# Patient Record
Sex: Female | Born: 1945
Health system: Southern US, Community
[De-identification: ages and names within clinical notes are randomized; demographics above are authoritative.]

## PROBLEM LIST (undated history)

## (undated) DIAGNOSIS — F329 Major depressive disorder, single episode, unspecified: Secondary | ICD-10-CM

## (undated) DIAGNOSIS — E785 Hyperlipidemia, unspecified: Secondary | ICD-10-CM

## (undated) DIAGNOSIS — I10 Essential (primary) hypertension: Secondary | ICD-10-CM

## (undated) DIAGNOSIS — Z8639 Personal history of other endocrine, nutritional and metabolic disease: Secondary | ICD-10-CM

## (undated) DIAGNOSIS — G620 Drug-induced polyneuropathy: Secondary | ICD-10-CM

## (undated) DIAGNOSIS — F419 Anxiety disorder, unspecified: Secondary | ICD-10-CM

## (undated) DIAGNOSIS — C50919 Malignant neoplasm of unspecified site of unspecified female breast: Secondary | ICD-10-CM

## (undated) DIAGNOSIS — C801 Malignant (primary) neoplasm, unspecified: Secondary | ICD-10-CM

## (undated) DIAGNOSIS — I252 Old myocardial infarction: Secondary | ICD-10-CM

## (undated) DIAGNOSIS — T451X5A Adverse effect of antineoplastic and immunosuppressive drugs, initial encounter: Secondary | ICD-10-CM

## (undated) DIAGNOSIS — Z9221 Personal history of antineoplastic chemotherapy: Secondary | ICD-10-CM

## (undated) DIAGNOSIS — I2510 Atherosclerotic heart disease of native coronary artery without angina pectoris: Secondary | ICD-10-CM

## (undated) DIAGNOSIS — Z923 Personal history of irradiation: Secondary | ICD-10-CM

## (undated) DIAGNOSIS — R739 Hyperglycemia, unspecified: Secondary | ICD-10-CM

## (undated) DIAGNOSIS — F32A Depression, unspecified: Secondary | ICD-10-CM

## (undated) HISTORY — DX: Personal history of other endocrine, nutritional and metabolic disease: Z86.39

## (undated) HISTORY — DX: Adverse effect of antineoplastic and immunosuppressive drugs, initial encounter: T45.1X5A

## (undated) HISTORY — DX: Old myocardial infarction: I25.2

## (undated) HISTORY — PX: ABDOMINAL HYSTERECTOMY: SHX81

## (undated) HISTORY — DX: Depression, unspecified: F32.A

## (undated) HISTORY — DX: Hyperglycemia, unspecified: R73.9

## (undated) HISTORY — DX: Drug-induced polyneuropathy: G62.0

## (undated) HISTORY — PX: CHOLECYSTECTOMY: SHX55

## (undated) HISTORY — DX: Anxiety disorder, unspecified: F41.9

## (undated) HISTORY — DX: Malignant neoplasm of unspecified site of unspecified female breast: C50.919

## (undated) HISTORY — DX: Personal history of antineoplastic chemotherapy: Z92.21

## (undated) HISTORY — DX: Essential (primary) hypertension: I10

## (undated) HISTORY — DX: Malignant (primary) neoplasm, unspecified: C80.1

## (undated) HISTORY — DX: Major depressive disorder, single episode, unspecified: F32.9

## (undated) HISTORY — DX: Hyperlipidemia, unspecified: E78.5

## (undated) HISTORY — DX: Atherosclerotic heart disease of native coronary artery without angina pectoris: I25.10

---

## 1999-07-28 DIAGNOSIS — Z923 Personal history of irradiation: Secondary | ICD-10-CM

## 1999-07-28 HISTORY — DX: Personal history of irradiation: Z92.3

## 2006-07-27 HISTORY — PX: CORONARY ANGIOPLASTY WITH STENT PLACEMENT: SHX49

## 2006-07-27 HISTORY — PX: BREAST BIOPSY: SHX20

## 2006-09-21 ENCOUNTER — Other Ambulatory Visit: Payer: Self-pay

## 2006-09-21 ENCOUNTER — Inpatient Hospital Stay: Payer: Self-pay | Admitting: Internal Medicine

## 2006-09-22 ENCOUNTER — Other Ambulatory Visit: Payer: Self-pay

## 2009-07-27 DIAGNOSIS — Z9221 Personal history of antineoplastic chemotherapy: Secondary | ICD-10-CM

## 2009-07-27 HISTORY — DX: Personal history of antineoplastic chemotherapy: Z92.21

## 2010-02-24 ENCOUNTER — Ambulatory Visit: Payer: Self-pay | Admitting: Internal Medicine

## 2010-03-05 ENCOUNTER — Ambulatory Visit: Payer: Self-pay | Admitting: Family Medicine

## 2010-03-05 HISTORY — PX: BREAST BIOPSY: SHX20

## 2010-03-21 ENCOUNTER — Ambulatory Visit: Payer: Self-pay | Admitting: Internal Medicine

## 2010-03-22 LAB — CANCER ANTIGEN 27.29: CA 27.29: 15.4 U/mL (ref 0.0–38.6)

## 2010-03-25 ENCOUNTER — Ambulatory Visit: Payer: Self-pay | Admitting: Surgery

## 2010-03-25 ENCOUNTER — Ambulatory Visit: Payer: Self-pay | Admitting: Internal Medicine

## 2010-03-26 ENCOUNTER — Ambulatory Visit: Payer: Self-pay | Admitting: Surgery

## 2010-03-27 ENCOUNTER — Ambulatory Visit: Payer: Self-pay | Admitting: Internal Medicine

## 2010-04-26 ENCOUNTER — Ambulatory Visit: Payer: Self-pay | Admitting: Internal Medicine

## 2010-05-27 ENCOUNTER — Ambulatory Visit: Payer: Self-pay | Admitting: Internal Medicine

## 2010-06-26 ENCOUNTER — Ambulatory Visit: Payer: Self-pay | Admitting: Internal Medicine

## 2010-07-07 ENCOUNTER — Ambulatory Visit: Payer: Self-pay | Admitting: Surgery

## 2010-07-14 ENCOUNTER — Ambulatory Visit: Payer: Self-pay | Admitting: Surgery

## 2010-07-14 HISTORY — PX: BREAST LUMPECTOMY: SHX2

## 2010-07-23 LAB — PATHOLOGY REPORT

## 2010-07-27 ENCOUNTER — Ambulatory Visit: Payer: Self-pay | Admitting: Internal Medicine

## 2010-08-27 ENCOUNTER — Ambulatory Visit: Payer: Self-pay | Admitting: Internal Medicine

## 2010-09-25 ENCOUNTER — Ambulatory Visit: Payer: Self-pay | Admitting: Internal Medicine

## 2010-10-26 ENCOUNTER — Ambulatory Visit: Payer: Self-pay | Admitting: Internal Medicine

## 2010-11-25 ENCOUNTER — Ambulatory Visit: Payer: Self-pay | Admitting: Internal Medicine

## 2010-12-26 ENCOUNTER — Ambulatory Visit: Payer: Self-pay | Admitting: Internal Medicine

## 2011-01-27 ENCOUNTER — Ambulatory Visit: Payer: Self-pay | Admitting: Internal Medicine

## 2011-02-25 ENCOUNTER — Ambulatory Visit: Payer: Self-pay | Admitting: Internal Medicine

## 2011-03-28 ENCOUNTER — Ambulatory Visit: Payer: Self-pay | Admitting: Internal Medicine

## 2011-04-27 ENCOUNTER — Ambulatory Visit: Payer: Self-pay | Admitting: Internal Medicine

## 2011-06-02 ENCOUNTER — Ambulatory Visit: Payer: Self-pay | Admitting: Internal Medicine

## 2011-06-27 ENCOUNTER — Ambulatory Visit: Payer: Self-pay | Admitting: Internal Medicine

## 2011-07-28 ENCOUNTER — Ambulatory Visit: Payer: Self-pay | Admitting: Internal Medicine

## 2011-08-28 ENCOUNTER — Ambulatory Visit: Payer: Self-pay | Admitting: Internal Medicine

## 2011-09-28 ENCOUNTER — Ambulatory Visit: Payer: Self-pay | Admitting: Internal Medicine

## 2011-10-26 ENCOUNTER — Ambulatory Visit: Payer: Self-pay | Admitting: Internal Medicine

## 2011-11-16 LAB — HEPATIC FUNCTION PANEL A (ARMC)
Albumin: 3.5 g/dL (ref 3.4–5.0)
Alkaline Phosphatase: 109 U/L (ref 50–136)
Bilirubin,Total: 0.2 mg/dL (ref 0.2–1.0)
Total Protein: 7.1 g/dL (ref 6.4–8.2)

## 2011-11-16 LAB — CBC CANCER CENTER
Basophil %: 1.2 %
HGB: 12.5 g/dL (ref 12.0–16.0)
MCHC: 32.7 g/dL (ref 32.0–36.0)
Neutrophil %: 51.2 %
Platelet: 243 x10 3/mm (ref 150–440)
RBC: 4.08 10*6/uL (ref 3.80–5.20)
WBC: 5.7 x10 3/mm (ref 3.6–11.0)

## 2011-11-16 LAB — CREATININE, SERUM
Creatinine: 0.86 mg/dL (ref 0.60–1.30)
EGFR (African American): >60
EGFR (Non-African Amer.): >60

## 2011-11-17 LAB — CANCER ANTIGEN 27.29: CA 27.29: 22.9 U/mL (ref 0.0–38.6)

## 2011-11-25 ENCOUNTER — Ambulatory Visit: Payer: Self-pay | Admitting: Internal Medicine

## 2012-03-14 ENCOUNTER — Ambulatory Visit: Payer: Self-pay | Admitting: Internal Medicine

## 2012-03-27 ENCOUNTER — Ambulatory Visit: Payer: Self-pay | Admitting: Internal Medicine

## 2012-04-07 ENCOUNTER — Ambulatory Visit: Payer: Self-pay | Admitting: Internal Medicine

## 2012-04-12 ENCOUNTER — Ambulatory Visit: Payer: Self-pay | Admitting: Internal Medicine

## 2012-04-12 LAB — CBC CANCER CENTER
Basophil #: 0.1 x10 3/mm (ref 0.0–0.1)
Basophil %: 1.1 %
Eosinophil #: 0.2 x10 3/mm (ref 0.0–0.7)
HCT: 39.6 % (ref 35.0–47.0)
HGB: 12.6 g/dL (ref 12.0–16.0)
Lymphocyte %: 29.3 %
MCH: 29.9 pg (ref 26.0–34.0)
MCHC: 31.8 g/dL — ABNORMAL LOW (ref 32.0–36.0)
Monocyte #: 0.4 x10 3/mm (ref 0.2–0.9)
Neutrophil #: 3.7 x10 3/mm (ref 1.4–6.5)
Neutrophil %: 59.8 %
Platelet: 290 x10 3/mm (ref 150–440)
WBC: 6.2 x10 3/mm (ref 3.6–11.0)

## 2012-04-12 LAB — HEPATIC FUNCTION PANEL A (ARMC)
Albumin: 3.6 g/dL (ref 3.4–5.0)
Alkaline Phosphatase: 124 U/L (ref 50–136)
Bilirubin, Direct: 0.1 mg/dL (ref 0.00–0.20)
Bilirubin,Total: 0.3 mg/dL (ref 0.2–1.0)
SGOT(AST): 16 U/L (ref 15–37)
SGPT (ALT): 21 U/L (ref 12–78)
Total Protein: 7.9 g/dL (ref 6.4–8.2)

## 2012-04-12 LAB — CREATININE, SERUM
Creatinine: 0.87 mg/dL (ref 0.60–1.30)
EGFR (African American): >60
EGFR (Non-African Amer.): >60

## 2012-04-13 LAB — CANCER ANTIGEN 27.29: CA 27.29: 31.2 U/mL (ref 0.0–38.6)

## 2012-04-26 ENCOUNTER — Ambulatory Visit: Payer: Self-pay | Admitting: Internal Medicine

## 2012-05-19 ENCOUNTER — Ambulatory Visit: Payer: Self-pay | Admitting: Gastroenterology

## 2012-07-27 ENCOUNTER — Ambulatory Visit: Payer: Self-pay | Admitting: Internal Medicine

## 2012-08-12 LAB — CREATININE, SERUM: EGFR (Non-African Amer.): >60

## 2012-08-12 LAB — HEPATIC FUNCTION PANEL A (ARMC)
Albumin: 3.4 g/dL (ref 3.4–5.0)
Alkaline Phosphatase: 142 U/L — ABNORMAL HIGH (ref 50–136)
Bilirubin,Total: 0.2 mg/dL (ref 0.2–1.0)
SGOT(AST): 16 U/L (ref 15–37)
SGPT (ALT): 19 U/L (ref 12–78)

## 2012-08-12 LAB — CBC CANCER CENTER
Eosinophil #: 0.3 x10 3/mm (ref 0.0–0.7)
HCT: 41.1 % (ref 35.0–47.0)
HGB: 13.9 g/dL (ref 12.0–16.0)
Lymphocyte %: 31.4 %
MCH: 30.6 pg (ref 26.0–34.0)
MCHC: 33.8 g/dL (ref 32.0–36.0)
MCV: 91 fL (ref 80–100)
Monocyte %: 7.1 %
Neutrophil #: 4 x10 3/mm (ref 1.4–6.5)
Neutrophil %: 56.8 %
Platelet: 257 x10 3/mm (ref 150–440)
RDW: 14.3 % (ref 11.5–14.5)

## 2012-08-13 LAB — CANCER ANTIGEN 27.29: CA 27.29: 22.4 U/mL (ref 0.0–38.6)

## 2012-08-27 ENCOUNTER — Ambulatory Visit: Payer: Self-pay | Admitting: Internal Medicine

## 2012-10-19 ENCOUNTER — Ambulatory Visit: Payer: Self-pay | Admitting: Internal Medicine

## 2013-02-24 ENCOUNTER — Ambulatory Visit: Payer: Self-pay | Admitting: Internal Medicine

## 2013-03-01 LAB — CBC CANCER CENTER
Basophil #: 0.1 x10 3/mm (ref 0.0–0.1)
Basophil %: 1.2 %
Eosinophil %: 2.7 %
HCT: 42 % (ref 35.0–47.0)
Lymphocyte %: 36 %
MCH: 30.9 pg (ref 26.0–34.0)
MCV: 92 fL (ref 80–100)
Monocyte #: 0.6 x10 3/mm (ref 0.2–0.9)
Neutrophil #: 4.2 x10 3/mm (ref 1.4–6.5)
Platelet: 273 x10 3/mm (ref 150–440)
RBC: 4.58 10*6/uL (ref 3.80–5.20)

## 2013-03-01 LAB — GLUCOSE, RANDOM: Glucose: 134 mg/dL — ABNORMAL HIGH (ref 65–99)

## 2013-03-01 LAB — HEPATIC FUNCTION PANEL A (ARMC)
Bilirubin, Direct: 0.1 mg/dL (ref 0.00–0.20)
Bilirubin,Total: 0.3 mg/dL (ref 0.2–1.0)
SGOT(AST): 13 U/L — ABNORMAL LOW (ref 15–37)
Total Protein: 7.7 g/dL (ref 6.4–8.2)

## 2013-03-01 LAB — CREATININE, SERUM
Creatinine: 1.4 mg/dL — ABNORMAL HIGH (ref 0.60–1.30)
EGFR (Non-African Amer.): 39 — ABNORMAL LOW

## 2013-03-27 ENCOUNTER — Ambulatory Visit: Payer: Self-pay | Admitting: Internal Medicine

## 2013-04-26 ENCOUNTER — Ambulatory Visit: Payer: Self-pay | Admitting: Internal Medicine

## 2013-08-31 ENCOUNTER — Ambulatory Visit: Payer: Self-pay | Admitting: Internal Medicine

## 2013-09-01 LAB — HEPATIC FUNCTION PANEL A (ARMC)
ALBUMIN: 3.6 g/dL (ref 3.4–5.0)
ALK PHOS: 112 U/L
ALT: 16 U/L (ref 12–78)
Bilirubin, Direct: 0.1 mg/dL (ref 0.00–0.20)
Bilirubin,Total: 0.2 mg/dL (ref 0.2–1.0)
SGOT(AST): 12 U/L — ABNORMAL LOW (ref 15–37)
TOTAL PROTEIN: 7.6 g/dL (ref 6.4–8.2)

## 2013-09-01 LAB — CBC CANCER CENTER
BASOS ABS: 0.1 x10 3/mm (ref 0.0–0.1)
Basophil %: 1.1 %
EOS ABS: 0.4 x10 3/mm (ref 0.0–0.7)
Eosinophil %: 4.7 %
HCT: 42.1 % (ref 35.0–47.0)
HGB: 13.7 g/dL (ref 12.0–16.0)
LYMPHS PCT: 31.1 %
Lymphocyte #: 2.5 x10 3/mm (ref 1.0–3.6)
MCH: 30.3 pg (ref 26.0–34.0)
MCHC: 32.6 g/dL (ref 32.0–36.0)
MCV: 93 fL (ref 80–100)
MONO ABS: 0.5 x10 3/mm (ref 0.2–0.9)
MONOS PCT: 6.8 %
NEUTROS PCT: 56.3 %
Neutrophil #: 4.5 x10 3/mm (ref 1.4–6.5)
Platelet: 270 x10 3/mm (ref 150–440)
RBC: 4.53 10*6/uL (ref 3.80–5.20)
RDW: 14.3 % (ref 11.5–14.5)
WBC: 8.1 x10 3/mm (ref 3.6–11.0)

## 2013-09-01 LAB — CREATININE, SERUM
CREATININE: 1.04 mg/dL (ref 0.60–1.30)
EGFR (African American): >60
EGFR (Non-African Amer.): 56 — ABNORMAL LOW

## 2013-09-02 LAB — CANCER ANTIGEN 27.29: CA 27.29: 25.4 U/mL (ref 0.0–38.6)

## 2013-09-24 ENCOUNTER — Ambulatory Visit: Payer: Self-pay | Admitting: Internal Medicine

## 2013-10-25 ENCOUNTER — Ambulatory Visit: Payer: Self-pay | Admitting: Internal Medicine

## 2014-03-02 ENCOUNTER — Ambulatory Visit: Payer: Self-pay | Admitting: Internal Medicine

## 2014-03-02 LAB — CBC CANCER CENTER
Basophil #: 0.1 x10 3/mm (ref 0.0–0.1)
Basophil %: 0.8 %
EOS ABS: 0.2 x10 3/mm (ref 0.0–0.7)
Eosinophil %: 3.1 %
HCT: 39.7 % (ref 35.0–47.0)
HGB: 13.2 g/dL (ref 12.0–16.0)
LYMPHS ABS: 2.4 x10 3/mm (ref 1.0–3.6)
LYMPHS PCT: 32 %
MCH: 31.2 pg (ref 26.0–34.0)
MCHC: 33.4 g/dL (ref 32.0–36.0)
MCV: 94 fL (ref 80–100)
MONO ABS: 0.5 x10 3/mm (ref 0.2–0.9)
MONOS PCT: 6.3 %
NEUTROS ABS: 4.3 x10 3/mm (ref 1.4–6.5)
NEUTROS PCT: 57.8 %
PLATELETS: 269 x10 3/mm (ref 150–440)
RBC: 4.24 10*6/uL (ref 3.80–5.20)
RDW: 13.4 % (ref 11.5–14.5)
WBC: 7.4 x10 3/mm (ref 3.6–11.0)

## 2014-03-02 LAB — HEPATIC FUNCTION PANEL A (ARMC)
ALK PHOS: 76 U/L
AST: 13 U/L — AB (ref 15–37)
Albumin: 3.5 g/dL (ref 3.4–5.0)
BILIRUBIN TOTAL: 0.2 mg/dL (ref 0.2–1.0)
Bilirubin, Direct: <0.1 mg/dL (ref 0.00–0.20)
SGPT (ALT): 21 U/L
Total Protein: 7.5 g/dL (ref 6.4–8.2)

## 2014-03-02 LAB — CREATININE, SERUM
Creatinine: 1.02 mg/dL (ref 0.60–1.30)
EGFR (African American): >60
GFR CALC NON AF AMER: 57 — AB

## 2014-03-06 LAB — CANCER ANTIGEN 27.29: CA 27.29: 13 U/mL (ref 0.0–38.6)

## 2014-03-27 ENCOUNTER — Ambulatory Visit: Payer: Self-pay | Admitting: Internal Medicine

## 2014-04-25 DIAGNOSIS — D649 Anemia, unspecified: Secondary | ICD-10-CM | POA: Insufficient documentation

## 2014-04-25 DIAGNOSIS — I10 Essential (primary) hypertension: Secondary | ICD-10-CM | POA: Insufficient documentation

## 2014-10-29 ENCOUNTER — Ambulatory Visit
Admit: 2014-10-29 | Disposition: A | Discharge status: Home / Self Care | Payer: Self-pay | Attending: Internal Medicine | Admitting: Internal Medicine

## 2014-10-29 DIAGNOSIS — G8929 Other chronic pain: Secondary | ICD-10-CM | POA: Insufficient documentation

## 2014-10-29 DIAGNOSIS — IMO0002 Reserved for concepts with insufficient information to code with codable children: Secondary | ICD-10-CM | POA: Insufficient documentation

## 2014-10-29 DIAGNOSIS — I10 Essential (primary) hypertension: Secondary | ICD-10-CM | POA: Insufficient documentation

## 2014-10-29 DIAGNOSIS — Z9181 History of falling: Secondary | ICD-10-CM | POA: Insufficient documentation

## 2014-10-29 DIAGNOSIS — R739 Hyperglycemia, unspecified: Secondary | ICD-10-CM | POA: Insufficient documentation

## 2014-10-29 DIAGNOSIS — D492 Neoplasm of unspecified behavior of bone, soft tissue, and skin: Secondary | ICD-10-CM | POA: Insufficient documentation

## 2014-10-29 DIAGNOSIS — G64 Other disorders of peripheral nervous system: Secondary | ICD-10-CM | POA: Insufficient documentation

## 2014-10-29 DIAGNOSIS — Z1331 Encounter for screening for depression: Secondary | ICD-10-CM | POA: Insufficient documentation

## 2014-10-29 DIAGNOSIS — R7303 Prediabetes: Secondary | ICD-10-CM | POA: Insufficient documentation

## 2014-10-29 DIAGNOSIS — F419 Anxiety disorder, unspecified: Secondary | ICD-10-CM | POA: Insufficient documentation

## 2014-10-29 DIAGNOSIS — C50919 Malignant neoplasm of unspecified site of unspecified female breast: Secondary | ICD-10-CM | POA: Insufficient documentation

## 2014-10-29 DIAGNOSIS — I2510 Atherosclerotic heart disease of native coronary artery without angina pectoris: Secondary | ICD-10-CM | POA: Insufficient documentation

## 2014-10-29 DIAGNOSIS — E785 Hyperlipidemia, unspecified: Secondary | ICD-10-CM | POA: Insufficient documentation

## 2014-10-29 DIAGNOSIS — R946 Abnormal results of thyroid function studies: Secondary | ICD-10-CM | POA: Insufficient documentation

## 2014-10-29 DIAGNOSIS — M542 Cervicalgia: Secondary | ICD-10-CM

## 2014-12-13 ENCOUNTER — Other Ambulatory Visit: Payer: Self-pay | Admitting: *Deleted

## 2014-12-13 NOTE — Telephone Encounter (Signed)
Will pick up 12/14/14

## 2014-12-14 MED ORDER — HYDROCODONE-ACETAMINOPHEN 5-325 MG PO TABS: 1.0000 | ORAL_TABLET | Freq: Every evening | ORAL | Status: DC | PRN

## 2014-12-27 ENCOUNTER — Encounter (INDEPENDENT_AMBULATORY_CARE_PROVIDER_SITE_OTHER): Payer: Self-pay

## 2014-12-27 ENCOUNTER — Ambulatory Visit (INDEPENDENT_AMBULATORY_CARE_PROVIDER_SITE_OTHER): Payer: Medicare Other | Admitting: Family Medicine

## 2014-12-27 ENCOUNTER — Encounter: Payer: Self-pay | Admitting: Family Medicine

## 2014-12-27 VITALS — BP 130/80 | HR 78 | Resp 15 | Ht 64.0 in | Wt 201.2 lb

## 2014-12-27 DIAGNOSIS — R7309 Other abnormal glucose: Secondary | ICD-10-CM | POA: Diagnosis not present

## 2014-12-27 DIAGNOSIS — F411 Generalized anxiety disorder: Secondary | ICD-10-CM

## 2014-12-27 DIAGNOSIS — T502X5A Adverse effect of carbonic-anhydrase inhibitors, benzothiadiazides and other diuretics, initial encounter: Secondary | ICD-10-CM | POA: Diagnosis not present

## 2014-12-27 DIAGNOSIS — E876 Hypokalemia: Secondary | ICD-10-CM | POA: Diagnosis not present

## 2014-12-27 DIAGNOSIS — I10 Essential (primary) hypertension: Secondary | ICD-10-CM

## 2014-12-27 MED ORDER — LOSARTAN POTASSIUM 100 MG PO TABS: 100.0000 mg | ORAL_TABLET | Freq: Every day | ORAL | Status: DC

## 2014-12-27 MED ORDER — AMLODIPINE BESYLATE 10 MG PO TABS: 10.0000 mg | ORAL_TABLET | Freq: Every day | ORAL | Status: DC

## 2014-12-27 MED ORDER — TRIAMTERENE-HCTZ 37.5-25 MG PO TABS: 1.0000 | ORAL_TABLET | Freq: Every day | ORAL | Status: DC

## 2014-12-27 MED ORDER — PAROXETINE HCL 20 MG PO TABS: 20.0000 mg | ORAL_TABLET | Freq: Every day | ORAL | Status: DC

## 2014-12-27 MED ORDER — POTASSIUM CHLORIDE CRYS ER 20 MEQ PO TBCR: 20.0000 meq | EXTENDED_RELEASE_TABLET | Freq: Every day | ORAL | Status: DC

## 2014-12-27 NOTE — Progress Notes (Signed)
Subjective:    Patient ID: Theresa Andrews, female    DOB: [birth year], 69 y.o.   MRN: [id number]  Hypertension This is a chronic problem. The current episode started more than 1 year ago. The problem is controlled. Associated symptoms include anxiety. Pertinent negatives include no blurred vision, chest pain, headaches, orthopnea, palpitations or shortness of breath. Risk factors for coronary artery disease include diabetes mellitus and dyslipidemia. Past treatments include angiotensin blockers, calcium channel blockers and diuretics. The current treatment provides significant improvement. There are no compliance problems.  Hypertensive end-organ damage includes CAD/MI. There is no history of CVA or heart failure.  Anxiety Onset was 1 to 5 years ago. The problem has been gradually improving. Symptoms include excessive worry, nervous/anxious behavior and panic. Patient reports no chest pain, depressed mood, dizziness, palpitations or shortness of breath. Symptoms occur occasionally. The severity of symptoms is moderate. The quality of sleep is good.   Past treatments include SSRIs and benzodiazephines. Compliance with medications is 76-100%.   Past Medical History  Diagnosis Date  . Depression   . Hyperlipidemia   . Hypertension   . Anxiety    Past Surgical History  Procedure Laterality Date  . Cholecystectomy    . Abdominal hysterectomy    . Coronary angioplasty with stent placement  2008   Family History  Problem Relation Age of Onset  . Cancer Sister     Breast Cancer  . Hypertension Mother   . Hypertension Father    History   Social History  . Marital Status: Divorced    Spouse Name: N/A  . Number of Children: N/A  . Years of Education: N/A   Occupational History  . Not on file.   Social History Main Topics  . Smoking status: Former Research scientist (life sciences)  . Smokeless tobacco: Not on file  . Alcohol Use: No  . Drug Use: No  . Sexual Activity: Not on file   Other Topics Concern  .  Not on file   Social History Narrative     Filed Vitals:   12/27/14 1015  BP: 130/80  Pulse: 78  Resp: 15     Review of Systems  Constitutional: Negative for fever, chills and fatigue.  Eyes: Negative for blurred vision.  Respiratory: Negative for shortness of breath.   Cardiovascular: Negative for chest pain, palpitations and orthopnea.  Neurological: Negative for dizziness and headaches.  Psychiatric/Behavioral: Negative for sleep disturbance. The patient is nervous/anxious.        Objective:   Physical Exam  Constitutional: She is oriented to person, place, and time. She appears well-developed and well-nourished.  Cardiovascular: Normal rate and regular rhythm.   Pulmonary/Chest: Effort normal.  Neurological: She is alert and oriented to person, place, and time.  Psychiatric: She has a normal mood and affect. Her behavior is normal. Judgment and thought content normal.  Nursing note and vitals reviewed.         Assessment & Plan:  1. Essential hypertension  - losartan (COZAAR) 100 MG tablet; Take 1 tablet (100 mg total) by mouth daily.  Dispense: 90 tablet; Refill: 0 - amLODipine (NORVASC) 10 MG tablet; Take 1 tablet (10 mg total) by mouth daily.  Dispense: 90 tablet; Refill: 0 - triamterene-hydrochlorothiazide (MAXZIDE-25) 37.5-25 MG per tablet; Take 1 tablet by mouth daily. 37.5-25  Dispense: 90 tablet; Refill: 0  2. Elevated hemoglobin A1c measurement  - HgB A1c  3. Diuretic-induced hypokalemia  - Basic Metabolic Panel (BMET) - potassium chloride SA (K-DUR,KLOR-CON) 20 MEQ  tablet; Take 1 tablet (20 mEq total) by mouth daily.  Dispense: 90 tablet; Refill: 0  4. GAD (generalized anxiety disorder)  - PARoxetine (PAXIL) 20 MG tablet; Take 1 tablet (20 mg total) by mouth daily.

## 2015-01-29 ENCOUNTER — Other Ambulatory Visit: Payer: Self-pay | Admitting: *Deleted

## 2015-01-29 MED ORDER — HYDROCODONE-ACETAMINOPHEN 5-325 MG PO TABS: 1.0000 | ORAL_TABLET | Freq: Every evening | ORAL | Status: DC | PRN

## 2015-02-25 ENCOUNTER — Other Ambulatory Visit: Payer: Self-pay | Admitting: Family Medicine

## 2015-02-25 DIAGNOSIS — F411 Generalized anxiety disorder: Secondary | ICD-10-CM

## 2015-02-25 DIAGNOSIS — I10 Essential (primary) hypertension: Secondary | ICD-10-CM

## 2015-02-25 NOTE — Telephone Encounter (Signed)
Patient is requesting a refill on Paroxetine 20mg  and Triant/HCTZ 37.5/25mg . Please send to [facility]. Patient is completely out

## 2015-02-26 NOTE — Telephone Encounter (Signed)
Medication has been refilled.

## 2015-02-27 ENCOUNTER — Telehealth: Payer: Self-pay | Admitting: Family Medicine

## 2015-02-27 DIAGNOSIS — F411 Generalized anxiety disorder: Secondary | ICD-10-CM

## 2015-02-27 DIAGNOSIS — I10 Essential (primary) hypertension: Secondary | ICD-10-CM

## 2015-02-27 MED ORDER — TRIAMTERENE-HCTZ 37.5-25 MG PO TABS: 1.0000 | ORAL_TABLET | Freq: Every day | ORAL | Status: DC

## 2015-02-27 MED ORDER — PAROXETINE HCL 20 MG PO TABS: 20.0000 mg | ORAL_TABLET | Freq: Every day | ORAL | Status: DC

## 2015-02-27 MED ORDER — PAROXETINE HCL 20 MG PO TABS
20.0000 mg | ORAL_TABLET | Freq: Every day | ORAL | Status: DC
Start: 2015-02-27 — End: 2015-04-29

## 2015-02-27 NOTE — Telephone Encounter (Signed)
Medication has been refilled and sent to [facility] [address]

## 2015-03-07 ENCOUNTER — Other Ambulatory Visit: Payer: Self-pay | Admitting: *Deleted

## 2015-03-07 DIAGNOSIS — C50912 Malignant neoplasm of unspecified site of left female breast: Secondary | ICD-10-CM

## 2015-03-08 ENCOUNTER — Inpatient Hospital Stay: Payer: Medicare Other | Attending: Internal Medicine

## 2015-03-08 ENCOUNTER — Inpatient Hospital Stay: Payer: Medicare Other | Admitting: Internal Medicine

## 2015-03-25 ENCOUNTER — Telehealth: Payer: Self-pay | Admitting: *Deleted

## 2015-03-25 ENCOUNTER — Other Ambulatory Visit: Payer: Self-pay | Admitting: Internal Medicine

## 2015-03-25 DIAGNOSIS — C50912 Malignant neoplasm of unspecified site of left female breast: Secondary | ICD-10-CM

## 2015-03-25 MED ORDER — HYDROCODONE-ACETAMINOPHEN 5-325 MG PO TABS: 1.0000 | ORAL_TABLET | Freq: Every evening | ORAL | Status: DC | PRN

## 2015-03-25 NOTE — Telephone Encounter (Signed)
Hydrocodone/-acetaminophen 5/235 RX renewed by Dr. Ma Hillock. Left msgs-notified pt that RX is ready to be picked up at [facility]. Pt or caregiver must bring photo id to pick up rx.

## 2015-03-25 NOTE — Telephone Encounter (Signed)
Dr. Ma Hillock, pt Left msg requesting RF on hydrocodone. Last filled 01/29/15.  May I get an order to rf this. Thanks.

## 2015-03-29 ENCOUNTER — Inpatient Hospital Stay (HOSPITAL_BASED_OUTPATIENT_CLINIC_OR_DEPARTMENT_OTHER): Payer: Medicare Other | Admitting: Internal Medicine

## 2015-03-29 ENCOUNTER — Inpatient Hospital Stay: Payer: Medicare Other | Attending: Internal Medicine

## 2015-03-29 ENCOUNTER — Other Ambulatory Visit: Payer: Self-pay | Admitting: *Deleted

## 2015-03-29 VITALS — BP 134/85 | HR 79 | Temp 96.8°F | Resp 18 | Ht 64.0 in | Wt 199.1 lb

## 2015-03-29 DIAGNOSIS — E785 Hyperlipidemia, unspecified: Secondary | ICD-10-CM

## 2015-03-29 DIAGNOSIS — I252 Old myocardial infarction: Secondary | ICD-10-CM | POA: Diagnosis not present

## 2015-03-29 DIAGNOSIS — C50912 Malignant neoplasm of unspecified site of left female breast: Secondary | ICD-10-CM

## 2015-03-29 DIAGNOSIS — C50812 Malignant neoplasm of overlapping sites of left female breast: Secondary | ICD-10-CM | POA: Insufficient documentation

## 2015-03-29 DIAGNOSIS — I10 Essential (primary) hypertension: Secondary | ICD-10-CM | POA: Diagnosis not present

## 2015-03-29 DIAGNOSIS — Z170 Estrogen receptor positive status [ER+]: Secondary | ICD-10-CM | POA: Diagnosis not present

## 2015-03-29 DIAGNOSIS — Z95818 Presence of other cardiac implants and grafts: Secondary | ICD-10-CM | POA: Insufficient documentation

## 2015-03-29 DIAGNOSIS — Z79810 Long term (current) use of selective estrogen receptor modulators (SERMs): Secondary | ICD-10-CM

## 2015-03-29 DIAGNOSIS — Z79899 Other long term (current) drug therapy: Secondary | ICD-10-CM | POA: Insufficient documentation

## 2015-03-29 DIAGNOSIS — Z9221 Personal history of antineoplastic chemotherapy: Secondary | ICD-10-CM | POA: Diagnosis not present

## 2015-03-29 DIAGNOSIS — I2510 Atherosclerotic heart disease of native coronary artery without angina pectoris: Secondary | ICD-10-CM

## 2015-03-29 DIAGNOSIS — Z7982 Long term (current) use of aspirin: Secondary | ICD-10-CM | POA: Diagnosis not present

## 2015-03-29 DIAGNOSIS — Z87891 Personal history of nicotine dependence: Secondary | ICD-10-CM | POA: Diagnosis not present

## 2015-03-29 DIAGNOSIS — F419 Anxiety disorder, unspecified: Secondary | ICD-10-CM

## 2015-03-29 LAB — CBC WITH DIFFERENTIAL/PLATELET
Basophils Absolute: 0 10*3/uL (ref 0–0.1)
Basophils Relative: 0 %
EOS ABS: 0.2 10*3/uL (ref 0–0.7)
Eosinophils Relative: 5 %
HEMATOCRIT: 42.3 % (ref 35.0–47.0)
HEMOGLOBIN: 14.1 g/dL (ref 12.0–16.0)
LYMPHS ABS: 1.5 10*3/uL (ref 1.0–3.6)
LYMPHS PCT: 34 %
MCH: 30.1 pg (ref 26.0–34.0)
MCHC: 33.4 g/dL (ref 32.0–36.0)
MCV: 90.2 fL (ref 80.0–100.0)
MONOS PCT: 10 %
Monocytes Absolute: 0.4 10*3/uL (ref 0.2–0.9)
NEUTROS ABS: 2.3 10*3/uL (ref 1.4–6.5)
NEUTROS PCT: 51 %
Platelets: 248 10*3/uL (ref 150–440)
RBC: 4.69 MIL/uL (ref 3.80–5.20)
RDW: 13.5 % (ref 11.5–14.5)
WBC: 4.5 10*3/uL (ref 3.6–11.0)

## 2015-03-29 LAB — HEPATIC FUNCTION PANEL
ALBUMIN: 3.9 g/dL (ref 3.5–5.0)
ALK PHOS: 101 U/L (ref 38–126)
ALT: 15 U/L (ref 14–54)
AST: 22 U/L (ref 15–41)
Bilirubin, Direct: <0.1 mg/dL — ABNORMAL LOW (ref 0.1–0.5)
TOTAL PROTEIN: 7.8 g/dL (ref 6.5–8.1)
Total Bilirubin: 0.6 mg/dL (ref 0.3–1.2)

## 2015-03-29 LAB — CREATININE, SERUM
CREATININE: 0.89 mg/dL (ref 0.44–1.00)
GFR calc Af Amer: >60 mL/min (ref >60)
GFR calc non Af Amer: >60 mL/min (ref >60)

## 2015-03-29 NOTE — Progress Notes (Signed)
Patient is here for follow-up of breast cancer. She states that overall she has been doing fine. She states that her neuropathy is better, but it is still present. Patient states that some days it does seem to flare.

## 2015-03-30 LAB — CA 27.29 (SERIAL MONITOR): CA 27.29: 25.7 U/mL (ref 0.0–38.6)

## 2015-04-04 ENCOUNTER — Other Ambulatory Visit: Payer: Self-pay | Admitting: Family Medicine

## 2015-04-04 DIAGNOSIS — I10 Essential (primary) hypertension: Secondary | ICD-10-CM

## 2015-04-04 DIAGNOSIS — F411 Generalized anxiety disorder: Secondary | ICD-10-CM

## 2015-04-04 MED ORDER — TRIAMTERENE-HCTZ 37.5-25 MG PO TABS: 1.0000 | ORAL_TABLET | Freq: Every day | ORAL | Status: DC

## 2015-04-04 NOTE — Telephone Encounter (Signed)
Pt has not been here since 12/27/14 and needs refills on Taroxetine and Triamt/HCTZ to be sent to [facility] [address]. Pt has an appt scheduled for next week.

## 2015-04-04 NOTE — Telephone Encounter (Signed)
Triamt/HCTZ has been refilled and sent to Pharmacy, but the Paxil has been routed to Dr. [name] for approval

## 2015-04-05 ENCOUNTER — Other Ambulatory Visit: Payer: Self-pay | Admitting: Family Medicine

## 2015-04-05 DIAGNOSIS — I10 Essential (primary) hypertension: Secondary | ICD-10-CM

## 2015-04-05 NOTE — Telephone Encounter (Signed)
Medication Paroxetine (paxil) 20 mg tablet has been refused per Dr. [name], patient needs to schedule appointment

## 2015-04-09 MED ORDER — LOSARTAN POTASSIUM 100 MG PO TABS: 100.0000 mg | ORAL_TABLET | Freq: Every day | ORAL | Status: DC

## 2015-04-09 NOTE — Telephone Encounter (Signed)
Losartan 100 mg has been refilled and sent to [facility]

## 2015-04-10 ENCOUNTER — Ambulatory Visit: Payer: Medicare Other | Admitting: Family Medicine

## 2015-04-14 NOTE — Progress Notes (Signed)
[place]  Telephone:([phone]) [phone] Fax:(336) [phone]     ID: Theresa Andrews OB: [birth year]  MR#: [id number]  FKC#:[id number]  Patient Care Team: Roselee Nova, MD as PCP - General (Family Medicine)  CHIEF COMPLAINT/DIAGNOSIS:  pT2 pN0 cM0  (stage IIA) invasive mammary carcinoma of left outer breast status post lumpectomy and sentinel node study on July 14, 2010.  (got neoadjuvant chemotherapy with AC x 4 with no significant response, and was sent for surgery). Surgical pathology 07/14/10 - tumor size 2.5 cm, grade 2 invasive mammary carcinoma with prominent infiltrative pattern, margins negative.  2 sentinel lymph nodes negative for metastasis. ER positive, PR positive, HER-2/neu negative by FISH (2+ by IHC).  Patient got neoadjuvant chemotherapy with AC x 4, then completed 9 weeks of adjuvant Paclitaxel chemotherapy on 10/07/10 (stopped due to progressive peripheral neuropathy).  Started hormonal therapy with aromatase inhibitor anastrazole in May 2012. Changed to Exemestane in Aug 2013 due to side effects. Changed to Tamoxifen in Sept 2013 due to side effects.    HISTORY OF PRESENT ILLNESS:  Patient returns for continued oncology evaluation, she was seen about 6 months ago.  She is currently on tamoxifen  States that she is doing steady. States that she has been on a diet has has lost weight and is happy with her progress. Last mammogram was on April 1st, 2015.Denies any new side effects from tamoxifen. States that joint symptoms are better since changing to tamoxifen. States that she takes 2 tablets Norco at bedtime to control symptoms of peripheral neuropathy so that she can sleep.  Patient stopped lyrica in past because she didn't like the side effects.Denies difficulty in use of hands, imbalance or difficulty walking. No new cough, SOB or hemoptysis. Denies feeling new breast masses on self-exam. Denies any h/o DVT, PE, TIA, CVA or heart attack.  REVIEW OF SYSTEMS:    ROS As in HPI above. In addition, no fever, chills. No new headaches or focal weakness. No  sore throat, cough, shortness of breath, sputum, hemoptysis or chest pain. No dizziness or palpitation. No abdominal pain, constipation, diarrhea, dysuria or hematuria. No new skin rash or bleeding symptoms. No new paresthesias in extremities. No polyuria polydipsia. PS ECOG 1.  PAST MEDICAL HISTORY: Reviewed. Past Medical History  Diagnosis Date  . Depression   . Hyperlipidemia   . Hypertension   . Anxiety           Hypertension  Coronary artery disease status post myocardial infarction and cardiac stent placement          Hysterectomy  Cholecystectomy   PAST SURGICAL HISTORY: Reviewed. Past Surgical History  Procedure Laterality Date  . Cholecystectomy    . Abdominal hysterectomy    . Coronary angioplasty with stent placement  2008    FAMILY HISTORY: Reviewed. Family History  Problem Relation Age of Onset  . Cancer Sister     Breast Cancer  . Hypertension Mother   . Hypertension Father     SOCIAL HISTORY: Reviewed. Social History  Substance Use Topics  . Smoking status: Former Research scientist (life sciences)  . Smokeless tobacco: Not on file  . Alcohol Use: No    Allergies  Allergen Reactions  . Shellfish Allergy     Current Outpatient Prescriptions  Medication Sig Dispense Refill  . amLODipine (NORVASC) 10 MG tablet Take 1 tablet (10 mg total) by mouth daily. 90 tablet 0  . aspirin 81 MG EC tablet Take 81 mg by mouth daily.    Marland Kitchen  HYDROcodone-acetaminophen (NORCO/VICODIN) 5-325 MG per tablet Take 1 tablet by mouth at bedtime as needed for moderate pain. 60 tablet 0  . MULTIPLE VITAMIN PO Take by mouth daily.    Marland Kitchen PARoxetine (PAXIL) 20 MG tablet Take 1 tablet (20 mg total) by mouth daily. 30 tablet 0  . potassium chloride SA (K-DUR,KLOR-CON) 20 MEQ tablet Take 1 tablet (20 mEq total) by mouth daily. 90 tablet 0  . pravastatin (PRAVACHOL) 20 MG tablet Take 20 mg by mouth once.    . tamoxifen  (NOLVADEX) 10 MG tablet Take 10 mg by mouth.    . losartan (COZAAR) 100 MG tablet Take 1 tablet (100 mg total) by mouth daily. 30 tablet 0  . triamterene-hydrochlorothiazide (MAXZIDE-25) 37.5-25 MG per tablet Take 1 tablet by mouth daily. 37.5-25 30 tablet 0   No current facility-administered medications for this visit.    PHYSICAL EXAM: Filed Vitals:   03/29/15 1028  BP: 134/85  Pulse: 79  Temp: 96.8 F (36 C)  Resp: 18     Body mass index is 34.15 kg/(m^2).    ECOG FS:1 - Symptomatic but completely ambulatory  GENERAL: Alert and oriented and in no acute distress. There is no icterus. HEENT: EOMs intact. No cervical lymphadenopathy. CVS: S1S2, regular LUNGS: Bilaterally clear to auscultation, no rhonchi. ABDOMEN: Soft, nontender. No hepatomegaly clinically.  NEURO: grossly nonfocal, cranial nerves are intact.   EXTREMITIES: No pedal edema. BREASTS: Right breast negative for masses. Continues to have large area of palpable mass-like scar tissue in left breast, clinically seems unchanged.  No axillary adenopathy on either side. Exam performed in presence of a nurse   LAB RESULTS:    Component Value Date/Time   GLUCOSE 134* 03/01/2013 1414   CREATININE 0.89 03/29/2015 1017   CREATININE 1.02 03/02/2014 1034   PROT 7.8 03/29/2015 1017   PROT 7.5 03/02/2014 1034   ALBUMIN 3.9 03/29/2015 1017   ALBUMIN 3.5 03/02/2014 1034   AST 22 03/29/2015 1017   AST 13* 03/02/2014 1034   ALT 15 03/29/2015 1017   ALT 21 03/02/2014 1034   ALKPHOS 101 03/29/2015 1017   ALKPHOS 76 03/02/2014 1034   BILITOT 0.6 03/29/2015 1017   BILITOT 0.2 03/02/2014 1034   GFRNONAA >60 03/29/2015 1017   GFRNONAA 57* 03/02/2014 1034   GFRAA >60 03/29/2015 1017   GFRAA >60 03/02/2014 1034    Lab Results  Component Value Date   WBC 4.5 03/29/2015   NEUTROABS 2.3 03/29/2015   HGB 14.1 03/29/2015   HCT 42.3 03/29/2015   MCV 90.2 03/29/2015   PLT 248 03/29/2015     STUDIES: 03/29/15 - CA 27.29 is  normal at 25.7.  10/29/14 - Bilateral Mammogram. IMPRESSION:  Stable post lumpectomy changes of the left breast.  RECOMMENDATION:  Recommend continued annual bilateral diagnostic mammographic evaluation.  BI-RADS CATEGORY  2: Benign.   ASSESSMENT / PLAN:   1. pT2 pN0 cM0 (stage IIA) invasive mammary carcinoma of left outer breast status post lumpectomy and sentinel node study on July 14, 2010.  (got neoadjuvant chemotherapy with AC x 4 with no significant response, and was sent for surgery), ER positive, PR positive, HER-2/neu negative by FISH (2+ by IHC)  -  Labs reviewed and d/w patient. She is overall doing steady with no clinical symptoms to suggest recurrent or metastatic breast cancer. Serum CA 27-29 level from today is normal. Chronic mass-like scar tissue is unchanged on exam today. Mammogram in April 2016 was reported unremarkable, BIRADS-2  She continues to  tolerate Tamoxifen better without new side effects or h/o thromboembolic phenomena, continue tamoxifen 20 mg PO daily. Continue Norco at bedtime as needed for neuropathic pain control. Will schedule for next surveillance mammogram in April 2017. Next MD followup at 1 year with CBC, Cr, LFT, CA 27.29.  2. BRCA test negative. 3. In between visits, patient was advised to call or come to the ER in case of side effects from tamoxifen or new breast masses felt on self-exam.  She is agreeable to above plan.     Leia Alf, MD   04/14/2015 7:28 PM

## 2015-04-29 ENCOUNTER — Telehealth: Payer: Self-pay | Admitting: *Deleted

## 2015-04-29 ENCOUNTER — Encounter: Payer: Self-pay | Admitting: Family Medicine

## 2015-04-29 ENCOUNTER — Ambulatory Visit (INDEPENDENT_AMBULATORY_CARE_PROVIDER_SITE_OTHER): Payer: Medicare Other | Admitting: Family Medicine

## 2015-04-29 VITALS — BP 132/74 | HR 90 | Temp 98.7°F | Resp 19 | Ht 64.0 in | Wt 201.4 lb

## 2015-04-29 DIAGNOSIS — I10 Essential (primary) hypertension: Secondary | ICD-10-CM

## 2015-04-29 DIAGNOSIS — T502X5A Adverse effect of carbonic-anhydrase inhibitors, benzothiadiazides and other diuretics, initial encounter: Secondary | ICD-10-CM

## 2015-04-29 DIAGNOSIS — E876 Hypokalemia: Secondary | ICD-10-CM

## 2015-04-29 DIAGNOSIS — Z23 Encounter for immunization: Secondary | ICD-10-CM

## 2015-04-29 MED ORDER — TRIAMTERENE-HCTZ 37.5-25 MG PO TABS: 1.0000 | ORAL_TABLET | Freq: Every day | ORAL | Status: DC

## 2015-04-29 MED ORDER — POTASSIUM CHLORIDE CRYS ER 20 MEQ PO TBCR: 20.0000 meq | EXTENDED_RELEASE_TABLET | Freq: Every day | ORAL | Status: DC

## 2015-04-29 MED ORDER — LOSARTAN POTASSIUM 100 MG PO TABS: 100.0000 mg | ORAL_TABLET | Freq: Every day | ORAL | Status: DC

## 2015-04-29 MED ORDER — AMLODIPINE BESYLATE 10 MG PO TABS
10.0000 mg | ORAL_TABLET | Freq: Every day | ORAL | Status: DC
Start: 2015-04-29 — End: 2015-08-20

## 2015-04-29 NOTE — Telephone Encounter (Signed)
Per chart, not time for refill, I spoke with patient who said she sometimes takes 2 tabs and that she still has some. She heard Dr Ma Hillock is leaving and was going to get refill before he left. I explained that Dr Rogue Bussing will be seeing her and will refill her meds when due. She stated well just cancel that request then and I will call back when I need it

## 2015-04-29 NOTE — Progress Notes (Signed)
Name: Theresa Andrews   MRN: [id number]    DOB: January 15, 1946   Date:04/29/2015       Progress Note  Subjective  Chief Complaint  Chief Complaint  Patient presents with  . Medication Refill  . Diabetes  . Hyperlipidemia  . Anxiety   Hypertension This is a chronic problem. The problem is unchanged. The problem is controlled. Pertinent negatives include no blurred vision, chest pain, headaches, palpitations or shortness of breath. Past treatments include angiotensin blockers, calcium channel blockers and diuretics. Hypertensive end-organ damage includes CAD/MI (MI in 2008, CAD, followed by Cardiology.). There is no history of kidney disease or CVA.   Past Medical History  Diagnosis Date  . Depression   . Hyperlipidemia   . Hypertension   . Anxiety     Past Surgical History  Procedure Laterality Date  . Cholecystectomy    . Abdominal hysterectomy    . Coronary angioplasty with stent placement  2008    Family History  Problem Relation Age of Onset  . Cancer Sister     Breast Cancer  . Hypertension Mother   . Hypertension Father     Social History   Social History  . Marital Status: Divorced    Spouse Name: N/A  . Number of Children: N/A  . Years of Education: N/A   Occupational History  . Not on file.   Social History Main Topics  . Smoking status: Former Research scientist (life sciences)  . Smokeless tobacco: Not on file  . Alcohol Use: No  . Drug Use: No  . Sexual Activity: Not on file   Other Topics Concern  . Not on file   Social History Narrative     Current outpatient prescriptions:  .  amLODipine (NORVASC) 10 MG tablet, Take 1 tablet (10 mg total) by mouth daily., Disp: 90 tablet, Rfl: 0 .  aspirin 81 MG EC tablet, Take 81 mg by mouth daily., Disp: , Rfl:  .  HYDROcodone-acetaminophen (NORCO/VICODIN) 5-325 MG per tablet, Take 1 tablet by mouth at bedtime as needed for moderate pain., Disp: 60 tablet, Rfl: 0 .  losartan (COZAAR) 100 MG tablet, Take 1 tablet (100 mg total) by  mouth daily., Disp: 30 tablet, Rfl: 0 .  MULTIPLE VITAMIN PO, Take by mouth daily., Disp: , Rfl:  .  potassium chloride SA (K-DUR,KLOR-CON) 20 MEQ tablet, Take 1 tablet (20 mEq total) by mouth daily., Disp: 90 tablet, Rfl: 0 .  pravastatin (PRAVACHOL) 20 MG tablet, Take 20 mg by mouth once., Disp: , Rfl:  .  tamoxifen (NOLVADEX) 10 MG tablet, Take 10 mg by mouth., Disp: , Rfl:  .  triamterene-hydrochlorothiazide (MAXZIDE-25) 37.5-25 MG per tablet, Take 1 tablet by mouth daily. 37.5-25, Disp: 30 tablet, Rfl: 0  Allergies  Allergen Reactions  . Shellfish Allergy    Review of Systems  Eyes: Negative for blurred vision.  Respiratory: Negative for shortness of breath.   Cardiovascular: Negative for chest pain, palpitations and leg swelling.  Neurological: Negative for headaches.   Objective  Filed Vitals:   04/29/15 1147  BP: 132/74  Pulse: 90  Temp: 98.7 F (37.1 C)  TempSrc: Oral  Resp: 19  Height: 5\' 4"  (1.626 m)  Weight: 201 lb 6.4 oz (91.354 kg)  SpO2: 97%    Physical Exam  Constitutional: She is oriented to person, place, and time and well-developed, well-nourished, and in no distress.  HENT:  Head: Normocephalic.  Eyes: Pupils are equal, round, and reactive to light.  Cardiovascular: Normal rate and  regular rhythm.   Pulmonary/Chest: Effort normal and breath sounds normal.  Abdominal: Soft. Bowel sounds are normal.  Neurological: She is alert and oriented to person, place, and time.  Nursing note and vitals reviewed.  Assessment & Plan  1. Essential hypertension  - triamterene-hydrochlorothiazide (MAXZIDE-25) 37.5-25 MG tablet; Take 1 tablet by mouth daily. 37.5-25  Dispense: 90 tablet; Refill: 0 - losartan (COZAAR) 100 MG tablet; Take 1 tablet (100 mg total) by mouth daily.  Dispense: 90 tablet; Refill: 0 - amLODipine (NORVASC) 10 MG tablet; Take 1 tablet (10 mg total) by mouth daily.  Dispense: 90 tablet; Refill: 0  2. Diuretic-induced hypokalemia  -  Comprehensive Metabolic Panel (CMET) - potassium chloride SA (K-DUR,KLOR-CON) 20 MEQ tablet; Take 1 tablet (20 mEq total) by mouth daily.  Dispense: 90 tablet; Refill: 0  3. Need for immunization against influenza  - Flu vaccine HIGH DOSE PF (Fluzone High dose)   Chanese Hartsough [name] 04/29/2015 11:55 AM

## 2015-04-30 LAB — COMPREHENSIVE METABOLIC PANEL
ALK PHOS: 100 IU/L (ref 39–117)
ALT: 8 IU/L (ref 0–32)
AST: 9 IU/L (ref 0–40)
Albumin/Globulin Ratio: 1.4 (ref 1.1–2.5)
Albumin: 4.2 g/dL (ref 3.6–4.8)
BILIRUBIN TOTAL: 0.2 mg/dL (ref 0.0–1.2)
BUN/Creatinine Ratio: 14 (ref 11–26)
BUN: 12 mg/dL (ref 8–27)
CHLORIDE: 100 mmol/L (ref 97–108)
CO2: 24 mmol/L (ref 18–29)
Calcium: 11.5 mg/dL — ABNORMAL HIGH (ref 8.7–10.3)
Creatinine, Ser: 0.85 mg/dL (ref 0.57–1.00)
GFR calc non Af Amer: 71 mL/min/{1.73_m2} (ref >59)
GFR, EST AFRICAN AMERICAN: 81 mL/min/{1.73_m2} (ref >59)
GLUCOSE: 115 mg/dL — AB (ref 65–99)
Globulin, Total: 3.1 g/dL (ref 1.5–4.5)
Potassium: 3.8 mmol/L (ref 3.5–5.2)
Sodium: 144 mmol/L (ref 134–144)
TOTAL PROTEIN: 7.3 g/dL (ref 6.0–8.5)

## 2015-05-08 ENCOUNTER — Other Ambulatory Visit: Payer: Self-pay | Admitting: *Deleted

## 2015-05-08 DIAGNOSIS — C50912 Malignant neoplasm of unspecified site of left female breast: Secondary | ICD-10-CM

## 2015-05-08 MED ORDER — HYDROCODONE-ACETAMINOPHEN 5-325 MG PO TABS: 1.0000 | ORAL_TABLET | Freq: Every evening | ORAL | Status: DC | PRN

## 2015-05-09 MED ORDER — HYDROCODONE-ACETAMINOPHEN 5-325 MG PO TABS: 1.0000 | ORAL_TABLET | Freq: Every evening | ORAL | Status: DC | PRN

## 2015-05-09 NOTE — Addendum Note (Signed)
Addended by: Betti Cruz on: 05/09/2015 02:38 PM   Modules accepted: Orders, Medications

## 2015-05-16 ENCOUNTER — Telehealth: Payer: Self-pay | Admitting: Family Medicine

## 2015-05-16 DIAGNOSIS — F419 Anxiety disorder, unspecified: Secondary | ICD-10-CM | POA: Insufficient documentation

## 2015-05-16 MED ORDER — ALPRAZOLAM 0.25 MG PO TABS: 0.2500 mg | ORAL_TABLET | Freq: Two times a day (BID) | ORAL | Status: DC | PRN

## 2015-05-16 NOTE — Telephone Encounter (Signed)
Routed to Dr. Shah for medication change 

## 2015-05-16 NOTE — Telephone Encounter (Signed)
Discussed patient's symptoms of anxiety. Previously, she was on paroxetine which was stopped because of traction with tamoxifen. Patient wishes to take something as needed. We will start on alprazolam 0.25 mg twice a day as needed for anxiety. Patient verbalized understanding. Will pick up prescription in a.m.

## 2015-05-17 NOTE — Telephone Encounter (Signed)
Dr. [name] has spoken with patient and she has picked up new prescription

## 2015-07-04 ENCOUNTER — Other Ambulatory Visit: Payer: Self-pay | Admitting: *Deleted

## 2015-07-04 DIAGNOSIS — C50912 Malignant neoplasm of unspecified site of left female breast: Secondary | ICD-10-CM

## 2015-07-04 MED ORDER — HYDROCODONE-ACETAMINOPHEN 5-325 MG PO TABS: 1.0000 | ORAL_TABLET | Freq: Every evening | ORAL | Status: DC | PRN

## 2015-07-04 NOTE — Telephone Encounter (Signed)
Informed that prescription is ready to pick up Dr B would like to see her in 2 months. She has agreed to an appt 09/04/15 at 10 am

## 2015-07-31 ENCOUNTER — Ambulatory Visit: Payer: Medicare Other | Admitting: Family Medicine

## 2015-08-20 ENCOUNTER — Ambulatory Visit (INDEPENDENT_AMBULATORY_CARE_PROVIDER_SITE_OTHER): Payer: Medicare Other | Admitting: Family Medicine

## 2015-08-20 ENCOUNTER — Encounter: Payer: Self-pay | Admitting: Family Medicine

## 2015-08-20 VITALS — BP 129/77 | HR 105 | Temp 98.6°F | Resp 20 | Ht 64.0 in | Wt 206.1 lb

## 2015-08-20 DIAGNOSIS — T502X5A Adverse effect of carbonic-anhydrase inhibitors, benzothiadiazides and other diuretics, initial encounter: Secondary | ICD-10-CM

## 2015-08-20 DIAGNOSIS — F411 Generalized anxiety disorder: Secondary | ICD-10-CM | POA: Diagnosis not present

## 2015-08-20 DIAGNOSIS — I10 Essential (primary) hypertension: Secondary | ICD-10-CM

## 2015-08-20 DIAGNOSIS — E876 Hypokalemia: Secondary | ICD-10-CM | POA: Diagnosis not present

## 2015-08-20 MED ORDER — AMLODIPINE BESYLATE 10 MG PO TABS: 10.0000 mg | ORAL_TABLET | Freq: Every day | ORAL | Status: DC

## 2015-08-20 MED ORDER — TRIAMTERENE-HCTZ 37.5-25 MG PO TABS: 1.0000 | ORAL_TABLET | Freq: Every day | ORAL | Status: DC

## 2015-08-20 MED ORDER — LOSARTAN POTASSIUM 100 MG PO TABS: 100.0000 mg | ORAL_TABLET | Freq: Every day | ORAL | Status: DC

## 2015-08-20 MED ORDER — ALPRAZOLAM 0.25 MG PO TABS: 0.2500 mg | ORAL_TABLET | Freq: Two times a day (BID) | ORAL | Status: DC | PRN

## 2015-08-20 MED ORDER — POTASSIUM CHLORIDE CRYS ER 20 MEQ PO TBCR: 20.0000 meq | EXTENDED_RELEASE_TABLET | Freq: Every day | ORAL | Status: DC

## 2015-08-20 NOTE — Progress Notes (Signed)
Name: Theresa Andrews   MRN: [id number]    DOB: August 05, 1945   Date:08/20/2015       Progress Note  Subjective  Chief Complaint  Chief Complaint  Patient presents with  . Follow-up    3 MO  . Anxiety  . Diabetes  . Hyperlipidemia  . Medication Refill    all meds    Anxiety Presents for follow-up visit. The problem has been gradually improving. Symptoms include excessive worry, insomnia (trouble falling asleep) and nervous/anxious behavior. Patient reports no chest pain, palpitations or shortness of breath.   Past treatments include benzodiazephines. Compliance with prior treatments has been good.  Hypertension This is a chronic problem. The problem is controlled. Associated symptoms include anxiety. Pertinent negatives include no chest pain, headaches, palpitations or shortness of breath. Past treatments include diuretics, calcium channel blockers and angiotensin blockers.     Past Medical History  Diagnosis Date  . Depression   . Hyperlipidemia   . Hypertension   . Anxiety     Past Surgical History  Procedure Laterality Date  . Cholecystectomy    . Abdominal hysterectomy    . Coronary angioplasty with stent placement  2008    Family History  Problem Relation Age of Onset  . Cancer Sister     Breast Cancer  . Hypertension Mother   . Hypertension Father     Social History   Social History  . Marital Status: Divorced    Spouse Name: N/A  . Number of Children: N/A  . Years of Education: N/A   Occupational History  . Not on file.   Social History Main Topics  . Smoking status: Former Research scientist (life sciences)  . Smokeless tobacco: Not on file  . Alcohol Use: No  . Drug Use: No  . Sexual Activity: Not on file   Other Topics Concern  . Not on file   Social History Narrative     Current outpatient prescriptions:  .  ALPRAZolam (XANAX) 0.25 MG tablet, Take 1 tablet (0.25 mg total) by mouth 2 (two) times daily as needed for anxiety., Disp: 60 tablet, Rfl: 0 .  amLODipine  (NORVASC) 10 MG tablet, Take 1 tablet (10 mg total) by mouth daily., Disp: 90 tablet, Rfl: 0 .  aspirin 81 MG EC tablet, Take 81 mg by mouth daily., Disp: , Rfl:  .  HYDROcodone-acetaminophen (NORCO/VICODIN) 5-325 MG tablet, Take 1-2 tablets by mouth at bedtime as needed for moderate pain., Disp: 60 tablet, Rfl: 0 .  losartan (COZAAR) 100 MG tablet, Take 1 tablet (100 mg total) by mouth daily., Disp: 90 tablet, Rfl: 0 .  MULTIPLE VITAMIN PO, Take by mouth daily., Disp: , Rfl:  .  potassium chloride SA (K-DUR,KLOR-CON) 20 MEQ tablet, Take 1 tablet (20 mEq total) by mouth daily., Disp: 90 tablet, Rfl: 0 .  pravastatin (PRAVACHOL) 20 MG tablet, Take 20 mg by mouth once., Disp: , Rfl:  .  tamoxifen (NOLVADEX) 10 MG tablet, Take 10 mg by mouth., Disp: , Rfl:  .  triamterene-hydrochlorothiazide (MAXZIDE-25) 37.5-25 MG tablet, Take 1 tablet by mouth daily. 37.5-25, Disp: 90 tablet, Rfl: 0  Allergies  Allergen Reactions  . Shellfish Allergy      Review of Systems  Respiratory: Negative for shortness of breath.   Cardiovascular: Negative for chest pain and palpitations.  Gastrointestinal: Negative for abdominal pain.  Neurological: Negative for headaches.  Psychiatric/Behavioral: The patient is nervous/anxious and has insomnia (trouble falling asleep).     Objective  Filed Vitals:  08/20/15 1034  BP: 129/77  Pulse: 105  Temp: 98.6 F (37 C)  TempSrc: Oral  Resp: 20  Height: 5\' 4"  (1.626 m)  Weight: 206 lb 1.6 oz (93.486 kg)  SpO2: 97%    Physical Exam  Constitutional: She is oriented to person, place, and time and well-developed, well-nourished, and in no distress.  HENT:  Head: Normocephalic and atraumatic.  Eyes: Pupils are equal, round, and reactive to light.  Cardiovascular: Normal rate and regular rhythm.   Pulmonary/Chest: Effort normal and breath sounds normal.  Neurological: She is alert and oriented to person, place, and time.  Nursing note and vitals  reviewed.      Assessment & Plan  1. Essential hypertension  - amLODipine (NORVASC) 10 MG tablet; Take 1 tablet (10 mg total) by mouth daily.  Dispense: 90 tablet; Refill: 0 - losartan (COZAAR) 100 MG tablet; Take 1 tablet (100 mg total) by mouth daily.  Dispense: 90 tablet; Refill: 0 - triamterene-hydrochlorothiazide (MAXZIDE-25) 37.5-25 MG tablet; Take 1 tablet by mouth daily. 37.5-25  Dispense: 90 tablet; Refill: 0  2. Generalized anxiety disorder  - ALPRAZolam (XANAX) 0.25 MG tablet; Take 1 tablet (0.25 mg total) by mouth 2 (two) times daily as needed for anxiety.  Dispense: 60 tablet; Refill: 0  3. Diuretic-induced hypokalemia  - potassium chloride SA (K-DUR,KLOR-CON) 20 MEQ tablet; Take 1 tablet (20 mEq total) by mouth daily.  Dispense: 90 tablet; Refill: 0  4. Hypercalcemia  - Comprehensive Metabolic Panel (CMET)  Theresa Andrews [facility] 08/20/2015 11:17 AM

## 2015-08-21 ENCOUNTER — Telehealth: Payer: Self-pay | Admitting: Family Medicine

## 2015-08-21 LAB — COMPREHENSIVE METABOLIC PANEL
A/G RATIO: 1.7 (ref 1.1–2.5)
ALBUMIN: 4.7 g/dL (ref 3.6–4.8)
ALK PHOS: 122 IU/L — AB (ref 39–117)
ALT: 13 IU/L (ref 0–32)
AST: 18 IU/L (ref 0–40)
BUN / CREAT RATIO: 16 (ref 11–26)
BUN: 12 mg/dL (ref 8–27)
Bilirubin Total: 0.3 mg/dL (ref 0.0–1.2)
CO2: 20 mmol/L (ref 18–29)
CREATININE: 0.75 mg/dL (ref 0.57–1.00)
Calcium: 10.9 mg/dL — ABNORMAL HIGH (ref 8.7–10.3)
Chloride: 100 mmol/L (ref 96–106)
GFR calc Af Amer: 95 mL/min/{1.73_m2} (ref >59)
GFR, EST NON AFRICAN AMERICAN: 82 mL/min/{1.73_m2} (ref >59)
GLOBULIN, TOTAL: 2.8 g/dL (ref 1.5–4.5)
Glucose: 132 mg/dL — ABNORMAL HIGH (ref 65–99)
POTASSIUM: 4.2 mmol/L (ref 3.5–5.2)
SODIUM: 141 mmol/L (ref 134–144)
Total Protein: 7.5 g/dL (ref 6.0–8.5)

## 2015-08-21 NOTE — Telephone Encounter (Signed)
Pt needs refill on Losartan to go to [facility]. Pt was just here on 08/20/15 and states she got all of her meds except this one.

## 2015-08-22 NOTE — Telephone Encounter (Signed)
Medication has been refilled and sent to [facility] on 08/20/2015

## 2015-08-30 ENCOUNTER — Other Ambulatory Visit: Payer: Self-pay | Admitting: *Deleted

## 2015-08-30 DIAGNOSIS — C50912 Malignant neoplasm of unspecified site of left female breast: Secondary | ICD-10-CM

## 2015-09-04 ENCOUNTER — Inpatient Hospital Stay: Payer: Medicare Other | Attending: Internal Medicine | Admitting: Internal Medicine

## 2015-09-04 ENCOUNTER — Encounter: Payer: Self-pay | Admitting: Internal Medicine

## 2015-09-04 ENCOUNTER — Inpatient Hospital Stay: Payer: Medicare Other

## 2015-09-04 VITALS — BP 136/87 | HR 93 | Temp 96.7°F | Resp 18 | Ht 64.0 in | Wt 206.6 lb

## 2015-09-04 DIAGNOSIS — Z87891 Personal history of nicotine dependence: Secondary | ICD-10-CM | POA: Diagnosis not present

## 2015-09-04 DIAGNOSIS — I10 Essential (primary) hypertension: Secondary | ICD-10-CM | POA: Insufficient documentation

## 2015-09-04 DIAGNOSIS — Z79899 Other long term (current) drug therapy: Secondary | ICD-10-CM | POA: Insufficient documentation

## 2015-09-04 DIAGNOSIS — Z170 Estrogen receptor positive status [ER+]: Secondary | ICD-10-CM | POA: Insufficient documentation

## 2015-09-04 DIAGNOSIS — Z803 Family history of malignant neoplasm of breast: Secondary | ICD-10-CM | POA: Insufficient documentation

## 2015-09-04 DIAGNOSIS — Z7982 Long term (current) use of aspirin: Secondary | ICD-10-CM | POA: Diagnosis not present

## 2015-09-04 DIAGNOSIS — C50912 Malignant neoplasm of unspecified site of left female breast: Secondary | ICD-10-CM

## 2015-09-04 DIAGNOSIS — M25551 Pain in right hip: Secondary | ICD-10-CM | POA: Insufficient documentation

## 2015-09-04 DIAGNOSIS — E785 Hyperlipidemia, unspecified: Secondary | ICD-10-CM | POA: Diagnosis not present

## 2015-09-04 DIAGNOSIS — M25552 Pain in left hip: Secondary | ICD-10-CM | POA: Insufficient documentation

## 2015-09-04 DIAGNOSIS — Z9221 Personal history of antineoplastic chemotherapy: Secondary | ICD-10-CM | POA: Diagnosis not present

## 2015-09-04 DIAGNOSIS — Z79810 Long term (current) use of selective estrogen receptor modulators (SERMs): Secondary | ICD-10-CM | POA: Diagnosis not present

## 2015-09-04 DIAGNOSIS — C50311 Malignant neoplasm of lower-inner quadrant of right female breast: Secondary | ICD-10-CM

## 2015-09-04 DIAGNOSIS — C50812 Malignant neoplasm of overlapping sites of left female breast: Secondary | ICD-10-CM | POA: Insufficient documentation

## 2015-09-04 DIAGNOSIS — R200 Anesthesia of skin: Secondary | ICD-10-CM | POA: Insufficient documentation

## 2015-09-04 LAB — CBC WITH DIFFERENTIAL/PLATELET
Basophils Absolute: 0.1 10*3/uL (ref 0–0.1)
Basophils Relative: 1 %
Eosinophils Absolute: 0.3 10*3/uL (ref 0–0.7)
Eosinophils Relative: 4 %
HEMATOCRIT: 43.7 % (ref 35.0–47.0)
Hemoglobin: 14.6 g/dL (ref 12.0–16.0)
LYMPHS PCT: 38 %
Lymphs Abs: 2.7 10*3/uL (ref 1.0–3.6)
MCH: 29.7 pg (ref 26.0–34.0)
MCHC: 33.4 g/dL (ref 32.0–36.0)
MCV: 89 fL (ref 80.0–100.0)
MONO ABS: 0.5 10*3/uL (ref 0.2–0.9)
MONOS PCT: 7 %
NEUTROS ABS: 3.5 10*3/uL (ref 1.4–6.5)
Neutrophils Relative %: 50 %
Platelets: 266 10*3/uL (ref 150–440)
RBC: 4.91 MIL/uL (ref 3.80–5.20)
RDW: 14.2 % (ref 11.5–14.5)
WBC: 7.1 10*3/uL (ref 3.6–11.0)

## 2015-09-04 LAB — COMPREHENSIVE METABOLIC PANEL
ALT: 14 U/L (ref 14–54)
AST: 14 U/L — AB (ref 15–41)
Albumin: 4.1 g/dL (ref 3.5–5.0)
Alkaline Phosphatase: 110 U/L (ref 38–126)
Anion gap: 8 (ref 5–15)
BILIRUBIN TOTAL: 0.3 mg/dL (ref 0.3–1.2)
BUN: 14 mg/dL (ref 6–20)
CO2: 28 mmol/L (ref 22–32)
CREATININE: 0.8 mg/dL (ref 0.44–1.00)
Calcium: 10.1 mg/dL (ref 8.9–10.3)
Chloride: 100 mmol/L — ABNORMAL LOW (ref 101–111)
GFR calc Af Amer: >60 mL/min (ref >60)
Glucose, Bld: 120 mg/dL — ABNORMAL HIGH (ref 65–99)
Potassium: 3.2 mmol/L — ABNORMAL LOW (ref 3.5–5.1)
Sodium: 136 mmol/L (ref 135–145)
TOTAL PROTEIN: 7.6 g/dL (ref 6.5–8.1)

## 2015-09-04 NOTE — Progress Notes (Signed)
Chaperoned provider with Breast Exam 

## 2015-09-04 NOTE — Progress Notes (Signed)
[facility] OFFICE PROGRESS NOTE  Patient Care Team: Roselee Nova, MD as PCP - General (Family Medicine)   SUMMARY OF ONCOLOGIC HISTORY:  # AUG 2011- LEFT BREAST CA Stage II ER/PR- Pos; her 2 NEG; s/p neo-adj ACx4- poor response; s/p lumpec [Dr.Arru]- ypT2 [2.5cm]ypsN-0/2; Taxol weekly 9/12 [sec to PN]; finished march 2012. Arimidex-May 2012; Aug 2013- changed to Aug 2013; sep 2013- Started Tam; Mammo- in April 2017.   # PN- G-1  INTERVAL HISTORY:  This is my first interaction with the patient since I joined the practice September 2016. I reviewed the patient's prior charts/pertinent labs/imaging in detail; findings are summarized above.   70 year old very pleasant African-American female patient with above history of stage II breast cancer ER/PR positive HER-2/neu negative currently on tamoxifen is here for follow-up.  Patient has mild pain in the hips bilaterally. Otherwise no headaches.  Patient denies any new lumps or bumps. Her appetite is good. She is not losing any weight. No vaginal discharge. No constipation no dizziness no headaches. Chronic mild tingling and numbness of the extremities.   REVIEW OF SYSTEMS:  A complete 10 point review of system is done which is negative except mentioned above/history of present illness.   PAST MEDICAL HISTORY :  Past Medical History  Diagnosis Date  . Depression   . Hyperlipidemia   . Hypertension   . Anxiety     PAST SURGICAL HISTORY :   Past Surgical History  Procedure Laterality Date  . Cholecystectomy    . Abdominal hysterectomy    . Coronary angioplasty with stent placement  2008    FAMILY HISTORY :   Family History  Problem Relation Age of Onset  . Cancer Sister     Breast Cancer  . Hypertension Mother   . Hypertension Father     SOCIAL HISTORY:   Social History  Substance Use Topics  . Smoking status: Former Research scientist (life sciences)  . Smokeless tobacco: Not on file  . Alcohol Use: No    ALLERGIES:  is  allergic to shellfish allergy.  MEDICATIONS:  Current Outpatient Prescriptions  Medication Sig Dispense Refill  . ALPRAZolam (XANAX) 0.25 MG tablet Take 1 tablet (0.25 mg total) by mouth 2 (two) times daily as needed for anxiety. 60 tablet 0  . amLODipine (NORVASC) 10 MG tablet Take 1 tablet (10 mg total) by mouth daily. 90 tablet 0  . aspirin 81 MG EC tablet Take 81 mg by mouth daily.    Marland Kitchen losartan (COZAAR) 100 MG tablet Take 1 tablet (100 mg total) by mouth daily. 90 tablet 0  . MULTIPLE VITAMIN PO Take by mouth daily.    . potassium chloride SA (K-DUR,KLOR-CON) 20 MEQ tablet Take 1 tablet (20 mEq total) by mouth daily. 90 tablet 0  . pravastatin (PRAVACHOL) 20 MG tablet Take 20 mg by mouth once.    . tamoxifen (NOLVADEX) 10 MG tablet Take 10 mg by mouth.    . triamterene-hydrochlorothiazide (MAXZIDE-25) 37.5-25 MG tablet Take 1 tablet by mouth daily. 37.5-25 90 tablet 0   No current facility-administered medications for this visit.    PHYSICAL EXAMINATION: ECOG PERFORMANCE STATUS: 0 - Asymptomatic  BP 136/87 mmHg  Pulse 93  Temp(Src) 96.7 F (35.9 C) (Tympanic)  Resp 18  Ht _0  (1.626 m)  Wt 206 lb 9.1 oz (93.7 kg)  BMI 35.44 kg/m2  Filed Weights   09/04/15 1009  Weight: 206 lb 9.1 oz (93.7 kg)    GENERAL: Well-nourished well-developed; Alert,  no distress and comfortable.   EYES: no pallor or icterus OROPHARYNX: no thrush or ulceration; good dentition  NECK: supple, no masses felt LYMPH:  no palpable lymphadenopathy in the cervical, axillary or inguinal regions LUNGS: clear to auscultation and  No wheeze or crackles HEART/CVS: regular rate & rhythm and no murmurs; No lower extremity edema ABDOMEN:abdomen soft, non-tender and normal bowel sounds Musculoskeletal:no cyanosis of digits and no clubbing  PSYCH: alert & oriented x 3 with fluent speech NEURO: no focal motor/sensory deficits SKIN:  no rashes or significant lesions Right and left BREAST exam [in the presence  of nurse]- no unusual skin changes or dominant masses felt. Surgical scars noted; left breast. No dominant masses felt.  LABORATORY DATA:  I have reviewed the data as listed    Component Value Date/Time   NA 141 08/20/2015 1143   K 4.2 08/20/2015 1143   CL 100 08/20/2015 1143   CO2 20 08/20/2015 1143   GLUCOSE 132* 08/20/2015 1143   GLUCOSE 134* 03/01/2013 1414   BUN 12 08/20/2015 1143   CREATININE 0.75 08/20/2015 1143   CREATININE 1.02 03/02/2014 1034   CALCIUM 10.9* 08/20/2015 1143   PROT 7.5 08/20/2015 1143   PROT 7.8 03/29/2015 1017   PROT 7.5 03/02/2014 1034   ALBUMIN 4.7 08/20/2015 1143   ALBUMIN 3.9 03/29/2015 1017   ALBUMIN 3.5 03/02/2014 1034   AST 18 08/20/2015 1143   AST 13* 03/02/2014 1034   ALT 13 08/20/2015 1143   ALT 21 03/02/2014 1034   ALKPHOS 122* 08/20/2015 1143   ALKPHOS 76 03/02/2014 1034   BILITOT 0.3 08/20/2015 1143   BILITOT 0.6 03/29/2015 1017   BILITOT 0.2 03/02/2014 1034   GFRNONAA 82 08/20/2015 1143   GFRNONAA 57* 03/02/2014 1034   GFRAA 95 08/20/2015 1143   GFRAA >60 03/02/2014 1034    No results found for: SPEP, UPEP  Lab Results  Component Value Date   WBC 4.5 03/29/2015   NEUTROABS 2.3 03/29/2015   HGB 14.1 03/29/2015   HCT 42.3 03/29/2015   MCV 90.2 03/29/2015   PLT 248 03/29/2015      Chemistry      Component Value Date/Time   NA 141 08/20/2015 1143   K 4.2 08/20/2015 1143   CL 100 08/20/2015 1143   CO2 20 08/20/2015 1143   BUN 12 08/20/2015 1143   CREATININE 0.75 08/20/2015 1143   CREATININE 1.02 03/02/2014 1034      Component Value Date/Time   CALCIUM 10.9* 08/20/2015 1143   ALKPHOS 122* 08/20/2015 1143   ALKPHOS 76 03/02/2014 1034   AST 18 08/20/2015 1143   AST 13* 03/02/2014 1034   ALT 13 08/20/2015 1143   ALT 21 03/02/2014 1034   BILITOT 0.3 08/20/2015 1143   BILITOT 0.6 03/29/2015 1017   BILITOT 0.2 03/02/2014 1034       RADIOGRAPHIC STUDIES: I have personally reviewed the radiological images as listed  and agreed with the findings in the report. No results found.   ASSESSMENT & PLAN:   #LEFT BREAST CA- STAGE II- ER/PR positive HER-2/neu negative. Currently on tamoxifen/antihormone therapy since 2012. Patient has mild hypercalcemia/ mild aches and pains in her hips- I would recommend further workup including CBC CMP; tumor markers. Also recommend a bone scan for further evaluation.  # Discussed regarding the use of tamoxifen for 10 years instead of 5 given the recent data from [employer] trials.   # Hypercalcemia- unclear etiology; on 2 separate occasions calcium has been around 10.9-11. Await  above workup.  # PN- G-1  # patient will have a follow-up in 2 weeks' no labs ordered that time.  # 25 minutes face-to-face with the patient discussing the above plan of care; more than 50% of time spent on prognosis/ natural history; counseling and coordination.     Cammie Sickle, MD 09/04/2015 10:46 AM

## 2015-09-05 LAB — CEA: CEA: 1.8 ng/mL (ref 0.0–4.7)

## 2015-09-05 LAB — CANCER ANTIGEN 27.29: CA 27.29: 19.1 U/mL (ref 0.0–38.6)

## 2015-09-05 LAB — MISC LABCORP TEST (SEND OUT)
LABCORP TEST CODE: 143404
LabCorp test name: 3

## 2015-09-05 LAB — PARATHYROID HORMONE, INTACT (NO CA): PTH: 81 pg/mL — AB (ref 15–65)

## 2015-09-19 ENCOUNTER — Encounter: Payer: Self-pay | Admitting: Family Medicine

## 2015-09-19 ENCOUNTER — Ambulatory Visit (INDEPENDENT_AMBULATORY_CARE_PROVIDER_SITE_OTHER): Payer: Medicare Other | Admitting: Family Medicine

## 2015-09-19 ENCOUNTER — Telehealth: Payer: Self-pay | Admitting: Family Medicine

## 2015-09-19 VITALS — BP 138/82 | HR 114 | Temp 98.5°F | Resp 18 | Ht 64.0 in | Wt 204.6 lb

## 2015-09-19 DIAGNOSIS — E785 Hyperlipidemia, unspecified: Secondary | ICD-10-CM | POA: Diagnosis not present

## 2015-09-19 DIAGNOSIS — R7303 Prediabetes: Secondary | ICD-10-CM | POA: Diagnosis not present

## 2015-09-19 DIAGNOSIS — I10 Essential (primary) hypertension: Secondary | ICD-10-CM | POA: Diagnosis not present

## 2015-09-19 NOTE — Progress Notes (Signed)
Name: Theresa Andrews   MRN: [id number]    DOB: 1945/11/06   Date:09/19/2015       Progress Note  Subjective  Chief Complaint  Chief Complaint  Patient presents with  . Hyperlipidemia    pt here for 1 month follow up and lab work  . hypercalcemia    Hyperlipidemia This is a chronic problem. The problem is controlled. Recent lipid tests were reviewed and are normal. Pertinent negatives include no chest pain, leg pain, myalgias or shortness of breath. Current antihyperlipidemic treatment includes statins.  Hypertension This is a chronic problem. The problem is controlled. Pertinent negatives include no chest pain, headaches, palpitations or shortness of breath. Past treatments include diuretics, calcium channel blockers and angiotensin blockers. Compliance problems include medication side effects (Concerned about skin burning and itching especially in the sun and heat.,thinks this is a side effect of  Triamterene-HCTZ.).   Prediabetes Pt. Is here to repeat A1c. Last A1c in December 2015 was 6.4%. Pt. Has no FHx of Diabetes.   Past Medical History  Diagnosis Date  . Depression   . Hyperlipidemia   . Hypertension   . Anxiety   . Breast cancer ([place])     pT2 pN0 cM0 (stage IIA) invasive mammary carcinoma of left outer breast status post lumpectomy and sentinel node study on July 14, 2010  . History of chemotherapy   . CAD (coronary artery disease)   . History of MI (myocardial infarction)   . Hyperglycemia   . H/O hypokalemia   . Peripheral neuropathy due to chemotherapy [facility])     Past Surgical History  Procedure Laterality Date  . Cholecystectomy    . Abdominal hysterectomy    . Coronary angioplasty with stent placement  2008    Family History  Problem Relation Age of Onset  . Breast cancer Sister   . Hypertension Mother   . Hypertension Father   . Heart attack Sister   . Stomach cancer Sister     Social History   Social History  . Marital Status: Divorced   Spouse Name: N/A  . Number of Children: N/A  . Years of Education: N/A   Occupational History  . Not on file.   Social History Main Topics  . Smoking status: Former Research scientist (life sciences)  . Smokeless tobacco: Not on file  . Alcohol Use: No  . Drug Use: No  . Sexual Activity: Not on file   Other Topics Concern  . Not on file   Social History Narrative     Current outpatient prescriptions:  .  ALPRAZolam (XANAX) 0.25 MG tablet, Take 1 tablet (0.25 mg total) by mouth 2 (two) times daily as needed for anxiety., Disp: 60 tablet, Rfl: 0 .  amLODipine (NORVASC) 10 MG tablet, Take 1 tablet (10 mg total) by mouth daily., Disp: 90 tablet, Rfl: 0 .  aspirin 81 MG EC tablet, Take 81 mg by mouth daily., Disp: , Rfl:  .  losartan (COZAAR) 100 MG tablet, Take 1 tablet (100 mg total) by mouth daily., Disp: 90 tablet, Rfl: 0 .  MULTIPLE VITAMIN PO, Take by mouth daily., Disp: , Rfl:  .  potassium chloride SA (K-DUR,KLOR-CON) 20 MEQ tablet, Take 1 tablet (20 mEq total) by mouth daily., Disp: 90 tablet, Rfl: 0 .  pravastatin (PRAVACHOL) 20 MG tablet, Take 20 mg by mouth once., Disp: , Rfl:  .  tamoxifen (NOLVADEX) 10 MG tablet, Take 10 mg by mouth., Disp: , Rfl:  .  triamterene-hydrochlorothiazide (MAXZIDE-25) 37.5-25 MG  tablet, Take 1 tablet by mouth daily. 37.5-25, Disp: 90 tablet, Rfl: 0  Allergies  Allergen Reactions  . Shellfish Allergy      Review of Systems  Constitutional: Negative for fever and chills.  Respiratory: Negative for shortness of breath.   Cardiovascular: Negative for chest pain, palpitations and leg swelling.  Musculoskeletal: Negative for myalgias.  Skin: Positive for itching.  Neurological: Negative for headaches.     Objective  Filed Vitals:   09/19/15 0852  BP: 138/82  Pulse: 114  Temp: 98.5 F (36.9 C)  Resp: 18  Height: 5\' 4"  (1.626 m)  Weight: 204 lb 9 oz (92.789 kg)  SpO2: 98%    Physical Exam  Constitutional: She is oriented to person, place, and time and  well-developed, well-nourished, and in no distress.  HENT:  Head: Normocephalic and atraumatic.  Cardiovascular: Normal rate and regular rhythm.   Pulmonary/Chest: Effort normal and breath sounds normal.  Abdominal: Soft. Bowel sounds are normal.  Neurological: She is alert and oriented to person, place, and time.  Skin: Skin is warm and dry.  Psychiatric: Mood, memory, affect and judgment normal.  Nursing note and vitals reviewed.    Assessment & Plan  1. Prediabetes  - POCT HgB A1C  2. Essential hypertension Discussed starting patient on metoprolol and recommended that she check with her cardiologist. Continue on amlodipine him on losartan, and triamterene-HCTZ for now  3. HLD (hyperlipidemia)  - Lipid Profile  4. Hypercalcemia Patient being followed by oncology, recently had a PTH which was elevated.   Kamryn Messineo [name] 09/19/2015 9:05 AM

## 2015-09-19 NOTE — Telephone Encounter (Signed)
Pt states she forgot to ask for a refill on her diabetic strips to be sent to [facility].

## 2015-09-20 ENCOUNTER — Inpatient Hospital Stay: Payer: Medicare Other

## 2015-09-20 ENCOUNTER — Inpatient Hospital Stay: Payer: Medicare Other | Admitting: Internal Medicine

## 2015-09-20 LAB — LIPID PANEL
CHOL/HDL RATIO: 3.5 ratio (ref 0.0–4.4)
Cholesterol, Total: 158 mg/dL (ref 100–199)
HDL: 45 mg/dL (ref >39)
LDL CALC: 94 mg/dL (ref 0–99)
TRIGLYCERIDES: 94 mg/dL (ref 0–149)
VLDL Cholesterol Cal: 19 mg/dL (ref 5–40)

## 2015-09-25 ENCOUNTER — Encounter
Admission: RE | Admit: 2015-09-25 | Discharge: 2015-09-25 | Disposition: A | Discharge status: Home / Self Care | Payer: Medicare Other | Source: Ambulatory Visit | Attending: Internal Medicine | Admitting: Internal Medicine

## 2015-09-25 DIAGNOSIS — C50311 Malignant neoplasm of lower-inner quadrant of right female breast: Secondary | ICD-10-CM | POA: Diagnosis not present

## 2015-09-25 MED ORDER — TECHNETIUM TC 99M MEDRONATE IV KIT
21.3860 | PACK | Freq: Once | INTRAVENOUS | Status: AC | PRN
  Administered 2015-09-25: 21.386 via INTRAVENOUS

## 2015-09-27 ENCOUNTER — Inpatient Hospital Stay: Payer: Medicare Other | Attending: Internal Medicine | Admitting: Internal Medicine

## 2015-09-27 ENCOUNTER — Inpatient Hospital Stay: Payer: Medicare Other

## 2015-09-27 ENCOUNTER — Encounter: Payer: Self-pay | Admitting: Internal Medicine

## 2015-09-27 VITALS — BP 120/64 | HR 85 | Temp 96.2°F | Resp 18 | Ht 64.0 in | Wt 204.1 lb

## 2015-09-27 DIAGNOSIS — R200 Anesthesia of skin: Secondary | ICD-10-CM | POA: Insufficient documentation

## 2015-09-27 DIAGNOSIS — L568 Other specified acute skin changes due to ultraviolet radiation: Secondary | ICD-10-CM | POA: Diagnosis not present

## 2015-09-27 DIAGNOSIS — Z9221 Personal history of antineoplastic chemotherapy: Secondary | ICD-10-CM | POA: Diagnosis not present

## 2015-09-27 DIAGNOSIS — I252 Old myocardial infarction: Secondary | ICD-10-CM | POA: Diagnosis not present

## 2015-09-27 DIAGNOSIS — C50912 Malignant neoplasm of unspecified site of left female breast: Secondary | ICD-10-CM

## 2015-09-27 DIAGNOSIS — Z79810 Long term (current) use of selective estrogen receptor modulators (SERMs): Secondary | ICD-10-CM | POA: Insufficient documentation

## 2015-09-27 DIAGNOSIS — I2510 Atherosclerotic heart disease of native coronary artery without angina pectoris: Secondary | ICD-10-CM | POA: Diagnosis not present

## 2015-09-27 DIAGNOSIS — C50812 Malignant neoplasm of overlapping sites of left female breast: Secondary | ICD-10-CM | POA: Insufficient documentation

## 2015-09-27 DIAGNOSIS — E785 Hyperlipidemia, unspecified: Secondary | ICD-10-CM | POA: Diagnosis not present

## 2015-09-27 DIAGNOSIS — M160 Bilateral primary osteoarthritis of hip: Secondary | ICD-10-CM | POA: Insufficient documentation

## 2015-09-27 DIAGNOSIS — F418 Other specified anxiety disorders: Secondary | ICD-10-CM | POA: Insufficient documentation

## 2015-09-27 DIAGNOSIS — I10 Essential (primary) hypertension: Secondary | ICD-10-CM | POA: Diagnosis not present

## 2015-09-27 DIAGNOSIS — Z170 Estrogen receptor positive status [ER+]: Secondary | ICD-10-CM | POA: Diagnosis not present

## 2015-09-27 DIAGNOSIS — Z79899 Other long term (current) drug therapy: Secondary | ICD-10-CM | POA: Diagnosis not present

## 2015-09-27 DIAGNOSIS — Z87891 Personal history of nicotine dependence: Secondary | ICD-10-CM | POA: Diagnosis not present

## 2015-09-27 DIAGNOSIS — R202 Paresthesia of skin: Secondary | ICD-10-CM | POA: Insufficient documentation

## 2015-09-27 DIAGNOSIS — Z7982 Long term (current) use of aspirin: Secondary | ICD-10-CM | POA: Diagnosis not present

## 2015-09-27 NOTE — Progress Notes (Signed)
[facility] OFFICE PROGRESS NOTE  Patient Care Team: Theresa Nova, MD as PCP - General (Family Medicine)   SUMMARY OF ONCOLOGIC HISTORY:  # AUG 2011- LEFT BREAST CA Stage II ER/PR- Pos; her 2 NEG; s/p neo-adj ACx4- poor response; s/p lumpec [Dr.Arru]- ypT2 [2.5cm]ypsN-0/2; Taxol weekly 9/12 [sec to PN]; finished march 2012. Arimidex-May 2012; Aug 2013- changed to Aug 2013; sep 2013- Started Tam; Mammo- in April 2017.   # Feb 2017- Bone scan- Negative   # PN- G-1  INTERVAL HISTORY:  70 year old very pleasant African-American female patient with above history of stage II breast cancer ER/PR positive HER-2/neu negative currently on tamoxifen is here for follow-up/review the results of the bone scan that was ordered for hypercalcemia.  Patient denies any worsening bone pain; she is chronic arthritic symptoms in her hips. Not any worse.  She states that she has been noting increasing photosensitivity- within the last many months. She first noticed it about 3 years ago. She states her skin gets red/start to after exposure to sun. Denies abdominal pain or any blisters on the skin.   Otherwise no headaches. Patient denies any new lumps or bumps. Her appetite is good. She is not losing any weight. No vaginal discharge. No constipation no dizziness no headaches. Chronic mild tingling and numbness of the extremities.   REVIEW OF SYSTEMS:  A complete 10 point review of system is done which is negative except mentioned above/history of present illness.   PAST MEDICAL HISTORY :  Past Medical History  Diagnosis Date  . Depression   . Hyperlipidemia   . Hypertension   . Anxiety   . Breast cancer ([place])     pT2 pN0 cM0 (stage IIA) invasive mammary carcinoma of left outer breast status post lumpectomy and sentinel node study on July 14, 2010  . History of chemotherapy   . CAD (coronary artery disease)   . History of MI (myocardial infarction)   . Hyperglycemia   . H/O  hypokalemia   . Peripheral neuropathy due to chemotherapy [facility])     PAST SURGICAL HISTORY :   Past Surgical History  Procedure Laterality Date  . Cholecystectomy    . Abdominal hysterectomy    . Coronary angioplasty with stent placement  2008    FAMILY HISTORY :   Family History  Problem Relation Age of Onset  . Breast cancer Sister   . Hypertension Mother   . Hypertension Father   . Heart attack Sister   . Stomach cancer Sister     SOCIAL HISTORY:   Social History  Substance Use Topics  . Smoking status: Former Research scientist (life sciences)  . Smokeless tobacco: None  . Alcohol Use: No    ALLERGIES:  is allergic to shellfish allergy.  MEDICATIONS:  Current Outpatient Prescriptions  Medication Sig Dispense Refill  . ALPRAZolam (XANAX) 0.25 MG tablet Take 1 tablet (0.25 mg total) by mouth 2 (two) times daily as needed for anxiety. 60 tablet 0  . amLODipine (NORVASC) 10 MG tablet Take 1 tablet (10 mg total) by mouth daily. 90 tablet 0  . aspirin 81 MG EC tablet Take 81 mg by mouth daily.    Marland Kitchen losartan (COZAAR) 100 MG tablet Take 1 tablet (100 mg total) by mouth daily. 90 tablet 0  . potassium chloride SA (K-DUR,KLOR-CON) 20 MEQ tablet Take 1 tablet (20 mEq total) by mouth daily. 90 tablet 0  . pravastatin (PRAVACHOL) 20 MG tablet Take 20 mg by mouth once.    Marland Kitchen  tamoxifen (NOLVADEX) 10 MG tablet Take 10 mg by mouth.    . triamterene-hydrochlorothiazide (MAXZIDE-25) 37.5-25 MG tablet Take 1 tablet by mouth daily. 37.5-25 90 tablet 0   No current facility-administered medications for this visit.    PHYSICAL EXAMINATION: ECOG PERFORMANCE STATUS: 0 - Asymptomatic  BP 120/64 mmHg  Pulse 85  Temp(Src) 96.2 F (35.7 C) (Tympanic)  Resp 18  Ht 5' 4"  (1.626 m)  Wt 204 lb 2.3 oz (92.6 kg)  BMI 35.02 kg/m2  Filed Weights   09/27/15 1010  Weight: 204 lb 2.3 oz (92.6 kg)    GENERAL: Well-nourished well-developed; Alert, no distress and comfortable.   EYES: no pallor or icterus OROPHARYNX:  no thrush or ulceration; good dentition  NECK: supple, no masses felt LYMPH:  no palpable lymphadenopathy in the cervical, axillary or inguinal regions LUNGS: clear to auscultation and  No wheeze or crackles HEART/CVS: regular rate & rhythm and no murmurs; No lower extremity edema ABDOMEN:abdomen soft, non-tender and normal bowel sounds Musculoskeletal:no cyanosis of digits and no clubbing  PSYCH: alert & oriented x 3 with fluent speech NEURO: no focal motor/sensory deficits SKIN:  no rashes or significant lesions   LABORATORY DATA:  I have reviewed the data as listed    Component Value Date/Time   NA 136 09/04/2015 1148   NA 141 08/20/2015 1143   K 3.2* 09/04/2015 1148   CL 100* 09/04/2015 1148   CO2 28 09/04/2015 1148   GLUCOSE 120* 09/04/2015 1148   GLUCOSE 132* 08/20/2015 1143   GLUCOSE 134* 03/01/2013 1414   BUN 14 09/04/2015 1148   BUN 12 08/20/2015 1143   CREATININE 0.80 09/04/2015 1148   CREATININE 1.02 03/02/2014 1034   CALCIUM 10.1 09/04/2015 1148   PROT 7.6 09/04/2015 1148   PROT 7.5 08/20/2015 1143   PROT 7.5 03/02/2014 1034   ALBUMIN 4.1 09/04/2015 1148   ALBUMIN 4.7 08/20/2015 1143   ALBUMIN 3.5 03/02/2014 1034   AST 14* 09/04/2015 1148   AST 13* 03/02/2014 1034   ALT 14 09/04/2015 1148   ALT 21 03/02/2014 1034   ALKPHOS 110 09/04/2015 1148   ALKPHOS 76 03/02/2014 1034   BILITOT 0.3 09/04/2015 1148   BILITOT 0.3 08/20/2015 1143   BILITOT 0.2 03/02/2014 1034   GFRNONAA >60 09/04/2015 1148   GFRNONAA 57* 03/02/2014 1034   GFRAA >60 09/04/2015 1148   GFRAA >60 03/02/2014 1034    No results found for: SPEP, UPEP  Lab Results  Component Value Date   WBC 7.1 09/04/2015   NEUTROABS 3.5 09/04/2015   HGB 14.6 09/04/2015   HCT 43.7 09/04/2015   MCV 89.0 09/04/2015   PLT 266 09/04/2015      Chemistry      Component Value Date/Time   NA 136 09/04/2015 1148   NA 141 08/20/2015 1143   K 3.2* 09/04/2015 1148   CL 100* 09/04/2015 1148   CO2 28  09/04/2015 1148   BUN 14 09/04/2015 1148   BUN 12 08/20/2015 1143   CREATININE 0.80 09/04/2015 1148   CREATININE 1.02 03/02/2014 1034      Component Value Date/Time   CALCIUM 10.1 09/04/2015 1148   ALKPHOS 110 09/04/2015 1148   ALKPHOS 76 03/02/2014 1034   AST 14* 09/04/2015 1148   AST 13* 03/02/2014 1034   ALT 14 09/04/2015 1148   ALT 21 03/02/2014 1034   BILITOT 0.3 09/04/2015 1148   BILITOT 0.3 08/20/2015 1143   BILITOT 0.2 03/02/2014 1034  RADIOGRAPHIC STUDIES: I have personally reviewed the radiological images as listed and agreed with the findings in the report. Nm Bone Scan Whole Body  09/25/2015  CLINICAL DATA:  Breast cancer and hip pain with exercise. Hypercalcemia. EXAM: NUCLEAR MEDICINE WHOLE BODY BONE SCAN TECHNIQUE: Whole body anterior and posterior images were obtained approximately 3 hours after intravenous injection of radiopharmaceutical. RADIOPHARMACEUTICALS:  21.386 mCi Technetium-49mMDP IV COMPARISON:  None. FINDINGS: Renal and bladder activity is present. Prominent soft tissue activity but symmetric and generalized. No metastatic pattern of bone activity. No hip activity to suggest focal osteoarthritis or fracture. IMPRESSION: No indication of bony metastasis. No asymmetric activity about the hips. Electronically Signed   By: JMonte FantasiaM.D.   On: 09/25/2015 13:27     ASSESSMENT & PLAN:   #LEFT BREAST CA- STAGE II- ER/PR positive HER-2/neu negative. Currently on tamoxifen/antihormone therapy since 2012. Clinically no evidence of recurrence noted. Bone scan- negative for any suspicious metastatic lesions. I recommend mammogram as planned in April 2017.  # hypercalcemia unclear etiology- again bone scan reviewed/images by me; no evidence of any metastatic lesions. Hypercalcemia itself resolved.   # photosensitivity of the skin- unclear etiology. No major concerns for porphyrias at this time. Question medication induced follow up with PCP/  dermatology.  # PN- G-1  # otherwise patient will follow-up with me in approximately 6 months with labs.  # 25 minutes face-to-face with the patient discussing the above plan of care; more than 50% of time spent on prognosis/ natural history; counseling and coordination.     GCammie Sickle MD 09/27/2015 10:20 AM

## 2015-09-27 NOTE — Progress Notes (Signed)
Here to get results of scan and test drawn at last visit.  Pt does mention that when she is out in the sun- she itches and breaks out in rash at times and then pt scratches a lot.  She also is trying to loose wt.  She also say the neuropathy is so much better and she has cream that she puts on her feet by massaging it in

## 2015-10-02 ENCOUNTER — Other Ambulatory Visit: Payer: Self-pay | Admitting: Family Medicine

## 2015-10-02 DIAGNOSIS — F411 Generalized anxiety disorder: Secondary | ICD-10-CM

## 2015-10-02 MED ORDER — ALPRAZOLAM 0.25 MG PO TABS: 0.2500 mg | ORAL_TABLET | Freq: Two times a day (BID) | ORAL | Status: DC | PRN

## 2015-10-02 NOTE — Telephone Encounter (Signed)
Pt needs refill on Alprazolam.

## 2015-10-02 NOTE — Telephone Encounter (Signed)
Routed to Dr. [name] for refill approval

## 2015-10-17 ENCOUNTER — Ambulatory Visit: Payer: Medicare Other | Admitting: Family Medicine

## 2015-10-21 ENCOUNTER — Telehealth: Payer: Self-pay | Admitting: Family Medicine

## 2015-10-21 NOTE — Telephone Encounter (Signed)
Patient has had cold for 2 weeks. She does not have any congestion in her chest however patient is hoarse with nasal congestion. Have tried gargling with warm salt water and using nasal saline and coriciden. She did have green mucus and it has began to clear up. Would like to know if you could prescribe her something to take. Never ran a fever, did have slight cough, not really a sore throat. Please send to [facility]

## 2015-10-22 NOTE — Telephone Encounter (Signed)
Routed to Dr. [name] for new prescription approval

## 2015-10-23 NOTE — Telephone Encounter (Signed)
Called patient and she feels much better compared to when she contacted our office. Symptoms have improved. No indication for any antibiotics. She was reassured.

## 2015-10-31 ENCOUNTER — Ambulatory Visit
Admission: RE | Admit: 2015-10-31 | Discharge: 2015-10-31 | Disposition: A | Discharge status: Home / Self Care | Payer: Medicare Other | Source: Ambulatory Visit | Attending: Internal Medicine | Admitting: Internal Medicine

## 2015-10-31 ENCOUNTER — Other Ambulatory Visit: Payer: Self-pay | Admitting: Internal Medicine

## 2015-10-31 DIAGNOSIS — C50912 Malignant neoplasm of unspecified site of left female breast: Secondary | ICD-10-CM

## 2015-10-31 DIAGNOSIS — Z853 Personal history of malignant neoplasm of breast: Secondary | ICD-10-CM | POA: Insufficient documentation

## 2015-11-01 ENCOUNTER — Telehealth: Payer: Self-pay | Admitting: Family Medicine

## 2015-11-01 NOTE — Telephone Encounter (Signed)
Requesting refill on her cholesterol medication Pravastatin, she has only one pill left. Please send to [facility]

## 2015-11-06 MED ORDER — PRAVASTATIN SODIUM 20 MG PO TABS: 20.0000 mg | ORAL_TABLET | Freq: Once | ORAL | Status: DC

## 2015-11-06 NOTE — Telephone Encounter (Signed)
Medication has been refilled and sent to [facility] [address] 

## 2015-11-22 ENCOUNTER — Encounter: Payer: Self-pay | Admitting: *Deleted

## 2015-11-22 ENCOUNTER — Ambulatory Visit: Payer: Medicare Other | Admitting: Anesthesiology

## 2015-11-22 ENCOUNTER — Encounter
Admission: RE | Disposition: A | Discharge status: Home / Self Care | Payer: Self-pay | Source: Ambulatory Visit | Attending: Gastroenterology

## 2015-11-22 ENCOUNTER — Ambulatory Visit
Admission: RE | Admit: 2015-11-22 | Discharge: 2015-11-22 | Disposition: A | Discharge status: Home / Self Care | Payer: Medicare Other | Source: Ambulatory Visit | Attending: Gastroenterology | Admitting: Gastroenterology

## 2015-11-22 DIAGNOSIS — I10 Essential (primary) hypertension: Secondary | ICD-10-CM | POA: Insufficient documentation

## 2015-11-22 DIAGNOSIS — Z853 Personal history of malignant neoplasm of breast: Secondary | ICD-10-CM | POA: Insufficient documentation

## 2015-11-22 DIAGNOSIS — F329 Major depressive disorder, single episode, unspecified: Secondary | ICD-10-CM | POA: Diagnosis not present

## 2015-11-22 DIAGNOSIS — Z7982 Long term (current) use of aspirin: Secondary | ICD-10-CM | POA: Diagnosis not present

## 2015-11-22 DIAGNOSIS — E785 Hyperlipidemia, unspecified: Secondary | ICD-10-CM | POA: Diagnosis not present

## 2015-11-22 DIAGNOSIS — G709 Myoneural disorder, unspecified: Secondary | ICD-10-CM | POA: Insufficient documentation

## 2015-11-22 DIAGNOSIS — I252 Old myocardial infarction: Secondary | ICD-10-CM | POA: Insufficient documentation

## 2015-11-22 DIAGNOSIS — E669 Obesity, unspecified: Secondary | ICD-10-CM | POA: Diagnosis not present

## 2015-11-22 DIAGNOSIS — Z955 Presence of coronary angioplasty implant and graft: Secondary | ICD-10-CM | POA: Insufficient documentation

## 2015-11-22 DIAGNOSIS — D124 Benign neoplasm of descending colon: Secondary | ICD-10-CM | POA: Insufficient documentation

## 2015-11-22 DIAGNOSIS — F419 Anxiety disorder, unspecified: Secondary | ICD-10-CM | POA: Diagnosis not present

## 2015-11-22 DIAGNOSIS — Z6833 Body mass index (BMI) 33.0-33.9, adult: Secondary | ICD-10-CM | POA: Diagnosis not present

## 2015-11-22 DIAGNOSIS — Z79899 Other long term (current) drug therapy: Secondary | ICD-10-CM | POA: Insufficient documentation

## 2015-11-22 DIAGNOSIS — Z1211 Encounter for screening for malignant neoplasm of colon: Secondary | ICD-10-CM | POA: Insufficient documentation

## 2015-11-22 DIAGNOSIS — Z87891 Personal history of nicotine dependence: Secondary | ICD-10-CM | POA: Insufficient documentation

## 2015-11-22 DIAGNOSIS — I2510 Atherosclerotic heart disease of native coronary artery without angina pectoris: Secondary | ICD-10-CM | POA: Diagnosis not present

## 2015-11-22 DIAGNOSIS — Z9221 Personal history of antineoplastic chemotherapy: Secondary | ICD-10-CM | POA: Diagnosis not present

## 2015-11-22 HISTORY — PX: COLONOSCOPY WITH PROPOFOL: SHX5780

## 2015-11-22 SURGERY — COLONOSCOPY WITH PROPOFOL
Anesthesia: General

## 2015-11-22 MED ORDER — SODIUM CHLORIDE 0.9 % IV SOLN: INTRAVENOUS | Status: DC

## 2015-11-22 MED ORDER — PROPOFOL 10 MG/ML IV BOLUS
INTRAVENOUS | Status: DC | PRN
  Administered 2015-11-22: 20 mg via INTRAVENOUS
  Administered 2015-11-22 (×2): 40 mg via INTRAVENOUS

## 2015-11-22 MED ORDER — PROPOFOL 500 MG/50ML IV EMUL
INTRAVENOUS | Status: DC | PRN
Start: 2015-11-22 — End: 2015-11-22
  Administered 2015-11-22: 150 ug/kg/min via INTRAVENOUS

## 2015-11-22 MED ORDER — SODIUM CHLORIDE 0.9 % IV SOLN
INTRAVENOUS | Status: DC
  Administered 2015-11-22: 09:00:00 via INTRAVENOUS

## 2015-11-22 MED ORDER — LIDOCAINE HCL (CARDIAC) 20 MG/ML IV SOLN
INTRAVENOUS | Status: DC | PRN
  Administered 2015-11-22: 60 mg via INTRAVENOUS

## 2015-11-22 NOTE — Transfer of Care (Signed)
Immediate Anesthesia Transfer of Care Note  Patient: Theresa Andrews  Procedure(s) Performed: Procedure(s): COLONOSCOPY WITH PROPOFOL (N/A)  Patient Location: Endoscopy Unit  Anesthesia Type:General  Level of Consciousness: sedated  Airway & Oxygen Therapy: Patient Spontanous Breathing and Patient connected to nasal cannula oxygen  Post-op Assessment: Post -op Vital signs reviewed and stable  Post vital signs: stable  Last Vitals:  Filed Vitals:   11/22/15 0859 11/22/15 1005  BP: 157/90 96/57  Pulse: 86 70  Temp: 35.9 C 36.3 C  Resp: 18 15    Last Pain: There were no vitals filed for this visit.       Complications: No apparent anesthesia complications

## 2015-11-22 NOTE — Anesthesia Preprocedure Evaluation (Addendum)
Anesthesia Evaluation  Patient identified by MRN, date of birth, ID band Patient awake    Reviewed: Allergy & Precautions, NPO status , Patient's Chart, lab work & pertinent test results, reviewed documented beta blocker date and time   Airway Mallampati: III  TM Distance: >3 FB     Dental  (+) Chipped, Partial Upper   Pulmonary former smoker,           Cardiovascular hypertension, Pt. on medications + CAD       Neuro/Psych PSYCHIATRIC DISORDERS Anxiety Depression  Neuromuscular disease    GI/Hepatic   Endo/Other    Renal/GU      Musculoskeletal   Abdominal   Peds  Hematology   Anesthesia Other Findings Obese. Cardiac stent. K 3.2. Takes KCl.  Reproductive/Obstetrics                           Anesthesia Physical Anesthesia Plan  ASA: III  Anesthesia Plan: General   Post-op Pain Management:    Induction: Intravenous  Airway Management Planned: Nasal Cannula  Additional Equipment:   Intra-op Plan:   Post-operative Plan:   Informed Consent: I have reviewed the patients History and Physical, chart, labs and discussed the procedure including the risks, benefits and alternatives for the proposed anesthesia with the patient or authorized representative who has indicated his/her understanding and acceptance.     Plan Discussed with: CRNA  Anesthesia Plan Comments:         Anesthesia Quick Evaluation

## 2015-11-22 NOTE — Op Note (Signed)
[facility] Gastroenterology Patient Name: Theresa Andrews Procedure Date: 11/22/2015 9:39 AM MRN: [id number] Account #: [id number] Date of Birth: [birth year] Admit Type: Outpatient Age: 70 Room: [facility] ENDO ROOM 4 Gender: Female Note Status: Finalized Procedure:            Colonoscopy Indications:          Personal history of colonic polyps Providers:            Lupita Dawn. Candace Cruise, MD Medicines:            Monitored Anesthesia Care Complications:        No immediate complications. Procedure:            Pre-Anesthesia Assessment:                       - Prior to the procedure, a History and Physical was                        performed, and patient medications, allergies and                        sensitivities were reviewed. The patient's tolerance of                        previous anesthesia was reviewed.                       - The risks and benefits of the procedure and the                        sedation options and risks were discussed with the                        patient. All questions were answered and informed                        consent was obtained.                       - After reviewing the risks and benefits, the patient                        was deemed in satisfactory condition to undergo the                        procedure.                       After obtaining informed consent, the colonoscope was                        passed under direct vision. Throughout the procedure,                        the patient's blood pressure, pulse, and oxygen                        saturations were monitored continuously. The Olympus                        PCF-H180AL colonoscope ( S#: [id number] ) was introduced  through the anus and advanced to the the cecum,                        identified by appendiceal orifice and ileocecal valve.                        The colonoscopy was performed without difficulty. The                        patient  tolerated the procedure well. The quality of                        the bowel preparation was good. Findings:      A small polyp was found in the descending colon. The polyp was sessile.       The polyp was removed with a jumbo cold forceps. Resection and retrieval       were complete.      The exam was otherwise without abnormality.      Multiple small and large-mouthed diverticula were found in the sigmoid       colon. Impression:           - One small polyp in the descending colon, removed with                        a jumbo cold forceps. Resected and retrieved.                       - The examination was otherwise normal. Recommendation:       - Discharge patient to home.                       - Await pathology results.                       - Repeat colonoscopy in 5 years for surveillance based                        on pathology results.                       - The findings and recommendations were discussed with                        the patient. Procedure Code(s):    --- Professional ---                       [phone], Colonoscopy, flexible; with biopsy, single or                        multiple Diagnosis Code(s):    --- Professional ---                       D12.4, Benign neoplasm of descending colon                       Z86.010, Personal history of colonic polyps CPT copyright 2016 American Medical Association. All rights reserved. The codes documented in this report are preliminary and upon coder review may  be revised to meet current compliance requirements. Hulen Luster, MD 11/22/2015 10:04:03 AM This report  has been signed electronically. Number of Addenda: 0 Note Initiated On: 11/22/2015 9:39 AM Scope Withdrawal Time: 0 hours 4 minutes 37 seconds  Total Procedure Duration: 0 hours 11 minutes 50 seconds       [facility]

## 2015-11-22 NOTE — H&P (Signed)
Primary Care Physician:  Keith Rake, MD Primary Gastroenterologist:  Dr. Candace Cruise  Pre-Procedure History & Physical: HPI:  Theresa Andrews is a 70 y.o. female is here for an colonoscopy.   Past Medical History  Diagnosis Date  . Depression   . Hyperlipidemia   . Hypertension   . Anxiety   . Breast cancer ([name])     pT2 pN0 cM0 (stage IIA) invasive mammary carcinoma of left outer breast status post lumpectomy and sentinel node study on July 14, 2010  . History of chemotherapy   . CAD (coronary artery disease)   . History of MI (myocardial infarction)   . Hyperglycemia   . H/O hypokalemia   . Peripheral neuropathy due to chemotherapy [facility])     Past Surgical History  Procedure Laterality Date  . Cholecystectomy    . Abdominal hysterectomy    . Coronary angioplasty with stent placement  2008  . Breast biopsy Left 2008    neg  . Breast excisional biopsy Left 2011    +    Prior to Admission medications   Medication Sig Start Date End Date Taking? Authorizing Provider  amLODipine (NORVASC) 10 MG tablet Take 1 tablet (10 mg total) by mouth daily. 08/20/15  Yes Roselee Nova, MD  aspirin 81 MG EC tablet Take 81 mg by mouth daily.   Yes Roselee Nova, MD  losartan (COZAAR) 100 MG tablet Take 1 tablet (100 mg total) by mouth daily. 08/20/15  Yes Roselee Nova, MD  triamterene-hydrochlorothiazide (MAXZIDE-25) 37.5-25 MG tablet Take 1 tablet by mouth daily. 37.5-25 08/20/15  Yes Syed Richmond Campbell, MD  ALPRAZolam Duanne Moron) 0.25 MG tablet Take 1 tablet (0.25 mg total) by mouth 2 (two) times daily as needed for anxiety. 10/02/15   Roselee Nova, MD  potassium chloride SA (K-DUR,KLOR-CON) 20 MEQ tablet Take 1 tablet (20 mEq total) by mouth daily. 08/20/15   Roselee Nova, MD  pravastatin (PRAVACHOL) 20 MG tablet Take 1 tablet (20 mg total) by mouth once. 11/06/15   Roselee Nova, MD  tamoxifen (NOLVADEX) 10 MG tablet Take 10 mg by mouth. Reported on 11/22/2015    Historical  Provider, MD    Allergies as of 11/08/2015 - Review Complete 09/27/2015  Allergen Reaction Noted  . Shellfish allergy  10/29/2014    Family History  Problem Relation Age of Onset  . Breast cancer Sister   . Hypertension Mother   . Hypertension Father   . Heart attack Sister   . Stomach cancer Sister     Social History   Social History  . Marital Status: Divorced    Spouse Name: N/A  . Number of Children: N/A  . Years of Education: N/A   Occupational History  . Not on file.   Social History Main Topics  . Smoking status: Former Research scientist (life sciences)  . Smokeless tobacco: Never Used  . Alcohol Use: No  . Drug Use: No  . Sexual Activity: Not on file   Other Topics Concern  . Not on file   Social History Narrative    Review of Systems: See HPI, otherwise negative ROS  Physical Exam: BP 157/90 mmHg  Pulse 86  Temp(Src) 96.6 F (35.9 C) (Tympanic)  Resp 18  Ht 5\' 4"  (1.626 m)  Wt 198 lb (89.812 kg)  BMI 33.97 kg/m2  SpO2 100% General:   Alert,  pleasant and cooperative in NAD Head:  Normocephalic and atraumatic. Neck:  Supple; no masses or thyromegaly. Lungs:  Clear throughout to auscultation.    Heart:  Regular rate and rhythm. Abdomen:  Soft, nontender and nondistended. Normal bowel sounds, without guarding, and without rebound.   Neurologic:  Alert and  oriented x4;  grossly normal neurologically.  Impression/Plan: Theresa Andrews is here for an colonoscopy to be performed for personal hx of colon polyps  Risks, benefits, limitations, and alternatives regarding  colonoscopy have been reviewed with the patient.  Questions have been answered.  All parties agreeable.   Angeles Paolucci, Lupita Dawn, MD  11/22/2015, 9:40 AM

## 2015-11-24 NOTE — Anesthesia Postprocedure Evaluation (Signed)
Anesthesia Post Note  Patient: Theresa Andrews  Procedure(s) Performed: Procedure(s) (LRB): COLONOSCOPY WITH PROPOFOL (N/A)  Patient location during evaluation: Endoscopy Anesthesia Type: General Level of consciousness: awake and alert Pain management: pain level controlled Vital Signs Assessment: post-procedure vital signs reviewed and stable Respiratory status: spontaneous breathing, nonlabored ventilation, respiratory function stable and patient connected to nasal cannula oxygen Cardiovascular status: blood pressure returned to baseline and stable Postop Assessment: no signs of nausea or vomiting Anesthetic complications: no    Last Vitals:  Filed Vitals:   11/22/15 1025 11/22/15 1035  BP: 122/71 126/82  Pulse: 69 63  Temp:    Resp: 17 15    Last Pain: There were no vitals filed for this visit.               Kindal Ponti S

## 2015-11-25 LAB — SURGICAL PATHOLOGY

## 2015-12-02 ENCOUNTER — Telehealth: Payer: Self-pay | Admitting: Family Medicine

## 2015-12-02 DIAGNOSIS — I10 Essential (primary) hypertension: Secondary | ICD-10-CM

## 2015-12-02 MED ORDER — TRIAMTERENE-HCTZ 37.5-25 MG PO TABS: 1.0000 | ORAL_TABLET | Freq: Every day | ORAL | Status: DC

## 2015-12-02 MED ORDER — LOSARTAN POTASSIUM 100 MG PO TABS: 100.0000 mg | ORAL_TABLET | Freq: Every day | ORAL | Status: DC

## 2015-12-02 MED ORDER — AMLODIPINE BESYLATE 10 MG PO TABS: 10.0000 mg | ORAL_TABLET | Freq: Every day | ORAL | Status: DC

## 2015-12-02 NOTE — Telephone Encounter (Signed)
Prescription for mentioned drugs have been sent to pharmacy

## 2015-12-02 NOTE — Telephone Encounter (Signed)
Pt needs refill on Amlodipine 10 mg, Losartan, Potassium and Triant/HCTZ to be called into [facility].

## 2015-12-04 ENCOUNTER — Telehealth: Payer: Self-pay | Admitting: Family Medicine

## 2015-12-04 DIAGNOSIS — E876 Hypokalemia: Secondary | ICD-10-CM

## 2015-12-04 DIAGNOSIS — T502X5A Adverse effect of carbonic-anhydrase inhibitors, benzothiadiazides and other diuretics, initial encounter: Principal | ICD-10-CM

## 2015-12-04 MED ORDER — POTASSIUM CHLORIDE CRYS ER 20 MEQ PO TBCR: 20.0000 meq | EXTENDED_RELEASE_TABLET | Freq: Every day | ORAL | Status: DC

## 2015-12-04 NOTE — Telephone Encounter (Signed)
Patient thanks you for the refills, however the pharmacy did not receive the prescription for her Potassium. Please send to [facility]

## 2015-12-04 NOTE — Telephone Encounter (Signed)
Medication has been sent I apologize for overlooking it.

## 2015-12-04 NOTE — Telephone Encounter (Signed)
Patient informed by voicemail

## 2016-03-13 ENCOUNTER — Telehealth: Payer: Self-pay | Admitting: Family Medicine

## 2016-03-13 DIAGNOSIS — I10 Essential (primary) hypertension: Secondary | ICD-10-CM

## 2016-03-13 MED ORDER — LOSARTAN POTASSIUM 100 MG PO TABS: 100.0000 mg | ORAL_TABLET | Freq: Every day | ORAL | 0 refills | Status: DC

## 2016-03-13 NOTE — Telephone Encounter (Signed)
Patient verbally informed prescription is at pharmacy.

## 2016-03-13 NOTE — Telephone Encounter (Signed)
Prescription for losartan has been sent to patient's pharmacy

## 2016-03-16 ENCOUNTER — Ambulatory Visit (INDEPENDENT_AMBULATORY_CARE_PROVIDER_SITE_OTHER): Payer: Medicare Other | Admitting: Family Medicine

## 2016-03-16 ENCOUNTER — Encounter: Payer: Self-pay | Admitting: Family Medicine

## 2016-03-16 VITALS — BP 126/72 | HR 100 | Temp 98.4°F | Resp 16 | Ht 64.0 in | Wt 204.4 lb

## 2016-03-16 DIAGNOSIS — F411 Generalized anxiety disorder: Secondary | ICD-10-CM

## 2016-03-16 DIAGNOSIS — E785 Hyperlipidemia, unspecified: Secondary | ICD-10-CM | POA: Diagnosis not present

## 2016-03-16 DIAGNOSIS — T502X5A Adverse effect of carbonic-anhydrase inhibitors, benzothiadiazides and other diuretics, initial encounter: Secondary | ICD-10-CM

## 2016-03-16 DIAGNOSIS — I10 Essential (primary) hypertension: Secondary | ICD-10-CM | POA: Diagnosis not present

## 2016-03-16 DIAGNOSIS — R739 Hyperglycemia, unspecified: Secondary | ICD-10-CM | POA: Diagnosis not present

## 2016-03-16 DIAGNOSIS — E876 Hypokalemia: Secondary | ICD-10-CM | POA: Diagnosis not present

## 2016-03-16 DIAGNOSIS — R7303 Prediabetes: Secondary | ICD-10-CM | POA: Diagnosis not present

## 2016-03-16 LAB — GLUCOSE, POCT (MANUAL RESULT ENTRY): POC GLUCOSE: 146 mg/dL — AB (ref 70–99)

## 2016-03-16 LAB — POCT GLYCOSYLATED HEMOGLOBIN (HGB A1C): HEMOGLOBIN A1C: 6.4

## 2016-03-16 MED ORDER — POTASSIUM CHLORIDE CRYS ER 20 MEQ PO TBCR: 20.0000 meq | EXTENDED_RELEASE_TABLET | Freq: Every day | ORAL | 0 refills | Status: DC

## 2016-03-16 MED ORDER — TRIAMTERENE-HCTZ 37.5-25 MG PO TABS: 1.0000 | ORAL_TABLET | Freq: Every day | ORAL | 0 refills | Status: DC

## 2016-03-16 MED ORDER — ALPRAZOLAM 0.25 MG PO TABS: 0.2500 mg | ORAL_TABLET | Freq: Two times a day (BID) | ORAL | 2 refills | Status: DC | PRN

## 2016-03-16 MED ORDER — AMLODIPINE BESYLATE 10 MG PO TABS: 10.0000 mg | ORAL_TABLET | Freq: Every day | ORAL | 0 refills | Status: DC

## 2016-03-16 MED ORDER — PRAVASTATIN SODIUM 20 MG PO TABS
20.0000 mg | ORAL_TABLET | ORAL | 0 refills | Status: DC
Start: 2016-03-16 — End: 2016-12-10

## 2016-03-16 NOTE — Progress Notes (Signed)
Name: Theresa Andrews   MRN: [id number]    DOB: 05/22/1946   Date:03/16/2016       Progress Note  Subjective  Chief Complaint  Chief Complaint  Patient presents with  . Hyperlipidemia    medication refills  . Hypertension    Hyperlipidemia  This is a chronic problem. The problem is controlled. Recent lipid tests were reviewed and are normal. Associated symptoms include myalgias (feels her bones ache with Pravastatin.). Pertinent negatives include no chest pain or shortness of breath. Current antihyperlipidemic treatment includes statins. There are no compliance problems.  Risk factors for coronary artery disease include diabetes mellitus and dyslipidemia.  Hypertension  This is a chronic problem. The problem is unchanged. The problem is controlled. Associated symptoms include anxiety. Pertinent negatives include no blurred vision, chest pain, headaches, palpitations or shortness of breath. Past treatments include calcium channel blockers, diuretics and angiotensin blockers. There are no compliance problems.  There is no history of kidney disease or CAD/MI.  Anxiety  Presents for follow-up visit. Symptoms include nervous/anxious behavior. Patient reports no chest pain, excessive worry, palpitations, panic, restlessness or shortness of breath. Symptoms occur most days. The severity of symptoms is moderate.      Past Medical History:  Diagnosis Date  . Anxiety   . Breast cancer ([place])    pT2 pN0 cM0 (stage IIA) invasive mammary carcinoma of left outer breast status post lumpectomy and sentinel node study on July 14, 2010  . CAD (coronary artery disease)   . Depression   . H/O hypokalemia   . History of chemotherapy   . History of MI (myocardial infarction)   . Hyperglycemia   . Hyperlipidemia   . Hypertension   . Peripheral neuropathy due to chemotherapy [facility])     Past Surgical History:  Procedure Laterality Date  . ABDOMINAL HYSTERECTOMY    . BREAST BIOPSY Left 2008   neg   . BREAST EXCISIONAL BIOPSY Left 2011   +  . CHOLECYSTECTOMY    . COLONOSCOPY WITH PROPOFOL N/A 11/22/2015   Procedure: COLONOSCOPY WITH PROPOFOL;  Surgeon: Hulen Luster, MD;  Location: [facility] ENDOSCOPY;  Service: Gastroenterology;  Laterality: N/A;  . CORONARY ANGIOPLASTY WITH STENT PLACEMENT  2008    Family History  Problem Relation Age of Onset  . Breast cancer Sister   . Hypertension Mother   . Hypertension Father   . Heart attack Sister   . Stomach cancer Sister     Social History   Social History  . Marital status: Divorced    Spouse name: N/A  . Number of children: N/A  . Years of education: N/A   Occupational History  . Not on file.   Social History Main Topics  . Smoking status: Former Research scientist (life sciences)  . Smokeless tobacco: Never Used  . Alcohol use No  . Drug use: No  . Sexual activity: Not on file   Other Topics Concern  . Not on file   Social History Narrative  . No narrative on file     Current Outpatient Prescriptions:  .  ALPRAZolam (XANAX) 0.25 MG tablet, Take 1 tablet (0.25 mg total) by mouth 2 (two) times daily as needed for anxiety., Disp: 60 tablet, Rfl: 2 .  amLODipine (NORVASC) 10 MG tablet, Take 1 tablet (10 mg total) by mouth daily., Disp: 90 tablet, Rfl: 0 .  aspirin 81 MG EC tablet, Take 81 mg by mouth daily., Disp: , Rfl:  .  losartan (COZAAR) 100 MG tablet, Take 1  tablet (100 mg total) by mouth daily., Disp: 90 tablet, Rfl: 0 .  potassium chloride SA (K-DUR,KLOR-CON) 20 MEQ tablet, Take 1 tablet (20 mEq total) by mouth daily., Disp: 90 tablet, Rfl: 0 .  pravastatin (PRAVACHOL) 20 MG tablet, Take 1 tablet (20 mg total) by mouth once., Disp: 90 tablet, Rfl: 0 .  triamterene-hydrochlorothiazide (MAXZIDE-25) 37.5-25 MG tablet, Take 1 tablet by mouth daily. 37.5-25, Disp: 90 tablet, Rfl: 0 .  tamoxifen (NOLVADEX) 10 MG tablet, Take 10 mg by mouth. Reported on 11/22/2015, Disp: , Rfl:   Allergies  Allergen Reactions  . Shellfish Allergy      Review of  Systems  Eyes: Negative for blurred vision.  Respiratory: Negative for shortness of breath.   Cardiovascular: Negative for chest pain and palpitations.  Gastrointestinal: Negative for abdominal pain.  Musculoskeletal: Positive for myalgias (feels her bones ache with Pravastatin.).  Neurological: Negative for headaches.  Psychiatric/Behavioral: The patient is nervous/anxious.     Objective  Vitals:   03/16/16 1031  BP: 126/72  Pulse: 100  Resp: 16  Temp: 98.4 F (36.9 C)  TempSrc: Oral  SpO2: 98%  Weight: 204 lb 6.4 oz (92.7 kg)  Height: 5\' 4"  (1.626 m)    Physical Exam  Constitutional: She is oriented to person, place, and time and well-developed, well-nourished, and in no distress.  HENT:  Head: Normocephalic and atraumatic.  Cardiovascular: Normal rate and regular rhythm.   Pulmonary/Chest: Effort normal and breath sounds normal. She has no wheezes.  Abdominal: Soft. Bowel sounds are normal.  Neurological: She is alert and oriented to person, place, and time.  Skin: Skin is warm and dry.  Psychiatric: Mood, memory, affect and judgment normal.  Nursing note and vitals reviewed.   Assessment & Plan  1. Diuretic-induced hypokalemia  - potassium chloride SA (K-DUR,KLOR-CON) 20 MEQ tablet; Take 1 tablet (20 mEq total) by mouth daily.  Dispense: 90 tablet; Refill: 0  2. Essential hypertension BP stable on present therapy - amLODipine (NORVASC) 10 MG tablet; Take 1 tablet (10 mg total) by mouth daily.  Dispense: 90 tablet; Refill: 0 - triamterene-hydrochlorothiazide (MAXZIDE-25) 37.5-25 MG tablet; Take 1 tablet by mouth daily. 37.5-25  Dispense: 90 tablet; Refill: 0  3. Generalized anxiety disorder  - ALPRAZolam (XANAX) 0.25 MG tablet; Take 1 tablet (0.25 mg total) by mouth 2 (two) times daily as needed for anxiety.  Dispense: 60 tablet; Refill: 2  4. HLD (hyperlipidemia) Repeat FLP in 3 months, advised to take pravastatin every other day because of myalgias. -  pravastatin (PRAVACHOL) 20 MG tablet; Take 1 tablet (20 mg total) by mouth every other day.  Dispense: 90 tablet; Refill: 0 - COMPLETE METABOLIC PANEL WITH GFR  5. Prediabetes A1c is 6.4%, considered borderline diabetes. - POCT HgB A1C  6. Hyperglycemia  - POCT HgB A1C - POCT Glucose (CBG)   Dejha King [name] 03/16/2016 10:49 AM

## 2016-03-24 ENCOUNTER — Other Ambulatory Visit: Payer: Self-pay | Admitting: *Deleted

## 2016-03-24 DIAGNOSIS — Z853 Personal history of malignant neoplasm of breast: Secondary | ICD-10-CM

## 2016-03-27 ENCOUNTER — Inpatient Hospital Stay: Payer: Medicare Other | Attending: Internal Medicine

## 2016-03-27 ENCOUNTER — Inpatient Hospital Stay (HOSPITAL_BASED_OUTPATIENT_CLINIC_OR_DEPARTMENT_OTHER): Payer: Medicare Other | Admitting: Internal Medicine

## 2016-03-27 DIAGNOSIS — Z9221 Personal history of antineoplastic chemotherapy: Secondary | ICD-10-CM | POA: Insufficient documentation

## 2016-03-27 DIAGNOSIS — Z9223 Personal history of estrogen therapy: Secondary | ICD-10-CM | POA: Diagnosis not present

## 2016-03-27 DIAGNOSIS — I2510 Atherosclerotic heart disease of native coronary artery without angina pectoris: Secondary | ICD-10-CM

## 2016-03-27 DIAGNOSIS — I10 Essential (primary) hypertension: Secondary | ICD-10-CM | POA: Diagnosis not present

## 2016-03-27 DIAGNOSIS — Z803 Family history of malignant neoplasm of breast: Secondary | ICD-10-CM | POA: Insufficient documentation

## 2016-03-27 DIAGNOSIS — G620 Drug-induced polyneuropathy: Secondary | ICD-10-CM

## 2016-03-27 DIAGNOSIS — I252 Old myocardial infarction: Secondary | ICD-10-CM | POA: Insufficient documentation

## 2016-03-27 DIAGNOSIS — Z170 Estrogen receptor positive status [ER+]: Secondary | ICD-10-CM | POA: Insufficient documentation

## 2016-03-27 DIAGNOSIS — E785 Hyperlipidemia, unspecified: Secondary | ICD-10-CM

## 2016-03-27 DIAGNOSIS — C50812 Malignant neoplasm of overlapping sites of left female breast: Secondary | ICD-10-CM

## 2016-03-27 DIAGNOSIS — Z87891 Personal history of nicotine dependence: Secondary | ICD-10-CM | POA: Insufficient documentation

## 2016-03-27 DIAGNOSIS — Z853 Personal history of malignant neoplasm of breast: Secondary | ICD-10-CM | POA: Insufficient documentation

## 2016-03-27 DIAGNOSIS — Z7982 Long term (current) use of aspirin: Secondary | ICD-10-CM | POA: Insufficient documentation

## 2016-03-27 DIAGNOSIS — Z800 Family history of malignant neoplasm of digestive organs: Secondary | ICD-10-CM

## 2016-03-27 DIAGNOSIS — Z79899 Other long term (current) drug therapy: Secondary | ICD-10-CM | POA: Diagnosis not present

## 2016-03-27 DIAGNOSIS — T451X5S Adverse effect of antineoplastic and immunosuppressive drugs, sequela: Secondary | ICD-10-CM | POA: Diagnosis not present

## 2016-03-27 LAB — CBC WITH DIFFERENTIAL/PLATELET
BASOS PCT: 2 %
Basophils Absolute: 0.1 10*3/uL (ref 0–0.1)
Eosinophils Absolute: 0.4 10*3/uL (ref 0–0.7)
Eosinophils Relative: 7 %
HEMATOCRIT: 42.7 % (ref 35.0–47.0)
HEMOGLOBIN: 14.5 g/dL (ref 12.0–16.0)
Lymphocytes Relative: 35 %
Lymphs Abs: 2.3 10*3/uL (ref 1.0–3.6)
MCH: 30.8 pg (ref 26.0–34.0)
MCHC: 33.9 g/dL (ref 32.0–36.0)
MCV: 90.7 fL (ref 80.0–100.0)
MONOS PCT: 7 %
Monocytes Absolute: 0.5 10*3/uL (ref 0.2–0.9)
NEUTROS ABS: 3.3 10*3/uL (ref 1.4–6.5)
NEUTROS PCT: 49 %
Platelets: 281 10*3/uL (ref 150–440)
RBC: 4.7 MIL/uL (ref 3.80–5.20)
RDW: 13.5 % (ref 11.5–14.5)
WBC: 6.6 10*3/uL (ref 3.6–11.0)

## 2016-03-27 LAB — COMPREHENSIVE METABOLIC PANEL
ALBUMIN: 4.2 g/dL (ref 3.5–5.0)
ALK PHOS: 92 U/L (ref 38–126)
ALT: 15 U/L (ref 14–54)
ANION GAP: 8 (ref 5–15)
AST: 22 U/L (ref 15–41)
BILIRUBIN TOTAL: 0.5 mg/dL (ref 0.3–1.2)
BUN: 14 mg/dL (ref 6–20)
CALCIUM: 10.1 mg/dL (ref 8.9–10.3)
CO2: 25 mmol/L (ref 22–32)
CREATININE: 0.83 mg/dL (ref 0.44–1.00)
Chloride: 104 mmol/L (ref 101–111)
GFR calc Af Amer: >60 mL/min (ref >60)
GFR calc non Af Amer: >60 mL/min (ref >60)
GLUCOSE: 141 mg/dL — AB (ref 65–99)
Potassium: 3.4 mmol/L — ABNORMAL LOW (ref 3.5–5.1)
Sodium: 137 mmol/L (ref 135–145)
TOTAL PROTEIN: 7.8 g/dL (ref 6.5–8.1)

## 2016-03-27 NOTE — Assessment & Plan Note (Addendum)
#  LEFT BREAST CA- STAGE II- ER/PR positive HER-2/neu negative. Currently on tamoxifen/antihormone therapy since 2012; stopped in May 2017.   Clinically no evidence of recurrence noted.  # declines extended anti-hormone therapy.   # PN- G-1  Follow up in may 2018/mammo/labs.

## 2016-03-27 NOTE — Progress Notes (Signed)
[place] OFFICE PROGRESS NOTE  Patient Care Team: Roselee Nova, MD as PCP - General (Family Medicine)   SUMMARY OF ONCOLOGIC HISTORY: Oncology History   # AUG 2011- LEFT BREAST CA Stage II ER/PR- Pos; her 2 NEG; s/p neo-adj ACx4- poor response; s/p lumpec [Dr.Arru]- ypT2 [2.5cm]ypsN-0/2; Taxol weekly 9/12 [sec to PN]; finished march 2012. Arimidex-May 2012; Aug 2013- changed to Aug 2013; sep 2013- Started Tam; Mammo- in April 2017.   # Declines extended anti-hormone therapy.   # Feb 2017- Bone scan- Negative ; mammogram April 2017.   # PN- G-1     Breast CA ([place])   10/29/2014 Initial Diagnosis    Breast CA ([place])      Cancer of overlapping sites of left female breast ([place])   03/27/2016 Initial Diagnosis    Cancer of overlapping sites of left female breast [facility])       INTERVAL HISTORY:  70 year old very pleasant African-American female patient with above history of stage II breast cancer ER/PR positive HER-2/neu negative-is here for follow-up. Patient stopped taking tamoxifen in May 2017.    Otherwise no headaches. Patient denies any new lumps or bumps. Her appetite is good. She is not losing any weight. No vaginal discharge. No constipation no dizziness no headaches. Chronic mild tingling and numbness of the extremities.   REVIEW OF SYSTEMS:  A complete 10 point review of system is done which is negative except mentioned above/history of present illness.   PAST MEDICAL HISTORY :  Past Medical History:  Diagnosis Date  . Anxiety   . Breast cancer ([place])    pT2 pN0 cM0 (stage IIA) invasive mammary carcinoma of left outer breast status post lumpectomy and sentinel node study on July 14, 2010  . CAD (coronary artery disease)   . Depression   . H/O hypokalemia   . History of chemotherapy   . History of MI (myocardial infarction)   . Hyperglycemia   . Hyperlipidemia   . Hypertension   . Peripheral neuropathy due to chemotherapy [facility])     PAST  SURGICAL HISTORY :   Past Surgical History:  Procedure Laterality Date  . ABDOMINAL HYSTERECTOMY    . BREAST BIOPSY Left 2008   neg  . BREAST EXCISIONAL BIOPSY Left 2011   +  . CHOLECYSTECTOMY    . COLONOSCOPY WITH PROPOFOL N/A 11/22/2015   Procedure: COLONOSCOPY WITH PROPOFOL;  Surgeon: Hulen Luster, MD;  Location: [facility] ENDOSCOPY;  Service: Gastroenterology;  Laterality: N/A;  . CORONARY ANGIOPLASTY WITH STENT PLACEMENT  2008    FAMILY HISTORY :   Family History  Problem Relation Age of Onset  . Breast cancer Sister   . Hypertension Mother   . Hypertension Father   . Heart attack Sister   . Stomach cancer Sister     SOCIAL HISTORY:   Social History  Substance Use Topics  . Smoking status: Former Research scientist (life sciences)  . Smokeless tobacco: Never Used  . Alcohol use No    ALLERGIES:  is allergic to shellfish allergy.  MEDICATIONS:  Current Outpatient Prescriptions  Medication Sig Dispense Refill  . ALPRAZolam (XANAX) 0.25 MG tablet Take 1 tablet (0.25 mg total) by mouth 2 (two) times daily as needed for anxiety. 60 tablet 2  . amLODipine (NORVASC) 10 MG tablet Take 1 tablet (10 mg total) by mouth daily. 90 tablet 0  . aspirin 81 MG EC tablet Take 81 mg by mouth daily.    Marland Kitchen losartan (COZAAR) 100 MG tablet  Take 1 tablet (100 mg total) by mouth daily. 90 tablet 0  . potassium chloride SA (K-DUR,KLOR-CON) 20 MEQ tablet Take 1 tablet (20 mEq total) by mouth daily. 90 tablet 0  . pravastatin (PRAVACHOL) 20 MG tablet Take 1 tablet (20 mg total) by mouth every other day. 90 tablet 0  . triamterene-hydrochlorothiazide (MAXZIDE-25) 37.5-25 MG tablet Take 1 tablet by mouth daily. 37.5-25 90 tablet 0   No current facility-administered medications for this visit.     PHYSICAL EXAMINATION: ECOG PERFORMANCE STATUS: 0 - Asymptomatic  BP 131/89 (BP Location: Left Arm, Patient Position: Sitting)   Pulse 82   Temp (!) 95.2 F (35.1 C) (Tympanic)   Resp 18   Wt 204 lb 2 oz (92.6 kg)   BMI 35.04  kg/m   Filed Weights   03/27/16 1048  Weight: 204 lb 2 oz (92.6 kg)    GENERAL: Well-nourished well-developed; Alert, no distress and comfortable.   EYES: no pallor or icterus OROPHARYNX: no thrush or ulceration; good dentition  NECK: supple, no masses felt LYMPH:  no palpable lymphadenopathy in the cervical, axillary or inguinal regions LUNGS: clear to auscultation and  No wheeze or crackles HEART/CVS: regular rate & rhythm and no murmurs; No lower extremity edema ABDOMEN:abdomen soft, non-tender and normal bowel sounds Musculoskeletal:no cyanosis of digits and no clubbing  PSYCH: alert & oriented x 3 with fluent speech NEURO: no focal motor/sensory deficits SKIN:  no rashes or significant lesions  Right and left BREAST exam [in the presence of nurse]- no unusual skin changes or dominant masses felt. Surgical scars noted.     LABORATORY DATA:  I have reviewed the data as listed    Component Value Date/Time   NA 137 03/27/2016 0958   NA 141 08/20/2015 1143   K 3.4 (L) 03/27/2016 0958   CL 104 03/27/2016 0958   CO2 25 03/27/2016 0958   GLUCOSE 141 (H) 03/27/2016 0958   GLUCOSE 134 (H) 03/01/2013 1414   BUN 14 03/27/2016 0958   BUN 12 08/20/2015 1143   CREATININE 0.83 03/27/2016 0958   CREATININE 1.02 03/02/2014 1034   CALCIUM 10.1 03/27/2016 0958   PROT 7.8 03/27/2016 0958   PROT 7.5 08/20/2015 1143   PROT 7.5 03/02/2014 1034   ALBUMIN 4.2 03/27/2016 0958   ALBUMIN 4.7 08/20/2015 1143   ALBUMIN 3.5 03/02/2014 1034   AST 22 03/27/2016 0958   AST 13 (L) 03/02/2014 1034   ALT 15 03/27/2016 0958   ALT 21 03/02/2014 1034   ALKPHOS 92 03/27/2016 0958   ALKPHOS 76 03/02/2014 1034   BILITOT 0.5 03/27/2016 0958   BILITOT 0.3 08/20/2015 1143   BILITOT 0.2 03/02/2014 1034   GFRNONAA >60 03/27/2016 0958   GFRNONAA 57 (L) 03/02/2014 1034   GFRAA >60 03/27/2016 0958   GFRAA >60 03/02/2014 1034    No results found for: SPEP, UPEP  Lab Results  Component Value Date    WBC 6.6 03/27/2016   NEUTROABS 3.3 03/27/2016   HGB 14.5 03/27/2016   HCT 42.7 03/27/2016   MCV 90.7 03/27/2016   PLT 281 03/27/2016      Chemistry      Component Value Date/Time   NA 137 03/27/2016 0958   NA 141 08/20/2015 1143   K 3.4 (L) 03/27/2016 0958   CL 104 03/27/2016 0958   CO2 25 03/27/2016 0958   BUN 14 03/27/2016 0958   BUN 12 08/20/2015 1143   CREATININE 0.83 03/27/2016 0958   CREATININE 1.02 03/02/2014 1034  Component Value Date/Time   CALCIUM 10.1 03/27/2016 0958   ALKPHOS 92 03/27/2016 0958   ALKPHOS 76 03/02/2014 1034   AST 22 03/27/2016 0958   AST 13 (L) 03/02/2014 1034   ALT 15 03/27/2016 0958   ALT 21 03/02/2014 1034   BILITOT 0.5 03/27/2016 0958   BILITOT 0.3 08/20/2015 1143   BILITOT 0.2 03/02/2014 1034       RADIOGRAPHIC STUDIES: I have personally reviewed the radiological images as listed and agreed with the findings in the report. No results found.   ASSESSMENT & PLAN:   Cancer of overlapping sites of left female breast ([place]) #LEFT BREAST CA- STAGE II- ER/PR positive HER-2/neu negative. Currently on tamoxifen/antihormone therapy since 2012; stopped in May 2017.   Clinically no evidence of recurrence noted.  # declines extended anti-hormone therapy.   # PN- G-1  Follow up in may 2018/mammo/labs.       Cammie Sickle, MD 03/27/2016 7:05 PM

## 2016-03-29 LAB — CA 27.29 (SERIAL MONITOR): CA 27.29: 17.9 U/mL (ref 0.0–38.6)

## 2016-06-03 ENCOUNTER — Telehealth: Payer: Self-pay | Admitting: *Deleted

## 2016-06-03 NOTE — Telephone Encounter (Signed)
Was exercising doing squats and she now has pain, states seh had a bone scan done in March that was nml. Asking if she needs to be concerned. I advised she probably pulled a muscle and to call back in 2 weeks if not improved.

## 2016-06-22 ENCOUNTER — Ambulatory Visit: Payer: Medicare Other | Admitting: Family Medicine

## 2016-06-22 ENCOUNTER — Telehealth: Payer: Self-pay | Admitting: Family Medicine

## 2016-06-22 DIAGNOSIS — I10 Essential (primary) hypertension: Secondary | ICD-10-CM

## 2016-06-22 NOTE — Telephone Encounter (Signed)
Pt needs refill on Losartan and Amlodipine to be sent to [facility]. Pt has an appt for next week.

## 2016-06-24 MED ORDER — AMLODIPINE BESYLATE 10 MG PO TABS: 10.0000 mg | ORAL_TABLET | Freq: Every day | ORAL | 0 refills | Status: DC

## 2016-06-24 MED ORDER — LOSARTAN POTASSIUM 100 MG PO TABS: 100.0000 mg | ORAL_TABLET | Freq: Every day | ORAL | 0 refills | Status: DC

## 2016-06-24 NOTE — Telephone Encounter (Signed)
Medication has been refilled and sent to [facility] [address] 

## 2016-06-29 ENCOUNTER — Ambulatory Visit (INDEPENDENT_AMBULATORY_CARE_PROVIDER_SITE_OTHER): Payer: Medicare Other | Admitting: Family Medicine

## 2016-06-29 VITALS — BP 136/80 | HR 102 | Temp 98.1°F | Resp 16 | Ht 64.0 in | Wt 200.6 lb

## 2016-06-29 DIAGNOSIS — T502X5A Adverse effect of carbonic-anhydrase inhibitors, benzothiadiazides and other diuretics, initial encounter: Secondary | ICD-10-CM | POA: Diagnosis not present

## 2016-06-29 DIAGNOSIS — E785 Hyperlipidemia, unspecified: Secondary | ICD-10-CM | POA: Diagnosis not present

## 2016-06-29 DIAGNOSIS — F411 Generalized anxiety disorder: Secondary | ICD-10-CM

## 2016-06-29 DIAGNOSIS — E876 Hypokalemia: Secondary | ICD-10-CM

## 2016-06-29 DIAGNOSIS — I10 Essential (primary) hypertension: Secondary | ICD-10-CM | POA: Diagnosis not present

## 2016-06-29 LAB — COMPLETE METABOLIC PANEL WITH GFR
ALBUMIN: 4.2 g/dL (ref 3.6–5.1)
ALK PHOS: 93 U/L (ref 33–130)
ALT: 11 U/L (ref 6–29)
AST: 14 U/L (ref 10–35)
BILIRUBIN TOTAL: 0.3 mg/dL (ref 0.2–1.2)
BUN: 15 mg/dL (ref 7–25)
CALCIUM: 10.4 mg/dL (ref 8.6–10.4)
CO2: 27 mmol/L (ref 20–31)
Chloride: 100 mmol/L (ref 98–110)
Creat: 0.82 mg/dL (ref 0.50–0.99)
GFR, EST AFRICAN AMERICAN: 84 mL/min (ref >=60)
GFR, EST NON AFRICAN AMERICAN: 73 mL/min (ref >=60)
Glucose, Bld: 125 mg/dL — ABNORMAL HIGH (ref 65–99)
POTASSIUM: 3.8 mmol/L (ref 3.5–5.3)
SODIUM: 137 mmol/L (ref 135–146)
Total Protein: 7.2 g/dL (ref 6.1–8.1)

## 2016-06-29 LAB — LIPID PANEL
CHOL/HDL RATIO: 4 ratio (ref <5.0)
CHOLESTEROL: 171 mg/dL (ref <200)
HDL: 43 mg/dL — AB (ref >50)
LDL Cholesterol: 109 mg/dL — ABNORMAL HIGH (ref <100)
TRIGLYCERIDES: 94 mg/dL (ref <150)
VLDL: 19 mg/dL (ref <30)

## 2016-06-29 MED ORDER — POTASSIUM CHLORIDE CRYS ER 20 MEQ PO TBCR: 20.0000 meq | EXTENDED_RELEASE_TABLET | Freq: Every day | ORAL | 0 refills | Status: DC

## 2016-06-29 MED ORDER — TRIAMTERENE-HCTZ 37.5-25 MG PO TABS: 1.0000 | ORAL_TABLET | Freq: Every day | ORAL | 0 refills | Status: DC

## 2016-06-29 MED ORDER — ALPRAZOLAM 0.25 MG PO TABS: 0.2500 mg | ORAL_TABLET | Freq: Two times a day (BID) | ORAL | 2 refills | Status: DC | PRN

## 2016-06-29 NOTE — Progress Notes (Signed)
Name: Theresa Andrews   MRN: [id number]    DOB: 23-Sep-1945   Date:06/29/2016       Progress Note  Subjective  Chief Complaint  Chief Complaint  Patient presents with  . Follow-up     medication refills, lab work    Hyperlipidemia  This is a chronic problem. The problem is controlled. Recent lipid tests were reviewed and are normal. Associated symptoms include myalgias. Pertinent negatives include no chest pain or shortness of breath. Current antihyperlipidemic treatment includes statins. Compliance problems include medication side effects (now taking 1/2 tablet every day).  Risk factors for coronary artery disease include diabetes mellitus and dyslipidemia.  Hypertension  This is a chronic problem. The problem is unchanged. The problem is controlled. Associated symptoms include anxiety. Pertinent negatives include no blurred vision, chest pain, headaches, palpitations or shortness of breath. Past treatments include calcium channel blockers, diuretics and angiotensin blockers. There are no compliance problems.  There is no history of kidney disease or CAD/MI.  Anxiety  Presents for follow-up visit. Symptoms include nervous/anxious behavior. Patient reports no chest pain, excessive worry, palpitations, panic, restlessness or shortness of breath. Symptoms occur most days. The severity of symptoms is moderate.      Past Medical History:  Diagnosis Date  . Anxiety   . Breast cancer ([place])    pT2 pN0 cM0 (stage IIA) invasive mammary carcinoma of left outer breast status post lumpectomy and sentinel node study on July 14, 2010  . CAD (coronary artery disease)   . Depression   . H/O hypokalemia   . History of chemotherapy   . History of MI (myocardial infarction)   . Hyperglycemia   . Hyperlipidemia   . Hypertension   . Peripheral neuropathy due to chemotherapy [facility])     Past Surgical History:  Procedure Laterality Date  . ABDOMINAL HYSTERECTOMY    . BREAST BIOPSY Left 2008   neg   . BREAST EXCISIONAL BIOPSY Left 2011   +  . CHOLECYSTECTOMY    . COLONOSCOPY WITH PROPOFOL N/A 11/22/2015   Procedure: COLONOSCOPY WITH PROPOFOL;  Surgeon: Hulen Luster, MD;  Location: [facility] ENDOSCOPY;  Service: Gastroenterology;  Laterality: N/A;  . CORONARY ANGIOPLASTY WITH STENT PLACEMENT  2008    Family History  Problem Relation Age of Onset  . Breast cancer Sister   . Hypertension Mother   . Hypertension Father   . Heart attack Sister   . Stomach cancer Sister     Social History   Social History  . Marital status: Divorced    Spouse name: N/A  . Number of children: N/A  . Years of education: N/A   Occupational History  . Not on file.   Social History Main Topics  . Smoking status: Former Research scientist (life sciences)  . Smokeless tobacco: Never Used  . Alcohol use No  . Drug use: No  . Sexual activity: Not on file   Other Topics Concern  . Not on file   Social History Narrative  . No narrative on file     Current Outpatient Prescriptions:  .  ALPRAZolam (XANAX) 0.25 MG tablet, Take 1 tablet (0.25 mg total) by mouth 2 (two) times daily as needed for anxiety., Disp: 60 tablet, Rfl: 2 .  amLODipine (NORVASC) 10 MG tablet, Take 1 tablet (10 mg total) by mouth daily., Disp: 90 tablet, Rfl: 0 .  aspirin 81 MG EC tablet, Take 81 mg by mouth daily., Disp: , Rfl:  .  losartan (COZAAR) 100 MG tablet, Take  1 tablet (100 mg total) by mouth daily., Disp: 90 tablet, Rfl: 0 .  potassium chloride SA (K-DUR,KLOR-CON) 20 MEQ tablet, Take 1 tablet (20 mEq total) by mouth daily., Disp: 90 tablet, Rfl: 0 .  pravastatin (PRAVACHOL) 20 MG tablet, Take 1 tablet (20 mg total) by mouth every other day., Disp: 90 tablet, Rfl: 0 .  triamterene-hydrochlorothiazide (MAXZIDE-25) 37.5-25 MG tablet, Take 1 tablet by mouth daily. 37.5-25, Disp: 90 tablet, Rfl: 0  Allergies  Allergen Reactions  . Shellfish Allergy      Review of Systems  Eyes: Negative for blurred vision.  Respiratory: Negative for shortness of  breath.   Cardiovascular: Negative for chest pain and palpitations.  Musculoskeletal: Positive for myalgias.  Neurological: Negative for headaches.  Psychiatric/Behavioral: The patient is nervous/anxious.     Objective  Vitals:   06/29/16 0942  BP: 136/80  Pulse: (!) 102  Resp: 16  Temp: 98.1 F (36.7 C)  TempSrc: Oral  SpO2: 98%  Weight: 200 lb 9.6 oz (91 kg)  Height: 5\' 4"  (1.626 m)    Physical Exam  Constitutional: She is oriented to person, place, and time and well-developed, well-nourished, and in no distress.  HENT:  Head: Normocephalic and atraumatic.  Cardiovascular: Normal rate and regular rhythm.   Pulmonary/Chest: Effort normal and breath sounds normal. She has no wheezes.  Abdominal: Soft. Bowel sounds are normal.  Musculoskeletal:  1+ pitting edema bilaterally.  Neurological: She is alert and oriented to person, place, and time.  Skin: Skin is warm and dry.  Psychiatric: Mood, memory, affect and judgment normal.  Nursing note and vitals reviewed.      Assessment & Plan  1. Essential hypertension BP stable and controlled on present antihypertensive therapy - triamterene-hydrochlorothiazide (MAXZIDE-25) 37.5-25 MG tablet; Take 1 tablet by mouth daily. 37.5-25  Dispense: 90 tablet; Refill: 0  2. Generalized anxiety disorder Taking alprazolam up to twice daily when needed, refills provided - ALPRAZolam (XANAX) 0.25 MG tablet; Take 1 tablet (0.25 mg total) by mouth 2 (two) times daily as needed for anxiety.  Dispense: 60 tablet; Refill: 2  3. Diuretic-induced hypokalemia  - potassium chloride SA (K-DUR,KLOR-CON) 20 MEQ tablet; Take 1 tablet (20 mEq total) by mouth daily.  Dispense: 90 tablet; Refill: 0 - COMPLETE METABOLIC PANEL WITH GFR  4. Hyperlipidemia, unspecified hyperlipidemia type We will obtain FLP, now on pravastatin 10 mg daily. - Lipid Profile   Theresa Andrews [facility] 06/29/2016 9:46  AM

## 2016-09-07 ENCOUNTER — Ambulatory Visit: Payer: Medicare Other

## 2016-09-24 ENCOUNTER — Telehealth: Payer: Self-pay | Admitting: Family Medicine

## 2016-09-24 DIAGNOSIS — I10 Essential (primary) hypertension: Secondary | ICD-10-CM

## 2016-09-24 NOTE — Telephone Encounter (Signed)
Pt has appointment for 09-29-16 but she is only have 1 pill left of losartan and 5 pills left of amlodipine. Please send enough to [facility]

## 2016-09-26 ENCOUNTER — Other Ambulatory Visit: Payer: Self-pay | Admitting: Family Medicine

## 2016-09-26 DIAGNOSIS — I10 Essential (primary) hypertension: Secondary | ICD-10-CM

## 2016-09-27 MED ORDER — LOSARTAN POTASSIUM 100 MG PO TABS: 100.0000 mg | ORAL_TABLET | Freq: Every day | ORAL | 0 refills | Status: DC

## 2016-09-27 MED ORDER — AMLODIPINE BESYLATE 10 MG PO TABS: 10.0000 mg | ORAL_TABLET | Freq: Every day | ORAL | 0 refills | Status: DC

## 2016-09-27 NOTE — Telephone Encounter (Signed)
Prescription for amlodipine and losartan has been sent to patient's pharmacy.

## 2016-09-28 NOTE — Telephone Encounter (Signed)
Pt verbally informed. °

## 2016-09-29 ENCOUNTER — Encounter: Payer: Self-pay | Admitting: Family Medicine

## 2016-09-29 ENCOUNTER — Ambulatory Visit (INDEPENDENT_AMBULATORY_CARE_PROVIDER_SITE_OTHER): Payer: Medicare Other | Admitting: Family Medicine

## 2016-09-29 VITALS — BP 132/84 | HR 100 | Temp 98.2°F | Resp 16 | Ht 64.0 in | Wt 201.4 lb

## 2016-09-29 DIAGNOSIS — T502X5A Adverse effect of carbonic-anhydrase inhibitors, benzothiadiazides and other diuretics, initial encounter: Secondary | ICD-10-CM | POA: Diagnosis not present

## 2016-09-29 DIAGNOSIS — I10 Essential (primary) hypertension: Secondary | ICD-10-CM

## 2016-09-29 DIAGNOSIS — E7800 Pure hypercholesterolemia, unspecified: Secondary | ICD-10-CM | POA: Diagnosis not present

## 2016-09-29 DIAGNOSIS — R7303 Prediabetes: Secondary | ICD-10-CM | POA: Diagnosis not present

## 2016-09-29 DIAGNOSIS — E876 Hypokalemia: Secondary | ICD-10-CM | POA: Diagnosis not present

## 2016-09-29 LAB — COMPLETE METABOLIC PANEL WITH GFR
ALBUMIN: 4.1 g/dL (ref 3.6–5.1)
ALK PHOS: 105 U/L (ref 33–130)
ALT: 10 U/L (ref 6–29)
AST: 11 U/L (ref 10–35)
BILIRUBIN TOTAL: 0.4 mg/dL (ref 0.2–1.2)
BUN: 16 mg/dL (ref 7–25)
CO2: 28 mmol/L (ref 20–31)
CREATININE: 0.9 mg/dL (ref 0.50–0.99)
Calcium: 11 mg/dL — ABNORMAL HIGH (ref 8.6–10.4)
Chloride: 102 mmol/L (ref 98–110)
GFR, EST AFRICAN AMERICAN: 75 mL/min (ref >=60)
GFR, Est Non African American: 65 mL/min (ref >=60)
Glucose, Bld: 123 mg/dL — ABNORMAL HIGH (ref 65–99)
Potassium: 4 mmol/L (ref 3.5–5.3)
Sodium: 139 mmol/L (ref 135–146)
TOTAL PROTEIN: 7.3 g/dL (ref 6.1–8.1)

## 2016-09-29 LAB — LIPID PANEL
CHOLESTEROL: 207 mg/dL — AB (ref <200)
HDL: 47 mg/dL — ABNORMAL LOW (ref >50)
LDL Cholesterol: 139 mg/dL — ABNORMAL HIGH (ref <100)
Total CHOL/HDL Ratio: 4.4 Ratio (ref <5.0)
Triglycerides: 107 mg/dL (ref <150)
VLDL: 21 mg/dL (ref <30)

## 2016-09-29 LAB — GLUCOSE, POCT (MANUAL RESULT ENTRY): POC Glucose: 117 mg/dl — AB (ref 70–99)

## 2016-09-29 LAB — POCT GLYCOSYLATED HEMOGLOBIN (HGB A1C): HEMOGLOBIN A1C: 6.3

## 2016-09-29 MED ORDER — TRIAMTERENE-HCTZ 37.5-25 MG PO TABS: 1.0000 | ORAL_TABLET | Freq: Every day | ORAL | 0 refills | Status: DC

## 2016-09-29 MED ORDER — POTASSIUM CHLORIDE CRYS ER 20 MEQ PO TBCR: 20.0000 meq | EXTENDED_RELEASE_TABLET | Freq: Every day | ORAL | 0 refills | Status: DC

## 2016-09-29 NOTE — Progress Notes (Signed)
Name: Theresa Andrews   MRN: [id number]    DOB: 07/15/1946   Date:09/29/2016       Progress Note  Subjective  Chief Complaint  Chief Complaint  Patient presents with  . Hypertension  . Medication Refill    Hypertension  This is a chronic problem. The problem is unchanged. The problem is controlled. Pertinent negatives include no blurred vision, chest pain, headaches or palpitations. Past treatments include calcium channel blockers, diuretics and angiotensin blockers. There are no compliance problems.  There is no history of kidney disease or CAD/MI.  Hyperlipidemia  This is a chronic problem. The problem is uncontrolled. Recent lipid tests were reviewed and are normal. Associated symptoms include myalgias (she has pain in her muscle and bones at night). Pertinent negatives include no chest pain. Current antihyperlipidemic treatment includes statins. Compliance problems include medication side effects (now taking 1/2 tablet every day).  Risk factors for coronary artery disease include diabetes mellitus and dyslipidemia.     Past Medical History:  Diagnosis Date  . Anxiety   . Breast cancer ([place])    pT2 pN0 cM0 (stage IIA) invasive mammary carcinoma of left outer breast status post lumpectomy and sentinel node study on July 14, 2010  . CAD (coronary artery disease)   . Depression   . H/O hypokalemia   . History of chemotherapy   . History of MI (myocardial infarction)   . Hyperglycemia   . Hyperlipidemia   . Hypertension   . Peripheral neuropathy due to chemotherapy [facility])     Past Surgical History:  Procedure Laterality Date  . ABDOMINAL HYSTERECTOMY    . BREAST BIOPSY Left 2008   neg  . BREAST EXCISIONAL BIOPSY Left 2011   +  . CHOLECYSTECTOMY    . COLONOSCOPY WITH PROPOFOL N/A 11/22/2015   Procedure: COLONOSCOPY WITH PROPOFOL;  Surgeon: Hulen Luster, MD;  Location: [facility] ENDOSCOPY;  Service: Gastroenterology;  Laterality: N/A;  . CORONARY ANGIOPLASTY WITH STENT PLACEMENT   2008    Family History  Problem Relation Age of Onset  . Breast cancer Sister   . Hypertension Mother   . Hypertension Father   . Heart attack Sister   . Stomach cancer Sister     Social History   Social History  . Marital status: Divorced    Spouse name: N/A  . Number of children: N/A  . Years of education: N/A   Occupational History  . Not on file.   Social History Main Topics  . Smoking status: Former Research scientist (life sciences)  . Smokeless tobacco: Never Used  . Alcohol use No  . Drug use: No  . Sexual activity: Not on file   Other Topics Concern  . Not on file   Social History Narrative  . No narrative on file     Current Outpatient Prescriptions:  .  ALPRAZolam (XANAX) 0.25 MG tablet, Take 1 tablet (0.25 mg total) by mouth 2 (two) times daily as needed for anxiety., Disp: 60 tablet, Rfl: 2 .  amLODipine (NORVASC) 10 MG tablet, Take 1 tablet (10 mg total) by mouth daily., Disp: 90 tablet, Rfl: 0 .  aspirin 81 MG EC tablet, Take 81 mg by mouth daily., Disp: , Rfl:  .  losartan (COZAAR) 100 MG tablet, Take 1 tablet (100 mg total) by mouth daily., Disp: 90 tablet, Rfl: 0 .  potassium chloride SA (K-DUR,KLOR-CON) 20 MEQ tablet, Take 1 tablet (20 mEq total) by mouth daily., Disp: 90 tablet, Rfl: 0 .  pravastatin (PRAVACHOL) 20  MG tablet, Take 1 tablet (20 mg total) by mouth every other day., Disp: 90 tablet, Rfl: 0 .  triamterene-hydrochlorothiazide (MAXZIDE-25) 37.5-25 MG tablet, Take 1 tablet by mouth daily. 37.5-25, Disp: 90 tablet, Rfl: 0 .  tamoxifen (NOLVADEX) 10 MG tablet, Take by mouth., Disp: , Rfl:   Allergies  Allergen Reactions  . Shellfish Allergy      Review of Systems  Eyes: Negative for blurred vision.  Cardiovascular: Negative for chest pain and palpitations.  Musculoskeletal: Positive for myalgias (she has pain in her muscle and bones at night).  Neurological: Negative for headaches.      Objective  Vitals:   09/29/16 1136  BP: 132/84  Pulse: 100   Resp: 16  Temp: 98.2 F (36.8 C)  TempSrc: Oral  SpO2: 98%  Weight: 201 lb 6.4 oz (91.4 kg)  Height: 5\' 4"  (1.626 m)    Physical Exam  Constitutional: She is oriented to person, place, and time and well-developed, well-nourished, and in no distress.  HENT:  Head: Normocephalic and atraumatic.  Cardiovascular: Normal rate and regular rhythm.   Pulmonary/Chest: Effort normal and breath sounds normal. She has no wheezes.  Abdominal: Soft. Bowel sounds are normal.  Musculoskeletal: She exhibits edema (trace pitting edema bilaterally.).  Neurological: She is alert and oriented to person, place, and time.  Skin: Skin is warm and dry.  Psychiatric: Mood, memory, affect and judgment normal.  Nursing note and vitals reviewed.      Assessment & Plan  1. Essential hypertension BP stable on present anti- hypertensive therapy - triamterene-hydrochlorothiazide (MAXZIDE-25) 37.5-25 MG tablet; Take 1 tablet by mouth daily. 37.5-25  Dispense: 90 tablet; Refill: 0  2. Diuretic-induced hypokalemia  - potassium chloride SA (K-DUR,KLOR-CON) 20 MEQ tablet; Take 1 tablet (20 mEq total) by mouth daily.  Dispense: 90 tablet; Refill: 0  3. Prediabetes A1c 6.3%, no pharmacotherapy indicated, encouraged on dietary and lifestyle changes - POCT glycosylated hemoglobin (Hb A1C)  4. Pure hypercholesterolemia Appears to be having side effects to statins, consider changing after review of updated FLP - Lipid panel - COMPLETE METABOLIC PANEL WITH GFR   Alexxis Mackert [name] 09/29/2016 11:38 AM

## 2016-11-24 ENCOUNTER — Ambulatory Visit
Admission: RE | Admit: 2016-11-24 | Discharge: 2016-11-24 | Disposition: A | Discharge status: Home / Self Care | Payer: Medicare Other | Source: Ambulatory Visit | Attending: Internal Medicine | Admitting: Internal Medicine

## 2016-11-24 DIAGNOSIS — C50812 Malignant neoplasm of overlapping sites of left female breast: Secondary | ICD-10-CM

## 2016-11-24 DIAGNOSIS — Z9889 Other specified postprocedural states: Secondary | ICD-10-CM | POA: Insufficient documentation

## 2016-11-24 DIAGNOSIS — Z853 Personal history of malignant neoplasm of breast: Secondary | ICD-10-CM | POA: Diagnosis not present

## 2016-12-02 ENCOUNTER — Inpatient Hospital Stay (HOSPITAL_BASED_OUTPATIENT_CLINIC_OR_DEPARTMENT_OTHER): Payer: Medicare Other | Admitting: Internal Medicine

## 2016-12-02 ENCOUNTER — Inpatient Hospital Stay: Payer: Medicare Other | Attending: Internal Medicine

## 2016-12-02 VITALS — BP 146/90 | HR 79 | Temp 97.6°F | Resp 18 | Ht 64.0 in | Wt 205.6 lb

## 2016-12-02 DIAGNOSIS — I10 Essential (primary) hypertension: Secondary | ICD-10-CM | POA: Insufficient documentation

## 2016-12-02 DIAGNOSIS — Z7982 Long term (current) use of aspirin: Secondary | ICD-10-CM | POA: Diagnosis not present

## 2016-12-02 DIAGNOSIS — I2510 Atherosclerotic heart disease of native coronary artery without angina pectoris: Secondary | ICD-10-CM

## 2016-12-02 DIAGNOSIS — I252 Old myocardial infarction: Secondary | ICD-10-CM | POA: Diagnosis not present

## 2016-12-02 DIAGNOSIS — C50812 Malignant neoplasm of overlapping sites of left female breast: Secondary | ICD-10-CM

## 2016-12-02 DIAGNOSIS — Z853 Personal history of malignant neoplasm of breast: Secondary | ICD-10-CM | POA: Diagnosis not present

## 2016-12-02 DIAGNOSIS — R635 Abnormal weight gain: Secondary | ICD-10-CM | POA: Insufficient documentation

## 2016-12-02 DIAGNOSIS — Z79899 Other long term (current) drug therapy: Secondary | ICD-10-CM | POA: Diagnosis not present

## 2016-12-02 DIAGNOSIS — E785 Hyperlipidemia, unspecified: Secondary | ICD-10-CM

## 2016-12-02 DIAGNOSIS — Z9223 Personal history of estrogen therapy: Secondary | ICD-10-CM | POA: Diagnosis not present

## 2016-12-02 DIAGNOSIS — Z87891 Personal history of nicotine dependence: Secondary | ICD-10-CM

## 2016-12-02 DIAGNOSIS — Z9221 Personal history of antineoplastic chemotherapy: Secondary | ICD-10-CM | POA: Insufficient documentation

## 2016-12-02 DIAGNOSIS — Z170 Estrogen receptor positive status [ER+]: Secondary | ICD-10-CM | POA: Diagnosis not present

## 2016-12-02 LAB — COMPREHENSIVE METABOLIC PANEL
ALT: 12 U/L — AB (ref 14–54)
AST: 16 U/L (ref 15–41)
Albumin: 4 g/dL (ref 3.5–5.0)
Alkaline Phosphatase: 96 U/L (ref 38–126)
Anion gap: 7 (ref 5–15)
BUN: 15 mg/dL (ref 6–20)
CALCIUM: 11.3 mg/dL — AB (ref 8.9–10.3)
CO2: 25 mmol/L (ref 22–32)
CREATININE: 0.69 mg/dL (ref 0.44–1.00)
Chloride: 106 mmol/L (ref 101–111)
Glucose, Bld: 144 mg/dL — ABNORMAL HIGH (ref 65–99)
Potassium: 3.5 mmol/L (ref 3.5–5.1)
SODIUM: 138 mmol/L (ref 135–145)
TOTAL PROTEIN: 7.9 g/dL (ref 6.5–8.1)
Total Bilirubin: 0.4 mg/dL (ref 0.3–1.2)

## 2016-12-02 LAB — CBC WITH DIFFERENTIAL/PLATELET
BASOS ABS: 0.1 10*3/uL (ref 0–0.1)
Basophils Relative: 1 %
EOS PCT: 7 %
Eosinophils Absolute: 0.4 10*3/uL (ref 0–0.7)
HEMATOCRIT: 43.4 % (ref 35.0–47.0)
Hemoglobin: 14.9 g/dL (ref 12.0–16.0)
LYMPHS ABS: 2.4 10*3/uL (ref 1.0–3.6)
LYMPHS PCT: 36 %
MCH: 30.5 pg (ref 26.0–34.0)
MCHC: 34.3 g/dL (ref 32.0–36.0)
MCV: 89 fL (ref 80.0–100.0)
MONO ABS: 0.5 10*3/uL (ref 0.2–0.9)
MONOS PCT: 8 %
Neutro Abs: 3.2 10*3/uL (ref 1.4–6.5)
Neutrophils Relative %: 48 %
PLATELETS: 301 10*3/uL (ref 150–440)
RBC: 4.88 MIL/uL (ref 3.80–5.20)
RDW: 14.5 % (ref 11.5–14.5)
WBC: 6.6 10*3/uL (ref 3.6–11.0)

## 2016-12-02 LAB — TSH: TSH: 5.476 u[IU]/mL — AB (ref 0.350–4.500)

## 2016-12-02 NOTE — Progress Notes (Signed)
Patient here for breast cancer follow-up. She continues to use essential oils (frankincence and myrrh) to improve her neuropathy. States her neuropathic pain in feet is often worse at bedtime.

## 2016-12-02 NOTE — Assessment & Plan Note (Signed)
#  LEFT BREAST CA- STAGE II- ER/PR positive HER-2/neu negative. s/p tamoxifen/antihormone therapy since 2012; stopped in May 2017. Clinically no evidence of recurrence noted. Mammo- may 2018-WNL. Declined extended anti-hormone therapy.   # Hypercalcemia- chronic [since 2016]- 11.3 intermittent. Bone scan 2017 negative for malignancy. Question related to diuretic recommend talking to her PCP regarding switching/further workup  # Weight gain- in spite of exercise; check TSH today.   # PN- G-1 on essential oils.   # follow up in 12 months;/ labs;  will call with results if abnormal. Ordered mammo.   # cc; Dr.Shah.

## 2016-12-02 NOTE — Progress Notes (Signed)
[place] OFFICE PROGRESS NOTE  Patient Care Team: Roselee Nova, MD as PCP - General (Family Medicine)   SUMMARY OF ONCOLOGIC HISTORY: Oncology History   # AUG 2011- LEFT BREAST CA Stage II ER/PR- Pos; her 2 NEG; s/p neo-adj ACx4- poor response; s/p lumpec [Dr.Arru]- ypT2 [2.5cm]ypsN-0/2; Taxol weekly 9/12 [sec to PN]; finished march 2012. Arimidex-May 2012; Aug 2013- changed to Aug 2013; sep 2013- Started Tam; Mammo- in April 2017.   # Declines extended anti-hormone therapy.   # Feb 2017- Bone scan- Negative [hypercalcemia] ; mammogram May 2018-wnl.   # Hypercalcemia- chronic [2016] ? Sec to HCTZ  # PN- G-1     Breast CA ([place])   10/29/2014 Initial Diagnosis    Breast CA ([place])      Carcinoma of overlapping sites of left breast in female, estrogen receptor positive ([place])   03/27/2016 Initial Diagnosis    Cancer of overlapping sites of left female breast [facility])       INTERVAL HISTORY:  71 year old very pleasant African-American female patient with above history of stage II breast cancer ER/PR positive HER-2/neu negative-is here for follow-up. Patient stopped taking tamoxifen in May 2017.  Patient complains of weight gain; in spite of trying to lose weight. She has increased her activity levels. Denies any worsening swelling in the legs. Patient denies any new lumps or bumps. Her appetite is good. She is not losing any weight. No vaginal discharge. No constipation no dizziness no headaches. Chronic mild tingling and numbness of the extremities-for which she uses essential oils. Denies any constipation or worsening bone pain.  REVIEW OF SYSTEMS:  A complete 10 point review of system is done which is negative except mentioned above/history of present illness.   PAST MEDICAL HISTORY :  Past Medical History:  Diagnosis Date  . Anxiety   . Breast cancer ([place])    pT2 pN0 cM0 (stage IIA) invasive mammary carcinoma of left outer breast status post lumpectomy and  sentinel node study on July 14, 2010  . CAD (coronary artery disease)   . Depression   . H/O hypokalemia   . History of chemotherapy   . History of MI (myocardial infarction)   . Hyperglycemia   . Hyperlipidemia   . Hypertension   . Peripheral neuropathy due to chemotherapy [facility])     PAST SURGICAL HISTORY :   Past Surgical History:  Procedure Laterality Date  . ABDOMINAL HYSTERECTOMY    . BREAST BIOPSY Left 2008   neg  . BREAST EXCISIONAL BIOPSY Left 2011   +  . CHOLECYSTECTOMY    . COLONOSCOPY WITH PROPOFOL N/A 11/22/2015   Procedure: COLONOSCOPY WITH PROPOFOL;  Surgeon: Hulen Luster, MD;  Location: [facility] ENDOSCOPY;  Service: Gastroenterology;  Laterality: N/A;  . CORONARY ANGIOPLASTY WITH STENT PLACEMENT  2008    FAMILY HISTORY :   Family History  Problem Relation Age of Onset  . Breast cancer Sister   . Hypertension Mother   . Hypertension Father   . Heart attack Sister   . Stomach cancer Sister     SOCIAL HISTORY:   Social History  Substance Use Topics  . Smoking status: Former Research scientist (life sciences)  . Smokeless tobacco: Never Used  . Alcohol use No    ALLERGIES:  is allergic to shellfish allergy.  MEDICATIONS:  Current Outpatient Prescriptions  Medication Sig Dispense Refill  . ALPRAZolam (XANAX) 0.25 MG tablet Take 1 tablet (0.25 mg total) by mouth 2 (two) times daily as needed for  anxiety. 60 tablet 2  . amLODipine (NORVASC) 10 MG tablet Take 1 tablet (10 mg total) by mouth daily. 90 tablet 0  . aspirin 81 MG EC tablet Take 81 mg by mouth daily.    Marland Kitchen ibuprofen (ADVIL,MOTRIN) 200 MG tablet Take 400 mg by mouth every 6 (six) hours as needed.    Marland Kitchen losartan (COZAAR) 100 MG tablet Take 1 tablet (100 mg total) by mouth daily. 90 tablet 0  . potassium chloride SA (K-DUR,KLOR-CON) 20 MEQ tablet Take 1 tablet (20 mEq total) by mouth daily. 90 tablet 0  . pravastatin (PRAVACHOL) 20 MG tablet Take 1 tablet (20 mg total) by mouth every other day. 90 tablet 0  .  triamterene-hydrochlorothiazide (MAXZIDE-25) 37.5-25 MG tablet Take 1 tablet by mouth daily. 37.5-25 90 tablet 0   No current facility-administered medications for this visit.     PHYSICAL EXAMINATION: ECOG PERFORMANCE STATUS: 0 - Asymptomatic  BP (!) 146/90 (BP Location: Right Arm, Patient Position: Sitting)   Pulse 79   Temp 97.6 F (36.4 C) (Tympanic)   Resp 18   Ht _0  (1.626 m)   Wt 205 lb 9.6 oz (93.3 kg)   BMI 35.29 kg/m   Filed Weights   12/02/16 1013  Weight: 205 lb 9.6 oz (93.3 kg)    GENERAL: Well-nourished well-developed; Alert, no distress and comfortable.   EYES: no pallor or icterus OROPHARYNX: no thrush or ulceration; good dentition  NECK: supple, no masses felt LYMPH:  no palpable lymphadenopathy in the cervical, axillary or inguinal regions LUNGS: clear to auscultation and  No wheeze or crackles HEART/CVS: regular rate & rhythm and no murmurs; No lower extremity edema ABDOMEN:abdomen soft, non-tender and normal bowel sounds Musculoskeletal:no cyanosis of digits and no clubbing  PSYCH: alert & oriented x 3 with fluent speech NEURO: no focal motor/sensory deficits SKIN:  no rashes or significant lesions  Right and left BREAST exam [in the presence of nurse]- no unusual skin changes or dominant masses felt. Surgical scars noted.     LABORATORY DATA:  I have reviewed the data as listed    Component Value Date/Time   NA 138 12/02/2016 0955   NA 141 08/20/2015 1143   K 3.5 12/02/2016 0955   CL 106 12/02/2016 0955   CO2 25 12/02/2016 0955   GLUCOSE 144 (H) 12/02/2016 0955   GLUCOSE 134 (H) 03/01/2013 1414   BUN 15 12/02/2016 0955   BUN 12 08/20/2015 1143   CREATININE 0.69 12/02/2016 0955   CREATININE 0.90 09/29/2016 1209   CALCIUM 11.3 (H) 12/02/2016 0955   PROT 7.9 12/02/2016 0955   PROT 7.5 08/20/2015 1143   PROT 7.5 03/02/2014 1034   ALBUMIN 4.0 12/02/2016 0955   ALBUMIN 4.7 08/20/2015 1143   ALBUMIN 3.5 03/02/2014 1034   AST 16 12/02/2016  0955   AST 13 (L) 03/02/2014 1034   ALT 12 (L) 12/02/2016 0955   ALT 21 03/02/2014 1034   ALKPHOS 96 12/02/2016 0955   ALKPHOS 76 03/02/2014 1034   BILITOT 0.4 12/02/2016 0955   BILITOT 0.3 08/20/2015 1143   BILITOT 0.2 03/02/2014 1034   GFRNONAA >60 12/02/2016 0955   GFRNONAA 65 09/29/2016 1209   GFRAA >60 12/02/2016 0955   GFRAA 75 09/29/2016 1209    No results found for: SPEP, UPEP  Lab Results  Component Value Date   WBC 6.6 12/02/2016   NEUTROABS 3.2 12/02/2016   HGB 14.9 12/02/2016   HCT 43.4 12/02/2016   MCV 89.0 12/02/2016  PLT 301 12/02/2016      Chemistry      Component Value Date/Time   NA 138 12/02/2016 0955   NA 141 08/20/2015 1143   K 3.5 12/02/2016 0955   CL 106 12/02/2016 0955   CO2 25 12/02/2016 0955   BUN 15 12/02/2016 0955   BUN 12 08/20/2015 1143   CREATININE 0.69 12/02/2016 0955   CREATININE 0.90 09/29/2016 1209      Component Value Date/Time   CALCIUM 11.3 (H) 12/02/2016 0955   ALKPHOS 96 12/02/2016 0955   ALKPHOS 76 03/02/2014 1034   AST 16 12/02/2016 0955   AST 13 (L) 03/02/2014 1034   ALT 12 (L) 12/02/2016 0955   ALT 21 03/02/2014 1034   BILITOT 0.4 12/02/2016 0955   BILITOT 0.3 08/20/2015 1143   BILITOT 0.2 03/02/2014 1034       RADIOGRAPHIC STUDIES: I have personally reviewed the radiological images as listed and agreed with the findings in the report. No results found.   ASSESSMENT & PLAN:   Carcinoma of overlapping sites of left breast in female, estrogen receptor positive ([place]) #LEFT BREAST CA- STAGE II- ER/PR positive HER-2/neu negative. s/p tamoxifen/antihormone therapy since 2012; stopped in May 2017. Clinically no evidence of recurrence noted. Mammo- may 2018-WNL. Declined extended anti-hormone therapy.   # Hypercalcemia- chronic [since 2016]- 11.3 intermittent. Bone scan 2017 negative for malignancy. Question related to diuretic recommend talking to her PCP regarding switching/further workup  # Weight gain- in spite  of exercise; check TSH today.   # PN- G-1 on essential oils.   # follow up in 12 months;/ labs;  will call with results if abnormal. Ordered mammo.   # cc; Dr.Shah.        Cammie Sickle, MD 12/02/2016 4:05 PM

## 2016-12-03 LAB — CANCER ANTIGEN 27.29: CA 27.29: 13.9 U/mL (ref 0.0–38.6)

## 2016-12-04 ENCOUNTER — Telehealth: Payer: Self-pay | Admitting: Family Medicine

## 2016-12-04 NOTE — Telephone Encounter (Signed)
Pt has appt for Thursday 12/10/16 and is requesting refill on pravastatin. Please send to [facility] [address]

## 2016-12-07 NOTE — Telephone Encounter (Signed)
Patient will need to discuss this medication with provider at next office visit on 12/10/2016, according to medication log in chart the last prescription for pravastatin 20 mg was 03/16/2016

## 2016-12-09 ENCOUNTER — Other Ambulatory Visit: Payer: Self-pay | Admitting: Family Medicine

## 2016-12-10 ENCOUNTER — Telehealth: Payer: Self-pay | Admitting: Family Medicine

## 2016-12-10 ENCOUNTER — Encounter: Payer: Self-pay | Admitting: Family Medicine

## 2016-12-10 ENCOUNTER — Ambulatory Visit (INDEPENDENT_AMBULATORY_CARE_PROVIDER_SITE_OTHER): Payer: Medicare Other | Admitting: Family Medicine

## 2016-12-10 VITALS — BP 141/77 | HR 103 | Temp 98.1°F | Resp 17 | Ht 64.0 in | Wt 204.1 lb

## 2016-12-10 DIAGNOSIS — R946 Abnormal results of thyroid function studies: Secondary | ICD-10-CM | POA: Diagnosis not present

## 2016-12-10 DIAGNOSIS — E7800 Pure hypercholesterolemia, unspecified: Secondary | ICD-10-CM | POA: Diagnosis not present

## 2016-12-10 DIAGNOSIS — F411 Generalized anxiety disorder: Secondary | ICD-10-CM

## 2016-12-10 DIAGNOSIS — R7989 Other specified abnormal findings of blood chemistry: Secondary | ICD-10-CM

## 2016-12-10 LAB — T4, FREE: Free T4: 1.1 ng/dL (ref 0.8–1.8)

## 2016-12-10 LAB — TSH: TSH: 3.59 mIU/L

## 2016-12-10 MED ORDER — CLONAZEPAM 0.25 MG PO TBDP: 0.2500 mg | ORAL_TABLET | Freq: Two times a day (BID) | ORAL | 0 refills | Status: DC

## 2016-12-10 MED ORDER — PRAVASTATIN SODIUM 20 MG PO TABS: 20.0000 mg | ORAL_TABLET | Freq: Every day | ORAL | 0 refills | Status: DC

## 2016-12-10 MED ORDER — CLONAZEPAM 0.5 MG PO TABS: 0.2500 mg | ORAL_TABLET | Freq: Two times a day (BID) | ORAL | 2 refills | Status: DC | PRN

## 2016-12-10 NOTE — Telephone Encounter (Signed)
Prescription for pravastatin is sent to patient's pharmacy

## 2016-12-10 NOTE — Progress Notes (Signed)
Name: Theresa Andrews   MRN: [id number]    DOB: [birth year]   Date:12/10/2016       Progress Note  Subjective  Chief Complaint  Chief Complaint  Patient presents with  . Follow-up    elevated TSH    Hyperlipidemia  This is a chronic problem. The problem is uncontrolled. Recent lipid tests were reviewed and are high. Exacerbating diseases include obesity. Pertinent negatives include no leg pain, myalgias or shortness of breath. Current antihyperlipidemic treatment includes statins. Compliance problems: She was not taking Pravastatin regularly prior to [place] for follow-up visit. Symptoms include insomnia and nervous/anxious behavior. Patient reports no depressed mood, excessive worry, panic or shortness of breath. The severity of symptoms is moderate.   Compliance with medications is 76-100%. Treatment side effects: she fels that Xanax makes her sluggish, wishes to be switched to a different agent, has taken Klonopin before which seemed to work well.   Patient had elevated TSH at 5.476 on lab work done by Dr. Burlene Arnt. She reports no symptoms associated with hypothyroidism such as fatigue, dry skin, or constipation, but she has gained a few pounds in the past few weeks despite being on a balanced low sugar diet.   Past Medical History:  Diagnosis Date  . Anxiety   . Breast cancer ([place])    pT2 pN0 cM0 (stage IIA) invasive mammary carcinoma of left outer breast status post lumpectomy and sentinel node study on July 14, 2010  . CAD (coronary artery disease)   . Depression   . H/O hypokalemia   . History of chemotherapy   . History of MI (myocardial infarction)   . Hyperglycemia   . Hyperlipidemia   . Hypertension   . Peripheral neuropathy due to chemotherapy [facility])     Past Surgical History:  Procedure Laterality Date  . ABDOMINAL HYSTERECTOMY    . BREAST BIOPSY Left 2008   neg  . BREAST EXCISIONAL BIOPSY Left 2011   +  . CHOLECYSTECTOMY    . COLONOSCOPY  WITH PROPOFOL N/A 11/22/2015   Procedure: COLONOSCOPY WITH PROPOFOL;  Surgeon: Hulen Luster, MD;  Location: [facility] ENDOSCOPY;  Service: Gastroenterology;  Laterality: N/A;  . CORONARY ANGIOPLASTY WITH STENT PLACEMENT  2008    Family History  Problem Relation Age of Onset  . Breast cancer Sister   . Hypertension Mother   . Hypertension Father   . Heart attack Sister   . Stomach cancer Sister     Social History   Social History  . Marital status: Divorced    Spouse name: N/A  . Number of children: N/A  . Years of education: N/A   Occupational History  . Not on file.   Social History Main Topics  . Smoking status: Former Research scientist (life sciences)  . Smokeless tobacco: Never Used  . Alcohol use No  . Drug use: No  . Sexual activity: Not on file   Other Topics Concern  . Not on file   Social History Narrative  . No narrative on file     Current Outpatient Prescriptions:  .  ALPRAZolam (XANAX) 0.25 MG tablet, Take 1 tablet (0.25 mg total) by mouth 2 (two) times daily as needed for anxiety., Disp: 60 tablet, Rfl: 2 .  amLODipine (NORVASC) 10 MG tablet, Take 1 tablet (10 mg total) by mouth daily., Disp: 90 tablet, Rfl: 0 .  aspirin 81 MG EC tablet, Take 81 mg by mouth daily., Disp: , Rfl:  .  ibuprofen (ADVIL,MOTRIN)  200 MG tablet, Take 400 mg by mouth every 6 (six) hours as needed., Disp: , Rfl:  .  losartan (COZAAR) 100 MG tablet, Take 1 tablet (100 mg total) by mouth daily., Disp: 90 tablet, Rfl: 0 .  potassium chloride SA (K-DUR,KLOR-CON) 20 MEQ tablet, Take 1 tablet (20 mEq total) by mouth daily., Disp: 90 tablet, Rfl: 0 .  pravastatin (PRAVACHOL) 20 MG tablet, Take 1 tablet (20 mg total) by mouth every other day., Disp: 90 tablet, Rfl: 0 .  triamterene-hydrochlorothiazide (MAXZIDE-25) 37.5-25 MG tablet, Take 1 tablet by mouth daily. 37.5-25, Disp: 90 tablet, Rfl: 0  Allergies  Allergen Reactions  . Shellfish Allergy      Review of Systems  Respiratory: Negative for shortness of breath.    Musculoskeletal: Negative for myalgias.  Psychiatric/Behavioral: The patient is nervous/anxious and has insomnia.      Objective  Vitals:   12/10/16 1021  BP: (!) 141/77  Pulse: (!) 103  Resp: 17  Temp: 98.1 F (36.7 C)  TempSrc: Oral  SpO2: 98%  Weight: 204 lb 1.6 oz (92.6 kg)  Height: 5\' 4"  (1.626 m)    Physical Exam  Constitutional: She is oriented to person, place, and time and well-developed, well-nourished, and in no distress.  HENT:  Head: Normocephalic and atraumatic.  Cardiovascular: Normal rate, regular rhythm and normal heart sounds.   No murmur heard. Pulmonary/Chest: Effort normal and breath sounds normal. She has no wheezes.  Abdominal: Soft. Bowel sounds are normal.  Neurological: She is alert and oriented to person, place, and time.  Psychiatric: Mood, memory, affect and judgment normal.  Nursing note and vitals reviewed.     Assessment & Plan  1. Pure hypercholesterolemia Patient has been taking pravastatin as prescribed, will repeat levels in 3 months - pravastatin (PRAVACHOL) 20 MG tablet; Take 1 tablet (20 mg total) by mouth daily.  Dispense: 90 tablet; Refill: 0  2. Generalized anxiety disorder Changed to clonazepam 0.25 mg twice a day as needed for anxiety. - clonazePAM (KLONOPIN) 0.5 MG tablet; Take 0.5 tablets (0.25 mg total) by mouth 2 (two) times daily as needed for anxiety.  Dispense: 30 tablet; Refill: 2  3. Abnormal TSH  - TSH - T4, free  Arik Husmann [name] 12/10/2016 10:41 AM

## 2016-12-10 NOTE — Telephone Encounter (Signed)
Pt called back after leaving asking for refills on her medications. Pt states she forgot.

## 2016-12-29 ENCOUNTER — Other Ambulatory Visit: Payer: Self-pay | Admitting: Family Medicine

## 2016-12-29 DIAGNOSIS — I10 Essential (primary) hypertension: Secondary | ICD-10-CM

## 2017-01-04 ENCOUNTER — Ambulatory Visit (INDEPENDENT_AMBULATORY_CARE_PROVIDER_SITE_OTHER): Payer: Medicare Other | Admitting: Family Medicine

## 2017-01-04 ENCOUNTER — Encounter: Payer: Self-pay | Admitting: Family Medicine

## 2017-01-04 ENCOUNTER — Ambulatory Visit: Payer: Medicare Other

## 2017-01-04 VITALS — BP 133/73 | HR 105 | Temp 98.2°F | Resp 17 | Ht 64.0 in | Wt 199.4 lb

## 2017-01-04 DIAGNOSIS — T502X5A Adverse effect of carbonic-anhydrase inhibitors, benzothiadiazides and other diuretics, initial encounter: Secondary | ICD-10-CM

## 2017-01-04 DIAGNOSIS — F411 Generalized anxiety disorder: Secondary | ICD-10-CM

## 2017-01-04 DIAGNOSIS — E876 Hypokalemia: Secondary | ICD-10-CM | POA: Diagnosis not present

## 2017-01-04 MED ORDER — PAROXETINE HCL 10 MG PO TABS: 10.0000 mg | ORAL_TABLET | Freq: Every day | ORAL | 0 refills | Status: DC

## 2017-01-04 MED ORDER — POTASSIUM CHLORIDE CRYS ER 20 MEQ PO TBCR: 20.0000 meq | EXTENDED_RELEASE_TABLET | Freq: Every day | ORAL | 0 refills | Status: DC

## 2017-01-04 NOTE — Progress Notes (Signed)
Name: Theresa Andrews   MRN: [id number]    DOB: 06-02-1946   Date:01/04/2017       Progress Note  Subjective  Chief Complaint  Chief Complaint  Patient presents with  . Follow-up    1 mo  . Medication Refill    potassium  . Medication Problem    Clonazepam    Anxiety  Presents for follow-up visit. Symptoms include excessive worry, irritability, nervous/anxious behavior and panic. Patient reports no depressed mood, shortness of breath or suicidal ideas. The severity of symptoms is moderate. The quality of sleep is fair.   Compliance with medications: has only taken Clonazepam 3 times made her drowsy, did not work on anxiety symptoms at all, would like to switch back to Paxil which she was on before.    Hypokalemia: Diuretic induced, last level was 3.5 1 month ago, no pertinent symptoms including muscle weakness, chest pain, or dizziness.  Would like to obtain refill for potassium.   Past Medical History:  Diagnosis Date  . Anxiety   . Breast cancer ([place])    pT2 pN0 cM0 (stage IIA) invasive mammary carcinoma of left outer breast status post lumpectomy and sentinel node study on July 14, 2010  . CAD (coronary artery disease)   . Depression   . H/O hypokalemia   . History of chemotherapy   . History of MI (myocardial infarction)   . Hyperglycemia   . Hyperlipidemia   . Hypertension   . Peripheral neuropathy due to chemotherapy [facility])     Past Surgical History:  Procedure Laterality Date  . ABDOMINAL HYSTERECTOMY    . BREAST BIOPSY Left 2008   neg  . BREAST EXCISIONAL BIOPSY Left 2011   +  . CHOLECYSTECTOMY    . COLONOSCOPY WITH PROPOFOL N/A 11/22/2015   Procedure: COLONOSCOPY WITH PROPOFOL;  Surgeon: Hulen Luster, MD;  Location: [facility] ENDOSCOPY;  Service: Gastroenterology;  Laterality: N/A;  . CORONARY ANGIOPLASTY WITH STENT PLACEMENT  2008    Family History  Problem Relation Age of Onset  . Breast cancer Sister   . Hypertension Mother   . Hypertension Father   .  Heart attack Sister   . Stomach cancer Sister     Social History   Social History  . Marital status: Divorced    Spouse name: N/A  . Number of children: N/A  . Years of education: N/A   Occupational History  . Not on file.   Social History Main Topics  . Smoking status: Former Research scientist (life sciences)  . Smokeless tobacco: Never Used  . Alcohol use No  . Drug use: No  . Sexual activity: Not on file   Other Topics Concern  . Not on file   Social History Narrative  . No narrative on file     Current Outpatient Prescriptions:  .  amLODipine (NORVASC) 10 MG tablet, TAKE 1 TABLET BY MOUTH ONCE DAILY, Disp: 90 tablet, Rfl: 0 .  aspirin 81 MG EC tablet, Take 81 mg by mouth daily., Disp: , Rfl:  .  clonazePAM (KLONOPIN) 0.5 MG tablet, Take 0.5 tablets (0.25 mg total) by mouth 2 (two) times daily as needed for anxiety., Disp: 30 tablet, Rfl: 2 .  ibuprofen (ADVIL,MOTRIN) 200 MG tablet, Take 400 mg by mouth every 6 (six) hours as needed., Disp: , Rfl:  .  losartan (COZAAR) 100 MG tablet, TAKE 1 TABLET BY MOUTH ONCE DAILY, Disp: 90 tablet, Rfl: 0 .  potassium chloride SA (K-DUR,KLOR-CON) 20 MEQ tablet, Take 1 tablet (  20 mEq total) by mouth daily., Disp: 90 tablet, Rfl: 0 .  pravastatin (PRAVACHOL) 20 MG tablet, Take 1 tablet (20 mg total) by mouth daily., Disp: 90 tablet, Rfl: 0 .  triamterene-hydrochlorothiazide (MAXZIDE-25) 37.5-25 MG tablet, Take 1 tablet by mouth daily. 37.5-25, Disp: 90 tablet, Rfl: 0  Allergies  Allergen Reactions  . Shellfish Allergy      Review of Systems  Constitutional: Positive for irritability.  Respiratory: Negative for shortness of breath.   Psychiatric/Behavioral: Negative for suicidal ideas. The patient is nervous/anxious.      Objective  Vitals:   01/04/17 1047  BP: 133/73  Pulse: (!) 105  Resp: 17  Temp: 98.2 F (36.8 C)  TempSrc: Oral  SpO2: 97%  Weight: 199 lb 6.4 oz (90.4 kg)  Height: 5\' 4"  (1.626 m)    Physical Exam  Constitutional: She  is oriented to person, place, and time and well-developed, well-nourished, and in no distress.  HENT:  Head: Normocephalic and atraumatic.  Cardiovascular: Normal rate, regular rhythm and normal heart sounds.   No murmur heard. Pulmonary/Chest: Effort normal and breath sounds normal. She has no wheezes.  Neurological: She is alert and oriented to person, place, and time.  Psychiatric: Mood, memory, affect and judgment normal.  Nursing note and vitals reviewed.     Assessment & Plan  1. Diuretic-induced hypokalemia Potassium levels were normal at last check, continue on potassium chloride - potassium chloride SA (K-DUR,KLOR-CON) 20 MEQ tablet; Take 1 tablet (20 mEq total) by mouth daily.  Dispense: 90 tablet; Refill: 0  2. Generalized anxiety disorder DC clonazepam and started on Paxil 10 mg daily, continue with present management and follow up in 3 months - PARoxetine (PAXIL) 10 MG tablet; Take 1 tablet (10 mg total) by mouth daily.  Dispense: 90 tablet; Refill: 0   Elfego Giammarino [name] 01/04/2017 11:00 AM

## 2017-01-12 ENCOUNTER — Ambulatory Visit: Payer: Medicare Other | Admitting: Family Medicine

## 2017-02-25 ENCOUNTER — Other Ambulatory Visit: Payer: Self-pay | Admitting: Family Medicine

## 2017-02-25 DIAGNOSIS — I10 Essential (primary) hypertension: Secondary | ICD-10-CM

## 2017-04-01 ENCOUNTER — Other Ambulatory Visit: Payer: Self-pay | Admitting: Family Medicine

## 2017-04-01 DIAGNOSIS — I10 Essential (primary) hypertension: Secondary | ICD-10-CM

## 2017-04-06 ENCOUNTER — Encounter: Payer: Self-pay | Admitting: Family Medicine

## 2017-04-06 ENCOUNTER — Ambulatory Visit (INDEPENDENT_AMBULATORY_CARE_PROVIDER_SITE_OTHER): Payer: Medicare Other | Admitting: Family Medicine

## 2017-04-06 VITALS — BP 135/78 | HR 102 | Temp 98.5°F | Resp 17 | Ht 64.0 in | Wt 187.7 lb

## 2017-04-06 DIAGNOSIS — R7303 Prediabetes: Secondary | ICD-10-CM

## 2017-04-06 DIAGNOSIS — E7800 Pure hypercholesterolemia, unspecified: Secondary | ICD-10-CM | POA: Diagnosis not present

## 2017-04-06 DIAGNOSIS — I10 Essential (primary) hypertension: Secondary | ICD-10-CM | POA: Diagnosis not present

## 2017-04-06 DIAGNOSIS — Z23 Encounter for immunization: Secondary | ICD-10-CM

## 2017-04-06 LAB — COMPLETE METABOLIC PANEL WITH GFR
AG RATIO: 1.6 (calc) (ref 1.0–2.5)
ALKALINE PHOSPHATASE (APISO): 93 U/L (ref 33–130)
ALT: 9 U/L (ref 6–29)
AST: 11 U/L (ref 10–35)
Albumin: 4.2 g/dL (ref 3.6–5.1)
BILIRUBIN TOTAL: 0.4 mg/dL (ref 0.2–1.2)
BUN: 15 mg/dL (ref 7–25)
CO2: 27 mmol/L (ref 20–32)
Calcium: 10.9 mg/dL — ABNORMAL HIGH (ref 8.6–10.4)
Chloride: 101 mmol/L (ref 98–110)
Creat: 0.75 mg/dL (ref 0.60–0.93)
GFR, EST AFRICAN AMERICAN: 94 mL/min/{1.73_m2} (ref >=60)
GFR, Est Non African American: 81 mL/min/{1.73_m2} (ref >=60)
Globulin: 2.7 g/dL (calc) (ref 1.9–3.7)
Glucose, Bld: 141 mg/dL — ABNORMAL HIGH (ref 65–99)
POTASSIUM: 3.7 mmol/L (ref 3.5–5.3)
Sodium: 139 mmol/L (ref 135–146)
TOTAL PROTEIN: 6.9 g/dL (ref 6.1–8.1)

## 2017-04-06 LAB — LIPID PANEL
CHOL/HDL RATIO: 3.9 (calc) (ref <5.0)
CHOLESTEROL: 188 mg/dL (ref <200)
HDL: 48 mg/dL — ABNORMAL LOW (ref >50)
LDL Cholesterol (Calc): 120 mg/dL (calc) — ABNORMAL HIGH
Non-HDL Cholesterol (Calc): 140 mg/dL (calc) — ABNORMAL HIGH (ref <130)
Triglycerides: 97 mg/dL (ref <150)

## 2017-04-06 LAB — POCT GLYCOSYLATED HEMOGLOBIN (HGB A1C): HEMOGLOBIN A1C: 6.4

## 2017-04-06 MED ORDER — PITAVASTATIN CALCIUM 2 MG PO TABS: 1.0000 | ORAL_TABLET | Freq: Every day | ORAL | 0 refills | Status: DC

## 2017-04-06 NOTE — Progress Notes (Signed)
Name: Theresa Andrews   MRN: [id number]    DOB: 02-11-1946   Date:04/06/2017       Progress Note  Subjective  Chief Complaint  Chief Complaint  Patient presents with  . Follow-up    3 mo  . Hyperlipidemia  . Hypertension    Hyperlipidemia  This is a chronic problem. The problem is uncontrolled. Recent lipid tests were reviewed and are high (elevated Total cholesterol and LDL cholesterol.). Pertinent negatives include no chest pain, leg pain, myalgias or shortness of breath. (Patient feels that statins are causing her to have hair loss.  ) Current antihyperlipidemic treatment includes statins. Risk factors for coronary artery disease include diabetes mellitus, dyslipidemia and obesity.  Hypertension  This is a chronic problem. The problem is unchanged. The problem is controlled. Pertinent negatives include no blurred vision, chest pain, headaches, orthopnea, palpitations or shortness of breath. Past treatments include calcium channel blockers, angiotensin blockers and diuretics.     Past Medical History:  Diagnosis Date  . Anxiety   . Breast cancer ([place])    pT2 pN0 cM0 (stage IIA) invasive mammary carcinoma of left outer breast status post lumpectomy and sentinel node study on July 14, 2010  . CAD (coronary artery disease)   . Depression   . H/O hypokalemia   . History of chemotherapy   . History of MI (myocardial infarction)   . Hyperglycemia   . Hyperlipidemia   . Hypertension   . Peripheral neuropathy due to chemotherapy [facility])     Past Surgical History:  Procedure Laterality Date  . ABDOMINAL HYSTERECTOMY    . BREAST BIOPSY Left 2008   neg  . BREAST EXCISIONAL BIOPSY Left 2011   +  . CHOLECYSTECTOMY    . COLONOSCOPY WITH PROPOFOL N/A 11/22/2015   Procedure: COLONOSCOPY WITH PROPOFOL;  Surgeon: Hulen Luster, MD;  Location: [facility] ENDOSCOPY;  Service: Gastroenterology;  Laterality: N/A;  . CORONARY ANGIOPLASTY WITH STENT PLACEMENT  2008    Family History  Problem  Relation Age of Onset  . Breast cancer Sister   . Hypertension Mother   . Hypertension Father   . Heart attack Sister   . Stomach cancer Sister     Social History   Social History  . Marital status: Divorced    Spouse name: N/A  . Number of children: N/A  . Years of education: N/A   Occupational History  . Not on file.   Social History Main Topics  . Smoking status: Former Research scientist (life sciences)  . Smokeless tobacco: Never Used  . Alcohol use No  . Drug use: No  . Sexual activity: Not on file   Other Topics Concern  . Not on file   Social History Narrative  . No narrative on file     Current Outpatient Prescriptions:  .  amLODipine (NORVASC) 10 MG tablet, TAKE 1 TABLET BY MOUTH ONCE DAILY, Disp: 90 tablet, Rfl: 0 .  aspirin 81 MG EC tablet, Take 81 mg by mouth daily., Disp: , Rfl:  .  ibuprofen (ADVIL,MOTRIN) 200 MG tablet, Take 400 mg by mouth every 6 (six) hours as needed., Disp: , Rfl:  .  losartan (COZAAR) 100 MG tablet, TAKE 1 TABLET BY MOUTH ONCE DAILY, Disp: 90 tablet, Rfl: 0 .  PARoxetine (PAXIL) 10 MG tablet, Take 1 tablet (10 mg total) by mouth daily., Disp: 90 tablet, Rfl: 0 .  potassium chloride SA (K-DUR,KLOR-CON) 20 MEQ tablet, Take 1 tablet (20 mEq total) by mouth daily., Disp: 90 tablet,  Rfl: 0 .  pravastatin (PRAVACHOL) 20 MG tablet, Take 1 tablet (20 mg total) by mouth daily., Disp: 90 tablet, Rfl: 0 .  triamterene-hydrochlorothiazide (MAXZIDE-25) 37.5-25 MG tablet, TAKE 1 TABLET BY MOUTH ONCE DAILY, Disp: 90 tablet, Rfl: 0  Allergies  Allergen Reactions  . Shellfish Allergy      Review of Systems  Eyes: Negative for blurred vision.  Respiratory: Negative for shortness of breath.   Cardiovascular: Negative for chest pain, palpitations and orthopnea.  Musculoskeletal: Negative for myalgias.  Neurological: Negative for headaches.      Objective  Vitals:   04/06/17 0942  BP: 135/78  Pulse: (!) 102  Resp: 17  Temp: 98.5 F (36.9 C)  TempSrc: Oral   SpO2: 96%  Weight: 187 lb 11.2 oz (85.1 kg)  Height: 5\' 4"  (1.626 m)    Physical Exam  Constitutional: She is oriented to person, place, and time and well-developed, well-nourished, and in no distress.  HENT:  Head: Normocephalic and atraumatic.  Cardiovascular: Normal rate, regular rhythm and normal heart sounds.   No murmur heard. Pulmonary/Chest: Effort normal and breath sounds normal. She has no wheezes.  Abdominal: Soft. Bowel sounds are normal. There is no tenderness.  Musculoskeletal: She exhibits no edema.  Neurological: She is alert and oriented to person, place, and time.  Psychiatric: Mood, memory, affect and judgment normal.  Nursing note and vitals reviewed.      Assessment & Plan  1. Need for influenza vaccination  - Flu vaccine HIGH DOSE PF (Fluzone High dose)  2. Essential hypertension Stable on present antihypertensive treatment  3. Pure hypercholesterolemia Changed to Pitavastatin 2 mg at bedtime due to possible adverse effects with pravastatin, recheck in 3 months - Pitavastatin Calcium 2 MG TABS; Take 1 tablet (2 mg total) by mouth at bedtime.  Dispense: 90 tablet; Refill: 0 - Lipid panel - COMPLETE METABOLIC PANEL WITH GFR  4. Prediabetes Point-of-care A1c 6.4%, consistent borderline diabetes. Encouraged dietary and lifestyle changes to avoid progression to full diabetes - POCT HgB A1C   Gerda Yin [name] 04/06/2017 9:57 AM

## 2017-04-07 ENCOUNTER — Telehealth: Payer: Self-pay | Admitting: Family Medicine

## 2017-04-07 NOTE — Telephone Encounter (Signed)
Pt informed

## 2017-04-07 NOTE — Telephone Encounter (Signed)
Prescription for Pitavastatin 2 mg daily was sent yesterday to [facility] on [address]. Please confirm

## 2017-04-07 NOTE — Telephone Encounter (Signed)
Pt called stating her cholesterol meds are not at the pharmacy. [facility] [address]

## 2017-04-28 ENCOUNTER — Telehealth: Payer: Self-pay | Admitting: Family Medicine

## 2017-04-28 DIAGNOSIS — E876 Hypokalemia: Secondary | ICD-10-CM

## 2017-04-28 DIAGNOSIS — T502X5A Adverse effect of carbonic-anhydrase inhibitors, benzothiadiazides and other diuretics, initial encounter: Principal | ICD-10-CM

## 2017-04-28 MED ORDER — POTASSIUM CHLORIDE CRYS ER 20 MEQ PO TBCR: 20.0000 meq | EXTENDED_RELEASE_TABLET | Freq: Every day | ORAL | 0 refills | Status: DC

## 2017-04-28 NOTE — Telephone Encounter (Signed)
Prescription for potassium chloride 20 mEq 1 tablet daily has been sent to patient's pharmacy

## 2017-04-28 NOTE — Telephone Encounter (Signed)
Pt needs refill on Potassium to be sent to [facility].

## 2017-04-29 NOTE — Telephone Encounter (Signed)
LMOM to inform pt °

## 2017-06-01 ENCOUNTER — Other Ambulatory Visit: Payer: Self-pay | Admitting: Family Medicine

## 2017-06-01 DIAGNOSIS — I10 Essential (primary) hypertension: Secondary | ICD-10-CM

## 2017-07-06 ENCOUNTER — Ambulatory Visit: Payer: Medicare Other

## 2017-07-06 ENCOUNTER — Ambulatory Visit: Payer: Medicare Other | Admitting: Family Medicine

## 2017-07-09 ENCOUNTER — Telehealth: Payer: Self-pay | Admitting: Family Medicine

## 2017-07-09 NOTE — Telephone Encounter (Signed)
Copied from [place]. Topic: Quick Communication - See Telephone Encounter >> Jul 09, 2017 11:10 AM Cleaster Corin, NT wrote: CRM for notification. See Telephone encounter for:   07/09/17. Pt. Stated that she will talk to someone in office on Monday she is unable to come in for her [place] during the times suggested.she wants to possibly schedule in January she wants to speak with Ammie (did let pt. Know that she is in office tues. And fri. As of the 17th)

## 2017-07-12 ENCOUNTER — Ambulatory Visit: Payer: Medicare Other | Admitting: Family Medicine

## 2017-07-13 NOTE — Telephone Encounter (Signed)
I had called Dorna Bloom for AWV and I will return her call today.  Sorry for delay.  Ammie gave me this last week! Thanks, Amy [facility]

## 2017-07-14 ENCOUNTER — Other Ambulatory Visit: Payer: Self-pay | Admitting: Family Medicine

## 2017-07-14 DIAGNOSIS — I10 Essential (primary) hypertension: Secondary | ICD-10-CM

## 2017-07-30 ENCOUNTER — Encounter: Payer: Self-pay | Admitting: Family Medicine

## 2017-07-30 ENCOUNTER — Ambulatory Visit (INDEPENDENT_AMBULATORY_CARE_PROVIDER_SITE_OTHER): Payer: Medicare Other

## 2017-07-30 ENCOUNTER — Ambulatory Visit: Payer: Medicare Other | Admitting: Family Medicine

## 2017-07-30 VITALS — BP 138/80 | HR 82 | Temp 98.7°F | Resp 16 | Ht 64.0 in | Wt 179.0 lb

## 2017-07-30 VITALS — BP 138/80 | HR 82 | Temp 98.7°F | Resp 16 | Ht 64.0 in | Wt 179.8 lb

## 2017-07-30 DIAGNOSIS — F411 Generalized anxiety disorder: Secondary | ICD-10-CM

## 2017-07-30 DIAGNOSIS — I10 Essential (primary) hypertension: Secondary | ICD-10-CM

## 2017-07-30 DIAGNOSIS — T502X5A Adverse effect of carbonic-anhydrase inhibitors, benzothiadiazides and other diuretics, initial encounter: Secondary | ICD-10-CM

## 2017-07-30 DIAGNOSIS — E876 Hypokalemia: Secondary | ICD-10-CM

## 2017-07-30 DIAGNOSIS — R7303 Prediabetes: Secondary | ICD-10-CM

## 2017-07-30 DIAGNOSIS — Z0000 Encounter for general adult medical examination without abnormal findings: Secondary | ICD-10-CM

## 2017-07-30 DIAGNOSIS — E7800 Pure hypercholesterolemia, unspecified: Secondary | ICD-10-CM | POA: Diagnosis not present

## 2017-07-30 LAB — LIPID PANEL
CHOLESTEROL: 209 mg/dL — AB (ref <200)
HDL: 48 mg/dL — AB (ref >50)
LDL Cholesterol (Calc): 138 mg/dL (calc) — ABNORMAL HIGH
Non-HDL Cholesterol (Calc): 161 mg/dL (calc) — ABNORMAL HIGH (ref <130)
Total CHOL/HDL Ratio: 4.4 (calc) (ref <5.0)
Triglycerides: 112 mg/dL (ref <150)

## 2017-07-30 LAB — COMPLETE METABOLIC PANEL WITH GFR
AG RATIO: 1.5 (calc) (ref 1.0–2.5)
ALT: 8 U/L (ref 6–29)
AST: 11 U/L (ref 10–35)
Albumin: 4.3 g/dL (ref 3.6–5.1)
Alkaline phosphatase (APISO): 85 U/L (ref 33–130)
BUN: 11 mg/dL (ref 7–25)
CALCIUM: 11.1 mg/dL — AB (ref 8.6–10.4)
CO2: 30 mmol/L (ref 20–32)
Chloride: 101 mmol/L (ref 98–110)
Creat: 0.78 mg/dL (ref 0.60–0.93)
GFR, EST AFRICAN AMERICAN: 89 mL/min/{1.73_m2} (ref >=60)
GFR, EST NON AFRICAN AMERICAN: 77 mL/min/{1.73_m2} (ref >=60)
GLOBULIN: 2.8 g/dL (ref 1.9–3.7)
Glucose, Bld: 125 mg/dL — ABNORMAL HIGH (ref 65–99)
POTASSIUM: 3.9 mmol/L (ref 3.5–5.3)
SODIUM: 139 mmol/L (ref 135–146)
TOTAL PROTEIN: 7.1 g/dL (ref 6.1–8.1)
Total Bilirubin: 0.4 mg/dL (ref 0.2–1.2)

## 2017-07-30 LAB — POCT GLYCOSYLATED HEMOGLOBIN (HGB A1C): Hemoglobin A1C: 6.1

## 2017-07-30 MED ORDER — TRIAMTERENE-HCTZ 37.5-25 MG PO TABS: 1.0000 | ORAL_TABLET | Freq: Every day | ORAL | 0 refills | Status: DC

## 2017-07-30 MED ORDER — PITAVASTATIN CALCIUM 2 MG PO TABS: 1.0000 | ORAL_TABLET | Freq: Every day | ORAL | 0 refills | Status: DC

## 2017-07-30 MED ORDER — PAROXETINE HCL 10 MG PO TABS: 10.0000 mg | ORAL_TABLET | Freq: Every day | ORAL | 0 refills | Status: DC

## 2017-07-30 MED ORDER — POTASSIUM CHLORIDE CRYS ER 20 MEQ PO TBCR: 20.0000 meq | EXTENDED_RELEASE_TABLET | Freq: Every day | ORAL | 0 refills | Status: DC

## 2017-07-30 NOTE — Progress Notes (Signed)
Subjective:   Theresa Andrews is a 72 y.o. female who presents for Medicare Annual (Subsequent) preventive examination.  Review of Systems:  N/A Cardiac Risk Factors include: advanced age (>60men, >33 women);hypertension;obesity (BMI >30kg/m2);dyslipidemia     Objective:     Vitals: BP 138/80 (BP Location: Left Arm, Patient Position: Sitting, Cuff Size: Large)   Pulse 82   Temp 98.7 F (37.1 C) (Oral)   Resp 16   Ht 5\' 4"  (1.626 m)   Wt 179 lb 12.8 oz (81.6 kg)   BMI 30.86 kg/m   Body mass index is 30.86 kg/m.  Advanced Directives 07/30/2017 04/06/2017 01/04/2017 12/10/2016 09/29/2016 06/29/2016 03/27/2016  Does Patient Have a Medical Advance Directive? No No No No No No No  Would patient like information on creating a medical advance directive? Yes (MAU/Ambulatory/Procedural Areas - Information given) - - - - - -   Pt has been provided with the "MOST" and "DNR" documents for her review. Pt has been advised that she will only need to complete the document that is most appropriate to suit her health care wishes. Once completed, pt has been advised to return the appropriate document to the office for the physician to review and sign. Verbalized acceptance and understanding.  Tobacco Social History   Tobacco Use  Smoking Status Former Smoker  . Packs/day: 0.25  . Years: 25.00  . Pack years: 6.25  . Types: Cigarettes  . Last attempt to quit: 2008  . Years since quitting: 11.0  Smokeless Tobacco Never Used  Tobacco Comment   smoking cessation materials not required     Counseling given: No Comment: smoking cessation materials not required   Clinical Intake:  Pre-visit preparation completed: Yes  Pain : No/denies pain  BMI - recorded: 30.86 Nutritional Status: BMI > 30  Obese Nutritional Risks: None Has the patient had any N/V/D within the last 2 months?  No Does the patient have any non-healing wounds?  No Has the patient had any unintentional weight loss or weight gain?   No Diabetes: Yes(Prediabetes) CBG done?: No Did pt. bring in CBG monitor from home?: No  How often do you need to have someone help you when you read instructions, pamphlets, or other written materials from your doctor or pharmacy?: 1 - Never  Interpreter Needed?: No  Information entered by :: AEversole, LPN  Past Medical History:  Diagnosis Date  . Anxiety   . Breast cancer ([place])    pT2 pN0 cM0 (stage IIA) invasive mammary carcinoma of left outer breast status post lumpectomy and sentinel node study on July 14, 2010  . CAD (coronary artery disease)   . Depression   . H/O hypokalemia   . History of chemotherapy   . History of MI (myocardial infarction)   . Hyperglycemia   . Hyperlipidemia   . Hypertension   . Peripheral neuropathy due to chemotherapy [facility])    Past Surgical History:  Procedure Laterality Date  . ABDOMINAL HYSTERECTOMY    . BREAST BIOPSY Left 2008   neg  . BREAST EXCISIONAL BIOPSY Left 2011   +  . CHOLECYSTECTOMY    . COLONOSCOPY WITH PROPOFOL N/A 11/22/2015   Procedure: COLONOSCOPY WITH PROPOFOL;  Surgeon: Hulen Luster, MD;  Location: [facility] ENDOSCOPY;  Service: Gastroenterology;  Laterality: N/A;  . CORONARY ANGIOPLASTY WITH STENT PLACEMENT  2008   Family History  Problem Relation Age of Onset  . Breast cancer Sister   . Hypertension Mother   . Aneurysm Mother   .  Heart attack Sister   . Birth defects Sister    Social History   Socioeconomic History  . Marital status: Divorced    Spouse name: None  . Number of children: 3  . Years of education: some college, no degree  . Highest education level: 12th grade  Social Needs  . Financial resource strain: Not hard at all  . Food insecurity - worry: Never true  . Food insecurity - inability: Never true  . Transportation needs - medical: No  . Transportation needs - non-medical: No  Occupational History    Employer: RETIRED  Tobacco Use  . Smoking status: Former Smoker    Packs/day: 0.25     Years: 25.00    Pack years: 6.25    Types: Cigarettes    Last attempt to quit: 2008    Years since quitting: 11.0  . Smokeless tobacco: Never Used  . Tobacco comment: smoking cessation materials not required  Substance and Sexual Activity  . Alcohol use: No    Comment: rare; holidays  . Drug use: No  . Sexual activity: Not Currently  Other Topics Concern  . None  Social History Narrative  . None    Outpatient Encounter Medications as of 07/30/2017  Medication Sig  . amLODipine (NORVASC) 10 MG tablet TAKE 1 TABLET BY MOUTH ONCE DAILY  . aspirin 81 MG EC tablet Take 81 mg by mouth daily.  Marland Kitchen ibuprofen (ADVIL,MOTRIN) 200 MG tablet Take 400 mg by mouth every 6 (six) hours as needed.  Marland Kitchen losartan (COZAAR) 100 MG tablet TAKE 1 TABLET BY MOUTH ONCE DAILY  . PARoxetine (PAXIL) 10 MG tablet Take 1 tablet (10 mg total) by mouth daily.  . potassium chloride SA (K-DUR,KLOR-CON) 20 MEQ tablet Take 1 tablet (20 mEq total) by mouth daily.  Marland Kitchen triamterene-hydrochlorothiazide (MAXZIDE-25) 37.5-25 MG tablet TAKE 1 TABLET BY MOUTH ONCE DAILY  . Pitavastatin Calcium 2 MG TABS Take 1 tablet (2 mg total) by mouth at bedtime.   No facility-administered encounter medications on file as of 07/30/2017.     Activities of Daily Living In your present state of health, do you have any difficulty performing the following activities: 07/30/2017 04/06/2017  Hearing? N N  Comment denies use of hearing aids -  Vision? N N  Comment wears eyeglasses -  Difficulty concentrating or making decisions? N N  Walking or climbing stairs? N N  Dressing or bathing? N N  Doing errands, shopping? N N  Preparing Food and eating ? N -  Comment upper partial and lower bridge -  Using the Toilet? N -  In the past six months, have you accidently leaked urine? N -  Do you have problems with loss of bowel control? N -  Managing your Medications? N -  Managing your Finances? N -  Housekeeping or managing your Housekeeping? N -  Some  recent data might be hidden    Patient Care Team: Roselee Nova, MD as PCP - General (Family Medicine) Cammie Sickle, MD as Consulting Physician (Internal Medicine) Corey Skains, MD as Consulting Physician (Cardiology)    Assessment:   This is a routine wellness examination for [place].  Exercise Activities and Dietary recommendations Current Exercise Habits: Structured exercise class, Type of exercise: treadmill, Time (Minutes): 30, Frequency (Times/Week): 7, Weekly Exercise (Minutes/Week): 210, Intensity: Mild, Exercise limited by: None identified  Goals    . DIET - INCREASE WATER INTAKE     Recommend to drink at least 6-8  8oz glasses of water per day        Fall Risk Fall Risk  07/30/2017 04/06/2017 01/04/2017 12/10/2016 09/29/2016  Falls in the past year? No No No No No   Is the patient's home free of loose throw rugs in walkways, pet beds, electrical cords, etc?   yes      Grab bars in the bathroom? yes  Denies use of shower chair      Handrails on the stairs?   yes      Adequate lighting?   yes   Denies use of a walker, cane and w/c. Also denies use an elevated toilet seat.  Timed Get Up and Go performed: Yes. Pt ambulated 10 feet within 10 sec. Gait steady without the use of an assistive device. No intervention required at this time.  Depression Screen PHQ 2/9 Scores 07/30/2017 04/06/2017 01/04/2017 12/10/2016  PHQ - 2 Score 0 0 0 0     Cognitive Function     6CIT Screen 07/30/2017  What Year? 0 points  What month? 0 points  What time? 0 points  Count back from 20 0 points  Months in reverse 0 points  Repeat phrase 0 points  Total Score 0    Immunization History  Administered Date(s) Administered  . Influenza, High Dose Seasonal PF 04/29/2015, 05/08/2016, 04/06/2017  . Influenza-Unspecified 05/09/2014  . Pneumococcal Conjugate-13 05/09/2014  . Pneumococcal Polysaccharide-23 05/08/2016  . Zoster 07/27/2012    Qualifies for Shingles Vaccine? No.  Zostavax completed 07/27/12. Due for Shingrix vaccine. Education has been provided regarding this vaccine. Pt has been advised to call her insurance company to determine her out of pocket expense. Advised she may also receive this vaccine at her local pharmacy or Health Dept. Verbalized acceptance and understanding.  Screening Tests Health Maintenance  Topic Date Due  . Hepatitis C Screening  1946/03/09  . FOOT EXAM  09/30/1956  . OPHTHALMOLOGY EXAM  09/30/1956  . HEMOGLOBIN A1C  10/04/2017  . MAMMOGRAM  11/24/2017  . COLONOSCOPY  11/21/2020  . INFLUENZA VACCINE  Completed  . PNA vac Low Risk Adult  Completed  . DEXA SCAN  Discontinued  . TETANUS/TDAP  Discontinued    Cancer Screenings: Lung: Low Dose CT Chest recommended if Age 108-80 years, 30 pack-year currently smoking OR have quit w/in 15years. Patient does qualify. An Epic message has been sent to Burgess Estelle, RN (Oncology Nurse Navigator) regarding the possible need for this exam. [name] will review the patient's chart and review all previous imaging, then will collaborate with radiology to determine if the patient truly qualifies for the exam. If the patient qualifies, [name] will order the Low Dose CT of the chest and will call the patient to schedule the exam.  Breast:  Up to date on Mammogram? Yes  Completed mammogram 11/24/16. Repeat mammogram every year.   Up to date of Bone Density/Dexa? Yes  Completed DEXA 07/27/09. No longer required to complete osteoporotic screening.  Colorectal: Completed colonoscopy 11/22/15. Repeat every 5 years  Additional Screenings: Hepatitis B/HIV/Syphillis: Declined. Education has been provided regarding this exam. Pt has been advised to call her insurance company to determine her out of pocket expense. Pt has been advised to call our office if she changes her mind. Verbalized acceptance and understanding.  Hepatitis C Screening: Declined. Education has been provided regarding this exam. Pt has been  advised to call her insurance company to determine her out of pocket expense. Pt has been advised to call our office if  she changes her mind. Verbalized acceptance and understanding.     Plan:   In preparation for this patient's Medicare Annual Wellness visit, I have personally reviewed the following information in the patient's chart:  A. Medical and Surgical History B. Office visits, Hospitalizations and ER visits within the last 12 months C. Radiology, Laboratory and Pathological Reports (including those records in [place]) D. Health Maintenance for accuracy and any attached, scanned or relevant reports or letters E.  Immunization History F.  Upcoming referrals and appointments G. Advance directives and Code Status  I have personally addressed the Medicare Wellness Questionnaire with the patient and have noted and/or up dated the following information:  A.  Current medications (including OTC medications) B. Medication Allergies C. Medical and Surgical History D.  Physicians on the patient's care team [place] Physical activity G. Functional ability and status H. Nutritional status I. Risk for fall and preventative measures (including TUG test) J. Use of alcohol, tobacco or illicit drugs  K.          Screenings such as hearing, vision, cognitive and depression L. Advance Directives and Code Status M. Realistic Patient Goals N. Financial and Social strains, if any  In addition, I have reviewed and discussed with the patient, certain preventive protocols, quality metrics, and best practice recommendations. To ensure quality care and proper  continuity of care, any concerns that arose during the Medicare Wellness visit were addressed with the patient's physician. A written personalized care plan for preventive services as well as general preventive health recommendations were provided to patient.  Signed,  Aleatha Borer, LPN

## 2017-07-30 NOTE — Progress Notes (Signed)
Name: Theresa Andrews   MRN: [id number]    DOB: 06/21/1946   Date:07/30/2017       Progress Note  Subjective  Chief Complaint  Chief Complaint  Patient presents with  . Annual Exam    Hyperlipidemia  This is a chronic problem. The problem is uncontrolled. Recent lipid tests were reviewed and are high (elevated Total cholesterol and LDL cholesterol.). Pertinent negatives include no leg pain, myalgias or shortness of breath. ( ) Current antihyperlipidemic treatment includes statins. There are no compliance problems (she has not taken Livalo since December when her prescription ran out).  Risk factors for coronary artery disease include diabetes mellitus, dyslipidemia and obesity.  Hypertension  This is a chronic problem. The problem is unchanged. The problem is controlled. Associated symptoms include anxiety. Pertinent negatives include no blurred vision, headaches, orthopnea, palpitations or shortness of breath. Past treatments include calcium channel blockers, angiotensin blockers and diuretics. Hypertensive end-organ damage includes CAD/MI. There is no history of kidney disease or CVA.  Anxiety  Presents for follow-up visit. Patient reports no depressed mood, dizziness, excessive worry, insomnia, nervous/anxious behavior, palpitations, panic or shortness of breath. The severity of symptoms is moderate.      Past Medical History:  Diagnosis Date  . Anxiety   . Breast cancer ([place])    pT2 pN0 cM0 (stage IIA) invasive mammary carcinoma of left outer breast status post lumpectomy and sentinel node study on July 14, 2010  . CAD (coronary artery disease)   . Depression   . H/O hypokalemia   . History of chemotherapy   . History of MI (myocardial infarction)   . Hyperglycemia   . Hyperlipidemia   . Hypertension   . Peripheral neuropathy due to chemotherapy [facility])     Past Surgical History:  Procedure Laterality Date  . ABDOMINAL HYSTERECTOMY    . BREAST BIOPSY Left 2008   neg  .  BREAST EXCISIONAL BIOPSY Left 2011   +  . CHOLECYSTECTOMY    . COLONOSCOPY WITH PROPOFOL N/A 11/22/2015   Procedure: COLONOSCOPY WITH PROPOFOL;  Surgeon: Hulen Luster, MD;  Location: [facility] ENDOSCOPY;  Service: Gastroenterology;  Laterality: N/A;  . CORONARY ANGIOPLASTY WITH STENT PLACEMENT  2008    Family History  Problem Relation Age of Onset  . Breast cancer Sister   . Hypertension Mother   . Aneurysm Mother   . Heart attack Sister   . Birth defects Sister     Social History   Socioeconomic History  . Marital status: Divorced    Spouse name: Not on file  . Number of children: 3  . Years of education: some college, no degree  . Highest education level: 12th grade  Social Needs  . Financial resource strain: Not hard at all  . Food insecurity - worry: Never true  . Food insecurity - inability: Never true  . Transportation needs - medical: No  . Transportation needs - non-medical: No  Occupational History    Employer: RETIRED  Tobacco Use  . Smoking status: Former Smoker    Packs/day: 0.25    Years: 25.00    Pack years: 6.25    Types: Cigarettes    Last attempt to quit: 2008    Years since quitting: 11.0  . Smokeless tobacco: Never Used  . Tobacco comment: smoking cessation materials not required  Substance and Sexual Activity  . Alcohol use: No    Comment: rare; holidays  . Drug use: No  . Sexual activity: Not Currently  Other Topics Concern  . Not on file  Social History Narrative  . Not on file     Current Outpatient Medications:  .  amLODipine (NORVASC) 10 MG tablet, TAKE 1 TABLET BY MOUTH ONCE DAILY, Disp: 90 tablet, Rfl: 0 .  aspirin 81 MG EC tablet, Take 81 mg by mouth daily., Disp: , Rfl:  .  ibuprofen (ADVIL,MOTRIN) 200 MG tablet, Take 400 mg by mouth every 6 (six) hours as needed., Disp: , Rfl:  .  losartan (COZAAR) 100 MG tablet, TAKE 1 TABLET BY MOUTH ONCE DAILY, Disp: 90 tablet, Rfl: 0 .  PARoxetine (PAXIL) 10 MG tablet, Take 1 tablet (10 mg total) by  mouth daily., Disp: 90 tablet, Rfl: 0 .  Pitavastatin Calcium 2 MG TABS, Take 1 tablet (2 mg total) by mouth at bedtime., Disp: 90 tablet, Rfl: 0 .  potassium chloride SA (K-DUR,KLOR-CON) 20 MEQ tablet, Take 1 tablet (20 mEq total) by mouth daily., Disp: 90 tablet, Rfl: 0 .  triamterene-hydrochlorothiazide (MAXZIDE-25) 37.5-25 MG tablet, TAKE 1 TABLET BY MOUTH ONCE DAILY, Disp: 90 tablet, Rfl: 0  Allergies  Allergen Reactions  . Tetanus Toxoids Shortness Of Breath and Rash  . Shellfish Allergy      Review of Systems  Eyes: Negative for blurred vision.  Respiratory: Negative for shortness of breath.   Cardiovascular: Negative for palpitations and orthopnea.  Musculoskeletal: Negative for myalgias.  Neurological: Negative for dizziness and headaches.  Psychiatric/Behavioral: The patient is not nervous/anxious and does not have insomnia.      Objective  Vitals:   07/30/17 1043  BP: 138/80  Pulse: 82  Resp: 16  Temp: 98.7 F (37.1 C)  TempSrc: Oral  SpO2: 98%  Weight: 179 lb (81.2 kg)  Height: 5\' 4"  (1.626 m)    Physical Exam  Constitutional: She is oriented to person, place, and time and well-developed, well-nourished, and in no distress.  HENT:  Head: Normocephalic and atraumatic.  Cardiovascular: Normal rate, regular rhythm and normal heart sounds.  No murmur heard. Pulmonary/Chest: Effort normal and breath sounds normal. She has no wheezes.  Musculoskeletal: She exhibits no edema.  Neurological: She is alert and oriented to person, place, and time.  Psychiatric: Mood, memory, affect and judgment normal.  Nursing note and vitals reviewed.    Assessment & Plan  1. Pure hypercholesterolemia Refill for Pitavastatin 2 mg provided, obtain FLP - Pitavastatin Calcium 2 MG TABS; Take 1 tablet (2 mg total) by mouth at bedtime.  Dispense: 90 tablet; Refill: 0 - Lipid panel - COMPLETE METABOLIC PANEL WITH GFR  2. Essential hypertension BP stable on present  antihypertensive treatment - triamterene-hydrochlorothiazide (MAXZIDE-25) 37.5-25 MG tablet; Take 1 tablet by mouth daily.  Dispense: 90 tablet; Refill: 0  3. Generalized anxiety disorder  - PARoxetine (PAXIL) 10 MG tablet; Take 1 tablet (10 mg total) by mouth daily.  Dispense: 90 tablet; Refill: 0  4. Diuretic-induced hypokalemia  - potassium chloride SA (K-DUR,KLOR-CON) 20 MEQ tablet; Take 1 tablet (20 mEq total) by mouth daily.  Dispense: 90 tablet; Refill: 0  5. Prediabetes A1c is 6.1% c/w prediabetes. - POCT glycosylated hemoglobin (Hb A1C)   Proctor Carriker [name] 07/30/2017 10:54 AM

## 2017-07-30 NOTE — Patient Instructions (Signed)
[name] , Thank you for taking time to come for your Medicare Wellness Visit. I appreciate your ongoing commitment to your health goals. Please review the following plan we discussed and let me know if I can assist you in the future.   Screening recommendations/referrals: Colonoscopy: Completed 11/22/15. Repeat every 5 years Mammogram: Completed 11/24/16. Repeat every year Bone Density: Completed 07/27/09. No longer required to repeat osteoporotic screenings. Lung: A message has been sent to Burgess Estelle, RN (Oncology Nurse Navigator) regarding the possible need for this exam. [name] will review your chart and review all previous imaging, then will collaborate with radiology to determine if you truly qualify for the exam. If you qualify, [name] will order the Low Dose CT of the chest and will call you to schedule the exam. Hepatitis C Screening: Declined HIV/Syphilis/Hepatitis B Screening: Declined   Vision/Dental Exams: Recommended yearly ophthalmology/optometry visit for glaucoma screening and checkup Recommended yearly dental visit for hygiene and checkup  Vaccinations: Influenza vaccine: Completed 04/06/17 Pneumococcal vaccine: Completed PCV13 05/09/14 and PPSV23 05/08/16 Tdap vaccine: Declined. Please call your insurance company to determine your out of pocket expense. You may also receive this vaccine at your local pharmacy or Health Dept. Shingles vaccine: Completed Zostavax 07/27/12. Please call your insurance company to determine your out of pocket expense for Shingrix. You may also receive this vaccine at your local pharmacy or Health Dept.  Advanced directives: Advance directive discussed with you today. I have provided a copy for you to complete at home and have notarized. Once this is complete please bring a copy in to our office so we can scan it into your chart.  Conditions/risks identified: Recommend to drink at least 6-8 8oz glasses of water per day  Next appointment: You are  scheduled to see Dr. [name] on 07/30/17 @ 11:00am.   Please schedule your Annual Wellness Visit with your Nurse Health Advisor in one year.  Preventive Care 67 Years and Older, Female Preventive care refers to lifestyle choices and visits with your health care provider that can promote health and wellness. What does preventive care include?  A yearly physical exam. This is also called an annual well check.  Dental exams once or twice a year.  Routine eye exams. Ask your health care provider how often you should have your eyes checked.  Personal lifestyle choices, including:  Daily care of your teeth and gums.  Regular physical activity.  Eating a healthy diet.  Avoiding tobacco and drug use.  Limiting alcohol use.  Practicing safe sex.  Taking low-dose aspirin every day.  Taking vitamin and mineral supplements as recommended by your health care provider. What happens during an annual well check? The services and screenings done by your health care provider during your annual well check will depend on your age, overall health, lifestyle risk factors, and family history of disease. Counseling  Your health care provider may ask you questions about your:  Alcohol use.  Tobacco use.  Drug use.  Emotional well-being.  Home and relationship well-being.  Sexual activity.  Eating habits.  History of falls.  Memory and ability to understand (cognition).  Work and work Statistician.  Reproductive health. Screening  You may have the following tests or measurements:  Height, weight, and BMI.  Blood pressure.  Lipid and cholesterol levels. These may be checked every 5 years, or more frequently if you are over 91 years old.  Skin check.  Lung cancer screening. You may have this screening every year starting at  age 4 if you have a 30-pack-year history of smoking and currently smoke or have quit within the past 15 years.  Fecal occult blood test (FOBT) of the stool.  You may have this test every year starting at age 11.  Flexible sigmoidoscopy or colonoscopy. You may have a sigmoidoscopy every 5 years or a colonoscopy every 10 years starting at age 65.  Hepatitis C blood test.  Hepatitis B blood test.  Sexually transmitted disease (STD) testing.  Diabetes screening. This is done by checking your blood sugar (glucose) after you have not eaten for a while (fasting). You may have this done every 1-3 years.  Bone density scan. This is done to screen for osteoporosis. You may have this done starting at age 81.  Mammogram. This may be done every 1-2 years. Talk to your health care provider about how often you should have regular mammograms. Talk with your health care provider about your test results, treatment options, and if necessary, the need for more tests. Vaccines  Your health care provider may recommend certain vaccines, such as:  Influenza vaccine. This is recommended every year.  Tetanus, diphtheria, and acellular pertussis (Tdap, Td) vaccine. You may need a Td booster every 10 years.  Zoster vaccine. You may need this after age 42.  Pneumococcal 13-valent conjugate (PCV13) vaccine. One dose is recommended after age 40.  Pneumococcal polysaccharide (PPSV23) vaccine. One dose is recommended after age 70. Talk to your health care provider about which screenings and vaccines you need and how often you need them. This information is not intended to replace advice given to you by your health care provider. Make sure you discuss any questions you have with your health care provider. Document Released: 08/09/2015 Document Revised: 04/01/2016 Document Reviewed: 05/14/2015 Elsevier Interactive Patient Education  2017 [place] Prevention in the Home Falls can cause injuries. They can happen to people of all ages. There are many things you can do to make your home safe and to help prevent falls. What can I do on the outside of my  home?  Regularly fix the edges of walkways and driveways and fix any cracks.  Remove anything that might make you trip as you walk through a door, such as a raised step or threshold.  Trim any bushes or trees on the path to your home.  Use bright outdoor lighting.  Clear any walking paths of anything that might make someone trip, such as rocks or tools.  Regularly check to see if handrails are loose or broken. Make sure that both sides of any steps have handrails.  Any raised decks and porches should have guardrails on the edges.  Have any leaves, snow, or ice cleared regularly.  Use sand or salt on walking paths during winter.  Clean up any spills in your garage right away. This includes oil or grease spills. What can I do in the bathroom?  Use night lights.  Install grab bars by the toilet and in the tub and shower. Do not use towel bars as grab bars.  Use non-skid mats or decals in the tub or shower.  If you need to sit down in the shower, use a plastic, non-slip stool.  Keep the floor dry. Clean up any water that spills on the floor as soon as it happens.  Remove soap buildup in the tub or shower regularly.  Attach bath mats securely with double-sided non-slip rug tape.  Do not have throw rugs and other things on  the floor that can make you trip. What can I do in the bedroom?  Use night lights.  Make sure that you have a light by your bed that is easy to reach.  Do not use any sheets or blankets that are too big for your bed. They should not hang down onto the floor.  Have a firm chair that has side arms. You can use this for support while you get dressed.  Do not have throw rugs and other things on the floor that can make you trip. What can I do in the kitchen?  Clean up any spills right away.  Avoid walking on wet floors.  Keep items that you use a lot in easy-to-reach places.  If you need to reach something above you, use a strong step stool that has a  grab bar.  Keep electrical cords out of the way.  Do not use floor polish or wax that makes floors slippery. If you must use wax, use non-skid floor wax.  Do not have throw rugs and other things on the floor that can make you trip. What can I do with my stairs?  Do not leave any items on the stairs.  Make sure that there are handrails on both sides of the stairs and use them. Fix handrails that are broken or loose. Make sure that handrails are as long as the stairways.  Check any carpeting to make sure that it is firmly attached to the stairs. Fix any carpet that is loose or worn.  Avoid having throw rugs at the top or bottom of the stairs. If you do have throw rugs, attach them to the floor with carpet tape.  Make sure that you have a light switch at the top of the stairs and the bottom of the stairs. If you do not have them, ask someone to add them for you. What else can I do to help prevent falls?  Wear shoes that:  Do not have high heels.  Have rubber bottoms.  Are comfortable and fit you well.  Are closed at the toe. Do not wear sandals.  If you use a stepladder:  Make sure that it is fully opened. Do not climb a closed stepladder.  Make sure that both sides of the stepladder are locked into place.  Ask someone to hold it for you, if possible.  Clearly mark and make sure that you can see:  Any grab bars or handrails.  First and last steps.  Where the edge of each step is.  Use tools that help you move around (mobility aids) if they are needed. These include:  Canes.  Walkers.  Scooters.  Crutches.  Turn on the lights when you go into a dark area. Replace any light bulbs as soon as they burn out.  Set up your furniture so you have a clear path. Avoid moving your furniture around.  If any of your floors are uneven, fix them.  If there are any pets around you, be aware of where they are.  Review your medicines with your doctor. Some medicines can make  you feel dizzy. This can increase your chance of falling. Ask your doctor what other things that you can do to help prevent falls. This information is not intended to replace advice given to you by your health care provider. Make sure you discuss any questions you have with your health care provider. Document Released: 05/09/2009 Document Revised: 12/19/2015 Document Reviewed: 08/17/2014 Elsevier Interactive Patient Education  2017 Elsevier Inc.   

## 2017-08-02 ENCOUNTER — Telehealth: Payer: Self-pay | Admitting: *Deleted

## 2017-08-02 NOTE — Telephone Encounter (Signed)
Received referral for low dose lung cancer screening CT scan. Message left at phone number listed in EMR for patient to call me back to facilitate scheduling scan.  

## 2017-08-06 ENCOUNTER — Telehealth: Payer: Self-pay

## 2017-08-06 NOTE — Telephone Encounter (Signed)
Copied from [place] [phone]. Topic: General - Other >> Aug 06, 2017 12:18 PM Yvette Rack wrote: Reason for CRM: patient calling to see if someone can call her about elevated calcium

## 2017-08-09 ENCOUNTER — Telehealth: Payer: Self-pay | Admitting: *Deleted

## 2017-08-09 NOTE — Telephone Encounter (Signed)
Spoke to the patient and she couldn't remember what the exact number was for elevated calcium levels. Gave the pt the number readings from 4 months ago and 8 months ago and what it is now. Pt states she wanted to know incase she has to call her oncology doctor. I suggest to call them to give them the numbers just incase. I will fax over labs as well.

## 2017-08-09 NOTE — Telephone Encounter (Signed)
Patient called asking what an elevated calcium means. Asking for a return call [phone]  Notes recorded by Roselee Nova, MD on 08/02/2017 at 5:38 PM EST Fasting lipid panel: Elevated total and LDL cholesterol, normal triglycerides, below normal HDL at 48 mg/dL. Patient is on Livalop 2 mg at bedtime which is ineffective, she should be switched to a higher intensity statin such as atorvastatin or rosuvastatin. Please confirm if patient is able to tolerate those drugs and a prescription will be sent to her pharmacy CMP: Elevated glucose at 125 mg/dL, elevate calcium at 11.1, corrected calcium is still elevated at 10.9 mg/dL. Normal kidney function and liver enzymes. Patient should return for workup of hypercalcemia, she has history of breast cancer  Reading chart, this is a chronic problem since 2016 and perhaps resulting from Diuretics, patient was to discuss with her PCP

## 2017-08-09 NOTE — Telephone Encounter (Signed)
Patient reports that she has contacted her oncologist who believes the elevated calcium is likely from diuretics, reassured that if that's the case, she does not need to come in for workup of hypercalcemia. Patient verbalized agreement

## 2017-08-12 NOTE — Telephone Encounter (Signed)
Phone call to patient and she agrees to contact Dr [name] regarding referral to endocrinology

## 2017-08-12 NOTE — Telephone Encounter (Signed)
Patient will need a referral to endocrinology for evaluation and workup of hypercalcemia

## 2017-08-12 NOTE — Telephone Encounter (Signed)
Patient has chronic hypercalcemia; unlikely malignancy related.  Question diuretics versus parathyroid/other endocrine etiology.  Recommend endocrinology evaluation still having issues with hypercalcemia.   Hassan Rowan- please ask pt to contact Dr.Shah/office re: possible need for endocrine referral. Thx

## 2017-08-13 ENCOUNTER — Telehealth: Payer: Self-pay

## 2017-08-13 NOTE — Telephone Encounter (Signed)
   Left a detail message.      Copied from [place] [phone]. Topic: Referral - Request >> Aug 12, 2017  4:06 PM Ether Griffins B wrote: Reason for CRM: pt contacted her cancer doctor and his nurse said that her calcium levels are nothing new and have been this way for years on 08/09/17.  Then today the office called back and Dr. B recommends her seeing an endocrinologist. The pt is a little worried about this. Pt would like a call to explain the reasoning for her seeing an endocrinologist.

## 2017-08-13 NOTE — Telephone Encounter (Signed)
Reason for endocrinology referral is to determine the exact etiology for hypercalcemia whether it's from diuretics versus parathyroid hormone imbalance etc.

## 2017-10-15 ENCOUNTER — Other Ambulatory Visit: Payer: Self-pay

## 2017-10-15 ENCOUNTER — Telehealth: Payer: Self-pay | Admitting: Family Medicine

## 2017-10-15 DIAGNOSIS — I10 Essential (primary) hypertension: Secondary | ICD-10-CM

## 2017-10-15 MED ORDER — LOSARTAN POTASSIUM 100 MG PO TABS: 100.0000 mg | ORAL_TABLET | Freq: Every day | ORAL | 0 refills | Status: DC

## 2017-10-15 MED ORDER — TELMISARTAN 80 MG PO TABS: 80.0000 mg | ORAL_TABLET | Freq: Every day | ORAL | 0 refills | Status: DC

## 2017-10-15 NOTE — Telephone Encounter (Signed)
Having to swap out ARB; new Rx sent Last Cr and K+ reviewed Pt has an appt on 4/4; will check BP and get labs that day

## 2017-10-15 NOTE — Telephone Encounter (Signed)
Copied from [place]. Topic: Quick Communication - See Telephone Encounter >> Oct 15, 2017 12:05 PM Aurelio Brash B wrote: CRM for notification. See Telephone encounter for: 10/15/17.  Dominica Severin from pharmacy is requesting a rx for the pts losartan (COZAAR) 100 MG tablet  (was on recall list)   [place] (N), [place] - [place] [phone] (Phone) [phone] (Fax)

## 2017-10-28 ENCOUNTER — Ambulatory Visit: Payer: Medicare Other | Admitting: Family Medicine

## 2017-10-28 ENCOUNTER — Encounter: Payer: Self-pay | Admitting: Family Medicine

## 2017-10-28 ENCOUNTER — Other Ambulatory Visit: Payer: Self-pay

## 2017-10-28 DIAGNOSIS — E876 Hypokalemia: Secondary | ICD-10-CM

## 2017-10-28 DIAGNOSIS — E782 Mixed hyperlipidemia: Secondary | ICD-10-CM

## 2017-10-28 DIAGNOSIS — F411 Generalized anxiety disorder: Secondary | ICD-10-CM

## 2017-10-28 DIAGNOSIS — E7800 Pure hypercholesterolemia, unspecified: Secondary | ICD-10-CM | POA: Diagnosis not present

## 2017-10-28 DIAGNOSIS — R7303 Prediabetes: Secondary | ICD-10-CM

## 2017-10-28 DIAGNOSIS — I10 Essential (primary) hypertension: Secondary | ICD-10-CM

## 2017-10-28 DIAGNOSIS — I2510 Atherosclerotic heart disease of native coronary artery without angina pectoris: Secondary | ICD-10-CM | POA: Diagnosis not present

## 2017-10-28 DIAGNOSIS — T502X5A Adverse effect of carbonic-anhydrase inhibitors, benzothiadiazides and other diuretics, initial encounter: Secondary | ICD-10-CM

## 2017-10-28 NOTE — Assessment & Plan Note (Signed)
Check A1c today; limit sweets, simple cards; education provided

## 2017-10-28 NOTE — Patient Instructions (Addendum)
We'll see what the labs show Let's consider different blood pressure medicine (after the labs come back) Keep up the great job with weight loss Try to follow the DASH guidelines (DASH stands for Dietary Approaches to Stop Hypertension). Try to limit the sodium in your diet to no more than 1,500mg  of sodium per day. Certainly try to not exceed 2,000 mg per day at the very most. Do not add salt when cooking or at the table.  Check the sodium amount on labels when shopping, and choose items lower in sodium when given a choice. Avoid or limit foods that already contain a lot of sodium. Eat a diet rich in fruits and vegetables and whole grains, and try to lose weight if overweight or obese Check out the information at familydoctor.org entitled "Nutrition for Weight Loss: What You Need to Know about Fad Diets" Try to lose between 1-2 pounds per week by taking in fewer calories and burning off more calories You can succeed by limiting portions, limiting foods dense in calories and fat, becoming more active, and drinking 8 glasses of water a day (64 ounces) Don't skip meals, especially breakfast, as skipping meals may alter your metabolism Do not use over-the-counter weight loss pills or gimmicks that claim rapid weight loss A healthy BMI (or body mass index) is between 18.5 and 24.9 You can calculate your ideal BMI at the [place] website ClubMonetize.fr  DASH Eating Plan DASH stands for "Dietary Approaches to Stop Hypertension." The DASH eating plan is a healthy eating plan that has been shown to reduce high blood pressure (hypertension). It may also reduce your risk for type 2 diabetes, heart disease, and stroke. The DASH eating plan may also help with weight loss. What are tips for following this plan? General guidelines  Avoid eating more than 2,300 mg (milligrams) of salt (sodium) a day. If you have hypertension, you may need to reduce your sodium intake  to 1,500 mg a day.  Limit alcohol intake to no more than 1 drink a day for nonpregnant women and 2 drinks a day for men. One drink equals 12 oz of beer, 5 oz of wine, or 1 oz of hard liquor.  Work with your health care provider to maintain a healthy body weight or to lose weight. Ask what an ideal weight is for you.  Get at least 30 minutes of exercise that causes your heart to beat faster (aerobic exercise) most days of the week. Activities may include walking, swimming, or biking.  Work with your health care provider or diet and nutrition specialist (dietitian) to adjust your eating plan to your individual calorie needs. Reading food labels  Check food labels for the amount of sodium per serving. Choose foods with less than 5 percent of the Daily Value of sodium. Generally, foods with less than 300 mg of sodium per serving fit into this eating plan.  To find whole grains, look for the word "whole" as the first word in the ingredient list. Shopping  Buy products labeled as "low-sodium" or "no salt added."  Buy fresh foods. Avoid canned foods and premade or frozen meals. Cooking  Avoid adding salt when cooking. Use salt-free seasonings or herbs instead of table salt or sea salt. Check with your health care provider or pharmacist before using salt substitutes.  Do not fry foods. Cook foods using healthy methods such as baking, boiling, grilling, and broiling instead.  Cook with heart-healthy oils, such as olive, canola, soybean, or sunflower oil. Meal planning  Eat a balanced diet that includes: ? 5 or more servings of fruits and vegetables each day. At each meal, try to fill half of your plate with fruits and vegetables. ? Up to 6-8 servings of whole grains each day. ? Less than 6 oz of lean meat, poultry, or fish each day. A 3-oz serving of meat is about the same size as a deck of cards. One egg equals 1 oz. ? 2 servings of low-fat dairy each day. ? A serving of nuts, seeds, or  beans 5 times each week. ? Heart-healthy fats. Healthy fats called Omega-3 fatty acids are found in foods such as flaxseeds and coldwater fish, like sardines, salmon, and mackerel.  Limit how much you eat of the following: ? Canned or prepackaged foods. ? Food that is high in trans fat, such as fried foods. ? Food that is high in saturated fat, such as fatty meat. ? Sweets, desserts, sugary drinks, and other foods with added sugar. ? Full-fat dairy products.  Do not salt foods before eating.  Try to eat at least 2 vegetarian meals each week.  Eat more home-cooked food and less restaurant, buffet, and fast food.  When eating at a restaurant, ask that your food be prepared with less salt or no salt, if possible. What foods are recommended? The items listed may not be a complete list. Talk with your dietitian about what dietary choices are best for you. Grains Whole-grain or whole-wheat bread. Whole-grain or whole-wheat pasta. Brown rice. [place]. Bulgur. Whole-grain and low-sodium cereals. Pita bread. Low-fat, low-sodium crackers. Whole-wheat flour tortillas. Vegetables Fresh or frozen vegetables (raw, steamed, roasted, or grilled). Low-sodium or reduced-sodium tomato and vegetable juice. Low-sodium or reduced-sodium tomato sauce and tomato paste. Low-sodium or reduced-sodium canned vegetables. Fruits All fresh, dried, or frozen fruit. Canned fruit in natural juice (without added sugar). Meat and other protein foods Skinless chicken or [place]. Ground chicken or [place]. Pork with fat trimmed off. Fish and seafood. Egg whites. Dried beans, peas, or lentils. Unsalted nuts, nut butters, and seeds. Unsalted canned beans. Lean cuts of beef with fat trimmed off. Low-sodium, lean deli meat. Dairy Low-fat (1%) or fat-free (skim) milk. Fat-free, low-fat, or reduced-fat cheeses. Nonfat, low-sodium ricotta or cottage cheese. Low-fat or nonfat yogurt. Low-fat, low-sodium cheese. Fats and  oils Soft margarine without trans fats. Vegetable oil. Low-fat, reduced-fat, or light mayonnaise and salad dressings (reduced-sodium). Canola, safflower, olive, soybean, and sunflower oils. Avocado. Seasoning and other foods Herbs. Spices. Seasoning mixes without salt. Unsalted popcorn and pretzels. Fat-free sweets. What foods are not recommended? The items listed may not be a complete list. Talk with your dietitian about what dietary choices are best for you. Grains Baked goods made with fat, such as croissants, muffins, or some breads. Dry pasta or rice meal packs. Vegetables Creamed or fried vegetables. Vegetables in a cheese sauce. Regular canned vegetables (not low-sodium or reduced-sodium). Regular canned tomato sauce and paste (not low-sodium or reduced-sodium). Regular tomato and vegetable juice (not low-sodium or reduced-sodium). Angie Fava. Olives. Fruits Canned fruit in a light or heavy syrup. Fried fruit. Fruit in cream or butter sauce. Meat and other protein foods Fatty cuts of meat. Ribs. Fried meat. Berniece Salines. Sausage. Bologna and other processed lunch meats. Salami. Fatback. Hotdogs. Bratwurst. Salted nuts and seeds. Canned beans with added salt. Canned or smoked fish. Whole eggs or egg yolks. Chicken or [place] with skin. Dairy Whole or 2% milk, cream, and half-and-half. Whole or full-fat cream cheese. Whole-fat or sweetened yogurt.  Full-fat cheese. Nondairy creamers. Whipped toppings. Processed cheese and cheese spreads. Fats and oils Butter. Stick margarine. Lard. Shortening. Ghee. Bacon fat. Tropical oils, such as coconut, palm kernel, or palm oil. Seasoning and other foods Salted popcorn and pretzels. Onion salt, garlic salt, seasoned salt, table salt, and sea salt. Worcestershire sauce. Tartar sauce. Barbecue sauce. Teriyaki sauce. Soy sauce, including reduced-sodium. Steak sauce. Canned and packaged gravies. Fish sauce. Oyster sauce. Cocktail sauce. Horseradish that you find on the  shelf. Ketchup. Mustard. Meat flavorings and tenderizers. Bouillon cubes. Hot sauce and Tabasco sauce. Premade or packaged marinades. Premade or packaged taco seasonings. Relishes. Regular salad dressings. Where to find more information:  National Heart, Lung, and [place]: https://wilson-eaton.com/  American Heart Association: www.heart.org Summary  The DASH eating plan is a healthy eating plan that has been shown to reduce high blood pressure (hypertension). It may also reduce your risk for type 2 diabetes, heart disease, and stroke.  With the DASH eating plan, you should limit salt (sodium) intake to 2,300 mg a day. If you have hypertension, you may need to reduce your sodium intake to 1,500 mg a day.  When on the DASH eating plan, aim to eat more fresh fruits and vegetables, whole grains, lean proteins, low-fat dairy, and heart-healthy fats.  Work with your health care provider or diet and nutrition specialist (dietitian) to adjust your eating plan to your individual calorie needs. This information is not intended to replace advice given to you by your health care provider. Make sure you discuss any questions you have with your health care provider. Document Released: 07/02/2011 Document Revised: 07/06/2016 Document Reviewed: 07/06/2016 Elsevier Interactive Patient Education  [employer].

## 2017-10-28 NOTE — Assessment & Plan Note (Signed)
On paroxetine

## 2017-10-28 NOTE — Assessment & Plan Note (Signed)
I'm inclined to see what her labs show and then stop the thiazide and the potassium as well; will consider, read more of her chart

## 2017-10-28 NOTE — Assessment & Plan Note (Signed)
On aspirin; goal LDL less than 70

## 2017-10-28 NOTE — Assessment & Plan Note (Addendum)
Not sure why patient hasn't been taken off of the diuretic; will see what the labs show and then may try stopping the thiazide and potassium and use another agent

## 2017-10-28 NOTE — Assessment & Plan Note (Signed)
Patient is on thiazide diuretic; I'm going to check phos, vit D, iPTH

## 2017-10-28 NOTE — Progress Notes (Signed)
BP 132/78 (BP Location: Left Arm, Patient Position: Sitting, Cuff Size: Normal)   Pulse 79   Temp 98.2 F (36.8 C) (Oral)   Ht 5\' 4"  (1.626 m)   Wt 174 lb 8 oz (79.2 kg)   SpO2 98%   BMI 29.95 kg/m    Subjective:    Patient ID: Theresa Andrews, female    DOB: [birth year], 72 y.o.   MRN: [id number]  HPI: Theresa Andrews is a 72 y.o. female  Chief Complaint  Patient presents with  . Follow-up   HPI Patient is here for f/u Hypertension; going on since 1976; runs in the family; on thiazide diuretic, triamterene, ARB, potassium; last four potassium levels have been 3.5 to 4 Coronary artery disease, hx of MI in 2008; no damage; minor, got the stent and quit smoking (cold [place]) High cholesterol; last LDL was 138; had been on pravastatin, but that caused her bones to ache; she got a treadmill; he got a call about livalo; not taking anything at all for cholesterol; she wants to see how she's doing with her diet and exercise alone anxiety; on paxil; certain people are the cause Hx of breast cancer; seeing oncologist, 2011; goes there once a year; she found it herself; she was laying in bed one night and felt the lump, stage 2 Prediabetes; last A1c was 6.1; improving with diet and exercise High calcium; has been on diuretics since the 1970's  Depression screen [facility] 2/9 10/28/2017 07/30/2017 04/06/2017 01/04/2017 12/10/2016  Decreased Interest 0 0 0 0 0  Down, Depressed, Hopeless 0 0 0 0 0  PHQ - 2 Score 0 0 0 0 0    Relevant past medical, surgical, family and social history reviewed Past Medical History:  Diagnosis Date  . Anxiety   . Breast cancer ([place])    pT2 pN0 cM0 (stage IIA) invasive mammary carcinoma of left outer breast status post lumpectomy and sentinel node study on July 14, 2010  . CAD (coronary artery disease)   . Depression   . H/O hypokalemia   . History of chemotherapy   . History of MI (myocardial infarction)   . Hyperglycemia   . Hyperlipidemia   . Hypertension     . Peripheral neuropathy due to chemotherapy [facility])    Past Surgical History:  Procedure Laterality Date  . ABDOMINAL HYSTERECTOMY    . BREAST BIOPSY Left 2008   neg  . BREAST EXCISIONAL BIOPSY Left 2011   +  . CHOLECYSTECTOMY    . COLONOSCOPY WITH PROPOFOL N/A 11/22/2015   Procedure: COLONOSCOPY WITH PROPOFOL;  Surgeon: Hulen Luster, MD;  Location: [facility] ENDOSCOPY;  Service: Gastroenterology;  Laterality: N/A;  . CORONARY ANGIOPLASTY WITH STENT PLACEMENT  2008   Family History  Problem Relation Age of Onset  . Breast cancer Sister   . Hypertension Mother   . Aneurysm Mother   . Heart attack Sister   . Birth defects Sister    Social History   Tobacco Use  . Smoking status: Former Smoker    Packs/day: 0.25    Years: 25.00    Pack years: 6.25    Types: Cigarettes    Last attempt to quit: 2008    Years since quitting: 11.2  . Smokeless tobacco: Never Used  . Tobacco comment: smoking cessation materials not required  Substance Use Topics  . Alcohol use: No    Comment: rare; holidays  . Drug use: No    Interim medical history since last visit  reviewed. Allergies and medications reviewed  Review of Systems Per HPI unless specifically indicated above     Objective:    BP 132/78 (BP Location: Left Arm, Patient Position: Sitting, Cuff Size: Normal)   Pulse 79   Temp 98.2 F (36.8 C) (Oral)   Ht 5\' 4"  (1.626 m)   Wt 174 lb 8 oz (79.2 kg)   SpO2 98%   BMI 29.95 kg/m   Wt Readings from Last 3 Encounters:  10/28/17 174 lb 8 oz (79.2 kg)  07/30/17 179 lb (81.2 kg)  07/30/17 179 lb 12.8 oz (81.6 kg)    Physical Exam  Constitutional: She appears well-developed and well-nourished. No distress.  HENT:  Head: Normocephalic and atraumatic.  Eyes: EOM are normal. No scleral icterus.  Neck: No thyromegaly present.  Cardiovascular: Normal rate and regular rhythm.  No murmur heard. Pulmonary/Chest: Effort normal and breath sounds normal.  Abdominal: Soft. Bowel sounds are  normal. She exhibits no distension.  Musculoskeletal: She exhibits no edema.  Neurological: She is alert.  Skin: Skin is warm. She is not diaphoretic. No pallor.  Psychiatric: She has a normal mood and affect.    Results for orders placed or performed in visit on 07/30/17  Lipid panel  Result Value Ref Range   Cholesterol 209 (H) <200 mg/dL   HDL 48 (L) >50 mg/dL   Triglycerides 112 <150 mg/dL   LDL Cholesterol (Calc) 138 (H) mg/dL (calc)   Total CHOL/HDL Ratio 4.4 <5.0 (calc)   Non-HDL Cholesterol (Calc) 161 (H) <130 mg/dL (calc)  COMPLETE METABOLIC PANEL WITH GFR  Result Value Ref Range   Glucose, Bld 125 (H) 65 - 99 mg/dL   BUN 11 7 - 25 mg/dL   Creat 0.78 0.60 - 0.93 mg/dL   GFR, Est Non African American 77 > OR = 60 mL/min/1.2m2   GFR, Est African American 89 > OR = 60 mL/min/1.14m2   BUN/Creatinine Ratio NOT APPLICABLE 6 - 22 (calc)   Sodium 139 135 - 146 mmol/L   Potassium 3.9 3.5 - 5.3 mmol/L   Chloride 101 98 - 110 mmol/L   CO2 30 20 - 32 mmol/L   Calcium 11.1 (H) 8.6 - 10.4 mg/dL   Total Protein 7.1 6.1 - 8.1 g/dL   Albumin 4.3 3.6 - 5.1 g/dL   Globulin 2.8 1.9 - 3.7 g/dL (calc)   AG Ratio 1.5 1.0 - 2.5 (calc)   Total Bilirubin 0.4 0.2 - 1.2 mg/dL   Alkaline phosphatase (APISO) 85 33 - 130 U/L   AST 11 10 - 35 U/L   ALT 8 6 - 29 U/L  POCT glycosylated hemoglobin (Hb A1C)  Result Value Ref Range   Hemoglobin A1C 6.1       Assessment & Plan:   Problem List Items Addressed This Visit      Cardiovascular and Mediastinum   Benign essential HTN    Not sure why patient hasn't been taken off of the diuretic; will see what the labs show and then may try stopping the thiazide and potassium and use another agent      Atherosclerosis of coronary artery    On aspirin; goal LDL less than 70        Other   Hyperlipidemia   Relevant Orders   Lipid panel   Lipid panel   Prediabetes    Check A1c today; limit sweets, simple cards; education provided       Relevant Orders   Lipid panel   Hemoglobin A1c  Hypercalcemia - Primary    Patient is on thiazide diuretic; I'm going to check phos, vit D, iPTH      Relevant Orders   VITAMIN D 25 Hydroxy (Vit-D Deficiency, Fractures)   Phosphorus   Comprehensive Metabolic Panel (CMET)   Parathyroid hormone, intact (no Ca)   Diuretic-induced hypokalemia    I'm inclined to see what her labs show and then stop the thiazide and the potassium as well; will consider, read more of her chart      Anxiety disorder    On paroxetine          Follow up plan: Return in about 6 months (around 04/29/2018) for follow-up visit with Dr. Sanda Klein; Medicare Wellness with Ammie when due.  An after-visit summary was printed and given to the patient at [place].  Please see the patient instructions which may contain other information and recommendations beyond what is mentioned above in the assessment and plan.  No orders of the defined types were placed in this encounter.   Orders Placed This Encounter  Procedures  . VITAMIN D 25 Hydroxy (Vit-D Deficiency, Fractures)  . Phosphorus  . Comprehensive Metabolic Panel (CMET)  . Lipid panel  . Hemoglobin A1c  . Parathyroid hormone, intact (no Ca)

## 2017-10-29 ENCOUNTER — Other Ambulatory Visit: Payer: Self-pay | Admitting: Family Medicine

## 2017-10-29 DIAGNOSIS — E559 Vitamin D deficiency, unspecified: Secondary | ICD-10-CM

## 2017-10-29 DIAGNOSIS — E213 Hyperparathyroidism, unspecified: Secondary | ICD-10-CM

## 2017-10-29 LAB — COMPREHENSIVE METABOLIC PANEL
AG RATIO: 1.4 (calc) (ref 1.0–2.5)
ALT: 7 U/L (ref 6–29)
AST: 12 U/L (ref 10–35)
Albumin: 4.3 g/dL (ref 3.6–5.1)
Alkaline phosphatase (APISO): 89 U/L (ref 33–130)
BUN: 12 mg/dL (ref 7–25)
CHLORIDE: 102 mmol/L (ref 98–110)
CO2: 30 mmol/L (ref 20–32)
CREATININE: 0.71 mg/dL (ref 0.60–0.93)
Calcium: 10.9 mg/dL — ABNORMAL HIGH (ref 8.6–10.4)
GLOBULIN: 3 g/dL (ref 1.9–3.7)
GLUCOSE: 102 mg/dL — AB (ref 65–99)
Potassium: 4.5 mmol/L (ref 3.5–5.3)
SODIUM: 138 mmol/L (ref 135–146)
TOTAL PROTEIN: 7.3 g/dL (ref 6.1–8.1)
Total Bilirubin: 0.6 mg/dL (ref 0.2–1.2)

## 2017-10-29 LAB — LIPID PANEL
CHOL/HDL RATIO: 3.9 (calc) (ref <5.0)
Cholesterol: 199 mg/dL (ref <200)
HDL: 51 mg/dL (ref >50)
LDL Cholesterol (Calc): 132 mg/dL (calc) — ABNORMAL HIGH
Non-HDL Cholesterol (Calc): 148 mg/dL (calc) — ABNORMAL HIGH (ref <130)
TRIGLYCERIDES: 69 mg/dL (ref <150)

## 2017-10-29 LAB — HEMOGLOBIN A1C
EAG (MMOL/L): 7 (calc)
Hgb A1c MFr Bld: 6 % of total Hgb — ABNORMAL HIGH (ref <5.7)
Mean Plasma Glucose: 126 (calc)

## 2017-10-29 LAB — PHOSPHORUS: PHOSPHORUS: 3.1 mg/dL (ref 2.1–4.3)

## 2017-10-29 LAB — PARATHYROID HORMONE, INTACT (NO CA): PTH: 142 pg/mL — AB (ref 14–64)

## 2017-10-29 LAB — VITAMIN D 25 HYDROXY (VIT D DEFICIENCY, FRACTURES): Vit D, 25-Hydroxy: 7 ng/mL — ABNORMAL LOW (ref 30–100)

## 2017-10-29 MED ORDER — METOPROLOL SUCCINATE ER 25 MG PO TB24: 25.0000 mg | ORAL_TABLET | Freq: Every day | ORAL | 2 refills | Status: DC

## 2017-10-29 MED ORDER — VITAMIN D (ERGOCALCIFEROL) 1.25 MG (50000 UNIT) PO CAPS: 50000.0000 [IU] | ORAL_CAPSULE | ORAL | 1 refills | Status: AC

## 2017-10-29 NOTE — Progress Notes (Signed)
PTH really high; Ca2+ still high Refer to endocrinologist; not sure if primary or tertiary, will let them make decision about further work-up I do want to STOP the thiazide, though Stop maxzide; stop potassium; continue ARB and CCB Add beta-blocker Return on Friday April 12th for BP check, pulse check, and BMP She'll come by in the morning on Friday around 9 am or 10 am at the latest Start vitamin D A1c improved Lipids improved some on her own; she quit the statin because of aching Never got prescription filled

## 2017-11-02 ENCOUNTER — Other Ambulatory Visit: Payer: Self-pay

## 2017-11-02 DIAGNOSIS — I10 Essential (primary) hypertension: Secondary | ICD-10-CM

## 2017-11-02 MED ORDER — AMLODIPINE BESYLATE 10 MG PO TABS: 10.0000 mg | ORAL_TABLET | Freq: Every day | ORAL | 3 refills | Status: DC

## 2017-11-02 NOTE — Telephone Encounter (Signed)
90 day supply

## 2017-11-03 ENCOUNTER — Encounter: Payer: Self-pay | Admitting: Family Medicine

## 2017-11-05 ENCOUNTER — Ambulatory Visit: Payer: Medicare Other

## 2017-11-05 ENCOUNTER — Other Ambulatory Visit: Payer: Self-pay | Admitting: Family Medicine

## 2017-11-05 LAB — BASIC METABOLIC PANEL
BUN: 14 mg/dL (ref 7–25)
CHLORIDE: 106 mmol/L (ref 98–110)
CO2: 29 mmol/L (ref 20–32)
CREATININE: 0.71 mg/dL (ref 0.60–0.93)
Calcium: 10.3 mg/dL (ref 8.6–10.4)
Glucose, Bld: 108 mg/dL — ABNORMAL HIGH (ref 65–99)
Potassium: 3.5 mmol/L (ref 3.5–5.3)
Sodium: 141 mmol/L (ref 135–146)

## 2017-11-05 MED ORDER — HYDROCHLOROTHIAZIDE 12.5 MG PO CAPS: 12.5000 mg | ORAL_CAPSULE | Freq: Every day | ORAL | 2 refills | Status: DC

## 2017-11-05 NOTE — Progress Notes (Signed)
Patient unhappy with the amount of swelling Start back on just 12.5 mg HCTZ since it does not look like the Ca2+ elevation is due to the thiazide, but rather to hyperparathyroidism Return in 7-10 days for BP recheck and pulse recheck Decrease beta-blocker to 12.5 mg daily too

## 2017-11-08 ENCOUNTER — Telehealth: Payer: Self-pay

## 2017-11-08 ENCOUNTER — Ambulatory Visit: Payer: Self-pay | Admitting: *Deleted

## 2017-11-08 NOTE — Telephone Encounter (Signed)
-----   Message from Arnetha Courser, MD sent at 11/05/2017  5:04 PM EDT ----- Please let the patient know that her calcium level had actually returned to normal off of the HCTZ; it dropped from 10.9 to 10.3; I know we just started her back on the 12.5 mg daily, so we'll see what happens with this; thank you

## 2017-11-08 NOTE — Telephone Encounter (Signed)
Already addressed in another note with [facility]

## 2017-11-08 NOTE — Telephone Encounter (Signed)
Pt reports B/P yesterday am 173/98 and 166/98 PRIOR to taking medications. B/P after medications 135/86.   Pt seen 4/4 by Dr. Sanda Klein;   Maxide-25  d/c'ed.  Pt in for B/P recheck 11/05/17- 144/80 at [facility].   Pt placed on HTCZ 12.48m which she started Saturday 11/06/17. Pt has MULTIPLE readings of B/P throughout the day yesterday, ranging from 142/87 to 125/75 at HS.  HRR at 54-65. B/P this am 147/70. Pt also reports mild edema of bilateral ankles. States is "much better but thought it should be gone by now."  Pt questioning if dosage of HCTZ is "enough." states "very concerned about swelling." She reiterates it is bilateral and "mild."   Pt denies any other symptoms; no SOB, CP, headache.  Pt requesting call back from practice re: dosage. Care advise given.    [phone] Reason for Disposition . Caller has NON-URGENT medication question about med that PCP prescribed and triager unable to answer question  Answer Assessment - Initial Assessment Questions 1. SYMPTOMS: "Do you have any symptoms?"     B/P yesterday am 173/98 and 166/98 PRIOR to taking B/P medications. 135/86 after meds.  This am 147/70 2. SEVERITY: If symptoms are present, ask "Are they mild, moderate or severe?"     Mild edema ankles.  Protocols used: MEDICATION QUESTION CALL-A-AH

## 2017-11-08 NOTE — Telephone Encounter (Signed)
Called pt informed her of the information below. Pt gave verbal understanding. Also advised pt on triage note, advised pt to adhere to DASH diet, relax, medicate, and avoid NSAIDS. Advised pt to call back if BP continues to be over 140. Pt already scheduled for BP check Friday

## 2017-11-08 NOTE — Addendum Note (Signed)
Addended by: Quiera Diffee, Satira Anis on: 11/08/2017 05:38 PM   Modules accepted: Orders

## 2017-11-12 ENCOUNTER — Ambulatory Visit: Payer: Medicare Other

## 2017-11-15 ENCOUNTER — Ambulatory Visit: Payer: Medicare Other

## 2017-11-15 VITALS — BP 118/76 | HR 62

## 2017-11-15 DIAGNOSIS — I10 Essential (primary) hypertension: Secondary | ICD-10-CM

## 2017-11-17 ENCOUNTER — Other Ambulatory Visit: Payer: Self-pay | Admitting: Family Medicine

## 2017-11-25 ENCOUNTER — Ambulatory Visit
Admission: RE | Admit: 2017-11-25 | Discharge: 2017-11-25 | Disposition: A | Discharge status: Home / Self Care | Payer: Medicare Other | Source: Ambulatory Visit | Attending: Internal Medicine | Admitting: Internal Medicine

## 2017-11-25 DIAGNOSIS — Z853 Personal history of malignant neoplasm of breast: Secondary | ICD-10-CM | POA: Insufficient documentation

## 2017-11-25 DIAGNOSIS — C50812 Malignant neoplasm of overlapping sites of left female breast: Secondary | ICD-10-CM

## 2017-11-25 DIAGNOSIS — Z170 Estrogen receptor positive status [ER+]: Secondary | ICD-10-CM

## 2017-11-25 HISTORY — DX: Personal history of antineoplastic chemotherapy: Z92.21

## 2017-11-25 HISTORY — DX: Personal history of irradiation: Z92.3

## 2017-11-29 ENCOUNTER — Inpatient Hospital Stay (HOSPITAL_BASED_OUTPATIENT_CLINIC_OR_DEPARTMENT_OTHER): Payer: Medicare Other | Admitting: Internal Medicine

## 2017-11-29 ENCOUNTER — Other Ambulatory Visit: Payer: Self-pay

## 2017-11-29 ENCOUNTER — Inpatient Hospital Stay: Payer: Medicare Other | Attending: Internal Medicine

## 2017-11-29 VITALS — BP 134/82 | HR 54 | Temp 97.2°F | Resp 16 | Wt 173.2 lb

## 2017-11-29 DIAGNOSIS — Z853 Personal history of malignant neoplasm of breast: Secondary | ICD-10-CM | POA: Diagnosis not present

## 2017-11-29 DIAGNOSIS — G620 Drug-induced polyneuropathy: Secondary | ICD-10-CM

## 2017-11-29 DIAGNOSIS — C50812 Malignant neoplasm of overlapping sites of left female breast: Secondary | ICD-10-CM

## 2017-11-29 DIAGNOSIS — T451X5S Adverse effect of antineoplastic and immunosuppressive drugs, sequela: Secondary | ICD-10-CM | POA: Insufficient documentation

## 2017-11-29 DIAGNOSIS — Z87891 Personal history of nicotine dependence: Secondary | ICD-10-CM | POA: Diagnosis not present

## 2017-11-29 DIAGNOSIS — Z9221 Personal history of antineoplastic chemotherapy: Secondary | ICD-10-CM

## 2017-11-29 DIAGNOSIS — I10 Essential (primary) hypertension: Secondary | ICD-10-CM | POA: Diagnosis not present

## 2017-11-29 DIAGNOSIS — Z170 Estrogen receptor positive status [ER+]: Principal | ICD-10-CM

## 2017-11-29 LAB — CBC WITH DIFFERENTIAL/PLATELET
BASOS ABS: 0 10*3/uL (ref 0–0.1)
Basophils Relative: 1 %
Eosinophils Absolute: 0.2 10*3/uL (ref 0–0.7)
Eosinophils Relative: 4 %
HEMATOCRIT: 43.6 % (ref 35.0–47.0)
Hemoglobin: 14.6 g/dL (ref 12.0–16.0)
LYMPHS PCT: 37 %
Lymphs Abs: 1.8 10*3/uL (ref 1.0–3.6)
MCH: 30.9 pg (ref 26.0–34.0)
MCHC: 33.6 g/dL (ref 32.0–36.0)
MCV: 92 fL (ref 80.0–100.0)
MONO ABS: 0.4 10*3/uL (ref 0.2–0.9)
Monocytes Relative: 7 %
NEUTROS ABS: 2.6 10*3/uL (ref 1.4–6.5)
Neutrophils Relative %: 51 %
Platelets: 254 10*3/uL (ref 150–440)
RBC: 4.74 MIL/uL (ref 3.80–5.20)
RDW: 14.3 % (ref 11.5–14.5)
WBC: 5 10*3/uL (ref 3.6–11.0)

## 2017-11-29 LAB — COMPREHENSIVE METABOLIC PANEL
ALBUMIN: 4.1 g/dL (ref 3.5–5.0)
ALT: 11 U/L — ABNORMAL LOW (ref 14–54)
ANION GAP: 9 (ref 5–15)
AST: 16 U/L (ref 15–41)
Alkaline Phosphatase: 85 U/L (ref 38–126)
BILIRUBIN TOTAL: 0.9 mg/dL (ref 0.3–1.2)
BUN: 13 mg/dL (ref 6–20)
CHLORIDE: 101 mmol/L (ref 101–111)
CO2: 26 mmol/L (ref 22–32)
Calcium: 10.3 mg/dL (ref 8.9–10.3)
Creatinine, Ser: 0.81 mg/dL (ref 0.44–1.00)
GFR calc Af Amer: >60 mL/min (ref >60)
GFR calc non Af Amer: >60 mL/min (ref >60)
GLUCOSE: 124 mg/dL — AB (ref 65–99)
POTASSIUM: 3.4 mmol/L — AB (ref 3.5–5.1)
SODIUM: 136 mmol/L (ref 135–145)
TOTAL PROTEIN: 7.7 g/dL (ref 6.5–8.1)

## 2017-11-29 NOTE — Assessment & Plan Note (Addendum)
#  Left breast cancer stage II ER PR positive HER-2 negative; finished 5 years in May 2017.  #Clinically no evidence of recurrence.  May 2019 mammogram within normal limits.  # Hypercalcemia- chronic [since 2016]; primary PTH; off HCTZ; awaiting endocrine evaluation. Today calcium is 10.3 within normal limits.  #Weight loss-intentional of 30 pounds.  Congratulated the patient on pursuing healthy choices.    # PN- G-1 on essential oils.   # follow up in 12 months/mammo screening 2020.   # cc; Dr.Lada.

## 2017-11-29 NOTE — Progress Notes (Signed)
[place] OFFICE PROGRESS NOTE  Patient Care Team: Lada, Satira Anis, MD as PCP - General (Family Medicine) Cammie Sickle, MD as Consulting Physician (Internal Medicine) Corey Skains, MD as Consulting Physician (Cardiology)  Cancer Staging No matching staging information was found for the patient.   Oncology History   # AUG 2011- LEFT BREAST CA Stage II ER/PR- Pos; her 2 NEG; s/p neo-adj ACx4- poor response; s/p lumpec [Dr.Arru]- ypT2 [2.5cm]ypsN-0/2; Taxol weekly 9/12 [sec to PN]; finished march 2012. Arimidex-May 2012; Aug 2013- changed to Aug 2013; sep 2013- Started Tam; Mammo- in April 2017.   # Declines extended anti-hormone therapy.   # Feb 2017- Bone scan- Negative [hypercalcemia] ; mammogram May 2018-wnl.   # Hypercalcemia- chronic [2016]; primary hyperparathyrdoisim; improved after stopping HCTZ.   # PN- G-1     Breast CA ([place])   10/29/2014 Initial Diagnosis    Breast CA ([facility])       Carcinoma of overlapping sites of left breast in female, estrogen receptor positive ([place])      INTERVAL HISTORY:  [name] 72 y.o.  female pleasant patient above history of ER PR positive stage II breast cancer is here for follow-up.   In the interim patient was well with PCP regarding elevated calcium/PTH levels elevated.  She is awaiting evaluation with endocrinology.  She is currently off thiazide diuretic.  She denies any constipation.  Denies any sluggishness.  Patient had intentional weight loss of about 30 pounds in the last 1 year.  She has been eating well.  Exercising.  She feels energetic.  No fatigue.  No lumps or bumps.  No nausea no vomiting.  Review of Systems  Constitutional: Negative for chills, diaphoresis, fever, malaise/fatigue and weight loss.  HENT: Negative for nosebleeds and sore throat.   Eyes: Negative for double vision.  Respiratory: Negative for cough, hemoptysis, sputum production, shortness of breath and wheezing.    Cardiovascular: Negative for chest pain, palpitations, orthopnea and leg swelling.  Gastrointestinal: Negative for abdominal pain, blood in stool, constipation, diarrhea, heartburn, melena, nausea and vomiting.  Genitourinary: Negative for dysuria, frequency and urgency.  Musculoskeletal: Negative for back pain and joint pain.  Skin: Negative.  Negative for itching and rash.  Neurological: Negative for dizziness, tingling, focal weakness, weakness and headaches.  Endo/Heme/Allergies: Does not bruise/bleed easily.  Psychiatric/Behavioral: Negative for depression. The patient is not nervous/anxious and does not have insomnia.       PAST MEDICAL HISTORY :  Past Medical History:  Diagnosis Date  . Anxiety   . Breast cancer ([place])    pT2 pN0 cM0 (stage IIA) invasive mammary carcinoma of left outer breast status post lumpectomy and sentinel node study on July 14, 2010  . CAD (coronary artery disease)   . Depression   . H/O hypokalemia   . History of chemotherapy   . History of MI (myocardial infarction)   . Hyperglycemia   . Hyperlipidemia   . Hypertension   . Peripheral neuropathy due to chemotherapy ([place])   . Personal history of chemotherapy 2011   left breast ca  . Personal history of radiation therapy 2001   left breast ca    PAST SURGICAL HISTORY :   Past Surgical History:  Procedure Laterality Date  . ABDOMINAL HYSTERECTOMY    . BREAST BIOPSY Left 2008   neg  . BREAST BIOPSY Left 03/05/2010   invasive mammary carcinoma  . BREAST LUMPECTOMY Left 07/14/2010   INVASIVE MAMMARY CARCINOMA WITH PROMINENT  INFILTRATIVE PATTERN, negative margins  . CHOLECYSTECTOMY    . COLONOSCOPY WITH PROPOFOL N/A 11/22/2015   Procedure: COLONOSCOPY WITH PROPOFOL;  Surgeon: Hulen Luster, MD;  Location: [facility] ENDOSCOPY;  Service: Gastroenterology;  Laterality: N/A;  . CORONARY ANGIOPLASTY WITH STENT PLACEMENT  2008    FAMILY HISTORY :   Family History  Problem Relation Age of Onset  .  Breast cancer Sister 17  . Hypertension Mother   . Aneurysm Mother   . Heart attack Sister   . Birth defects Sister     SOCIAL HISTORY:   Social History   Tobacco Use  . Smoking status: Former Smoker    Packs/day: 0.25    Years: 25.00    Pack years: 6.25    Types: Cigarettes    Last attempt to quit: 2008    Years since quitting: 11.3  . Smokeless tobacco: Never Used  . Tobacco comment: smoking cessation materials not required  Substance Use Topics  . Alcohol use: No    Comment: rare; holidays  . Drug use: No    ALLERGIES:  is allergic to tetanus toxoids and shellfish allergy.  MEDICATIONS:  Current Outpatient Medications  Medication Sig Dispense Refill  . amLODipine (NORVASC) 10 MG tablet Take 1 tablet (10 mg total) by mouth daily. 90 tablet 3  . aspirin 81 MG EC tablet Take 81 mg by mouth daily.    . clindamycin-benzoyl peroxide (BENZACLIN) gel Apply 1 application topically 2 (two) times daily.   0  . hydrochlorothiazide (MICROZIDE) 12.5 MG capsule Take 1 capsule (12.5 mg total) by mouth daily. 30 capsule 2  . metoprolol succinate (TOPROL-XL) 25 MG 24 hr tablet Take 0.5 tablets (12.5 mg total) by mouth daily.    Marland Kitchen PARoxetine (PAXIL) 10 MG tablet Take 1 tablet (10 mg total) by mouth daily. 90 tablet 0  . telmisartan (MICARDIS) 80 MG tablet TAKE 1 TABLET BY MOUTH ONCE DAILY FOR BLOOD PRESSURE *THIS REPLACES LOSARTAN* 90 tablet 1  . tretinoin (RETIN-A) 0.025 % cream Apply topically at bedtime.   4  . Vitamin D, Ergocalciferol, (DRISDOL) 50000 units CAPS capsule Take 1 capsule (50,000 Units total) by mouth every 7 (seven) days. 4 capsule 1   No current facility-administered medications for this visit.     PHYSICAL EXAMINATION: ECOG PERFORMANCE STATUS: 0 - Asymptomatic  BP 134/82 (BP Location: Left Arm, Patient Position: Sitting)   Pulse (!) 54   Temp (!) 97.2 F (36.2 C)   Resp 16   Wt 173 lb 3.2 oz (78.6 kg)   BMI 29.73 kg/m   Filed Weights   11/29/17 1402   Weight: 173 lb 3.2 oz (78.6 kg)    GENERAL: Well-nourished well-developed; Alert, no distress and comfortable. EYES: no pallor or icterus OROPHARYNX: no thrush or ulceration; NECK: supple; no lymph nodes felt. LYMPH:  no palpable lymphadenopathy in the axillary or inguinal regions LUNGS: Decreased breath sounds auscultation bilaterally. No wheeze or crackles HEART/CVS: regular rate & rhythm and no murmurs; No lower extremity edema ABDOMEN:abdomen soft, non-tender and normal bowel sounds. No hepatomegaly or splenomegaly.  Musculoskeletal:no cyanosis of digits and no clubbing  PSYCH: alert & oriented x 3 with fluent speech NEURO: no focal motor/sensory deficits SKIN:  no rashes or significant lesions  LABORATORY DATA:  I have reviewed the data as listed    Component Value Date/Time   NA 136 11/29/2017 1339   NA 141 08/20/2015 1143   K 3.4 (L) 11/29/2017 1339   CL 101 11/29/2017 1339  CO2 26 11/29/2017 1339   GLUCOSE 124 (H) 11/29/2017 1339   GLUCOSE 134 (H) 03/01/2013 1414   BUN 13 11/29/2017 1339   BUN 12 08/20/2015 1143   CREATININE 0.81 11/29/2017 1339   CREATININE 0.71 11/05/2017 0957   CALCIUM 10.3 11/29/2017 1339   PROT 7.7 11/29/2017 1339   PROT 7.5 08/20/2015 1143   PROT 7.5 03/02/2014 1034   ALBUMIN 4.1 11/29/2017 1339   ALBUMIN 4.7 08/20/2015 1143   ALBUMIN 3.5 03/02/2014 1034   AST 16 11/29/2017 1339   AST 13 (L) 03/02/2014 1034   ALT 11 (L) 11/29/2017 1339   ALT 21 03/02/2014 1034   ALKPHOS 85 11/29/2017 1339   ALKPHOS 76 03/02/2014 1034   BILITOT 0.9 11/29/2017 1339   BILITOT 0.3 08/20/2015 1143   BILITOT 0.2 03/02/2014 1034   GFRNONAA >60 11/29/2017 1339   GFRNONAA 77 07/30/2017 1115   GFRAA >60 11/29/2017 1339   GFRAA 89 07/30/2017 1115    No results found for: SPEP, UPEP  Lab Results  Component Value Date   WBC 5.0 11/29/2017   NEUTROABS 2.6 11/29/2017   HGB 14.6 11/29/2017   HCT 43.6 11/29/2017   MCV 92.0 11/29/2017   PLT 254 11/29/2017       Chemistry      Component Value Date/Time   NA 136 11/29/2017 1339   NA 141 08/20/2015 1143   K 3.4 (L) 11/29/2017 1339   CL 101 11/29/2017 1339   CO2 26 11/29/2017 1339   BUN 13 11/29/2017 1339   BUN 12 08/20/2015 1143   CREATININE 0.81 11/29/2017 1339   CREATININE 0.71 11/05/2017 0957      Component Value Date/Time   CALCIUM 10.3 11/29/2017 1339   ALKPHOS 85 11/29/2017 1339   ALKPHOS 76 03/02/2014 1034   AST 16 11/29/2017 1339   AST 13 (L) 03/02/2014 1034   ALT 11 (L) 11/29/2017 1339   ALT 21 03/02/2014 1034   BILITOT 0.9 11/29/2017 1339   BILITOT 0.3 08/20/2015 1143   BILITOT 0.2 03/02/2014 1034       RADIOGRAPHIC STUDIES: I have personally reviewed the radiological images as listed and agreed with the findings in the report. No results found.   ASSESSMENT & PLAN:  Carcinoma of overlapping sites of left breast in female, estrogen receptor positive ([place]) #Left breast cancer stage II ER PR positive HER-2 negative; finished 5 years in May 2017.  #Clinically no evidence of recurrence.  May 2019 mammogram within normal limits.  # Hypercalcemia- chronic [since 2016]; primary PTH; off HCTZ; awaiting endocrine evaluation. Today calcium is 10.3 within normal limits.  #Weight loss-intentional of 30 pounds.  Congratulated the patient on pursuing healthy choices.    # PN- G-1 on essential oils.   # follow up in 12 months/mammo screening 2020.   # cc; Dr.Lada.    Orders Placed This Encounter  Procedures  . MM 3D SCREEN BREAST BILATERAL    Standing Status:   Future    Standing Expiration Date:   06/02/2019    Order Specific Question:   Reason for Exam (SYMPTOM  OR DIAGNOSIS REQUIRED)    Answer:   breast cancer    Order Specific Question:   Preferred imaging location?    Answer:   [facility]   All questions were answered. The patient knows to call the clinic with any problems, questions or concerns.      Cammie Sickle, MD 11/29/2017 2:27 PM

## 2017-11-30 LAB — CANCER ANTIGEN 27.29: CAN 27.29: 11 U/mL (ref 0.0–38.6)

## 2017-12-16 ENCOUNTER — Other Ambulatory Visit: Payer: Self-pay

## 2017-12-16 DIAGNOSIS — F411 Generalized anxiety disorder: Secondary | ICD-10-CM

## 2017-12-16 MED ORDER — PAROXETINE HCL 10 MG PO TABS: 10.0000 mg | ORAL_TABLET | Freq: Every day | ORAL | 3 refills | Status: DC

## 2018-01-07 DIAGNOSIS — E213 Hyperparathyroidism, unspecified: Secondary | ICD-10-CM | POA: Insufficient documentation

## 2018-01-09 DIAGNOSIS — E559 Vitamin D deficiency, unspecified: Secondary | ICD-10-CM | POA: Insufficient documentation

## 2018-01-17 ENCOUNTER — Encounter: Payer: Self-pay | Admitting: Family Medicine

## 2018-01-31 ENCOUNTER — Other Ambulatory Visit: Payer: Self-pay | Admitting: Family Medicine

## 2018-03-19 ENCOUNTER — Telehealth: Payer: Self-pay | Admitting: Family Medicine

## 2018-03-19 DIAGNOSIS — E782 Mixed hyperlipidemia: Secondary | ICD-10-CM

## 2018-03-19 NOTE — Telephone Encounter (Signed)
I'd like to ask patient to have fasting lipids done in about two weeks (when I get back) Ask her to really watch her diet and come in one morning during the 2nd or 3rd week of September We need to see if she needs to consider a statin; we've been giving her a chance at diet alone Thank you

## 2018-03-21 ENCOUNTER — Telehealth: Payer: Self-pay

## 2018-03-21 NOTE — Telephone Encounter (Signed)
Left detailed voicemail

## 2018-03-21 NOTE — Telephone Encounter (Signed)
Pt is confused to where this came from she was not told at last time when she got labs that it was high

## 2018-03-21 NOTE — Telephone Encounter (Signed)
Copied from [place]. Topic: Quick Communication - Office Called Patient >> Mar 21, 2018 11:36 AM Cathrine Muster, CMA wrote: Reason for CRM: I'd like to ask patient to have fasting lipids done in about two weeks (when I get back) Ask her to really watch her diet and come in one morning during the 2nd or 3rd week of September We need to see if she needs to consider a statin; we've been giving her a chance at diet alone Thank you >> Mar 21, 2018 12:08 PM Ahmed Prima L wrote: Patient said she doesn't understand why she needs to come back in. She said that she didn't know her cholesterol was high and would not like me schedule a lab appt call back @ [phone]

## 2018-04-03 NOTE — Telephone Encounter (Signed)
Please see Dr. Trena Andrews notes from January labs; her last LDL was only 6 points lower and she had heard bad things about statins or something similar per the note; we are wanting to make sure she can get her LDL down Her LDL needs to be less than 70

## 2018-04-04 NOTE — Telephone Encounter (Signed)
Left detailed voicemail

## 2018-04-14 ENCOUNTER — Telehealth: Payer: Self-pay

## 2018-04-14 NOTE — Telephone Encounter (Signed)
Copied from [place] [id number]. Topic: Quick Communication - Other Results >> Apr 14, 2018  3:52 PM Samson Frederic wrote: Pt stopped by on 9.18.19 and stated her doctor wanted her to do lab work. She stated that she just did labs at [facility] and that she did not want to double pay for the same labs (lipids). I had the patient to fill out a medical release form for [place] to request the lab work and if Dr Sanda Klein felt she still need lab work then we would give her a call. I faxed the medical record request on 9.19.19

## 2018-04-15 NOTE — Telephone Encounter (Signed)
I just checked CareEverywhere and do not see a lipid I need a lipid panel

## 2018-04-15 NOTE — Telephone Encounter (Signed)
Pt.notified

## 2018-05-04 ENCOUNTER — Other Ambulatory Visit: Payer: Self-pay | Admitting: Family Medicine

## 2018-05-04 NOTE — Telephone Encounter (Signed)
Please ask patient to schedule an appt with me Last visit over six months ago I'd like to recheck her labs (cholesterol and potassium, for example) See how her calcium situation is doing We look forward to seeing her soon

## 2018-05-04 NOTE — Telephone Encounter (Signed)
Left voice message on [phone] @ 4:35 asking pt to return call to schedule appt. Informed pt that prescription has been sent to pharmacy

## 2018-05-09 ENCOUNTER — Ambulatory Visit: Payer: Medicare Other | Admitting: Family Medicine

## 2018-05-11 ENCOUNTER — Ambulatory Visit: Payer: Medicare Other | Admitting: Family Medicine

## 2018-05-12 ENCOUNTER — Encounter: Payer: Self-pay | Admitting: Family Medicine

## 2018-05-12 ENCOUNTER — Other Ambulatory Visit: Payer: Self-pay

## 2018-05-12 ENCOUNTER — Ambulatory Visit: Payer: Medicare Other | Admitting: Family Medicine

## 2018-05-12 DIAGNOSIS — E669 Obesity, unspecified: Secondary | ICD-10-CM

## 2018-05-12 DIAGNOSIS — R7303 Prediabetes: Secondary | ICD-10-CM

## 2018-05-12 DIAGNOSIS — I10 Essential (primary) hypertension: Secondary | ICD-10-CM | POA: Diagnosis not present

## 2018-05-12 DIAGNOSIS — I2510 Atherosclerotic heart disease of native coronary artery without angina pectoris: Secondary | ICD-10-CM | POA: Diagnosis not present

## 2018-05-12 DIAGNOSIS — T466X5A Adverse effect of antihyperlipidemic and antiarteriosclerotic drugs, initial encounter: Secondary | ICD-10-CM

## 2018-05-12 DIAGNOSIS — G720 Drug-induced myopathy: Secondary | ICD-10-CM

## 2018-05-12 MED ORDER — METOPROLOL SUCCINATE ER 25 MG PO TB24: 12.5000 mg | ORAL_TABLET | Freq: Every day | ORAL | 3 refills | Status: DC

## 2018-05-12 NOTE — Patient Instructions (Addendum)
Check out the information at familydoctor.org entitled "Nutrition for Weight Loss: What You Need to Know about Fad Diets" Try to lose between 1-2 pounds per week by taking in fewer calories and burning off more calories You can succeed by limiting portions, limiting foods dense in calories and fat, becoming more active, and drinking 8 glasses of water a day (64 ounces) Don't skip meals, especially breakfast, as skipping meals may alter your metabolism Do not use over-the-counter weight loss pills or gimmicks that claim rapid weight loss A healthy BMI (or body mass index) is between 18.5 and 24.9 You can calculate your ideal BMI at the NIH website ClubMonetize.fr  Try to limit saturated fats in your diet (bologna, hot dogs, barbeque, cheeseburgers, hamburgers, steak, bacon, sausage, cheese, etc.) and get more fresh fruits, vegetables, and whole grains  If you have not heard anything from my staff in a week about any orders/referrals/studies from today, please contact us here to follow-up [phone]

## 2018-05-12 NOTE — Assessment & Plan Note (Signed)
Continue aspirin; goal LDL less than 70

## 2018-05-12 NOTE — Assessment & Plan Note (Signed)
Check A1c today.

## 2018-05-12 NOTE — Assessment & Plan Note (Signed)
Patient unable to tolerate statins, tried more than one, cannot recall which names; will check lipids today and consider zetia; I asked if she would be willing to take a statin just once a week with CoQ 10; will see what lipids show

## 2018-05-12 NOTE — Assessment & Plan Note (Signed)
Encouraged weight loss 

## 2018-05-12 NOTE — Progress Notes (Signed)
BP 122/76   Pulse 85   Temp 97.7 F (36.5 C) (Oral)   Ht 5' 3.5" (1.613 m)   Wt 174 lb 6.4 oz (79.1 kg)   SpO2 99%   BMI 30.41 kg/m    Subjective:    Patient ID: Theresa Andrews, female    DOB: Jan 14, 1946, 72 y.o.   MRN: [id number]  HPI: Theresa Andrews is a 72 y.o. female  Chief Complaint  Patient presents with  . Follow-up  . Medication Refill    HPI  Patient is here for f/u  Prediabetes; patient denies any hx of diabetes; no medicines; no one in the family has diabetes primarily, sister got it from her breast cancer, died at age 10  Lab Results  Component Value Date   HGBA1C 6.1 (H) 05/12/2018   High cholesterol; has an air fryer; never eats fried foods, just rare occasions; bakes her meats or broils or air fryer; does eat some cheese; not very many eggs; not more than two a week; she is not taking a statin now; it causes her bones to ache  Lab Results  Component Value Date   CHOL 227 (H) 05/12/2018   HDL 61 05/12/2018   LDLCALC 147 (H) 05/12/2018   TRIG 87 05/12/2018   CHOLHDL 3.7 05/12/2018   HTN; checks BP occasionally; 130/70 range; not adding much salt  Atherosclerosis of coronary artery  She had high calcium and was sent to the endocrinologist; hyperparathyroidism diagnosed; not sure when she goes back; on OTC vitamin D 1000 iu daily; we talked about HCTZ; endo might stop that  Seeing oncologist at cancer center, breast cancer; Dr. Rogue Bussing; UTD with mammo; now getting screening mammogram Depression screen [facility] 2/9 05/12/2018 10/28/2017 07/30/2017 04/06/2017 01/04/2017  Decreased Interest 0 0 0 0 0  Down, Depressed, Hopeless 0 0 0 0 0  PHQ - 2 Score 0 0 0 0 0  Altered sleeping 0 - - - -  Tired, decreased energy 0 - - - -  Change in appetite 0 - - - -  Feeling bad or failure about yourself  0 - - - -  Trouble concentrating 0 - - - -  Moving slowly or fidgety/restless 0 - - - -  Suicidal thoughts 0 - - - -  PHQ-9 Score 0 - - - -  Difficult doing  work/chores Not difficult at all - - - -   Fall Risk  05/12/2018 10/28/2017 07/30/2017 04/06/2017 01/04/2017  Falls in the past year? No No No No No    Relevant past medical, surgical, family and social history reviewed Past Medical History:  Diagnosis Date  . Anxiety   . Breast cancer ([place])    pT2 pN0 cM0 (stage IIA) invasive mammary carcinoma of left outer breast status post lumpectomy and sentinel node study on July 14, 2010  . CAD (coronary artery disease)   . Depression   . H/O hypokalemia   . History of chemotherapy   . History of MI (myocardial infarction)   . Hyperglycemia   . Hyperlipidemia   . Hypertension   . Peripheral neuropathy due to chemotherapy ([place])   . Personal history of chemotherapy 2011   left breast ca  . Personal history of radiation therapy 2001   left breast ca   Past Surgical History:  Procedure Laterality Date  . ABDOMINAL HYSTERECTOMY    . BREAST BIOPSY Left 2008   neg  . BREAST BIOPSY Left 03/05/2010   invasive mammary carcinoma  .  BREAST LUMPECTOMY Left 07/14/2010   INVASIVE MAMMARY CARCINOMA WITH PROMINENT INFILTRATIVE PATTERN, negative margins  . CHOLECYSTECTOMY    . COLONOSCOPY WITH PROPOFOL N/A 11/22/2015   Procedure: COLONOSCOPY WITH PROPOFOL;  Surgeon: Hulen Luster, MD;  Location[facility] ENDOSCOPY;  Service: Gastroenterology;  Laterality: N/A;  . CORONARY ANGIOPLASTY WITH STENT PLACEMENT  2008   Family History  Problem Relation Age of Onset  . Breast cancer Sister 56  . Hypertension Mother   . Aneurysm Mother   . Heart attack Sister   . Birth defects Sister    Social History   Tobacco Use  . Smoking status: Former Smoker    Packs/day: 0.25    Years: 25.00    Pack years: 6.25    Types: Cigarettes    Last attempt to quit: 2008    Years since quitting: 11.8  . Smokeless tobacco: Never Used  . Tobacco comment: smoking cessation materials not required  Substance Use Topics  . Alcohol use: No    Comment: rare; holidays  . Drug  use: No     Office Visit from 05/12/2018 in [facility]  AUDIT-C Score  2      Interim medical history since last visit reviewed. Allergies and medications reviewed  Review of Systems Per HPI unless specifically indicated above     Objective:    BP 122/76   Pulse 85   Temp 97.7 F (36.5 C) (Oral)   Ht 5' 3.5" (1.613 m)   Wt 174 lb 6.4 oz (79.1 kg)   SpO2 99%   BMI 30.41 kg/m   Wt Readings from Last 3 Encounters:  05/12/18 174 lb 6.4 oz (79.1 kg)  11/29/17 173 lb 3.2 oz (78.6 kg)  10/28/17 174 lb 8 oz (79.2 kg)    Physical Exam  Constitutional: She appears well-developed and well-nourished. No distress.  HENT:  Head: Normocephalic and atraumatic.  Eyes: EOM are normal. No scleral icterus.  Neck: No thyromegaly present.  Cardiovascular: Normal rate, regular rhythm and normal heart sounds.  No murmur heard. Pulmonary/Chest: Effort normal and breath sounds normal. No respiratory distress. She has no wheezes.  Abdominal: Soft. Bowel sounds are normal. She exhibits no distension.  Musculoskeletal: She exhibits no edema.  Neurological: She is alert.  Skin: Skin is warm and dry. She is not diaphoretic. No pallor.  Psychiatric: She has a normal mood and affect. Her behavior is normal. Judgment and thought content normal.      Assessment & Plan:   Problem List Items Addressed This Visit      Cardiovascular and Mediastinum   Benign essential HTN (Chronic)    Controlled today      Atherosclerosis of coronary artery    Continue aspirin; goal LDL less than 70      Relevant Orders   Lipid panel (Completed)     Musculoskeletal and Integument   Statin myopathy    Patient unable to tolerate statins, tried more than one, cannot recall which names; will check lipids today and consider zetia; I asked if she would be willing to take a statin just once a week with CoQ 10; will see what lipids show        Other   Prediabetes    Check A1c today       Relevant Orders   Hemoglobin A1c (Completed)   Basic metabolic panel (Completed)   Obesity (BMI 30-39.9)    Encouraged weight loss          Follow  up plan: Return in about 6 months (around 11/11/2018) for follow-up visit with Dr. Sanda Klein; medicare wellness visit once a year when due.  An after-visit summary was printed and given to the patient at [place].  Please see the patient instructions which may contain other information and recommendations beyond what is mentioned above in the assessment and plan.  No orders of the defined types were placed in this encounter.   Orders Placed This Encounter  Procedures  . Lipid panel  . Hemoglobin A1c  . Basic metabolic panel

## 2018-05-12 NOTE — Assessment & Plan Note (Signed)
Controlled today 

## 2018-05-13 ENCOUNTER — Other Ambulatory Visit: Payer: Self-pay | Admitting: Family Medicine

## 2018-05-13 LAB — BASIC METABOLIC PANEL
BUN: 16 mg/dL (ref 7–25)
CO2: 29 mmol/L (ref 20–32)
Calcium: 10.7 mg/dL — ABNORMAL HIGH (ref 8.6–10.4)
Chloride: 101 mmol/L (ref 98–110)
Creat: 0.69 mg/dL (ref 0.60–0.93)
Glucose, Bld: 122 mg/dL — ABNORMAL HIGH (ref 65–99)
POTASSIUM: 3.4 mmol/L — AB (ref 3.5–5.3)
SODIUM: 140 mmol/L (ref 135–146)

## 2018-05-13 LAB — HEMOGLOBIN A1C
EAG (MMOL/L): 7.1 (calc)
Hgb A1c MFr Bld: 6.1 % of total Hgb — ABNORMAL HIGH (ref <5.7)
MEAN PLASMA GLUCOSE: 128 (calc)

## 2018-05-13 LAB — LIPID PANEL
CHOLESTEROL: 227 mg/dL — AB (ref <200)
HDL: 61 mg/dL (ref >50)
LDL CHOLESTEROL (CALC): 147 mg/dL — AB
Non-HDL Cholesterol (Calc): 166 mg/dL (calc) — ABNORMAL HIGH (ref <130)
Total CHOL/HDL Ratio: 3.7 (calc) (ref <5.0)
Triglycerides: 87 mg/dL (ref <150)

## 2018-05-13 MED ORDER — METOPROLOL SUCCINATE ER 25 MG PO TB24: 12.5000 mg | ORAL_TABLET | Freq: Every day | ORAL | 3 refills | Status: DC

## 2018-05-13 MED ORDER — HYDROCHLOROTHIAZIDE 12.5 MG PO CAPS: 12.5000 mg | ORAL_CAPSULE | Freq: Every day | ORAL | Status: DC

## 2018-05-13 MED ORDER — ROSUVASTATIN CALCIUM 10 MG PO TABS: ORAL_TABLET | ORAL | 3 refills | Status: DC

## 2018-05-13 MED ORDER — METOPROLOL SUCCINATE ER 25 MG PO TB24: 25.0000 mg | ORAL_TABLET | Freq: Every day | ORAL | 3 refills | Status: DC

## 2018-05-13 MED ORDER — EZETIMIBE 10 MG PO TABS: 10.0000 mg | ORAL_TABLET | Freq: Every day | ORAL | 3 refills | Status: DC

## 2018-05-13 NOTE — Progress Notes (Signed)
Patient does not want to stop the hctz or increase the beta-blocker; declines crestor meds updated

## 2018-05-13 NOTE — Progress Notes (Signed)
Start statin as tolerated; zetia, CoQ10 Stop HCTZ and increase beta-blocker; return for pulse and BP in 10-12 days

## 2018-06-26 ENCOUNTER — Other Ambulatory Visit: Payer: Self-pay | Admitting: Family Medicine

## 2018-08-02 ENCOUNTER — Ambulatory Visit: Payer: Medicare Other

## 2018-08-10 ENCOUNTER — Other Ambulatory Visit: Payer: Self-pay | Admitting: Family Medicine

## 2018-08-10 NOTE — Telephone Encounter (Signed)
Please ask patient to come by for a BP check and BMP in the next few days I'll refill med, but want to see how her labs look

## 2018-08-10 NOTE — Telephone Encounter (Signed)
Pt notified, nurse visit schedules

## 2018-08-12 ENCOUNTER — Ambulatory Visit: Payer: Medicare Other

## 2018-08-12 VITALS — BP 142/82 | HR 69

## 2018-08-12 DIAGNOSIS — I10 Essential (primary) hypertension: Secondary | ICD-10-CM

## 2018-08-12 DIAGNOSIS — Z5181 Encounter for therapeutic drug level monitoring: Secondary | ICD-10-CM

## 2018-08-12 LAB — LIPID PANEL
CHOL/HDL RATIO: 3.4 (calc) (ref <5.0)
Cholesterol: 192 mg/dL (ref <200)
HDL: 57 mg/dL (ref >50)
LDL Cholesterol (Calc): 118 mg/dL (calc) — ABNORMAL HIGH
NON-HDL CHOLESTEROL (CALC): 135 mg/dL — AB (ref <130)
Triglycerides: 78 mg/dL (ref <150)

## 2018-08-12 LAB — BASIC METABOLIC PANEL WITH GFR
BUN: 15 mg/dL (ref 7–25)
CO2: 26 mmol/L (ref 20–32)
CREATININE: 0.79 mg/dL (ref 0.60–0.93)
Calcium: 10.6 mg/dL — ABNORMAL HIGH (ref 8.6–10.4)
Chloride: 104 mmol/L (ref 98–110)
GFR, EST NON AFRICAN AMERICAN: 75 mL/min/{1.73_m2} (ref >=60)
GFR, Est African American: 87 mL/min/{1.73_m2} (ref >=60)
GLUCOSE: 119 mg/dL — AB (ref 65–99)
POTASSIUM: 3.7 mmol/L (ref 3.5–5.3)
SODIUM: 142 mmol/L (ref 135–146)

## 2018-08-12 NOTE — Progress Notes (Signed)
Patient reported no additional swelling in the legs after being out of hctz and her BP has been in the 130s at home; thinks today is white coat HTN

## 2018-08-12 NOTE — Progress Notes (Signed)
Beta-blocker was increased several months ago and patient never followed up with blood pressure check. Dr.Lada aslo wanted her to stop HCTZ but patient did not due to swelling and it was restarted.  She has been out over the last 3-days.  Patients blood pressure today is 142/82  And pulse 69 consulted with Dr. lada we will continue current regimen and d/c HCTZ.  Patient will get BMP today.

## 2018-08-30 ENCOUNTER — Ambulatory Visit: Payer: Medicare Other | Admitting: Family Medicine

## 2018-08-30 ENCOUNTER — Encounter: Payer: Self-pay | Admitting: Family Medicine

## 2018-08-30 DIAGNOSIS — I10 Essential (primary) hypertension: Secondary | ICD-10-CM

## 2018-08-30 DIAGNOSIS — T466X5A Adverse effect of antihyperlipidemic and antiarteriosclerotic drugs, initial encounter: Secondary | ICD-10-CM

## 2018-08-30 DIAGNOSIS — G720 Drug-induced myopathy: Secondary | ICD-10-CM

## 2018-08-30 DIAGNOSIS — E669 Obesity, unspecified: Secondary | ICD-10-CM

## 2018-08-30 DIAGNOSIS — E782 Mixed hyperlipidemia: Secondary | ICD-10-CM | POA: Diagnosis not present

## 2018-08-30 DIAGNOSIS — R7303 Prediabetes: Secondary | ICD-10-CM

## 2018-08-30 MED ORDER — METOPROLOL SUCCINATE ER 25 MG PO TB24: 25.0000 mg | ORAL_TABLET | Freq: Every day | ORAL | 3 refills | Status: DC

## 2018-08-30 NOTE — Assessment & Plan Note (Signed)
Did not tolerate statin; really work on diet and weight loss

## 2018-08-30 NOTE — Assessment & Plan Note (Signed)
Encouraged weight loss 

## 2018-08-30 NOTE — Assessment & Plan Note (Signed)
Recommended paying attention to calories in and calories out; she declined referral to nutritionist; she'll buckle down and be more strict with intake, etc.; advised 10 pounds over the next 2-3 months; that will help BP

## 2018-08-30 NOTE — Patient Instructions (Addendum)
Try to follow the DASH guidelines (DASH stands for Dietary Approaches to Stop Hypertension). Try to limit the sodium in your diet to no more than 1,500mg  of sodium per day. Certainly try to not exceed 2,000 mg per day at the very most. Do not add salt when cooking or at the table.  Check the sodium amount on labels when shopping, and choose items lower in sodium when given a choice. Avoid or limit foods that already contain a lot of sodium. Eat a diet rich in fruits and vegetables and whole grains, and try to lose weight if overweight or obese  Check out the information at familydoctor.org entitled "Nutrition for Weight Loss: What You Need to Know about Fad Diets" Try to lose between 1-2 pounds per week by taking in fewer calories and burning off more calories You can succeed by limiting portions, limiting foods dense in calories and fat, becoming more active, and drinking 8 glasses of water a day (64 ounces) Don't skip meals, especially breakfast, as skipping meals may alter your metabolism Do not use over-the-counter weight loss pills or gimmicks that claim rapid weight loss A healthy BMI (or body mass index) is between 18.5 and 24.9 You can calculate your ideal BMI at the [place] website ClubMonetize.fr   Obesity, Adult Obesity is the condition of having too much total body fat. Being overweight or obese means that your weight is greater than what is considered healthy for your body size. Obesity is determined by a measurement called BMI. BMI is an estimate of body fat and is calculated from height and weight. For adults, a BMI of 30 or higher is considered obese. Obesity can eventually lead to other health concerns and major illnesses, including:  Stroke.  Coronary artery disease (CAD).  Type 2 diabetes.  Some types of cancer, including cancers of the colon, breast, uterus, and gallbladder.  Osteoarthritis.  High blood pressure  (hypertension).  High cholesterol.  Sleep apnea.  Gallbladder stones.  Infertility problems. What are the causes? The main cause of obesity is taking in (consuming) more calories than your body uses for energy. Other factors that contribute to this condition may include:  Being born with genes that make you more likely to become obese.  Having a medical condition that causes obesity. These conditions include: ? Hypothyroidism. ? Polycystic ovarian syndrome (PCOS). ? Binge-eating disorder. ? Cushing syndrome.  Taking certain medicines, such as steroids, antidepressants, and seizure medicines.  Not being physically active (sedentary lifestyle).  Living where there are limited places to exercise safely or buy healthy foods.  Not getting enough sleep. What increases the risk? The following factors may increase your risk of this condition:  Having a family history of obesity.  Being a woman of African-American descent.  Being a man of Hispanic descent. What are the signs or symptoms? Having excessive body fat is the main symptom of this condition. How is this diagnosed? This condition may be diagnosed based on:  Your symptoms.  Your medical history.  A physical exam. Your health care provider may measure: ? Your BMI. If you are an adult with a BMI between 25 and less than 30, you are considered overweight. If you are an adult with a BMI of 30 or higher, you are considered obese. ? The distances around your hips and your waist (circumferences). These may be compared to each other to help diagnose your condition. ? Your skinfold thickness. Your health care provider may gently pinch a fold of your skin  and measure it. How is this treated? Treatment for this condition often includes changing your lifestyle. Treatment may include some or all of the following:  Dietary changes. Work with your health care provider and a dietitian to set a weight-loss goal that is healthy and  reasonable for you. Dietary changes may include eating: ? Smaller portions. A portion size is the amount of a particular food that is healthy for you to eat at one time. This varies from person to person. ? Low-calorie or low-fat options. ? More whole grains, fruits, and vegetables.  Regular physical activity. This may include aerobic activity (cardio) and strength training.  Medicine to help you lose weight. Your health care provider may prescribe medicine if you are unable to lose 1 pound a week after 6 weeks of eating more healthily and doing more physical activity.  Surgery. Surgical options may include gastric banding and gastric bypass. Surgery may be done if: ? Other treatments have not helped to improve your condition. ? You have a BMI of 40 or higher. ? You have life-threatening health problems related to obesity. Follow these instructions at home:  Eating and drinking   Follow recommendations from your health care provider about what you eat and drink. Your health care provider may advise you to: ? Limit fast foods, sweets, and processed snack foods. ? Choose low-fat options, such as low-fat milk instead of whole milk. ? Eat 5 or more servings of fruits or vegetables every day. ? Eat at home more often. This gives you more control over what you eat. ? Choose healthy foods when you eat out. ? Learn what a healthy portion size is. ? Keep low-fat snacks on hand. ? Avoid sugary drinks, such as soda, fruit juice, iced tea sweetened with sugar, and flavored milk. ? Eat a healthy breakfast.  Drink enough water to keep your urine clear or pale yellow.  Do not go without eating for long periods of time (do not fast) or follow a fad diet. Fasting and fad diets can be unhealthy and even dangerous. Physical Activity  Exercise regularly, as told by your health care provider. Ask your health care provider what types of exercise are safe for you and how often you should exercise.  Warm  up and stretch before being active.  Cool down and stretch after being active.  Rest between periods of activity. Lifestyle  Limit the time that you spend in front of your TV, computer, or video game system.  Find ways to reward yourself that do not involve food.  Limit alcohol intake to no more than 1 drink a day for nonpregnant women and 2 drinks a day for men. One drink equals 12 oz of beer, 5 oz of wine, or 1 oz of hard liquor. General instructions  Keep a weight loss journal to keep track of the food you eat and how much you exercise you get.  Take over-the-counter and prescription medicines only as told by your health care provider.  Take vitamins and supplements only as told by your health care provider.  Consider joining a support group. Your health care provider may be able to recommend a support group.  Keep all follow-up visits as told by your health care provider. This is important. Contact a health care provider if:  You are unable to meet your weight loss goal after 6 weeks of dietary and lifestyle changes. This information is not intended to replace advice given to you by your health care provider. Make   sure you discuss any questions you have with your health care provider. Document Released: 08/20/2004 Document Revised: 12/16/2015 Document Reviewed: 05/01/2015 Elsevier Interactive Patient Education  2019 Elsevier Inc.  Preventing Unhealthy [employer], Adult Staying at a healthy weight is important to your overall health. When fat builds up in your body, you may become overweight or obese. Being overweight or obese increases your risk of developing certain health problems, such as heart disease, diabetes, sleeping problems, joint problems, and some types of cancer. Unhealthy weight gain is often the result of making unhealthy food choices or not getting enough exercise. You can make changes to your lifestyle to prevent obesity and stay as healthy as possible. What  nutrition changes can be made?   Eat only as much as your body needs. To do this: ? Pay attention to signs that you are hungry or full. Stop eating as soon as you feel full. ? If you feel hungry, try drinking water first before eating. Drink enough water so your urine is clear or pale yellow. ? Eat smaller portions. Pay attention to portion sizes when eating out. ? Look at serving sizes on food labels. Most foods contain more than one serving per container. ? Eat the recommended number of calories for your gender and activity level. For most active people, a daily total of 2,000 calories is appropriate. If you are trying to lose weight or are not very active, you may need to eat fewer calories. Talk with your health care provider or a diet and nutrition specialist (dietitian) about how many calories you need each day.  Choose healthy foods, such as: ? Fruits and vegetables. At each meal, try to fill at least half of your plate with fruits and vegetables. ? Whole grains, such as whole-wheat bread, brown rice, and quinoa. ? Lean meats, such as chicken or fish. ? Other healthy proteins, such as beans, eggs, or tofu. ? Healthy fats, such as nuts, seeds, fatty fish, and olive oil. ? Low-fat or fat-free dairy products.  Check food labels, and avoid food and drinks that: ? Are high in calories. ? Have added sugar. ? Are high in sodium. ? Have saturated fats or trans fats.  Cook foods in healthier ways, such as by baking, broiling, or grilling.  Make a meal plan for the week, and shop with a grocery list to help you stay on track with your purchases. Try to avoid going to the grocery store when you are hungry.  When grocery shopping, try to shop around the outside of the store first, where the fresh foods are. Doing this helps you to avoid prepackaged foods, which can be high in sugar, salt (sodium), and fat. What lifestyle changes can be made?   Exercise for 30 or more minutes on 5 or more  days each week. Exercising may include brisk walking, yard work, biking, running, swimming, and team sports like basketball and soccer. Ask your health care provider which exercises are safe for you.  Do muscle-strengthening activities, such as lifting weights or using resistance bands, on 2 or more days a week.  Do not use any products that contain nicotine or tobacco, such as cigarettes and e-cigarettes. If you need help quitting, ask your health care provider.  Limit alcohol intake to no more than 1 drink a day for nonpregnant women and 2 drinks a day for men. One drink equals 12 oz of beer, 5 oz of wine, or 1 oz of hard liquor.  Try to get  7-9 hours of sleep each night. What other changes can be made?  Keep a food and activity journal to keep track of: ? What you ate and how many calories you had. Remember to count the calories in sauces, dressings, and side dishes. ? Whether you were active, and what exercises you did. ? Your calorie, weight, and activity goals.  Check your weight regularly. Track any changes. If you notice you have gained weight, make changes to your diet or activity routine.  Avoid taking weight-loss medicines or supplements. Talk to your health care provider before starting any new medicine or supplement.  Talk to your health care provider before trying any new diet or exercise plan. Why are these changes important? Eating healthy, staying active, and having healthy habits can help you to prevent obesity. Those changes also:  Help you manage stress and emotions.  Help you connect with friends and family.  Improve your self-esteem.  Improve your sleep.  Prevent long-term health problems. What can happen if changes are not made? Being obese or overweight can cause you to develop joint or bone problems, which can make it hard for you to stay active or do activities you enjoy. Being obese or overweight also puts stress on your heart and lungs and can lead to  health problems like diabetes, heart disease, and some cancers. Where to find more information Talk with your health care provider or a dietitian about healthy eating and healthy lifestyle choices. You may also find information from:  U.S. Department of Agriculture, MyPlate: www.choosemyplate.gov  American Heart Association: www.heart.org  Centers for Disease Control and Prevention: www.cdc.gov Summary  Staying at a healthy weight is important to your overall health. It helps you to prevent certain diseases and health problems, such as heart disease, diabetes, joint problems, sleep disorders, and some types of cancer.  Being obese or overweight can cause you to develop joint or bone problems, which can make it hard for you to stay active or do activities you enjoy.  You can prevent unhealthy weight gain by eating a healthy diet, exercising regularly, not smoking, limiting alcohol, and getting enough sleep.  Talk with your health care provider or a dietitian for guidance about healthy eating and healthy lifestyle choices. This information is not intended to replace advice given to you by your health care provider. Make sure you discuss any questions you have with your health care provider. Document Released: 07/14/2016 Document Revised: 04/23/2017 Document Reviewed: 08/19/2016 Elsevier Interactive Patient Education  2019 Elsevier Inc.  

## 2018-08-30 NOTE — Assessment & Plan Note (Signed)
Seeing endocrinologist; I do not want her on HCTZ

## 2018-08-30 NOTE — Progress Notes (Signed)
BP 140/78   Pulse 80   Temp (!) 97.3 F (36.3 C)   Resp 16   Ht 5\' 4"  (1.626 m)   Wt 179 lb 9.6 oz (81.5 kg)   SpO2 95%   BMI 30.83 kg/m    Subjective:    Patient ID: Theresa Andrews, female    DOB: 12-31-1945, 73 y.o.   MRN: [id number]  HPI: Theresa Andrews is a 73 y.o. female  Chief Complaint  Patient presents with  . Follow-up    HPI She is thinking that she needs HCTZ She has tightness in her legs; wants to be on the thiazide BP has been higher, 140 something the last few times She has seen Dr. Gabriel Carina about the high calcium On four agents right now with the low dose HCTZ, but will stay on the other three Not much salt shaker user  She has been gaining weight over the last several months She does get on the treadmill; didn't eat as well over the holidays Can do 3.1 mph, can for 45 minutes Thought about getting back to getting strict with what she eats; "that's part of the problem" She stopped sweets altogether, "well occasionally" a small piece of cake at a birthday party She has a little drink of cognac twice a week with Coke; will stop doing that  Prediabetes  No dry mouth We discussed bread, small italian bread, just two slices Not eating much rice Lab Results  Component Value Date   HGBA1C 6.1 (H) 05/12/2018    High cholesterol; started back to taking her cholesterol medicine  Lab Results  Component Value Date   CHOL 192 08/12/2018   HDL 57 08/12/2018   LDLCALC 118 (H) 08/12/2018   TRIG 78 08/12/2018   CHOLHDL 3.4 08/12/2018   Neuropathy from chemotherapy; the statin worsened that  Depression screen [facility] 2/9 08/30/2018 05/12/2018 10/28/2017 07/30/2017 04/06/2017  Decreased Interest 0 0 0 0 0  Down, Depressed, Hopeless 0 0 0 0 0  PHQ - 2 Score 0 0 0 0 0  Altered sleeping 0 0 - - -  Tired, decreased energy 0 0 - - -  Change in appetite 0 0 - - -  Feeling bad or failure about yourself  0 0 - - -  Trouble concentrating 0 0 - - -  Moving slowly or  fidgety/restless 0 0 - - -  Suicidal thoughts 0 0 - - -  PHQ-9 Score 0 0 - - -  Difficult doing work/chores Not difficult at all Not difficult at all - - -   Fall Risk  08/30/2018 05/12/2018 10/28/2017 07/30/2017 04/06/2017  Falls in the past year? 0 No No No No  Number falls in past yr: 0 - - - -  Injury with Fall? 0 - - - -    Relevant past medical, surgical, family and social history reviewed Past Medical History:  Diagnosis Date  . Anxiety   . Breast cancer ([place])    pT2 pN0 cM0 (stage IIA) invasive mammary carcinoma of left outer breast status post lumpectomy and sentinel node study on July 14, 2010  . CAD (coronary artery disease)   . Depression   . H/O hypokalemia   . History of chemotherapy   . History of MI (myocardial infarction)   . Hyperglycemia   . Hyperlipidemia   . Hypertension   . Peripheral neuropathy due to chemotherapy ([place])   . Personal history of chemotherapy 2011   left breast ca  .  Personal history of radiation therapy 2001   left breast ca   Past Surgical History:  Procedure Laterality Date  . ABDOMINAL HYSTERECTOMY    . BREAST BIOPSY Left 2008   neg  . BREAST BIOPSY Left 03/05/2010   invasive mammary carcinoma  . BREAST LUMPECTOMY Left 07/14/2010   INVASIVE MAMMARY CARCINOMA WITH PROMINENT INFILTRATIVE PATTERN, negative margins  . CHOLECYSTECTOMY    . COLONOSCOPY WITH PROPOFOL N/A 11/22/2015   Procedure: COLONOSCOPY WITH PROPOFOL;  Surgeon: Hulen Luster, MD;  Location: [facility] ENDOSCOPY;  Service: Gastroenterology;  Laterality: N/A;  . CORONARY ANGIOPLASTY WITH STENT PLACEMENT  2008   Family History  Problem Relation Age of Onset  . Breast cancer Sister 33  . Hypertension Mother   . Aneurysm Mother   . Heart attack Sister   . Birth defects Sister    Social History   Tobacco Use  . Smoking status: Former Smoker    Packs/day: 0.25    Years: 25.00    Pack years: 6.25    Types: Cigarettes    Last attempt to quit: 2008    Years since quitting:  12.1  . Smokeless tobacco: Never Used  . Tobacco comment: smoking cessation materials not required  Substance Use Topics  . Alcohol use: No    Comment: rare; holidays  . Drug use: No     Office Visit from 08/30/2018 in [facility]  AUDIT-C Score  3      Interim medical history since last visit reviewed. Allergies and medications reviewed  Review of Systems  Constitutional: Negative for unexpected weight change.  Cardiovascular: Negative for chest pain.   Per HPI unless specifically indicated above     Objective:    BP 140/78   Pulse 80   Temp (!) 97.3 F (36.3 C)   Resp 16   Ht 5\' 4"  (1.626 m)   Wt 179 lb 9.6 oz (81.5 kg)   SpO2 95%   BMI 30.83 kg/m   Wt Readings from Last 3 Encounters:  08/30/18 179 lb 9.6 oz (81.5 kg)  05/12/18 174 lb 6.4 oz (79.1 kg)  11/29/17 173 lb 3.2 oz (78.6 kg)    Physical Exam Constitutional:      General: She is not in acute distress.    Appearance: She is well-developed. She is obese. She is not diaphoretic.  HENT:     Head: Normocephalic and atraumatic.  Eyes:     General: No scleral icterus. Neck:     Thyroid: No thyromegaly.  Cardiovascular:     Rate and Rhythm: Normal rate and regular rhythm.     Heart sounds: Normal heart sounds. No murmur.  Pulmonary:     Effort: Pulmonary effort is normal. No respiratory distress.     Breath sounds: Normal breath sounds. No wheezing.  Abdominal:     General: Bowel sounds are normal. There is no distension.     Palpations: Abdomen is soft.  Skin:    General: Skin is warm and dry.     Coloration: Skin is not pale.  Neurological:     Mental Status: She is alert.  Psychiatric:        Behavior: Behavior normal.        Thought Content: Thought content normal.        Judgment: Judgment normal.     Results for orders placed or performed in visit on 34/19/37  BASIC METABOLIC PANEL WITH GFR  Result Value Ref Range   Glucose, Bld 119 (  H) 65 - 99 mg/dL   BUN 15 7 - 25  mg/dL   Creat 0.79 0.60 - 0.93 mg/dL   GFR, Est Non African American 75 > OR = 60 mL/min/1.66m2   GFR, Est African American 87 > OR = 60 mL/min/1.50m2   BUN/Creatinine Ratio NOT APPLICABLE 6 - 22 (calc)   Sodium 142 135 - 146 mmol/L   Potassium 3.7 3.5 - 5.3 mmol/L   Chloride 104 98 - 110 mmol/L   CO2 26 20 - 32 mmol/L   Calcium 10.6 (H) 8.6 - 10.4 mg/dL  Lipid panel  Result Value Ref Range   Cholesterol 192 <200 mg/dL   HDL 57 >50 mg/dL   Triglycerides 78 <150 mg/dL   LDL Cholesterol (Calc) 118 (H) mg/dL (calc)   Total CHOL/HDL Ratio 3.4 <5.0 (calc)   Non-HDL Cholesterol (Calc) 135 (H) <130 mg/dL (calc)      Assessment & Plan:   Problem List Items Addressed This Visit      Cardiovascular and Mediastinum   Benign essential HTN (Chronic)    I do not want her on HCTZ      Relevant Medications   metoprolol succinate (TOPROL-XL) 25 MG 24 hr tablet     Musculoskeletal and Integument   Statin myopathy    Did not tolerate statin; really work on diet and weight loss        Other   Prediabetes    Encouraged weight loss      Obesity (BMI 30-39.9)    Recommended paying attention to calories in and calories out; she declined referral to nutritionist; she'll buckle down and be more strict with intake, etc.; advised 10 pounds over the next 2-3 months; that will help BP      Hyperlipidemia    Explained plaque build-up, showed model of atherosclerosis at different stages      Relevant Medications   metoprolol succinate (TOPROL-XL) 25 MG 24 hr tablet   Hypercalcemia    Seeing endocrinologist; I do not want her on HCTZ          Follow up plan: No follow-ups on file.  An after-visit summary was printed and given to the patient at [place].  Please see the patient instructions which may contain other information and recommendations beyond what is mentioned above in the assessment and plan.  Meds ordered this encounter  Medications  . metoprolol succinate (TOPROL-XL) 25  MG 24 hr tablet    Sig: Take 1 tablet (25 mg total) by mouth daily.    Dispense:  90 tablet    Refill:  3    Changing up things    No orders of the defined types were placed in this encounter.

## 2018-08-30 NOTE — Assessment & Plan Note (Signed)
Explained plaque build-up, showed model of atherosclerosis at different stages

## 2018-08-30 NOTE — Assessment & Plan Note (Signed)
I do not want her on HCTZ

## 2018-09-09 ENCOUNTER — Ambulatory Visit: Payer: Medicare Other

## 2018-09-13 ENCOUNTER — Ambulatory Visit (INDEPENDENT_AMBULATORY_CARE_PROVIDER_SITE_OTHER): Payer: Medicare Other

## 2018-09-13 ENCOUNTER — Ambulatory Visit: Payer: Medicare Other

## 2018-09-13 ENCOUNTER — Ambulatory Visit: Payer: Self-pay

## 2018-09-13 VITALS — BP 138/80 | HR 54 | Wt 184.2 lb

## 2018-09-13 VITALS — BP 138/84 | HR 59 | Temp 97.8°F | Resp 16 | Ht 64.0 in | Wt 184.2 lb

## 2018-09-13 DIAGNOSIS — Z0000 Encounter for general adult medical examination without abnormal findings: Secondary | ICD-10-CM

## 2018-09-13 DIAGNOSIS — I10 Essential (primary) hypertension: Secondary | ICD-10-CM

## 2018-09-13 NOTE — Progress Notes (Signed)
Patient here for blood pressure check since at last visit elevated.  Blood pressure today is 138/80 pulse 54.  Consulted with Dr. lada and we will decrease Metoprolol to half a pill and re check in 1 week.  Weight was 184.2.

## 2018-09-13 NOTE — Progress Notes (Signed)
Subjective:   Theresa Andrews is a 73 y.o. female who presents for Medicare Annual (Subsequent) preventive examination.  Review of Systems:   Cardiac Risk Factors include: advanced age (>79men, >43 women);dyslipidemia;hypertension;obesity (BMI >30kg/m2)     Objective:     Vitals: BP 138/84 (BP Location: Left Arm, Patient Position: Sitting, Cuff Size: Normal)   Pulse (!) 59   Temp 97.8 F (36.6 C) (Oral)   Resp 16   Ht 5\' 4"  (1.626 m)   Wt 184 lb 3.2 oz (83.6 kg)   SpO2 99%   BMI 31.62 kg/m   Body mass index is 31.62 kg/m.  Advanced Directives 09/13/2018 07/30/2017 04/06/2017 01/04/2017 12/10/2016 09/29/2016 06/29/2016  Does Patient Have a Medical Advance Directive? No No No No No No No  Would patient like information on creating a medical advance directive? Yes (MAU/Ambulatory/Procedural Areas - Information given) Yes (MAU/Ambulatory/Procedural Areas - Information given) - - - - -    Tobacco Social History   Tobacco Use  Smoking Status Former Smoker  . Packs/day: 0.25  . Years: 25.00  . Pack years: 6.25  . Types: Cigarettes  . Last attempt to quit: 2008  . Years since quitting: 12.1  Smokeless Tobacco Never Used  Tobacco Comment   smoking cessation materials not required     Counseling given: Not Answered Comment: smoking cessation materials not required   Clinical Intake:  Pre-visit preparation completed: Yes  Pain : No/denies pain    BMI - recorded: 31.62 Nutritional Status: BMI > 30  Obese Nutritional Risks: None Diabetes: No  What is the last grade level you completed in school?: some college  Interpreter Needed?: No  Information entered by :: [name] LPN  Past Medical History:  Diagnosis Date  . Anxiety   . Breast cancer ([place])    pT2 pN0 cM0 (stage IIA) invasive mammary carcinoma of left outer breast status post lumpectomy and sentinel node study on July 14, 2010  . CAD (coronary artery disease)   . Depression   . H/O hypokalemia   .  History of chemotherapy   . History of MI (myocardial infarction)   . Hyperglycemia   . Hyperlipidemia   . Hypertension   . Peripheral neuropathy due to chemotherapy ([place])   . Personal history of chemotherapy 2011   left breast ca  . Personal history of radiation therapy 2001   left breast ca   Past Surgical History:  Procedure Laterality Date  . ABDOMINAL HYSTERECTOMY    . BREAST BIOPSY Left 2008   neg  . BREAST BIOPSY Left 03/05/2010   invasive mammary carcinoma  . BREAST LUMPECTOMY Left 07/14/2010   INVASIVE MAMMARY CARCINOMA WITH PROMINENT INFILTRATIVE PATTERN, negative margins  . CHOLECYSTECTOMY    . COLONOSCOPY WITH PROPOFOL N/A 11/22/2015   Procedure: COLONOSCOPY WITH PROPOFOL;  Surgeon: Hulen Luster, MD;  Location: [facility] ENDOSCOPY;  Service: Gastroenterology;  Laterality: N/A;  . CORONARY ANGIOPLASTY WITH STENT PLACEMENT  2008   Family History  Problem Relation Age of Onset  . Breast cancer Sister 20  . Hypertension Mother   . Aneurysm Mother   . Heart attack Sister   . Birth defects Sister    Social History   Socioeconomic History  . Marital status: Divorced    Spouse name: Not on file  . Number of children: 3  . Years of education: some college, no degree  . Highest education level: 12th grade  Occupational History    Employer: RETIRED  Social Needs  .  Financial resource strain: Not hard at all  . Food insecurity:    Worry: Never true    Inability: Never true  . Transportation needs:    Medical: No    Non-medical: No  Tobacco Use  . Smoking status: Former Smoker    Packs/day: 0.25    Years: 25.00    Pack years: 6.25    Types: Cigarettes    Last attempt to quit: 2008    Years since quitting: 12.1  . Smokeless tobacco: Never Used  . Tobacco comment: smoking cessation materials not required  Substance and Sexual Activity  . Alcohol use: No    Comment: rare; holidays  . Drug use: No  . Sexual activity: Not Currently  Lifestyle  . Physical activity:      Days per week: 5 days    Minutes per session: 30 min  . Stress: Not at all  Relationships  . Social connections:    Talks on phone: More than three times a week    Gets together: Three times a week    Attends religious service: Never    Active member of club or organization: No    Attends meetings of clubs or organizations: Never    Relationship status: Divorced  Other Topics Concern  . Not on file  Social History Narrative  . Not on file    Outpatient Encounter Medications as of 09/13/2018  Medication Sig  . amLODipine (NORVASC) 10 MG tablet Take 1 tablet (10 mg total) by mouth daily.  Marland Kitchen aspirin 81 MG EC tablet Take 81 mg by mouth daily.  . cholecalciferol (VITAMIN D) 1000 units tablet Take 1,000 Units by mouth daily.  . clindamycin-benzoyl peroxide (BENZACLIN) gel Apply 1 application topically 2 (two) times daily.   Marland Kitchen ezetimibe (ZETIA) 10 MG tablet Take 1 tablet (10 mg total) by mouth daily.  . metoprolol succinate (TOPROL-XL) 25 MG 24 hr tablet Take 1 tablet (25 mg total) by mouth daily. (Patient taking differently: Take 12.5 mg by mouth daily. Pt to start taking 1/2 tablet 09/13/18)  . PARoxetine (PAXIL) 10 MG tablet Take 1 tablet (10 mg total) by mouth daily.  Marland Kitchen telmisartan (MICARDIS) 80 MG tablet TAKE 1 TABLET BY MOUTH ONCE DAILY FOR BLOOD PRESSURE (REPLACES LOSARTAN)  . tretinoin (RETIN-A) 0.025 % cream Apply topically at bedtime.    No facility-administered encounter medications on file as of 09/13/2018.     Activities of Daily Living In your present state of health, do you have any difficulty performing the following activities: 09/13/2018 08/30/2018  Hearing? N N  Comment declines hearing aids -  Vision? N N  Comment wears glasses -  Difficulty concentrating or making decisions? N N  Walking or climbing stairs? N N  Dressing or bathing? N N  Doing errands, shopping? N N  Preparing Food and eating ? N -  Using the Toilet? N -  In the past six months, have you  accidently leaked urine? N -  Do you have problems with loss of bowel control? N -  Managing your Medications? N -  Managing your Finances? N -  Some recent data might be hidden    Patient Care Team: Lada, Satira Anis, MD as PCP - General (Family Medicine) Cammie Sickle, MD as Consulting Physician (Internal Medicine) Corey Skains, MD as Consulting Physician (Cardiology)    Assessment:   This is a routine wellness examination for Theresa Andrews.  Exercise Activities and Dietary recommendations Current Exercise Habits: Home exercise routine,  Type of exercise: treadmill;walking, Time (Minutes): 30, Frequency (Times/Week): 5, Weekly Exercise (Minutes/Week): 150, Intensity: Moderate, Exercise limited by: None identified  Goals    . DIET - INCREASE WATER INTAKE     Recommend to drink at least 6-8 8oz glasses of water per day     . Weight (lb) < 170 lb (77.1 kg)     Pt states she would like to lose 10 lbs        Fall Risk Fall Risk  09/13/2018 08/30/2018 05/12/2018 10/28/2017 07/30/2017  Falls in the past year? 0 0 No No No  Number falls in past yr: 0 0 - - -  Injury with Fall? 0 0 - - -  Follow up Falls prevention discussed - - - -   FALL RISK PREVENTION PERTAINING TO THE HOME:  Any stairs in or around the home? Yes  If so, do they handrails? Yes   Home free of loose throw rugs in walkways, pet beds, electrical cords, etc? Yes  Adequate lighting in your home to reduce risk of falls? Yes   ASSISTIVE DEVICES UTILIZED TO PREVENT FALLS:  Life alert? No  Use of a cane, walker or w/c? No  Grab bars in the bathroom? Yes  Shower chair or bench in shower? No  Elevated toilet seat or a handicapped toilet? Yes   DME ORDERS:  DME order needed?  No   TIMED UP AND GO:  Was the test performed? Yes .  Length of time to ambulate 10 feet: 6 sec.   GAIT:  Appearance of gait: Gait stead-fast and without the use of an assistive device.   Education: Fall risk prevention has been  discussed.  Intervention(s) required? No   Depression Screen PHQ 2/9 Scores 09/13/2018 08/30/2018 05/12/2018 10/28/2017  PHQ - 2 Score 0 0 0 0  PHQ- 9 Score 0 0 0 -     Cognitive Function     6CIT Screen 09/13/2018 07/30/2017  What Year? 0 points 0 points  What month? 0 points 0 points  What time? 0 points 0 points  Count back from 20 0 points 0 points  Months in reverse 0 points 0 points  Repeat phrase 0 points 0 points  Total Score 0 0    Immunization History  Administered Date(s) Administered  . Influenza, High Dose Seasonal PF 04/29/2015, 05/08/2016, 04/06/2017, 05/02/2018  . Influenza-Unspecified 05/09/2014, 05/02/2018  . Pneumococcal Conjugate-13 05/09/2014  . Pneumococcal Polysaccharide-23 05/08/2016  . Zoster 07/27/2012    Qualifies for Shingles Vaccine? Yes  Zostavax completed 2014. Due for Shingrix. Education has been provided regarding the importance of this vaccine. Pt has been advised to call insurance company to determine out of pocket expense. Advised may also receive vaccine at local pharmacy or Health Dept. Verbalized acceptance and understanding.  Tdap: pt allergic to tetanus  Flu Vaccine: Up to date   Pneumococcal Vaccine: Up to date   Screening Tests Health Maintenance  Topic Date Due  . Hepatitis C Screening  10/29/2018 (Originally 12-08-1945)  . MAMMOGRAM  11/26/2018  . COLONOSCOPY  11/21/2020  . INFLUENZA VACCINE  Completed  . PNA vac Low Risk Adult  Completed  . DEXA SCAN  Discontinued  . TETANUS/TDAP  Discontinued    Cancer Screenings:  Colorectal Screening: Completed 11/22/15. Repeat every 5 years.   Mammogram: Completed 11/25/17. Repeat every year. Scheduled for 11/28/18.  Bone Density: Completed 01/07/18. Results reflect [facility]. Repeat every 2 years.   Lung Cancer Screening: (Low Dose CT Chest recommended if  Age 78-80 years, 30 pack-year currently smoking OR have quit w/in 15years.) does not qualify.   Additional Screening:  Hepatitis C  Screening: does qualify; postponed  Vision Screening: Recommended annual ophthalmology exams for early detection of glaucoma and other disorders of the eye. Is the patient up to date with their annual eye exam?  Yes  Who is the provider or what is the name of the office in which the pt attends annual eye exams? Dr. Ellin Mayhew  Dental Screening: Recommended annual dental exams for proper oral hygiene  Community Resource Referral:  CRR required this visit?  No      Plan:     I have personally reviewed and addressed the Medicare Annual Wellness questionnaire and have noted the following in the patient's chart:  A. Medical and social history B. Use of alcohol, tobacco or illicit drugs  C. Current medications and supplements D. Functional ability and status E.  Nutritional status F.  Physical activity G. Advance directives H. List of other physicians I.  Hospitalizations, surgeries, and ER visits in previous 12 months J.  [place] such as hearing and vision if needed, cognitive and depression L. Referrals and appointments   In addition, I have reviewed and discussed with patient certain preventive protocols, quality metrics, and best practice recommendations. A written personalized care plan for preventive services as well as general preventive health recommendations were provided to patient.   Signed,  [name], LPN Nurse Health Advisor   Nurse Notes:pt seen today for BP visit. AWV added on due to pt had to cancel in January. She is overall doing well and appreciative of visit.

## 2018-09-13 NOTE — Patient Instructions (Signed)
Theresa Andrews , Thank you for taking time to come for your Medicare Wellness Visit. I appreciate your ongoing commitment to your health goals. Please review the following plan we discussed and let me know if I can assist you in the future.   Screening recommendations/referrals: Colonoscopy: done 11/22/15. Repeat in 2022. Mammogram: done 11/25/17. Scheduled for 11/28/18. Bone Density: done 01/07/18. Recommended yearly ophthalmology/optometry visit for glaucoma screening and checkup Recommended yearly dental visit for hygiene and checkup  Vaccinations: Influenza vaccine: done 05/02/18 Pneumococcal vaccine: done 05/08/16 Shingles vaccine: Shingrix discussed. Please contact your pharmacy for coverage information.   Advanced directives: Advance directive discussed with you today. I have provided a copy for you to complete at home and have notarized. Once this is complete please bring a copy in to our office so we can scan it into your chart.  Conditions/risks identified: Recommend healthy eating and physical activity for desired weight loss.   Next appointment: Please follow up in one year for your Medicare Annual Wellness visit.     Preventive Care 72 Years and Older, Female Preventive care refers to lifestyle choices and visits with your health care provider that can promote health and wellness. What does preventive care include?  A yearly physical exam. This is also called an annual well check.  Dental exams once or twice a year.  Routine eye exams. Ask your health care provider how often you should have your eyes checked.  Personal lifestyle choices, including:  Daily care of your teeth and gums.  Regular physical activity.  Eating a healthy diet.  Avoiding tobacco and drug use.  Limiting alcohol use.  Practicing safe sex.  Taking low-dose aspirin every day.  Taking vitamin and mineral supplements as recommended by your health care provider. What happens during an annual well  check? The services and screenings done by your health care provider during your annual well check will depend on your age, overall health, lifestyle risk factors, and family history of disease. Counseling  Your health care provider may ask you questions about your:  Alcohol use.  Tobacco use.  Drug use.  Emotional well-being.  Home and relationship well-being.  Sexual activity.  Eating habits.  History of falls.  Memory and ability to understand (cognition).  Work and work Statistician.  Reproductive health. Screening  You may have the following tests or measurements:  Height, weight, and BMI.  Blood pressure.  Lipid and cholesterol levels. These may be checked every 5 years, or more frequently if you are over 41 years old.  Skin check.  Lung cancer screening. You may have this screening every year starting at age 38 if you have a 30-pack-year history of smoking and currently smoke or have quit within the past 15 years.  Fecal occult blood test (FOBT) of the stool. You may have this test every year starting at age 41.  Flexible sigmoidoscopy or colonoscopy. You may have a sigmoidoscopy every 5 years or a colonoscopy every 10 years starting at age 58.  Hepatitis C blood test.  Hepatitis B blood test.  Sexually transmitted disease (STD) testing.  Diabetes screening. This is done by checking your blood sugar (glucose) after you have not eaten for a while (fasting). You may have this done every 1-3 years.  Bone density scan. This is done to screen for osteoporosis. You may have this done starting at age 12.  Mammogram. This may be done every 1-2 years. Talk to your health care provider about how often you should have  regular mammograms. Talk with your health care provider about your test results, treatment options, and if necessary, the need for more tests. Vaccines  Your health care provider may recommend certain vaccines, such as:  Influenza vaccine. This is  recommended every year.  Tetanus, diphtheria, and acellular pertussis (Tdap, Td) vaccine. You may need a Td booster every 10 years.  Zoster vaccine. You may need this after age 28.  Pneumococcal 13-valent conjugate (PCV13) vaccine. One dose is recommended after age 55.  Pneumococcal polysaccharide (PPSV23) vaccine. One dose is recommended after age 57. Talk to your health care provider about which screenings and vaccines you need and how often you need them. This information is not intended to replace advice given to you by your health care provider. Make sure you discuss any questions you have with your health care provider. Document Released: 08/09/2015 Document Revised: 04/01/2016 Document Reviewed: 05/14/2015 Elsevier Interactive Patient Education  2017 [place] Prevention in the Home Falls can cause injuries. They can happen to people of all ages. There are many things you can do to make your home safe and to help prevent falls. What can I do on the outside of my home?  Regularly fix the edges of walkways and driveways and fix any cracks.  Remove anything that might make you trip as you walk through a door, such as a raised step or threshold.  Trim any bushes or trees on the path to your home.  Use bright outdoor lighting.  Clear any walking paths of anything that might make someone trip, such as rocks or tools.  Regularly check to see if handrails are loose or broken. Make sure that both sides of any steps have handrails.  Any raised decks and porches should have guardrails on the edges.  Have any leaves, snow, or ice cleared regularly.  Use sand or salt on walking paths during winter.  Clean up any spills in your garage right away. This includes oil or grease spills. What can I do in the bathroom?  Use night lights.  Install grab bars by the toilet and in the tub and shower. Do not use towel bars as grab bars.  Use non-skid mats or decals in the tub or  shower.  If you need to sit down in the shower, use a plastic, non-slip stool.  Keep the floor dry. Clean up any water that spills on the floor as soon as it happens.  Remove soap buildup in the tub or shower regularly.  Attach bath mats securely with double-sided non-slip rug tape.  Do not have throw rugs and other things on the floor that can make you trip. What can I do in the bedroom?  Use night lights.  Make sure that you have a light by your bed that is easy to reach.  Do not use any sheets or blankets that are too big for your bed. They should not hang down onto the floor.  Have a firm chair that has side arms. You can use this for support while you get dressed.  Do not have throw rugs and other things on the floor that can make you trip. What can I do in the kitchen?  Clean up any spills right away.  Avoid walking on wet floors.  Keep items that you use a lot in easy-to-reach places.  If you need to reach something above you, use a strong step stool that has a grab bar.  Keep electrical cords out of the  way.  Do not use floor polish or wax that makes floors slippery. If you must use wax, use non-skid floor wax.  Do not have throw rugs and other things on the floor that can make you trip. What can I do with my stairs?  Do not leave any items on the stairs.  Make sure that there are handrails on both sides of the stairs and use them. Fix handrails that are broken or loose. Make sure that handrails are as long as the stairways.  Check any carpeting to make sure that it is firmly attached to the stairs. Fix any carpet that is loose or worn.  Avoid having throw rugs at the top or bottom of the stairs. If you do have throw rugs, attach them to the floor with carpet tape.  Make sure that you have a light switch at the top of the stairs and the bottom of the stairs. If you do not have them, ask someone to add them for you. What else can I do to help prevent  falls?  Wear shoes that:  Do not have high heels.  Have rubber bottoms.  Are comfortable and fit you well.  Are closed at the toe. Do not wear sandals.  If you use a stepladder:  Make sure that it is fully opened. Do not climb a closed stepladder.  Make sure that both sides of the stepladder are locked into place.  Ask someone to hold it for you, if possible.  Clearly mark and make sure that you can see:  Any grab bars or handrails.  First and last steps.  Where the edge of each step is.  Use tools that help you move around (mobility aids) if they are needed. These include:  Canes.  Walkers.  Scooters.  Crutches.  Turn on the lights when you go into a dark area. Replace any light bulbs as soon as they burn out.  Set up your furniture so you have a clear path. Avoid moving your furniture around.  If any of your floors are uneven, fix them.  If there are any pets around you, be aware of where they are.  Review your medicines with your doctor. Some medicines can make you feel dizzy. This can increase your chance of falling. Ask your doctor what other things that you can do to help prevent falls. This information is not intended to replace advice given to you by your health care provider. Make sure you discuss any questions you have with your health care provider. Document Released: 05/09/2009 Document Revised: 12/19/2015 Document Reviewed: 08/17/2014 Elsevier Interactive Patient Education  2017 [employer].

## 2018-09-20 ENCOUNTER — Ambulatory Visit (INDEPENDENT_AMBULATORY_CARE_PROVIDER_SITE_OTHER): Payer: Medicare Other | Admitting: Nurse Practitioner

## 2018-09-20 ENCOUNTER — Encounter: Payer: Self-pay | Admitting: Nurse Practitioner

## 2018-09-20 VITALS — BP 156/82 | HR 57 | Temp 98.3°F | Resp 12 | Ht 64.0 in | Wt 176.9 lb

## 2018-09-20 DIAGNOSIS — I10 Essential (primary) hypertension: Secondary | ICD-10-CM

## 2018-09-20 NOTE — Progress Notes (Signed)
Name: Theresa Andrews   MRN: [id number]    DOB: November 03, 1945   Date:09/20/2018       Progress Note  Subjective  Chief Complaint  Chief Complaint  Patient presents with  . Hypertension    uncontrolled Lada wanted her seen    HPI  Patient here for 1 week follow-up on blood pressure since decreasing Metoprolol to half a pill.  Patient denies any side effects and blood pressure today is 170/88  and pulse 55 patient waited another 5 or more minutes and blood pressure went down to 156/82 and pulse 57. Patient denies headaches, chest pain, blurry vision. Has been taking the half dose of metoprolol but was out of amlodipine for 2 days- states got a phone call from the pharmacist today that it is ready for pick up.   Low- salt diet, avoids red meats. Eats a good amount of roasted vegetables, occasionally has low sodium canned veggies. Drinks only water- about 72 ounces a day. Uses treadmill 1.5 miles daily.   Patient Active Problem List   Diagnosis Date Noted  . Obesity (BMI 30-39.9) 05/12/2018  . Statin myopathy 05/12/2018  . Vitamin D deficiency 01/09/2018  . Hyperparathyroidism ([place]) 01/07/2018  . Carcinoma of overlapping sites of left breast in female, estrogen receptor positive ([place]) 03/27/2016  . Hypercalcemia 08/20/2015  . Anxiety 05/16/2015  . Diuretic-induced hypokalemia 04/29/2015  . Abnormal finding on thyroid function test 10/29/2014  . Anxiety disorder 10/29/2014  . Breast CA ([place]) 10/29/2014  . Atherosclerosis of coronary artery 10/29/2014  . Prediabetes 10/29/2014  . Gravida 1 10/29/2014  . Hyperlipidemia 10/29/2014  . Chronic cervical pain 10/29/2014  . Disorder of peripheral nervous system 10/29/2014  . Screening for depression 10/29/2014  . At risk for falling 10/29/2014  . Neoplasm of skin 10/29/2014  . Benign essential HTN 04/25/2014    Past Medical History:  Diagnosis Date  . Anxiety   . Breast cancer ([place])    pT2 pN0 cM0 (stage IIA) invasive mammary carcinoma  of left outer breast status post lumpectomy and sentinel node study on July 14, 2010  . CAD (coronary artery disease)   . Depression   . H/O hypokalemia   . History of chemotherapy   . History of MI (myocardial infarction)   . Hyperglycemia   . Hyperlipidemia   . Hypertension   . Peripheral neuropathy due to chemotherapy ([place])   . Personal history of chemotherapy 2011   left breast ca  . Personal history of radiation therapy 2001   left breast ca    Past Surgical History:  Procedure Laterality Date  . ABDOMINAL HYSTERECTOMY    . BREAST BIOPSY Left 2008   neg  . BREAST BIOPSY Left 03/05/2010   invasive mammary carcinoma  . BREAST LUMPECTOMY Left 07/14/2010   INVASIVE MAMMARY CARCINOMA WITH PROMINENT INFILTRATIVE PATTERN, negative margins  . CHOLECYSTECTOMY    . COLONOSCOPY WITH PROPOFOL N/A 11/22/2015   Procedure: COLONOSCOPY WITH PROPOFOL;  Surgeon: Hulen Luster, MD;  Location: [facility] ENDOSCOPY;  Service: Gastroenterology;  Laterality: N/A;  . CORONARY ANGIOPLASTY WITH STENT PLACEMENT  2008    Social History   Tobacco Use  . Smoking status: Former Smoker    Packs/day: 0.25    Years: 25.00    Pack years: 6.25    Types: Cigarettes    Last attempt to quit: 2008    Years since quitting: 12.1  . Smokeless tobacco: Never Used  . Tobacco comment: smoking cessation materials not required  Substance  Use Topics  . Alcohol use: No    Comment: rare; holidays     Current Outpatient Medications:  .  amLODipine (NORVASC) 10 MG tablet, Take 1 tablet (10 mg total) by mouth daily., Disp: 90 tablet, Rfl: 3 .  aspirin 81 MG EC tablet, Take 81 mg by mouth daily., Disp: , Rfl:  .  cholecalciferol (VITAMIN D) 1000 units tablet, Take 1,000 Units by mouth daily., Disp: , Rfl:  .  clindamycin-benzoyl peroxide (BENZACLIN) gel, Apply 1 application topically 2 (two) times daily. , Disp: , Rfl: 0 .  ezetimibe (ZETIA) 10 MG tablet, Take 1 tablet (10 mg total) by mouth daily., Disp: 90 tablet,  Rfl: 3 .  metoprolol succinate (TOPROL-XL) 25 MG 24 hr tablet, Take 1 tablet (25 mg total) by mouth daily. (Patient taking differently: Take 12.5 mg by mouth daily. Pt to start taking 1/2 tablet 09/13/18), Disp: 90 tablet, Rfl: 3 .  PARoxetine (PAXIL) 10 MG tablet, Take 1 tablet (10 mg total) by mouth daily., Disp: 90 tablet, Rfl: 3 .  telmisartan (MICARDIS) 80 MG tablet, TAKE 1 TABLET BY MOUTH ONCE DAILY FOR BLOOD PRESSURE (REPLACES LOSARTAN), Disp: 90 tablet, Rfl: 1 .  tretinoin (RETIN-A) 0.025 % cream, Apply topically at bedtime. , Disp: , Rfl: 4  Allergies  Allergen Reactions  . Tetanus Toxoids Shortness Of Breath and Rash  . Shellfish Allergy     ROS   No other specific complaints in a complete review of systems (except as listed in HPI above).  Objective  Vitals:   09/20/18 0958 09/20/18 1012  BP: (!) 170/88 (!) 156/82  Pulse: (!) 55 (!) 57  Resp:  12  Temp:  98.3 F (36.8 C)  TempSrc:  Oral  SpO2:  96%  Weight:  176 lb 14.4 oz (80.2 kg)  Height:  5\' 4"  (1.626 m)    Body mass index is 30.36 kg/m.  Nursing Note and Vital Signs reviewed.  Physical Exam Constitutional:      Appearance: Normal appearance. She is well-developed.  HENT:     Head: Normocephalic and atraumatic.     Right Ear: Hearing normal.     Left Ear: Hearing normal.  Eyes:     Conjunctiva/sclera: Conjunctivae normal.  Cardiovascular:     Rate and Rhythm: Normal rate and regular rhythm.     Pulses: Normal pulses.     Heart sounds: Normal heart sounds.  Pulmonary:     Effort: Pulmonary effort is normal.     Breath sounds: Normal breath sounds.  Musculoskeletal: Normal range of motion.  Neurological:     Mental Status: She is alert and oriented to person, place, and time. Mental status is at baseline.  Psychiatric:        Speech: Speech normal.        Behavior: Behavior normal. Behavior is cooperative.        Thought Content: Thought content normal.        Judgment: Judgment normal.       No results found for this or any previous visit (from the past 48 hour(s)).  Assessment & Plan  1. Benign essential HTN Patient has been out of amlodipine the past 2 days- will restart this today and check blood pressures over the next 2 weeks. Continue 1/2 tablet of metoprolol and restart amlodipine 5mg  daily. If blood pressures are over 150/90 will return in one week for follow-up and consider increasing to 10mg  of amlodipine daily. If blood pressures are under 150/90 can continue  current therapies. Patient is asymptomatic, still bradycardic but somewhat improved- do not plan to increase BB at this time.

## 2018-09-20 NOTE — Patient Instructions (Addendum)
-   Please pick up your amlodipine and take it today. - Please check your blood pressures when you are relaxed around the same time every day- if your readings are greater than 150/90 and home please call and let us know and return for a one week blood pressure follow-up in office with your blood pressure cuff from home. If your readings are below, please call or mychart and let us know  Your goal blood pressure is less than 150 mmHg on top. Try to follow the DASH guidelines (DASH stands for Dietary Approaches to Stop Hypertension) Try to limit the sodium in your diet.  Ideally, consume less than 1.5 grams (less than 1,500mg ) per day. Do not add salt when cooking or at the table.  Check the sodium amount on labels when shopping, and choose items lower in sodium when given a choice. Avoid or limit foods that already contain a lot of sodium. Eat a diet rich in fruits and vegetables and whole grains.

## 2018-09-23 ENCOUNTER — Telehealth: Payer: Self-pay

## 2018-09-23 NOTE — Telephone Encounter (Signed)
Please evaluate  Copied from [place] [id number]. Topic: Appointment Scheduling - Scheduling Inquiry for Clinic >> Sep 23, 2018 12:17 PM Margot Ables wrote: Reason for CRM: Pt called to report BP and asking for BP check appt with nurse on Monday or additional advice.  2/26 138/81 2/27 129/88 2/28 158/88  Pt reports no SOB/headache/dizziness/etc. Please advise.

## 2018-09-23 NOTE — Telephone Encounter (Deleted)
Copied from [place] [phone]. Topic: Appointment Scheduling - Scheduling Inquiry for Clinic >> Sep 23, 2018 12:17 PM Margot Ables wrote: Reason for CRM: Pt called to report BP and asking for BP check appt with nurse on Monday or additional advice.  2/26 138/81 2/27 129/88 2/28 158/88  Pt reports no SOB/headache/dizziness/etc. Please advise.

## 2018-09-23 NOTE — Telephone Encounter (Signed)
2/3 of blood pressures are at goal. Continue to take all blood pressure medications as prescribed and can come in on next week for a blood pressure check.  If patient having blood pressures greater than 180/100, or severe headaches, dizziness, chest pain, please go to ER.

## 2018-09-23 NOTE — Telephone Encounter (Signed)
Patient was informed and has been scheduled for a BP check next week.

## 2018-09-26 ENCOUNTER — Ambulatory Visit: Payer: Medicare Other

## 2018-09-26 VITALS — BP 152/80 | HR 65

## 2018-09-26 DIAGNOSIS — I10 Essential (primary) hypertension: Secondary | ICD-10-CM

## 2018-09-26 NOTE — Progress Notes (Signed)
Patient here for 1 week bp follow-up, patient has got refill and started back on amlodipine 10mg  which is a correction from previous note, also on metoprolol 12.5mg .  Patient denies any side effects and bp today first reading 152/80 pulse 65.  Second blood pressure reading is 152/76.  I consulted with Benjamine Mola and will continue current regimen since she is so close to goal.  Patient has follow up this week with Dr. Sanda Klein and we will recheck then, also advised to avoid salt and red meat.

## 2018-10-04 DIAGNOSIS — R001 Bradycardia, unspecified: Secondary | ICD-10-CM | POA: Insufficient documentation

## 2018-11-11 ENCOUNTER — Ambulatory Visit: Payer: Medicare Other | Admitting: Family Medicine

## 2018-11-21 ENCOUNTER — Encounter: Payer: Self-pay | Admitting: Family Medicine

## 2018-11-29 ENCOUNTER — Ambulatory Visit (INDEPENDENT_AMBULATORY_CARE_PROVIDER_SITE_OTHER): Payer: Medicare Other | Admitting: Nurse Practitioner

## 2018-11-29 ENCOUNTER — Encounter: Payer: Self-pay | Admitting: Nurse Practitioner

## 2018-11-29 VITALS — Ht 63.5 in | Wt 176.0 lb

## 2018-11-29 DIAGNOSIS — E559 Vitamin D deficiency, unspecified: Secondary | ICD-10-CM

## 2018-11-29 DIAGNOSIS — F411 Generalized anxiety disorder: Secondary | ICD-10-CM

## 2018-11-29 DIAGNOSIS — E782 Mixed hyperlipidemia: Secondary | ICD-10-CM

## 2018-11-29 DIAGNOSIS — I10 Essential (primary) hypertension: Secondary | ICD-10-CM | POA: Diagnosis not present

## 2018-11-29 DIAGNOSIS — Z853 Personal history of malignant neoplasm of breast: Secondary | ICD-10-CM

## 2018-11-29 DIAGNOSIS — E213 Hyperparathyroidism, unspecified: Secondary | ICD-10-CM | POA: Diagnosis not present

## 2018-11-29 MED ORDER — AMLODIPINE BESYLATE 10 MG PO TABS: 10.0000 mg | ORAL_TABLET | Freq: Every day | ORAL | 0 refills | Status: DC

## 2018-11-29 MED ORDER — METOPROLOL SUCCINATE ER 25 MG PO TB24: 12.5000 mg | ORAL_TABLET | Freq: Every day | ORAL | 0 refills | Status: DC

## 2018-11-29 MED ORDER — TELMISARTAN 80 MG PO TABS: ORAL_TABLET | ORAL | 0 refills | Status: DC

## 2018-11-29 MED ORDER — PAROXETINE HCL 10 MG PO TABS: 10.0000 mg | ORAL_TABLET | Freq: Every day | ORAL | 3 refills | Status: DC

## 2018-11-29 NOTE — Progress Notes (Signed)
Virtual Visit via Telephone Note  I connected with Theresa Andrews on 11/29/18 at 10:00 AM EDT by telephone and verified that I am speaking with the correct person using two identifiers.   Staff discussed the limitations, risks, security and privacy concerns of performing an evaluation and management service by telephone and the availability of in person appointments. Staff also discussed with the patient that there may be a patient responsible charge related to this service. The patient expressed understanding and agreed to proceed.  Patients location: home My location: home office  Other people in meeting: none    HPI  Hypertension rx amlodipine 10mg , metoprolol 12.5mg , telmisartan 80mg  daily States it usually is in the 130's/70's Denies headaches, blurry vision, chest pain  BP Readings from Last 3 Encounters:  09/26/18 (!) 152/80  09/20/18 (!) 156/82  09/13/18 138/84   Hyperlipidemia rx crestor 5mg  daily; sees cardioloigist Dr. Nehemiah Massed  Lab Results  Component Value Date   CHOL 192 08/12/2018   HDL 57 08/12/2018   LDLCALC 118 (H) 08/12/2018   TRIG 78 08/12/2018   CHOLHDL 3.4 08/12/2018   Vitamin D deficiency Takes vitamin D 1000IU daily  Anxiety Takes paxil 10mg  occasionally- states 3 or 4 times a day, states it doesn't not make her sick taking it like this. Says she can tell a difference when she doesn't take it.   History Breast Cancer Sees Dr. Rogue Bussing every 6 years. Had breast cancer in 2011 had lumpectomy and chemo; due pandemic appointment was pushed back till July.   PHQ2/9: Depression screen [facility] 2/9 11/29/2018 09/13/2018 08/30/2018 05/12/2018 10/28/2017  Decreased Interest 0 0 0 0 0  Down, Depressed, Hopeless 0 0 0 0 0  PHQ - 2 Score 0 0 0 0 0  Altered sleeping - 0 0 0 -  Tired, decreased energy - 0 0 0 -  Change in appetite - 0 0 0 -  Feeling bad or failure about yourself  - 0 0 0 -  Trouble concentrating - 0 0 0 -  Moving slowly or fidgety/restless - 0 0 0 -   Suicidal thoughts - 0 0 0 -  PHQ-9 Score - 0 0 0 -  Difficult doing work/chores - Not difficult at all Not difficult at all Not difficult at all -     PHQ reviewed. Negative   Patient Active Problem List   Diagnosis Date Noted  . Obesity (BMI 30-39.9) 05/12/2018  . Statin myopathy 05/12/2018  . Vitamin D deficiency 01/09/2018  . Hyperparathyroidism ([place]) 01/07/2018  . Carcinoma of overlapping sites of left breast in female, estrogen receptor positive ([place]) 03/27/2016  . Hypercalcemia 08/20/2015  . Anxiety 05/16/2015  . Diuretic-induced hypokalemia 04/29/2015  . Abnormal finding on thyroid function test 10/29/2014  . Anxiety disorder 10/29/2014  . Breast CA ([place]) 10/29/2014  . Atherosclerosis of coronary artery 10/29/2014  . Prediabetes 10/29/2014  . Gravida 1 10/29/2014  . Hyperlipidemia 10/29/2014  . Chronic cervical pain 10/29/2014  . Disorder of peripheral nervous system 10/29/2014  . Screening for depression 10/29/2014  . At risk for falling 10/29/2014  . Neoplasm of skin 10/29/2014  . Benign essential HTN 04/25/2014    Past Medical History:  Diagnosis Date  . Anxiety   . Breast cancer ([place])    pT2 pN0 cM0 (stage IIA) invasive mammary carcinoma of left outer breast status post lumpectomy and sentinel node study on July 14, 2010  . CAD (coronary artery disease)   . Depression   . H/O hypokalemia   .  History of chemotherapy   . History of MI (myocardial infarction)   . Hyperglycemia   . Hyperlipidemia   . Hypertension   . Peripheral neuropathy due to chemotherapy ([place])   . Personal history of chemotherapy 2011   left breast ca  . Personal history of radiation therapy 2001   left breast ca    Past Surgical History:  Procedure Laterality Date  . ABDOMINAL HYSTERECTOMY    . BREAST BIOPSY Left 2008   neg  . BREAST BIOPSY Left 03/05/2010   invasive mammary carcinoma  . BREAST LUMPECTOMY Left 07/14/2010   INVASIVE MAMMARY CARCINOMA WITH PROMINENT  INFILTRATIVE PATTERN, negative margins  . CHOLECYSTECTOMY    . COLONOSCOPY WITH PROPOFOL N/A 11/22/2015   Procedure: COLONOSCOPY WITH PROPOFOL;  Surgeon: Hulen Luster, MD;  Location: [facility] ENDOSCOPY;  Service: Gastroenterology;  Laterality: N/A;  . CORONARY ANGIOPLASTY WITH STENT PLACEMENT  2008    Social History   Tobacco Use  . Smoking status: Former Smoker    Packs/day: 0.25    Years: 25.00    Pack years: 6.25    Types: Cigarettes    Last attempt to quit: 2008    Years since quitting: 12.3  . Smokeless tobacco: Never Used  . Tobacco comment: smoking cessation materials not required  Substance Use Topics  . Alcohol use: No    Comment: rare; holidays     Current Outpatient Medications:  .  amLODipine (NORVASC) 10 MG tablet, Take 1 tablet (10 mg total) by mouth daily., Disp: 90 tablet, Rfl: 3 .  aspirin 81 MG EC tablet, Take 81 mg by mouth daily., Disp: , Rfl:  .  cholecalciferol (VITAMIN D) 1000 units tablet, Take 1,000 Units by mouth daily., Disp: , Rfl:  .  clindamycin-benzoyl peroxide (BENZACLIN) gel, Apply 1 application topically 2 (two) times daily. , Disp: , Rfl: 0 .  metoprolol succinate (TOPROL-XL) 25 MG 24 hr tablet, Take 1 tablet (25 mg total) by mouth daily. (Patient taking differently: Take 12.5 mg by mouth daily. Pt to start taking 1/2 tablet 09/13/18), Disp: 90 tablet, Rfl: 3 .  PARoxetine (PAXIL) 10 MG tablet, Take 1 tablet (10 mg total) by mouth daily., Disp: 90 tablet, Rfl: 3 .  rosuvastatin (CRESTOR) 5 MG tablet, Take 5 mg by mouth daily., Disp: , Rfl:  .  telmisartan (MICARDIS) 80 MG tablet, TAKE 1 TABLET BY MOUTH ONCE DAILY FOR BLOOD PRESSURE (REPLACES LOSARTAN), Disp: 90 tablet, Rfl: 1 .  tretinoin (RETIN-A) 0.025 % cream, Apply topically at bedtime. , Disp: , Rfl: 4  Allergies  Allergen Reactions  . Tetanus Toxoids Shortness Of Breath and Rash  . Shellfish Allergy     Review of Systems  Constitutional: Negative for chills, fever and malaise/fatigue.   Respiratory: Negative for cough and shortness of breath.   Cardiovascular: Negative for chest pain, palpitations and leg swelling.  Gastrointestinal: Negative for abdominal pain.  Musculoskeletal: Negative for joint pain and myalgias.  Skin: Negative for rash.  Neurological: Negative for dizziness and headaches.  Psychiatric/Behavioral: The patient does not have insomnia.     No other specific complaints in a complete review of systems (except as listed in HPI above).  Objective  Vitals:   11/29/18 0943  Height: 5' 3.5" (1.613 m)     Body mass index is 30.84 kg/m.  Patient is alert, able to speak in full sentences without difficulty.   Assessment & Plan  1. Essential hypertension Stable per self-report - telmisartan (MICARDIS) 80 MG tablet; TAKE  1 TABLET BY MOUTH ONCE DAILY FOR BLOOD PRESSURE  Dispense: 90 tablet; Refill: 0 - amLODipine (NORVASC) 10 MG tablet; Take 1 tablet (10 mg total) by mouth daily.  Dispense: 90 tablet; Refill: 0 - metoprolol succinate (TOPROL-XL) 25 MG 24 hr tablet; Take 0.5 tablets (12.5 mg total) by mouth daily.  Dispense: 45 tablet; Refill: 0  2. Hyperparathyroidism ([place]) Follows up with endo  3. Generalized anxiety disorder Recommend taking paxil daily, offered PRn med if unable to remember states she can remember and wants to stay on it.   4. Mixed hyperlipidemia Continue cards follow-up and statin   5. Vitamin D deficiency Continue supplementation   6. History of breast cancer Continue routine follow up with oncology     I discussed the assessment and treatment plan with the patient. The patient was provided an opportunity to ask questions and all were answered. The patient agreed with the plan and demonstrated an understanding of the instructions.   The patient was advised to call back or seek an in-person evaluation if the symptoms worsen or if the condition fails to improve as anticipated.  I provided 21 minutes of non-face-to-face  time during this encounter.   Fredderick Severance, NP

## 2018-11-30 ENCOUNTER — Inpatient Hospital Stay: Payer: Medicare Other | Admitting: Internal Medicine

## 2018-11-30 ENCOUNTER — Inpatient Hospital Stay: Payer: Medicare Other

## 2019-01-26 ENCOUNTER — Other Ambulatory Visit: Payer: Self-pay

## 2019-01-26 ENCOUNTER — Ambulatory Visit
Admission: RE | Admit: 2019-01-26 | Discharge: 2019-01-26 | Disposition: A | Discharge status: Home / Self Care | Payer: Medicare Other | Source: Ambulatory Visit | Attending: Internal Medicine | Admitting: Internal Medicine

## 2019-01-26 DIAGNOSIS — Z170 Estrogen receptor positive status [ER+]: Secondary | ICD-10-CM

## 2019-01-26 DIAGNOSIS — Z1231 Encounter for screening mammogram for malignant neoplasm of breast: Secondary | ICD-10-CM | POA: Diagnosis present

## 2019-01-26 DIAGNOSIS — Z853 Personal history of malignant neoplasm of breast: Secondary | ICD-10-CM | POA: Insufficient documentation

## 2019-01-26 DIAGNOSIS — C50812 Malignant neoplasm of overlapping sites of left female breast: Secondary | ICD-10-CM

## 2019-01-30 ENCOUNTER — Other Ambulatory Visit: Payer: Self-pay

## 2019-01-30 ENCOUNTER — Other Ambulatory Visit: Payer: Self-pay | Admitting: *Deleted

## 2019-01-30 ENCOUNTER — Inpatient Hospital Stay (HOSPITAL_BASED_OUTPATIENT_CLINIC_OR_DEPARTMENT_OTHER): Payer: Medicare Other | Admitting: Internal Medicine

## 2019-01-30 ENCOUNTER — Inpatient Hospital Stay: Payer: Medicare Other | Attending: Internal Medicine

## 2019-01-30 ENCOUNTER — Encounter: Payer: Self-pay | Admitting: Internal Medicine

## 2019-01-30 DIAGNOSIS — Z853 Personal history of malignant neoplasm of breast: Secondary | ICD-10-CM

## 2019-01-30 DIAGNOSIS — C50812 Malignant neoplasm of overlapping sites of left female breast: Secondary | ICD-10-CM

## 2019-01-30 DIAGNOSIS — Z170 Estrogen receptor positive status [ER+]: Secondary | ICD-10-CM

## 2019-01-30 LAB — COMPREHENSIVE METABOLIC PANEL
ALT: 20 U/L (ref 0–44)
AST: 19 U/L (ref 15–41)
Albumin: 4.3 g/dL (ref 3.5–5.0)
Alkaline Phosphatase: 99 U/L (ref 38–126)
Anion gap: 10 (ref 5–15)
BUN: 16 mg/dL (ref 8–23)
CO2: 23 mmol/L (ref 22–32)
Calcium: 10.2 mg/dL (ref 8.9–10.3)
Chloride: 105 mmol/L (ref 98–111)
Creatinine, Ser: 0.67 mg/dL (ref 0.44–1.00)
GFR calc Af Amer: >60 mL/min (ref >60)
GFR calc non Af Amer: >60 mL/min (ref >60)
Glucose, Bld: 140 mg/dL — ABNORMAL HIGH (ref 70–99)
Potassium: 3.8 mmol/L (ref 3.5–5.1)
Sodium: 138 mmol/L (ref 135–145)
Total Bilirubin: 0.6 mg/dL (ref 0.3–1.2)
Total Protein: 7.9 g/dL (ref 6.5–8.1)

## 2019-01-30 LAB — CBC WITH DIFFERENTIAL/PLATELET
Abs Immature Granulocytes: 0.02 10*3/uL (ref 0.00–0.07)
Basophils Absolute: 0.1 10*3/uL (ref 0.0–0.1)
Basophils Relative: 1 %
Eosinophils Absolute: 0.4 10*3/uL (ref 0.0–0.5)
Eosinophils Relative: 7 %
HCT: 46.3 % — ABNORMAL HIGH (ref 36.0–46.0)
Hemoglobin: 14.9 g/dL (ref 12.0–15.0)
Immature Granulocytes: 0 %
Lymphocytes Relative: 39 %
Lymphs Abs: 2.4 10*3/uL (ref 0.7–4.0)
MCH: 30.1 pg (ref 26.0–34.0)
MCHC: 32.2 g/dL (ref 30.0–36.0)
MCV: 93.5 fL (ref 80.0–100.0)
Monocytes Absolute: 0.6 10*3/uL (ref 0.1–1.0)
Monocytes Relative: 9 %
Neutro Abs: 2.7 10*3/uL (ref 1.7–7.7)
Neutrophils Relative %: 44 %
Platelets: 222 10*3/uL (ref 150–400)
RBC: 4.95 MIL/uL (ref 3.87–5.11)
RDW: 13.8 % (ref 11.5–15.5)
WBC: 6.1 10*3/uL (ref 4.0–10.5)
nRBC: 0 % (ref 0.0–0.2)

## 2019-01-30 NOTE — Assessment & Plan Note (Signed)
#  Left breast cancer stage II ER PR positive HER-2 negative; finished 5 years in May 2017.  #Clinically no evidence of recurrence.  July 2020- mammogram- wnl.   #Intermittent hypercalcemia- chronic [since 2016]; primary PTH; off HCTZ; ca- 10.2. improved   # PN- G-1 on essential oils.  Stable  # DISPOSITION:  #  follow up in 12 months- MD; labs-cbc/cmp; ca-27-29;  Theresa Andrews screening 2020 prior- Dr.B  # cc; Dr.Lada.

## 2019-01-30 NOTE — Progress Notes (Signed)
I connected with Theresa Andrews on 01/30/19 at  1:00 PM EDT by telephone visit and verified that I am speaking with the correct person using two identifiers.  I discussed the limitations, risks, security and privacy concerns of performing an evaluation and management service by telemedicine and the availability of in-person appointments. I also discussed with the patient that there may be a patient responsible charge related to this service. The patient expressed understanding and agreed to proceed.    Other persons participating in the visit and their role in the encounter: RN/medical reconciliation Patient's location: Home Provider's location: Office  Oncology History Overview Note  # AUG 2011- LEFT BREAST CA Stage II ER/PR- Pos; her 2 NEG; s/p neo-adj ACx4- poor response; s/p lumpec [Theresa.Arru]- ypT2 [2.5cm]ypsN-0/2; Taxol weekly 9/12 [sec to PN]; finished march 2012. Arimidex-May 2012; Aug 2013- changed to Aug 2013; sep 2013- Started Tam; Mammo- in April 2017.   # Declines extended anti-hormone therapy.    # Feb 2017- Bone scan- Negative [hypercalcemia] ; mammogram May 2018-wnl.   # Hypercalcemia- chronic [2016]; primary hyperparathyrdoisim; improved after stopping HCTZ.    # PN- G-1  DIAGNOSIS: breast cancer  STAGE:  II       ;GOALS: cure  CURRENT/MOST RECENT THERAPY: surviellance    Breast CA ([place])  10/29/2014 Initial Diagnosis   Breast CA ([facility])   Carcinoma of overlapping sites of left breast in female, estrogen receptor positive ([place])     Chief Complaint: Breast cancer   History of present illness:Theresa Andrews 73 y.o.  female with history of stage II ER PR positive HER-2 negative breast cancer is here for follow-up.  Patient is currently on surveillance.  Patient denies any unusual aches and pains.  Denies any nausea vomiting.  No headaches.  No chest pain or shortness breath or cough.  Observation/objective: Labs normal limits.  Assessment and plan: Carcinoma of  overlapping sites of left breast in female, estrogen receptor positive ([place]) # Left breast cancer stage II ER PR positive HER-2 negative; finished 5 years in May 2017.  #Clinically no evidence of recurrence.  July 2020- mammogram- wnl.   #Intermittent hypercalcemia- chronic [since 2016]; primary PTH; off HCTZ; ca- 10.2. improved   # PN- G-1 on essential oils.  Stable  # DISPOSITION:  #  follow up in 12 months- MD; labs-cbc/cmp; ca-27-29;  Theresa Andrews screening 2020 prior- Theresa.Andrews  # cc; Theresa Andrews.   Follow-up instructions:  I discussed the assessment and treatment plan with the patient.  The patient was provided an opportunity to ask questions and all were answered.  The patient agreed with the plan and demonstrated understanding of instructions.  The patient was advised to call back or seek an in person evaluation if the symptoms worsen or if the condition fails to improve as anticipated.  I provided 12  minutes of non face-to-face telephone visit time during this encounter, and > 50% was spent counseling as documented under my assessment & plan.   Theresa. Charlaine Dalton Andrews at [facility] 01/30/2019 1:11 PM

## 2019-01-31 LAB — CANCER ANTIGEN 27.29: CA 27.29: 17.4 U/mL (ref 0.0–38.6)

## 2019-03-28 ENCOUNTER — Other Ambulatory Visit: Payer: Self-pay | Admitting: Nurse Practitioner

## 2019-03-28 DIAGNOSIS — I10 Essential (primary) hypertension: Secondary | ICD-10-CM

## 2019-04-05 ENCOUNTER — Other Ambulatory Visit: Payer: Self-pay

## 2019-04-05 DIAGNOSIS — I10 Essential (primary) hypertension: Secondary | ICD-10-CM

## 2019-04-05 MED ORDER — TELMISARTAN 80 MG PO TABS: ORAL_TABLET | ORAL | 0 refills | Status: DC

## 2019-04-05 NOTE — Telephone Encounter (Signed)
Please schedule patient for follow up in the next 30 days.  

## 2019-04-06 NOTE — Telephone Encounter (Signed)
lvm informing pt that prescription have been sent to pharmacy and for pt to schedule appt in 30 days

## 2019-05-17 ENCOUNTER — Ambulatory Visit (INDEPENDENT_AMBULATORY_CARE_PROVIDER_SITE_OTHER): Payer: Medicare Other | Admitting: Family Medicine

## 2019-05-17 ENCOUNTER — Other Ambulatory Visit: Payer: Self-pay

## 2019-05-17 ENCOUNTER — Encounter: Payer: Self-pay | Admitting: Family Medicine

## 2019-05-17 DIAGNOSIS — E782 Mixed hyperlipidemia: Secondary | ICD-10-CM

## 2019-05-17 DIAGNOSIS — E559 Vitamin D deficiency, unspecified: Secondary | ICD-10-CM

## 2019-05-17 DIAGNOSIS — I2510 Atherosclerotic heart disease of native coronary artery without angina pectoris: Secondary | ICD-10-CM | POA: Diagnosis not present

## 2019-05-17 DIAGNOSIS — Z170 Estrogen receptor positive status [ER+]: Secondary | ICD-10-CM

## 2019-05-17 DIAGNOSIS — E669 Obesity, unspecified: Secondary | ICD-10-CM

## 2019-05-17 DIAGNOSIS — F411 Generalized anxiety disorder: Secondary | ICD-10-CM

## 2019-05-17 DIAGNOSIS — I10 Essential (primary) hypertension: Secondary | ICD-10-CM

## 2019-05-17 DIAGNOSIS — R7303 Prediabetes: Secondary | ICD-10-CM

## 2019-05-17 DIAGNOSIS — C50812 Malignant neoplasm of overlapping sites of left female breast: Secondary | ICD-10-CM

## 2019-05-17 MED ORDER — PAROXETINE HCL 10 MG PO TABS: 10.0000 mg | ORAL_TABLET | Freq: Every day | ORAL | 3 refills | Status: DC

## 2019-05-17 MED ORDER — METOPROLOL SUCCINATE ER 25 MG PO TB24: 12.5000 mg | ORAL_TABLET | Freq: Every day | ORAL | 3 refills | Status: DC

## 2019-05-17 NOTE — Progress Notes (Signed)
Name: Theresa Andrews   MRN: [id number]    DOB: Dec 05, 1945   Date:05/17/2019       Progress Note  Subjective  Chief Complaint  Chief Complaint  Patient presents with  . Hypertension    medication refill  . Depression  . Hyperlipidemia    I connected with  Theresa Andrews  on 05/17/19 at 10:00 AM EDT by a video enabled telemedicine application and verified that I am speaking with the correct person using two identifiers.  I discussed the limitations of evaluation and management by telemedicine and the availability of in person appointments. The patient expressed understanding and agreed to proceed. Staff also discussed with the patient that there may be a patient responsible charge related to this service. Patient Location: Home Provider Location: Home Office Additional Individuals present: None  HPI  Hypertension rx amlodipine 10mg , metoprolol 12.5mg , telmisartan 80mg  daily States it usually is in the 130's/70's; has not had ability to check her BP's in a week because she needs battery replacement. Denies chest pain, shortness of breath, headaches, vision changes, BLE edema. BP Readings from Last 3 Encounters:  09/26/18 (!) 152/80  09/20/18 (!) 156/82  09/13/18 138/84    Hyperlipidemia: rx crestor 5mg  daily; sees cardioloigist Dr. Nehemiah Massed - needs to schedule follow up with stress testing which she is going to do today.  Denies chest pain, shortness of breath, or myalgias.  Vitamin D deficiency Takes vitamin D 1000IU daily; energy levels have been good lately.  Due for repeat labs.   Anxiety Takes paxil 10mg  occasionally.  Says she can tell a difference when she doesn't take it.  She feels her mood has been stable, keeping up with friends and family. Daughter lives next door.   History Breast Cancer Sees Dr. Rogue Bussing once yearly. Had breast cancer in 2011 had lumpectomy and chemo and radiation.  Yearly mammograms from now on.    History Hypercalcemia: She was taking a  diuretic at the time, came off of the diuretic and the calcium levels normalized.  She states no additional follow up for concern over hyperparathyroidism.  Normal in July at last check.  Prediabetes/Obesity: Denies polyuria, polydipsia, or polyphagia.  Cut out sweets and a lot of starches. Walks on her treadmill several times a week.   Patient Active Problem List   Diagnosis Date Noted  . Obesity (BMI 30-39.9) 05/12/2018  . Statin myopathy 05/12/2018  . Vitamin D deficiency 01/09/2018  . Hyperparathyroidism ([place]) 01/07/2018  . Carcinoma of overlapping sites of left breast in female, estrogen receptor positive ([place]) 03/27/2016  . Hypercalcemia 08/20/2015  . Anxiety 05/16/2015  . Diuretic-induced hypokalemia 04/29/2015  . Abnormal finding on thyroid function test 10/29/2014  . Anxiety disorder 10/29/2014  . Breast CA ([place]) 10/29/2014  . Atherosclerosis of coronary artery 10/29/2014  . Prediabetes 10/29/2014  . Gravida 1 10/29/2014  . Hyperlipidemia 10/29/2014  . Chronic cervical pain 10/29/2014  . Disorder of peripheral nervous system 10/29/2014  . Screening for depression 10/29/2014  . At risk for falling 10/29/2014  . Neoplasm of skin 10/29/2014  . Benign essential HTN 04/25/2014    Past Surgical History:  Procedure Laterality Date  . ABDOMINAL HYSTERECTOMY    . BREAST BIOPSY Left 2008   neg  . BREAST BIOPSY Left 03/05/2010   invasive mammary carcinoma  . BREAST LUMPECTOMY Left 07/14/2010   INVASIVE MAMMARY CARCINOMA WITH PROMINENT INFILTRATIVE PATTERN, negative margins  . CHOLECYSTECTOMY    . COLONOSCOPY WITH PROPOFOL N/A 11/22/2015  Procedure: COLONOSCOPY WITH PROPOFOL;  Surgeon: Hulen Luster, MD;  Location[facility] ENDOSCOPY;  Service: Gastroenterology;  Laterality: N/A;  . CORONARY ANGIOPLASTY WITH STENT PLACEMENT  2008    Family History  Problem Relation Age of Onset  . Breast cancer Sister 52  . Hypertension Mother   . Aneurysm Mother   . Heart attack Sister   .  Birth defects Sister     Social History   Socioeconomic History  . Marital status: Divorced    Spouse name: Not on file  . Number of children: 3  . Years of education: some college, no degree  . Highest education level: 12th grade  Occupational History    Employer: RETIRED  Social Needs  . Financial resource strain: Not hard at all  . Food insecurity    Worry: Never true    Inability: Never true  . Transportation needs    Medical: No    Non-medical: No  Tobacco Use  . Smoking status: Former Smoker    Packs/day: 0.25    Years: 25.00    Pack years: 6.25    Types: Cigarettes    Quit date: 2008    Years since quitting: 12.8  . Smokeless tobacco: Never Used  . Tobacco comment: smoking cessation materials not required  Substance and Sexual Activity  . Alcohol use: No    Comment: rare; holidays  . Drug use: No  . Sexual activity: Not Currently  Lifestyle  . Physical activity    Days per week: 5 days    Minutes per session: 30 min  . Stress: Not at all  Relationships  . Social connections    Talks on phone: More than three times a week    Gets together: Three times a week    Attends religious service: Never    Active member of club or organization: No    Attends meetings of clubs or organizations: Never    Relationship status: Divorced  . Intimate partner violence    Fear of current or ex partner: No    Emotionally abused: No    Physically abused: No    Forced sexual activity: No  Other Topics Concern  . Not on file  Social History Narrative  . Not on file     Current Outpatient Medications:  .  amLODipine (NORVASC) 10 MG tablet, Take 1 tablet by mouth once daily, Disp: 90 tablet, Rfl: 0 .  aspirin 81 MG EC tablet, Take 81 mg by mouth daily., Disp: , Rfl:  .  cholecalciferol (VITAMIN D) 1000 units tablet, Take 1,000 Units by mouth daily., Disp: , Rfl:  .  clindamycin-benzoyl peroxide (BENZACLIN) gel, Apply 1 application topically 2 (two) times daily. , Disp: ,  Rfl: 0 .  metoprolol succinate (TOPROL-XL) 25 MG 24 hr tablet, Take 0.5 tablets (12.5 mg total) by mouth daily., Disp: 45 tablet, Rfl: 0 .  PARoxetine (PAXIL) 10 MG tablet, Take 1 tablet (10 mg total) by mouth daily., Disp: 90 tablet, Rfl: 3 .  rosuvastatin (CRESTOR) 5 MG tablet, Take 5 mg by mouth daily., Disp: , Rfl:  .  telmisartan (MICARDIS) 80 MG tablet, TAKE 1 TABLET BY MOUTH ONCE DAILY FOR BLOOD PRESSURE, Disp: 90 tablet, Rfl: 0 .  tretinoin (RETIN-A) 0.025 % cream, Apply topically at bedtime. , Disp: , Rfl: 4  Allergies  Allergen Reactions  . Tetanus Toxoids Shortness Of Breath and Rash  . Shellfish Allergy     I personally reviewed active problem list, medication list,  allergies, notes from last encounter, lab results with the patient/caregiver today.   ROS  Constitutional: Negative for fever or weight change.  Respiratory: Negative for cough and shortness of breath.   Cardiovascular: Negative for chest pain or palpitations.  Gastrointestinal: Negative for abdominal pain, no bowel changes.  Musculoskeletal: Negative for gait problem or joint swelling.  Skin: Negative for rash.  Neurological: Negative for dizziness or headache.  No other specific complaints in a complete review of systems (except as listed in HPI above).  Objective  Virtual encounter, vitals not obtained.  There is no height or weight on file to calculate BMI.  Physical Exam  Constitutional: Patient appears well-developed and well-nourished. No distress.  HENT: Head: Normocephalic and atraumatic.  Neck: Normal range of motion. Pulmonary/Chest: Effort normal. No respiratory distress. Speaking in complete sentences Neurological: Pt is alert and oriented to person, place, and time. Coordination, speech and gait are normal.  Psychiatric: Patient has a normal mood and affect. behavior is normal. Judgment and thought content normal.  No results found for this or any previous visit (from the past 72  hour(s)).  PHQ2/9: Depression screen [facility] 2/9 05/17/2019 11/29/2018 09/13/2018 08/30/2018 05/12/2018  Decreased Interest 0 0 0 0 0  Down, Depressed, Hopeless 0 0 0 0 0  PHQ - 2 Score 0 0 0 0 0  Altered sleeping 0 - 0 0 0  Tired, decreased energy 0 - 0 0 0  Change in appetite 0 - 0 0 0  Feeling bad or failure about yourself  0 - 0 0 0  Trouble concentrating 0 - 0 0 0  Moving slowly or fidgety/restless 0 - 0 0 0  Suicidal thoughts 0 - 0 0 0  PHQ-9 Score 0 - 0 0 0  Difficult doing work/chores Not difficult at all - Not difficult at all Not difficult at all Not difficult at all   PHQ-2/9 Result is negative.    Fall Risk: Fall Risk  05/17/2019 11/29/2018 09/13/2018 08/30/2018 05/12/2018  Falls in the past year? 0 0 0 0 No  Number falls in past yr: 0 0 0 0 -  Injury with Fall? 0 0 0 0 -  Follow up Falls evaluation completed - Falls prevention discussed - -    Assessment & Plan  1. Benign essential HTN - Stable per home BP report. Continue current regimen - metoprolol succinate (TOPROL-XL) 25 MG 24 hr tablet; Take 0.5 tablets (12.5 mg total) by mouth daily.  Dispense: 45 tablet; Refill: 3  2. Mixed hyperlipidemia - Continue low dose statin - tolerating well without myalgias. - Lipid panel  3. Atherosclerosis of native coronary artery of native heart without angina pectoris - Needs to follow up with D.r Nehemiah Massed to schedule stress test now that COVID-19 pandemic restrictions will allow. - Lipid panel  4. Vitamin D deficiency - VITAMIN D 25 Hydroxy (Vit-D Deficiency, Fractures)  5. Generalized anxiety disorder - PARoxetine (PAXIL) 10 MG tablet; Take 1 tablet (10 mg total) by mouth daily.  Dispense: 90 tablet; Refill: 3  6. Carcinoma of overlapping sites of left breast in female, estrogen receptor positive ([place]) - She has finished her Q71mo follow up and is not on Q59mo follow up with Dr. Tish Men.  Negative mammogram in July 2020.  7. Hypercalcemia - Normal calcium level in July  2020.  Will monitor about every 6 months or so.  8. Obesity (BMI 30-39.9) - Discussed importance of 150 minutes of physical activity weekly, eat two servings of fish weekly,  eat one serving of tree nuts ( cashews, pistachios, pecans, almonds.Marland Kitchen) every other day, eat 6 servings of fruit/vegetables daily and drink plenty of water and avoid sweet beverages.   9. Prediabetes - Diet controlled only - Hemoglobin A1c   I discussed the assessment and treatment plan with the patient. The patient was provided an opportunity to ask questions and all were answered. The patient agreed with the plan and demonstrated an understanding of the instructions.  The patient was advised to call back or seek an in-person evaluation if the symptoms worsen or if the condition fails to improve as anticipated.  I provided 20 minutes of non-face-to-face time during this encounter.

## 2019-07-04 ENCOUNTER — Other Ambulatory Visit: Payer: Self-pay | Admitting: Family Medicine

## 2019-07-04 DIAGNOSIS — I10 Essential (primary) hypertension: Secondary | ICD-10-CM

## 2019-07-05 ENCOUNTER — Other Ambulatory Visit: Payer: Self-pay

## 2019-07-05 DIAGNOSIS — I10 Essential (primary) hypertension: Secondary | ICD-10-CM

## 2019-07-05 MED ORDER — AMLODIPINE BESYLATE 10 MG PO TABS: 10.0000 mg | ORAL_TABLET | Freq: Every day | ORAL | 1 refills | Status: DC

## 2019-07-06 ENCOUNTER — Encounter: Payer: Self-pay | Admitting: Family Medicine

## 2019-07-06 LAB — LIPID PANEL
Cholesterol: 146 mg/dL (ref <200)
HDL: 50 mg/dL (ref >=50)
LDL Cholesterol (Calc): 81 mg/dL (calc)
Non-HDL Cholesterol (Calc): 96 mg/dL (calc) (ref <130)
Total CHOL/HDL Ratio: 2.9 (calc) (ref <5.0)
Triglycerides: 66 mg/dL (ref <150)

## 2019-07-06 LAB — HEMOGLOBIN A1C
Hgb A1c MFr Bld: 6.3 % of total Hgb — ABNORMAL HIGH (ref <5.7)
Mean Plasma Glucose: 134 (calc)
eAG (mmol/L): 7.4 (calc)

## 2019-07-06 LAB — VITAMIN D 25 HYDROXY (VIT D DEFICIENCY, FRACTURES): Vit D, 25-Hydroxy: 19 ng/mL — ABNORMAL LOW (ref 30–100)

## 2019-09-19 ENCOUNTER — Ambulatory Visit (INDEPENDENT_AMBULATORY_CARE_PROVIDER_SITE_OTHER): Payer: Self-pay

## 2019-09-19 VITALS — BP 129/69 | HR 76 | Ht 64.0 in | Wt 178.0 lb

## 2019-09-19 DIAGNOSIS — Z0000 Encounter for general adult medical examination without abnormal findings: Secondary | ICD-10-CM

## 2019-09-19 NOTE — Patient Instructions (Signed)
Theresa Andrews , Thank you for taking time to come for your Medicare Wellness Visit. I appreciate your ongoing commitment to your health goals. Please review the following plan we discussed and let me know if I can assist you in the future.   Screening recommendations/referrals: Colonoscopy: done 11/22/15. Repeat in 2022.  Mammogram: done 01/26/19. Scheduled for 01/30/20.  Bone Density: done 01/07/18 Recommended yearly ophthalmology/optometry visit for glaucoma screening and checkup Recommended yearly dental visit for hygiene and checkup  Vaccinations: Influenza vaccine: done 04/14/19 Pneumococcal vaccine: done 05/08/16 Shingles vaccine: Shingrix discussed. Please contact your pharmacy for coverage information.   Advanced directives: Please bring a copy of your health care power of attorney and living will to the office at your convenience once you have completed those documents.   Conditions/risks identified: Keep up the great work!  Next appointment: Please follow up in one year for your Medicare Annual Wellness visit.     Preventive Care 22 Years and Older, Female Preventive care refers to lifestyle choices and visits with your health care provider that can promote health and wellness. What does preventive care include?  A yearly physical exam. This is also called an annual well check.  Dental exams once or twice a year.  Routine eye exams. Ask your health care provider how often you should have your eyes checked.  Personal lifestyle choices, including:  Daily care of your teeth and gums.  Regular physical activity.  Eating a healthy diet.  Avoiding tobacco and drug use.  Limiting alcohol use.  Practicing safe sex.  Taking low-dose aspirin every day.  Taking vitamin and mineral supplements as recommended by your health care provider. What happens during an annual well check? The services and screenings done by your health care provider during your annual well check will  depend on your age, overall health, lifestyle risk factors, and family history of disease. Counseling  Your health care provider may ask you questions about your:  Alcohol use.  Tobacco use.  Drug use.  Emotional well-being.  Home and relationship well-being.  Sexual activity.  Eating habits.  History of falls.  Memory and ability to understand (cognition).  Work and work Statistician.  Reproductive health. Screening  You may have the following tests or measurements:  Height, weight, and BMI.  Blood pressure.  Lipid and cholesterol levels. These may be checked every 5 years, or more frequently if you are over 70 years old.  Skin check.  Lung cancer screening. You may have this screening every year starting at age 79 if you have a 30-pack-year history of smoking and currently smoke or have quit within the past 15 years.  Fecal occult blood test (FOBT) of the stool. You may have this test every year starting at age 80.  Flexible sigmoidoscopy or colonoscopy. You may have a sigmoidoscopy every 5 years or a colonoscopy every 10 years starting at age 35.  Hepatitis C blood test.  Hepatitis B blood test.  Sexually transmitted disease (STD) testing.  Diabetes screening. This is done by checking your blood sugar (glucose) after you have not eaten for a while (fasting). You may have this done every 1-3 years.  Bone density scan. This is done to screen for osteoporosis. You may have this done starting at age 4.  Mammogram. This may be done every 1-2 years. Talk to your health care provider about how often you should have regular mammograms. Talk with your health care provider about your test results, treatment options, and if necessary,  the need for more tests. Vaccines  Your health care provider may recommend certain vaccines, such as:  Influenza vaccine. This is recommended every year.  Tetanus, diphtheria, and acellular pertussis (Tdap, Td) vaccine. You may need a  Td booster every 10 years.  Zoster vaccine. You may need this after age 69.  Pneumococcal 13-valent conjugate (PCV13) vaccine. One dose is recommended after age 56.  Pneumococcal polysaccharide (PPSV23) vaccine. One dose is recommended after age 3. Talk to your health care provider about which screenings and vaccines you need and how often you need them. This information is not intended to replace advice given to you by your health care provider. Make sure you discuss any questions you have with your health care provider. Document Released: 08/09/2015 Document Revised: 04/01/2016 Document Reviewed: 05/14/2015 Elsevier Interactive Patient Education  2017 [employer] Prevention in the Home Falls can cause injuries. They can happen to people of all ages. There are many things you can do to make your home safe and to help prevent falls. What can I do on the outside of my home?  Regularly fix the edges of walkways and driveways and fix any cracks.  Remove anything that might make you trip as you walk through a door, such as a raised step or threshold.  Trim any bushes or trees on the path to your home.  Use bright outdoor lighting.  Clear any walking paths of anything that might make someone trip, such as rocks or tools.  Regularly check to see if handrails are loose or broken. Make sure that both sides of any steps have handrails.  Any raised decks and porches should have guardrails on the edges.  Have any leaves, snow, or ice cleared regularly.  Use sand or salt on walking paths during winter.  Clean up any spills in your garage right away. This includes oil or grease spills. What can I do in the bathroom?  Use night lights.  Install grab bars by the toilet and in the tub and shower. Do not use towel bars as grab bars.  Use non-skid mats or decals in the tub or shower.  If you need to sit down in the shower, use a plastic, non-slip stool.  Keep the floor dry. Clean  up any water that spills on the floor as soon as it happens.  Remove soap buildup in the tub or shower regularly.  Attach bath mats securely with double-sided non-slip rug tape.  Do not have throw rugs and other things on the floor that can make you trip. What can I do in the bedroom?  Use night lights.  Make sure that you have a light by your bed that is easy to reach.  Do not use any sheets or blankets that are too big for your bed. They should not hang down onto the floor.  Have a firm chair that has side arms. You can use this for support while you get dressed.  Do not have throw rugs and other things on the floor that can make you trip. What can I do in the kitchen?  Clean up any spills right away.  Avoid walking on wet floors.  Keep items that you use a lot in easy-to-reach places.  If you need to reach something above you, use a strong step stool that has a grab bar.  Keep electrical cords out of the way.  Do not use floor polish or wax that makes floors slippery. If you must use  wax, use non-skid floor wax.  Do not have throw rugs and other things on the floor that can make you trip. What can I do with my stairs?  Do not leave any items on the stairs.  Make sure that there are handrails on both sides of the stairs and use them. Fix handrails that are broken or loose. Make sure that handrails are as long as the stairways.  Check any carpeting to make sure that it is firmly attached to the stairs. Fix any carpet that is loose or worn.  Avoid having throw rugs at the top or bottom of the stairs. If you do have throw rugs, attach them to the floor with carpet tape.  Make sure that you have a light switch at the top of the stairs and the bottom of the stairs. If you do not have them, ask someone to add them for you. What else can I do to help prevent falls?  Wear shoes that:  Do not have high heels.  Have rubber bottoms.  Are comfortable and fit you well.  Are  closed at the toe. Do not wear sandals.  If you use a stepladder:  Make sure that it is fully opened. Do not climb a closed stepladder.  Make sure that both sides of the stepladder are locked into place.  Ask someone to hold it for you, if possible.  Clearly mark and make sure that you can see:  Any grab bars or handrails.  First and last steps.  Where the edge of each step is.  Use tools that help you move around (mobility aids) if they are needed. These include:  Canes.  Walkers.  Scooters.  Crutches.  Turn on the lights when you go into a dark area. Replace any light bulbs as soon as they burn out.  Set up your furniture so you have a clear path. Avoid moving your furniture around.  If any of your floors are uneven, fix them.  If there are any pets around you, be aware of where they are.  Review your medicines with your doctor. Some medicines can make you feel dizzy. This can increase your chance of falling. Ask your doctor what other things that you can do to help prevent falls. This information is not intended to replace advice given to you by your health care provider. Make sure you discuss any questions you have with your health care provider. Document Released: 05/09/2009 Document Revised: 12/19/2015 Document Reviewed: 08/17/2014 Elsevier Interactive Patient Education  2017 [employer].

## 2019-09-19 NOTE — Progress Notes (Signed)
Subjective:   Theresa Andrews is a 74 y.o. female who presents for Medicare Annual (Subsequent) preventive examination.  Virtual Visit via Telephone Note  I connected with Theresa Andrews on 09/19/19 at 10:00 AM EST by telephone and verified that I am speaking with the correct person using two identifiers.  Medicare Annual Wellness visit completed telephonically due to Covid-19 pandemic.   Location: Patient: home Provider: office   I discussed the limitations, risks, security and privacy concerns of performing an evaluation and management service by telephone and the availability of in person appointments. The patient expressed understanding and agreed to proceed.  Some vital signs may be absent or patient reported.   [name], LPN    Review of Systems:   Cardiac Risk Factors include: advanced age (>35men, >75 women);hypertension;dyslipidemia;obesity (BMI >30kg/m2)     Objective:     Vitals: BP 129/69   Pulse 76   Ht 5\' 4"  (1.626 m)   Wt 178 lb (80.7 kg)   BMI 30.55 kg/m   Body mass index is 30.55 kg/m.  Advanced Directives 09/19/2019 01/30/2019 09/13/2018 07/30/2017 04/06/2017 01/04/2017 12/10/2016  Does Patient Have a Medical Advance Directive? No No No No No No No  Would patient like information on creating a medical advance directive? No - Patient declined No - Patient declined Yes (MAU/Ambulatory/Procedural Areas - Information given) Yes (MAU/Ambulatory/Procedural Areas - Information given) - - -    Tobacco Social History   Tobacco Use  Smoking Status Former Smoker  . Packs/day: 0.25  . Years: 25.00  . Pack years: 6.25  . Types: Cigarettes  . Quit date: 2008  . Years since quitting: 13.1  Smokeless Tobacco Never Used  Tobacco Comment   smoking cessation materials not required     Counseling given: Not Answered Comment: smoking cessation materials not required   Clinical Intake:  Pre-visit preparation completed: Yes  Pain : No/denies pain     BMI -  recorded: 30.55 Nutritional Status: BMI > 30  Obese Nutritional Risks: None Diabetes: No  How often do you need to have someone help you when you read instructions, pamphlets, or other written materials from your doctor or pharmacy?: 1 - Never  Interpreter Needed?: No  Information entered by :: [name] LPN  Past Medical History:  Diagnosis Date  . Anxiety   . Breast cancer ([place])    pT2 pN0 cM0 (stage IIA) invasive mammary carcinoma of left outer breast status post lumpectomy and sentinel node study on July 14, 2010  . CAD (coronary artery disease)   . Depression   . H/O hypokalemia   . History of chemotherapy   . History of MI (myocardial infarction)   . Hyperglycemia   . Hyperlipidemia   . Hypertension   . Peripheral neuropathy due to chemotherapy ([place])   . Personal history of chemotherapy 2011   left breast ca  . Personal history of radiation therapy 2001   left breast ca   Past Surgical History:  Procedure Laterality Date  . ABDOMINAL HYSTERECTOMY    . BREAST BIOPSY Left 2008   neg  . BREAST BIOPSY Left 03/05/2010   invasive mammary carcinoma  . BREAST LUMPECTOMY Left 07/14/2010   INVASIVE MAMMARY CARCINOMA WITH PROMINENT INFILTRATIVE PATTERN, negative margins  . CHOLECYSTECTOMY    . COLONOSCOPY WITH PROPOFOL N/A 11/22/2015   Procedure: COLONOSCOPY WITH PROPOFOL;  Surgeon: Hulen Luster, MD;  Location: [facility] END[facility]COPY;  Service: Gastroenterology;  Laterality: N/A;  . CORONARY ANGIOPLASTY WITH STENT  PLACEMENT  2008   Family History  Problem Relation Age of Onset  . Breast cancer Sister 51  . Hypertension Mother   . Aneurysm Mother   . Heart attack Sister   . Birth defects Sister    Social History   Socioeconomic History  . Marital status: Divorced    Spouse name: Not on file  . Number of children: 3  . Years of education: some college, no degree  . Highest education level: 12th grade  Occupational History    Employer: RETIRED  Tobacco Use  . Smoking  status: Former Smoker    Packs/day: 0.25    Years: 25.00    Pack years: 6.25    Types: Cigarettes    Quit date: 2008    Years since quitting: 13.1  . Smokeless tobacco: Never Used  . Tobacco comment: smoking cessation materials not required  Substance and Sexual Activity  . Alcohol use: No    Comment: rare; holidays  . Drug use: No  . Sexual activity: Not Currently  Other Topics Concern  . Not on file  Social History Narrative  . Not on file   Social Determinants of Health   Financial Resource Strain: Low Risk   . Difficulty of Paying Living Expenses: Not hard at all  Food Insecurity: No Food Insecurity  . Worried About Charity fundraiser in the Last Year: Never true  . Ran Out of Food in the Last Year: Never true  Transportation Needs: No Transportation Needs  . Lack of Transportation (Medical): No  . Lack of Transportation (Non-Medical): No  Physical Activity: Sufficiently Active  . Days of Exercise per Week: 7 days  . Minutes of Exercise per Session: 30 min  Stress: No Stress Concern Present  . Feeling of Stress : Not at all  Social Connections: Moderately Isolated  . Frequency of Communication with Friends and Family: More than three times a week  . Frequency of Social Gatherings with Friends and Family: Three times a week  . Attends Religious Services: Never  . Active Member of Clubs or Organizations: No  . Attends Archivist Meetings: Never  . Marital Status: Divorced    Outpatient Encounter Medications as of 09/19/2019  Medication Sig  . amLODipine (NORVASC) 10 MG tablet Take 1 tablet (10 mg total) by mouth daily.  Marland Kitchen aspirin 81 MG EC tablet Take 81 mg by mouth daily.  . cholecalciferol (VITAMIN D) 1000 units tablet Take 1,000 Units by mouth daily.  . clindamycin-benzoyl peroxide (BENZACLIN) gel Apply 1 application topically 2 (two) times daily.   . metoprolol succinate (TOPROL-XL) 25 MG 24 hr tablet Take 0.5 tablets (12.5 mg total) by mouth daily.  Marland Kitchen  PARoxetine (PAXIL) 10 MG tablet Take 1 tablet (10 mg total) by mouth daily.  . rosuvastatin (CRESTOR) 5 MG tablet Take 5 mg by mouth daily.  Marland Kitchen telmisartan (MICARDIS) 80 MG tablet Take 1 tablet by mouth once daily for blood pressure  . tretinoin (RETIN-A) 0.025 % cream Apply topically at bedtime.    No facility-administered encounter medications on file as of 09/19/2019.    Activities of Daily Living In your present state of health, do you have any difficulty performing the following activities: 09/19/2019 11/29/2018  Hearing? N N  Comment declines hearing aids -  Vision? N N  Difficulty concentrating or making decisions? N N  Walking or climbing stairs? N N  Dressing or bathing? N N  Doing errands, shopping? N [employer]  and eating ? N -  Using the Toilet? N -  In the past six months, have you accidently leaked urine? N -  Do you have problems with loss of bowel control? N -  Managing your Medications? N -  Managing your Finances? N -  Housekeeping or managing your Housekeeping? N -  Some recent data might be hidden    Patient Care Team: Hubbard Hartshorn, FNP as PCP - General (Family Medicine) Cammie Sickle, MD as Consulting Physician (Oncology) Corey Skains, MD as Consulting Physician (Cardiology) Gabriel Carina Betsey Holiday, MD as Consulting Physician (Endocrinology) Baxter Kail, MD as Consulting Physician (Dermatology) Anell Barr, OD as Consulting Physician (Optometry)    Assessment:   This is a routine wellness examination for Shoni.  Exercise Activities and Dietary recommendations Current Exercise Habits: Home exercise routine, Type of exercise: treadmill, Time (Minutes): 30, Frequency (Times/Week): 7, Weekly Exercise (Minutes/Week): 210, Intensity: Moderate, Exercise limited by: None identified  Goals    . DIET - INCREASE WATER INTAKE     Recommend to drink at least 6-8 8oz glasses of water per day     . Weight (lb) < 170 lb (77.1 kg)     Pt states she  would like to lose 10 lbs        Fall Risk Fall Risk  09/19/2019 05/17/2019 11/29/2018 09/13/2018 08/30/2018  Falls in the past year? 0 0 0 0 0  Number falls in past yr: 0 0 0 0 0  Injury with Fall? 0 0 0 0 0  Risk for fall due to : No Fall Risks - - - -  Follow up Falls prevention discussed Falls evaluation completed - Falls prevention discussed -   FALL RISK PREVENTION PERTAINING TO THE HOME:  Any stairs in or around the home? Yes  If so, do they handrails? Yes   Home free of loose throw rugs in walkways, pet beds, electrical cords, etc? Yes  Adequate lighting in your home to reduce risk of falls? Yes   ASSISTIVE DEVICES UTILIZED TO PREVENT FALLS:  Life alert? No  Use of a cane, walker or w/c? No  Grab bars in the bathroom? Yes  Shower chair or bench in shower? No  Elevated toilet seat or a handicapped toilet? No   DME ORDERS:  DME order needed?  No   TIMED UP AND GO:  Was the test performed? No . Telephonic visit.   Education: Fall risk prevention has been discussed.  Intervention(s) required? No   Depression Screen PHQ 2/9 Scores 09/19/2019 05/17/2019 11/29/2018 09/13/2018  PHQ - 2 Score 0 0 0 0  PHQ- 9 Score - 0 - 0     Cognitive Function     6CIT Screen 09/19/2019 09/13/2018 07/30/2017  What Year? 0 points 0 points 0 points  What month? 0 points 0 points 0 points  What time? 0 points 0 points 0 points  Count back from 20 0 points 0 points 0 points  Months in reverse 0 points 0 points 0 points  Repeat phrase 0 points 0 points 0 points  Total Score 0 0 0    Immunization History  Administered Date(s) Administered  . Influenza, High Dose Seasonal PF 04/29/2015, 05/08/2016, 04/06/2017, 05/02/2018, 04/14/2019  . Influenza-Unspecified 05/09/2014, 05/02/2018  . Pneumococcal Conjugate-13 05/09/2014  . Pneumococcal Polysaccharide-23 05/08/2016  . Zoster 07/27/2012    Qualifies for Shingles Vaccine? Yes  Zostavax completed 2014. Due for Shingrix. Education has been  provided regarding the importance of  this vaccine. Pt has been advised to call insurance company to determine out of pocket expense. Advised may also receive vaccine at local pharmacy or Health Dept. Verbalized acceptance and understanding.  Tdap: pt allergic to tetanus  Flu Vaccine: Up to date  Pneumococcal Vaccine: Up to date   Screening Tests Health Maintenance  Topic Date Due  . Hepatitis C Screening  [birth year]  . MAMMOGRAM  01/26/2020  . COLONOSCOPY  11/21/2020  . INFLUENZA VACCINE  Completed  . PNA vac Low Risk Adult  Completed  . DEXA SCAN  Discontinued  . TETANUS/TDAP  Discontinued    Cancer Screenings:  Colorectal Screening: Completed 11/22/15. Repeat every 5 years  Mammogram: Completed 01/26/19. Repeat every year; scheduled for 01/30/20  Bone Density: Completed 01/07/18. Results reflect NORMAL. Repeat every 2 years.   Lung Cancer Screening: (Low Dose CT Chest recommended if Age 12-80 years, 30 pack-year currently smoking OR have quit w/in 15years.) does not qualify.   Additional Screening:  Hepatitis C Screening: does qualify; postponed  Vision Screening: Recommended annual ophthalmology exams for early detection of glaucoma and other disorders of the eye. Is the patient up to date with their annual eye exam?  Yes  Who is the provider or what is the name of the office in which the pt attends annual eye exams? [place] Screening: Recommended annual dental exams for proper oral hygiene  Community Resource Referral:  CRR required this visit?  No      Plan:     I have personally reviewed and addressed the Medicare Annual Wellness questionnaire and have noted the following in the patient's chart:  A. Medical and social history B. Use of alcohol, tobacco or illicit drugs  C. Current medications and supplements D. Functional ability and status E.  Nutritional status F.  Physical activity G. Advance directives H. List of other physicians I.    Hospitalizations, surgeries, and ER visits in previous 12 months J.  [place] such as hearing and vision if needed, cognitive and depression L. Referrals and appointments   In addition, I have reviewed and discussed with patient certain preventive protocols, quality metrics, and best practice recommendations. A written personalized care plan for preventive services as well as general preventive health recommendations were provided to patient.   Signed,  [name], LPN Nurse Health Advisor   Nurse Notes: added patient to waiting list for [facility] Covid vaccine per her request.

## 2019-11-02 ENCOUNTER — Encounter: Payer: Self-pay | Admitting: Family Medicine

## 2019-11-07 ENCOUNTER — Encounter: Payer: Self-pay | Admitting: Family Medicine

## 2019-11-07 ENCOUNTER — Ambulatory Visit (INDEPENDENT_AMBULATORY_CARE_PROVIDER_SITE_OTHER): Payer: Medicare PPO | Admitting: Family Medicine

## 2019-11-07 ENCOUNTER — Other Ambulatory Visit: Payer: Self-pay

## 2019-11-07 VITALS — BP 124/76 | HR 72 | Ht 64.0 in | Wt 185.0 lb

## 2019-11-07 DIAGNOSIS — I10 Essential (primary) hypertension: Secondary | ICD-10-CM | POA: Diagnosis not present

## 2019-11-07 DIAGNOSIS — Z1159 Encounter for screening for other viral diseases: Secondary | ICD-10-CM | POA: Diagnosis not present

## 2019-11-07 NOTE — Progress Notes (Signed)
Name: Theresa Andrews   MRN: [id number]    DOB: 12/11/1945   Date:11/07/2019       Progress Note  Subjective  Chief Complaint  Chief Complaint  Patient presents with  . Foot Swelling    Onset- 1 month, Feels like she is retaining fluid in her feet and hands. Makes her feet feel funny-has been drinking alot of water and watching her salt intake    I connected with  [name] on 11/07/19 at 11:00 AM EDT by telephone and verified that I am speaking with the correct person using two identifiers.  I discussed the limitations, risks, security and privacy concerns of performing an evaluation and management service by telephone and the availability of in person appointments. Staff also discussed with the patient that there may be a patient responsible charge related to this service. Patient Location: at home  Provider Location: [facility]   HPI  Edema: she states that over the past month she has noticed that her feet and hand seems to feel swollen and tight. She takes medications for bp at night and wakes up with feet being a little puffy but worse at night. She denies orthopnea, chest pain, palpitation or decrease in exercise tolerance. She is on Norvasc and may be side effects of medication. Advised patient to come in for labs and bp check we may switch to bystolic if labs are normal    Patient Active Problem List   Diagnosis Date Noted  . Bradycardia 10/04/2018  . Obesity (BMI 30-39.9) 05/12/2018  . Statin myopathy 05/12/2018  . Vitamin D deficiency 01/09/2018  . Hyperparathyroidism ([place]) 01/07/2018  . Carcinoma of overlapping sites of left breast in female, estrogen receptor positive ([place]) 03/27/2016  . Hypercalcemia 08/20/2015  . Diuretic-induced hypokalemia 04/29/2015  . Abnormal finding on thyroid function test 10/29/2014  . Anxiety disorder 10/29/2014  . Breast CA ([place]) 10/29/2014  . Atherosclerosis of coronary artery 10/29/2014  . Prediabetes 10/29/2014  . Gravida 1 10/29/2014    . Hyperlipidemia 10/29/2014  . Chronic cervical pain 10/29/2014  . Disorder of peripheral nervous system 10/29/2014  . At risk for falling 10/29/2014  . Neoplasm of skin 10/29/2014  . Benign essential HTN 04/25/2014    Past Surgical History:  Procedure Laterality Date  . ABDOMINAL HYSTERECTOMY    . BREAST BIOPSY Left 2008   neg  . BREAST BIOPSY Left 03/05/2010   invasive mammary carcinoma  . BREAST LUMPECTOMY Left 07/14/2010   INVASIVE MAMMARY CARCINOMA WITH PROMINENT INFILTRATIVE PATTERN, negative margins  . CHOLECYSTECTOMY    . COLONOSCOPY WITH PROPOFOL N/A 11/22/2015   Procedure: COLONOSCOPY WITH PROPOFOL;  Surgeon: Hulen Luster, MD;  Location: [facility] ENDOSCOPY;  Service: Gastroenterology;  Laterality: N/A;  . CORONARY ANGIOPLASTY WITH STENT PLACEMENT  2008    Family History  Problem Relation Age of Onset  . Breast cancer Sister 21  . Hypertension Mother   . Aneurysm Mother   . Heart attack Sister   . Birth defects Sister     Social History   Socioeconomic History  . Marital status: Divorced    Spouse name: Not on file  . Number of children: 3  . Years of education: some college, no degree  . Highest education level: 12th grade  Occupational History    Employer: RETIRED  Tobacco Use  . Smoking status: Former Smoker    Packs/day: 0.25    Years: 25.00    Pack years: 6.25    Types: Cigarettes  Quit date: 2008    Years since quitting: 13.2  . Smokeless tobacco: Never Used  . Tobacco comment: smoking cessation materials not required  Substance and Sexual Activity  . Alcohol use: No    Comment: rare; holidays  . Drug use: No  . Sexual activity: Not Currently  Other Topics Concern  . Not on file  Social History Narrative  . Not on file   Social Determinants of Health   Financial Resource Strain: Low Risk   . Difficulty of Paying Living Expenses: Not hard at all  Food Insecurity: No Food Insecurity  . Worried About Charity fundraiser in the Last Year:  Never true  . Ran Out of Food in the Last Year: Never true  Transportation Needs: No Transportation Needs  . Lack of Transportation (Medical): No  . Lack of Transportation (Non-Medical): No  Physical Activity: Sufficiently Active  . Days of Exercise per Week: 7 days  . Minutes of Exercise per Session: 30 min  Stress: No Stress Concern Present  . Feeling of Stress : Not at all  Social Connections: Moderately Isolated  . Frequency of Communication with Friends and Family: More than three times a week  . Frequency of Social Gatherings with Friends and Family: Three times a week  . Attends Religious Services: Never  . Active Member of Clubs or Organizations: No  . Attends Archivist Meetings: Never  . Marital Status: Divorced  Human resources officer Violence: Not At Risk  . Fear of Current or Ex-Partner: No  . Emotionally Abused: No  . Physically Abused: No  . Sexually Abused: No     Current Outpatient Medications:  .  amLODipine (NORVASC) 10 MG tablet, Take 1 tablet (10 mg total) by mouth daily., Disp: 90 tablet, Rfl: 1 .  aspirin 81 MG EC tablet, Take 81 mg by mouth daily., Disp: , Rfl:  .  cholecalciferol (VITAMIN D) 1000 units tablet, Take 1,000 Units by mouth daily., Disp: , Rfl:  .  clindamycin-benzoyl peroxide (BENZACLIN) gel, Apply 1 application topically 2 (two) times daily. , Disp: , Rfl: 0 .  metoprolol succinate (TOPROL-XL) 25 MG 24 hr tablet, Take 0.5 tablets (12.5 mg total) by mouth daily., Disp: 45 tablet, Rfl: 3 .  PARoxetine (PAXIL) 10 MG tablet, Take 1 tablet (10 mg total) by mouth daily., Disp: 90 tablet, Rfl: 3 .  rosuvastatin (CRESTOR) 5 MG tablet, Take 5 mg by mouth daily., Disp: , Rfl:  .  telmisartan (MICARDIS) 80 MG tablet, Take 1 tablet by mouth once daily for blood pressure, Disp: 90 tablet, Rfl: 1 .  tretinoin (RETIN-A) 0.025 % cream, Apply topically at bedtime. , Disp: , Rfl: 4  Allergies  Allergen Reactions  . Tetanus Toxoids Shortness Of Breath and  Rash  . Shellfish Allergy     I personally reviewed active problem list, medication list, allergies, family history, social history, health maintenance with the patient/caregiver today.   ROS  Ten systems reviewed and is negative except as mentioned in HPI   Objective  Virtual encounter, vitals not obtained.  Body mass index is 31.76 kg/m.  Physical Exam  Awake, alert and oriented  PHQ2/9: Depression screen [facility] 2/9 11/07/2019 09/19/2019 05/17/2019 11/29/2018 09/13/2018  Decreased Interest 0 0 0 0 0  Down, Depressed, Hopeless 0 0 0 0 0  PHQ - 2 Score 0 0 0 0 0  Altered sleeping 0 - 0 - 0  Tired, decreased energy 0 - 0 - 0  Change in  appetite 0 - 0 - 0  Feeling bad or failure about yourself  0 - 0 - 0  Trouble concentrating 0 - 0 - 0  Moving slowly or fidgety/restless 0 - 0 - 0  Suicidal thoughts 0 - 0 - 0  PHQ-9 Score 0 - 0 - 0  Difficult doing work/chores Not difficult at all - Not difficult at all - Not difficult at all   PHQ-2/9 Result is negative.    Fall Risk: Fall Risk  11/07/2019 09/19/2019 05/17/2019 11/29/2018 09/13/2018  Falls in the past year? 0 0 0 0 0  Number falls in past yr: 0 0 0 0 0  Injury with Fall? 0 0 0 0 0  Risk for fall due to : - No Fall Risks - - -  Follow up - Falls prevention discussed Falls evaluation completed - Falls prevention discussed     Assessment & Plan  1. Benign essential HTN  - Microalbumin / creatinine urine ratio - COMPLETE METABOLIC PANEL WITH GFR - CBC with Differential/Platelet  2. Hypercalcemia   3. Need for hepatitis C screening test  - Hepatitis C antibody  I discussed the assessment and treatment plan with the patient. The patient was provided an opportunity to ask questions and all were answered. The patient agreed with the plan and demonstrated an understanding of the instructions.   The patient was advised to call back or seek an in-person evaluation if the symptoms worsen or if the condition fails to improve as  anticipated.  I provided 15 minutes of non-face-to-face time during this encounter.  Loistine Chance, MD

## 2019-11-14 ENCOUNTER — Ambulatory Visit: Payer: Medicare PPO | Admitting: Emergency Medicine

## 2019-11-14 ENCOUNTER — Other Ambulatory Visit: Payer: Self-pay

## 2019-11-14 VITALS — BP 128/88 | HR 81 | Resp 14 | Wt 193.0 lb

## 2019-11-14 DIAGNOSIS — I10 Essential (primary) hypertension: Secondary | ICD-10-CM

## 2019-11-14 NOTE — Progress Notes (Signed)
Patient is here for a blood pressure check. Patient denies chest pain, palpitations, shortness of breath or visual disturbances. At previous visit blood pressure was 124/76 with a heart rate of 72. Today during nurse visit first check blood pressure was 128/88 and heart rate was 81. She does take any blood pressure medications.

## 2019-11-15 LAB — CBC WITH DIFFERENTIAL/PLATELET
Absolute Monocytes: 578 cells/uL (ref 200–950)
Basophils Absolute: 71 cells/uL (ref 0–200)
Basophils Relative: 1.2 %
Eosinophils Absolute: 378 cells/uL (ref 15–500)
Eosinophils Relative: 6.4 %
HCT: 46.6 % — ABNORMAL HIGH (ref 35.0–45.0)
Hemoglobin: 15.1 g/dL (ref 11.7–15.5)
Lymphs Abs: 2030 cells/uL (ref 850–3900)
MCH: 30.2 pg (ref 27.0–33.0)
MCHC: 32.4 g/dL (ref 32.0–36.0)
MCV: 93.2 fL (ref 80.0–100.0)
MPV: 11.4 fL (ref 7.5–12.5)
Monocytes Relative: 9.8 %
Neutro Abs: 2844 cells/uL (ref 1500–7800)
Neutrophils Relative %: 48.2 %
Platelets: 213 10*3/uL (ref 140–400)
RBC: 5 10*6/uL (ref 3.80–5.10)
RDW: 13.1 % (ref 11.0–15.0)
Total Lymphocyte: 34.4 %
WBC: 5.9 10*3/uL (ref 3.8–10.8)

## 2019-11-15 LAB — COMPLETE METABOLIC PANEL WITH GFR
AG Ratio: 1.4 (calc) (ref 1.0–2.5)
ALT: 19 U/L (ref 6–29)
AST: 18 U/L (ref 10–35)
Albumin: 4.1 g/dL (ref 3.6–5.1)
Alkaline phosphatase (APISO): 107 U/L (ref 37–153)
BUN: 10 mg/dL (ref 7–25)
CO2: 25 mmol/L (ref 20–32)
Calcium: 10.4 mg/dL (ref 8.6–10.4)
Chloride: 105 mmol/L (ref 98–110)
Creat: 0.71 mg/dL (ref 0.60–0.93)
GFR, Est African American: 98 mL/min/{1.73_m2} (ref >=60)
GFR, Est Non African American: 84 mL/min/{1.73_m2} (ref >=60)
Globulin: 2.9 g/dL (calc) (ref 1.9–3.7)
Glucose, Bld: 135 mg/dL — ABNORMAL HIGH (ref 65–99)
Potassium: 3.6 mmol/L (ref 3.5–5.3)
Sodium: 140 mmol/L (ref 135–146)
Total Bilirubin: 0.5 mg/dL (ref 0.2–1.2)
Total Protein: 7 g/dL (ref 6.1–8.1)

## 2019-11-15 LAB — MICROALBUMIN / CREATININE URINE RATIO
Creatinine, Urine: 116 mg/dL (ref 20–275)
Microalb Creat Ratio: 103 mcg/mg creat — ABNORMAL HIGH (ref <30)
Microalb, Ur: 11.9 mg/dL

## 2019-11-15 LAB — HEPATITIS C ANTIBODY
Hepatitis C Ab: NONREACTIVE
SIGNAL TO CUT-OFF: 0.01 (ref <1.00)

## 2019-12-01 ENCOUNTER — Telehealth: Payer: Self-pay

## 2019-12-01 NOTE — Telephone Encounter (Signed)
Copied from [place] [id number]. Topic: General - Other >> Nov 30, 2019 12:05 PM Celene Kras wrote: Reason for CRM: Pt called stating that she believes she has food poisoning. Pt is requesting an appt today. Please advise. >> Nov 30, 2019 12:15 PM My[place], Marland Kitchen wrote: Contacted pt to inform her we do not have any openings for today. Advised pt to go to UC if she felt the need. Pt would like to know what she can by over the counter to help you. Please advise.

## 2019-12-07 ENCOUNTER — Ambulatory Visit: Payer: Medicare PPO | Admitting: Family Medicine

## 2019-12-07 ENCOUNTER — Encounter: Payer: Self-pay | Admitting: Family Medicine

## 2019-12-07 ENCOUNTER — Other Ambulatory Visit: Payer: Self-pay

## 2019-12-07 VITALS — BP 128/74 | HR 88 | Temp 97.1°F | Resp 16 | Ht 64.0 in | Wt 193.1 lb

## 2019-12-07 DIAGNOSIS — F411 Generalized anxiety disorder: Secondary | ICD-10-CM

## 2019-12-07 DIAGNOSIS — I2510 Atherosclerotic heart disease of native coronary artery without angina pectoris: Secondary | ICD-10-CM

## 2019-12-07 DIAGNOSIS — E559 Vitamin D deficiency, unspecified: Secondary | ICD-10-CM

## 2019-12-07 DIAGNOSIS — Z853 Personal history of malignant neoplasm of breast: Secondary | ICD-10-CM

## 2019-12-07 DIAGNOSIS — E782 Mixed hyperlipidemia: Secondary | ICD-10-CM

## 2019-12-07 DIAGNOSIS — E213 Hyperparathyroidism, unspecified: Secondary | ICD-10-CM

## 2019-12-07 DIAGNOSIS — I10 Essential (primary) hypertension: Secondary | ICD-10-CM

## 2019-12-07 MED ORDER — TELMISARTAN 80 MG PO TABS: ORAL_TABLET | ORAL | 1 refills | Status: DC

## 2019-12-07 MED ORDER — AMLODIPINE BESYLATE 10 MG PO TABS: 10.0000 mg | ORAL_TABLET | Freq: Every day | ORAL | 1 refills | Status: DC

## 2019-12-07 NOTE — Progress Notes (Signed)
Name: Theresa Andrews   MRN: [id number]    DOB: [birth year]   Date:12/07/2019       Progress Note  Subjective  Chief Complaint  Chief Complaint  Patient presents with  . Hypertension    1 month follow up, swelling    HPI  HTN: bp today is at goal, she has been taking Micardis 80 mg and Norvasc 10 mg, she denies chest pain, palpitation or sob.  She used to be on hctz but it was stopped due to hypercalcemia. She as microalbuminuria, we will continue to monitor   Hyperparathyroidism: primary seen by Dr. Gabriel Carina in 2019, last calcium was normal at 10.4, she is off HCTZ, we will recheck Pth on her next visit   Hyperlipidemia/CAD: LDL was 81 on last labs, but intolerant to high dose statin ,  she takes Rosuvastatin given by Dr. Nehemiah Massed , she has atherosclerosis of coronary artery disease , s/p stent placement in 2008. No chest pain or SOB with activity . She walks daily on her treadmill and able to cut her own grass   Anxiety : taking paxil once daily and states stable on medication, phq 9 is negative  History of breast cancer: under the care of Dr. Rogue Bussing , breast cancer diagnosed on 2011 , s/p lumpectomy, had chemo and radiation, not on suppressive therapy. Getting mammograms yearly  Pre-diabetes: check A1C yearly, she denies polyphagia, polydipsia or polyuria  Alcohol misuse: she states she lost 4 relatives/friends all in January 2021 and it hit her hard. She states she started to drinking [place] one or two shots with coke every night ,she noticed some swelling on both ankles and feet. She states she stopped drinking and the edema resolved. She had a visit with me in April and asked her to have labs done at the time  Patient Active Problem List   Diagnosis Date Noted  . Bradycardia 10/04/2018  . Obesity (BMI 30-39.9) 05/12/2018  . Statin myopathy 05/12/2018  . Vitamin D deficiency 01/09/2018  . Hyperparathyroidism ([place]) 01/07/2018  . Carcinoma of overlapping sites of left breast in  female, estrogen receptor positive ([place]) 03/27/2016  . Hypercalcemia 08/20/2015  . Diuretic-induced hypokalemia 04/29/2015  . Abnormal finding on thyroid function test 10/29/2014  . Anxiety disorder 10/29/2014  . Breast CA ([place]) 10/29/2014  . Atherosclerosis of coronary artery 10/29/2014  . Prediabetes 10/29/2014  . Gravida 1 10/29/2014  . Hyperlipidemia 10/29/2014  . Chronic cervical pain 10/29/2014  . Disorder of peripheral nervous system 10/29/2014  . At risk for falling 10/29/2014  . Neoplasm of skin 10/29/2014  . Benign essential HTN 04/25/2014    Past Surgical History:  Procedure Laterality Date  . ABDOMINAL HYSTERECTOMY    . BREAST BIOPSY Left 2008   neg  . BREAST BIOPSY Left 03/05/2010   invasive mammary carcinoma  . BREAST LUMPECTOMY Left 07/14/2010   INVASIVE MAMMARY CARCINOMA WITH PROMINENT INFILTRATIVE PATTERN, negative margins  . CHOLECYSTECTOMY    . COLONOSCOPY WITH PROPOFOL N/A 11/22/2015   Procedure: COLONOSCOPY WITH PROPOFOL;  Surgeon: Hulen Luster, MD;  Location: [facility] ENDOSCOPY;  Service: Gastroenterology;  Laterality: N/A;  . CORONARY ANGIOPLASTY WITH STENT PLACEMENT  2008    Family History  Problem Relation Age of Onset  . Breast cancer Sister 24  . Hypertension Mother   . Aneurysm Mother   . Heart attack Sister   . Birth defects Sister     Social History   Tobacco Use  . Smoking status: Former Smoker  Packs/day: 0.25    Years: 25.00    Pack years: 6.25    Types: Cigarettes    Quit date: 2008    Years since quitting: 13.3  . Smokeless tobacco: Never Used  . Tobacco comment: smoking cessation materials not required  Substance Use Topics  . Alcohol use: No    Comment: rare; holidays     Current Outpatient Medications:  .  amLODipine (NORVASC) 10 MG tablet, Take 1 tablet (10 mg total) by mouth daily., Disp: 90 tablet, Rfl: 1 .  aspirin 81 MG EC tablet, Take 81 mg by mouth daily., Disp: , Rfl:  .  cholecalciferol (VITAMIN D) 1000 units  tablet, Take 1,000 Units by mouth daily., Disp: , Rfl:  .  clindamycin-benzoyl peroxide (BENZACLIN) gel, Apply 1 application topically 2 (two) times daily. , Disp: , Rfl: 0 .  metoprolol succinate (TOPROL-XL) 25 MG 24 hr tablet, Take 0.5 tablets (12.5 mg total) by mouth daily., Disp: 45 tablet, Rfl: 3 .  PARoxetine (PAXIL) 10 MG tablet, Take 1 tablet (10 mg total) by mouth daily., Disp: 90 tablet, Rfl: 3 .  rosuvastatin (CRESTOR) 5 MG tablet, Take 5 mg by mouth daily., Disp: , Rfl:  .  telmisartan (MICARDIS) 80 MG tablet, One daily, Disp: 90 tablet, Rfl: 1 .  tretinoin (RETIN-A) 0.025 % cream, Apply topically at bedtime. , Disp: , Rfl: 4  Allergies  Allergen Reactions  . Tetanus Toxoids Shortness Of Breath and Rash  . Shellfish Allergy     I personally reviewed active problem list, medication list, allergies, family history, social history, health maintenance with the patient/caregiver today.   ROS  Ten systems reviewed and is negative except as mentioned in HPI  She had diarrhea last week, she thinks it was a bug, currently doing well   Objective  Vitals:   12/07/19 0952  BP: 128/74  Pulse: 88  Resp: 16  Temp: (!) 97.1 F (36.2 C)  TempSrc: Temporal  SpO2: 98%  Weight: 193 lb 1.6 oz (87.6 kg)  Height: 5\' 4"  (1.626 m)    Body mass index is 33.15 kg/m.  Physical Exam  Constitutional: Patient appears well-developed and well-nourished. Obese No distress.  HEENT: head atraumatic, normocephalic, pupils equal and reactive to light,  neck supple Cardiovascular: Normal rate, regular rhythm and normal heart sounds.  No murmur heard. No BLE edema. Pulmonary/Chest: Effort normal and breath sounds normal. No respiratory distress. Abdominal: Soft.  There is no tenderness. Psychiatric: Patient has a normal mood and affect. behavior is normal. Judgment and thought content normal.  Recent Results (from the past 2160 hour(s))  Microalbumin / creatinine urine ratio     Status:  Abnormal   Collection Time: 11/14/19 10:05 AM  Result Value Ref Range   Creatinine, Urine 116 20 - 275 mg/dL   Microalb, Ur 11.9 mg/dL    Comment: Reference Range Not established    Microalb Creat Ratio 103 (H) <30 mcg/mg creat    Comment: . The ADA defines abnormalities in albumin excretion as follows: Marland Kitchen Category         Result (mcg/mg creatinine) . Normal                    <30 Microalbuminuria         30-299  Clinical albuminuria   > OR = 300 . The ADA recommends that at least two of three specimens collected within a 3-6 month period be abnormal before considering a patient to be within a  diagnostic category.   COMPLETE METABOLIC PANEL WITH GFR     Status: Abnormal   Collection Time: 11/14/19 10:05 AM  Result Value Ref Range   Glucose, Bld 135 (H) 65 - 99 mg/dL    Comment: .            Fasting reference interval . For someone without known diabetes, a glucose value >125 mg/dL indicates that they may have diabetes and this should be confirmed with a follow-up test. .    BUN 10 7 - 25 mg/dL   Creat 0.71 0.60 - 0.93 mg/dL    Comment: For patients >25 years of age, the reference limit for Creatinine is approximately 13% higher for people identified as African-American. .    GFR, Est Non African American 84 > OR = 60 mL/min/1.49m2   GFR, Est African American 98 > OR = 60 mL/min/1.25m2   BUN/Creatinine Ratio NOT APPLICABLE 6 - 22 (calc)   Sodium 140 135 - 146 mmol/L   Potassium 3.6 3.5 - 5.3 mmol/L   Chloride 105 98 - 110 mmol/L   CO2 25 20 - 32 mmol/L   Calcium 10.4 8.6 - 10.4 mg/dL   Total Protein 7.0 6.1 - 8.1 g/dL   Albumin 4.1 3.6 - 5.1 g/dL   Globulin 2.9 1.9 - 3.7 g/dL (calc)   AG Ratio 1.4 1.0 - 2.5 (calc)   Total Bilirubin 0.5 0.2 - 1.2 mg/dL   Alkaline phosphatase (APISO) 107 37 - 153 U/L   AST 18 10 - 35 U/L   ALT 19 6 - 29 U/L  CBC with Differential/Platelet     Status: Abnormal   Collection Time: 11/14/19 10:05 AM  Result Value Ref Range   WBC  5.9 3.8 - 10.8 Thousand/uL   RBC 5.00 3.80 - 5.10 Million/uL   Hemoglobin 15.1 11.7 - 15.5 g/dL   HCT 46.6 (H) 35.0 - 45.0 %   MCV 93.2 80.0 - 100.0 fL   MCH 30.2 27.0 - 33.0 pg   MCHC 32.4 32.0 - 36.0 g/dL   RDW 13.1 11.0 - 15.0 %   Platelets 213 140 - 400 Thousand/uL   MPV 11.4 7.5 - 12.5 fL   Neutro Abs 2,844 1,500 - 7,800 cells/uL   Lymphs Abs 2,030 850 - 3,900 cells/uL   Absolute Monocytes 578 200 - 950 cells/uL   Eosinophils Absolute 378 15 - 500 cells/uL   Basophils Absolute 71 0 - 200 cells/uL   Neutrophils Relative % 48.2 %   Total Lymphocyte 34.4 %   Monocytes Relative 9.8 %   Eosinophils Relative 6.4 %   Basophils Relative 1.2 %  Hepatitis C antibody     Status: None   Collection Time: 11/14/19 10:05 AM  Result Value Ref Range   Hepatitis C Ab NON-REACTIVE NON-REACTI   SIGNAL TO CUT-OFF 0.01 <1.00    Comment: . HCV antibody was non-reactive. There is no laboratory  evidence of HCV infection. . In most cases, no further action is required. However, if recent HCV exposure is suspected, a test for HCV RNA (test code [id number]) is suggested. . For additional information please refer to http://education.quest[facility].com/faq/FAQ22v1 (This link is being provided for informational/ educational purposes only.) .       PHQ2/9: Depression screen [facility] 2/9 12/07/2019 11/07/2019 09/19/2019 05/17/2019 11/29/2018  Decreased Interest 0 0 0 0 0  Down, Depressed, Hopeless 0 0 0 0 0  PHQ - 2 Score 0 0 0 0 0  Altered sleeping 0 0 - 0 -  Tired, decreased energy 0 0 - 0 -  Change in appetite 0 0 - 0 -  Feeling bad or failure about yourself  0 0 - 0 -  Trouble concentrating 0 0 - 0 -  Moving slowly or fidgety/restless 0 0 - 0 -  Suicidal thoughts 0 0 - 0 -  PHQ-9 Score 0 0 - 0 -  Difficult doing work/chores Not difficult at all Not difficult at all - Not difficult at all -  Some recent data might be hidden    phq 9 is negative   Fall Risk: Fall Risk  12/07/2019 11/07/2019  09/19/2019 05/17/2019 11/29/2018  Falls in the past year? 0 0 0 0 0  Number falls in past yr: 0 0 0 0 0  Injury with Fall? 0 0 0 0 0  Risk for fall due to : - - No Fall Risks - -  Follow up Falls evaluation completed - Falls prevention discussed Falls evaluation completed -      Functional Status Survey: Is the patient deaf or have difficulty hearing?: No Does the patient have difficulty seeing, even when wearing glasses/contacts?: No Does the patient have difficulty concentrating, remembering, or making decisions?: No Does the patient have difficulty walking or climbing stairs?: No Does the patient have difficulty dressing or bathing?: No Does the patient have difficulty doing errands alone such as visiting a doctor's office or shopping?: No    Assessment & Plan  1. Essential hypertension  - telmisartan (MICARDIS) 80 MG tablet; One daily  Dispense: 90 tablet; Refill: 1 - amLODipine (NORVASC) 10 MG tablet; Take 1 tablet (10 mg total) by mouth daily.  Dispense: 90 tablet; Refill: 1  2. Generalized anxiety disorder  Continue paxil   3. Atherosclerosis of native coronary artery of native heart without angina pectoris  On medical management   4. Vitamin D deficiency  Continue vitamin D   5. History of breast cancer  Keep follow up with oncologist   6. Mixed hyperlipidemia  On crestor, LDL goal below 70, discussed adding zetia but she wants to hold off for now   7. Hyperparathyroidism ([place])  Recheck level next visit

## 2020-01-01 ENCOUNTER — Ambulatory Visit: Payer: Self-pay

## 2020-01-01 NOTE — Telephone Encounter (Signed)
Pt. Reports she has had abdominal pain above her belly button for "a few months. " Comes and goes. Seems to be worse after she eats. No vomiting,diarrhea or constipation.Tylenol and TUMS help her pain. Appointment made for next week. Reason for Disposition . [1] MODERATE pain (e.g., interferes with normal activities) AND [2] pain comes and goes (cramps) AND [3] present > 24 hours  (Exception: pain with Vomiting or Diarrhea - see that Guideline)  Answer Assessment - Initial Assessment Questions 1. LOCATION: "Where does it hurt?"      Hurts above belly button 2. RADIATION: "Does the pain shoot anywhere else?" (e.g., chest, back)     Into sides at times 3. ONSET: "When did the pain begin?" (e.g., minutes, hours or days ago)      A couple of months ago 4. SUDDEN: "Gradual or sudden onset?"     Gradual 5. PATTERN "Does the pain come and go, or is it constant?"    - If constant: "Is it getting better, staying the same, or worsening?"      (Note: Constant means the pain never goes away completely; most serious pain is constant and it progresses)     - If intermittent: "How long does it last?" "Do you have pain now?"     (Note: Intermittent means the pain goes away completely between bouts)     Comes and goes 6. SEVERITY: "How bad is the pain?"  (e.g., Scale 1-10; mild, moderate, or severe)   - MILD (1-3): doesn't interfere with normal activities, abdomen soft and not tender to touch    - MODERATE (4-7): interferes with normal activities or awakens from sleep, tender to touch    - SEVERE (8-10): excruciating pain, doubled over, unable to do any normal activities      It varies 7. RECURRENT SYMPTOM: "Have you ever had this type of abdominal pain before?" If so, ask: "When was the last time?" and "What happened that time?"      Yes 8. CAUSE: "What do you think is causing the abdominal pain?"     Maybe an ulcer 9. RELIEVING/AGGRAVATING FACTORS: "What makes it better or worse?" (e.g., movement,  antacids, bowel movement)     Tylenol helped and TUMS 10. OTHER SYMPTOMS: "Has there been any vomiting, diarrhea, constipation, or urine problems?"       No 11. PREGNANCY: "Is there any chance you are pregnant?" "When was your last menstrual period?"       No  Protocols used: ABDOMINAL PAIN - [facility]

## 2020-01-01 NOTE — Telephone Encounter (Signed)
Please give patient call if any cancellations

## 2020-01-03 NOTE — Telephone Encounter (Signed)
Tried calling pt because we had a cancellation with Dr Ancil Boozer on this coming Friday. Pt did not answer.

## 2020-01-05 ENCOUNTER — Encounter: Payer: Self-pay | Admitting: Family Medicine

## 2020-01-05 ENCOUNTER — Other Ambulatory Visit: Payer: Self-pay

## 2020-01-05 ENCOUNTER — Ambulatory Visit: Payer: Medicare PPO | Admitting: Family Medicine

## 2020-01-05 VITALS — BP 124/92 | HR 94 | Temp 98.1°F | Resp 16 | Ht 64.0 in | Wt 192.4 lb

## 2020-01-05 DIAGNOSIS — R6881 Early satiety: Secondary | ICD-10-CM | POA: Diagnosis not present

## 2020-01-05 DIAGNOSIS — R1013 Epigastric pain: Secondary | ICD-10-CM

## 2020-01-05 DIAGNOSIS — R809 Proteinuria, unspecified: Secondary | ICD-10-CM | POA: Diagnosis not present

## 2020-01-05 DIAGNOSIS — R110 Nausea: Secondary | ICD-10-CM

## 2020-01-05 DIAGNOSIS — I10 Essential (primary) hypertension: Secondary | ICD-10-CM

## 2020-01-05 MED ORDER — ONDANSETRON HCL 4 MG PO TABS: 4.0000 mg | ORAL_TABLET | Freq: Three times a day (TID) | ORAL | 0 refills | Status: DC | PRN

## 2020-01-05 MED ORDER — OMEPRAZOLE 20 MG PO CPDR: 20.0000 mg | DELAYED_RELEASE_CAPSULE | Freq: Two times a day (BID) | ORAL | 1 refills | Status: DC

## 2020-01-05 NOTE — Progress Notes (Signed)
Name: Theresa Andrews   MRN: [id number]    DOB: 10-30-1945   Date:01/05/2020       Progress Note  Subjective  Chief Complaint  Chief Complaint  Patient presents with  . Abdominal Pain    with and without eating   . Nausea    HPI  Epigastric pain: she states symptoms started around Feb 2021. She is states symptoms are getting progressively worse. Initially mild nausea, she states she quit drinking back in April but symptoms are still present. She has early satiety , indigestion and bloating, pain is described as dull. She states recently had jalapeno poppers and had sharp pain and burning at that time. No vomiting, no weight loss, change in bowel movements or blood in stools. She has a history of cholecystectomy   Anxiety: she stopped taking paroxetine about 4 days ago because of the stomach problems, she still has anxiety but does not want to go back on back on medication because sometimes it affects her sleep at night and has nightmares. Explained unrelated since she never had those symptoms before. Consider going back on medication  HTN with  Microalbuminuria: discussed last labs, urine micro 100, continue Telmisartan and monitor for now.    Patient Active Problem List   Diagnosis Date Noted  . Bradycardia 10/04/2018  . Obesity (BMI 30-39.9) 05/12/2018  . Statin myopathy 05/12/2018  . Vitamin D deficiency 01/09/2018  . Hyperparathyroidism ([place]) 01/07/2018  . Carcinoma of overlapping sites of left breast in female, estrogen receptor positive ([place]) 03/27/2016  . Hypercalcemia 08/20/2015  . Diuretic-induced hypokalemia 04/29/2015  . Abnormal finding on thyroid function test 10/29/2014  . Anxiety disorder 10/29/2014  . Breast CA ([place]) 10/29/2014  . Atherosclerosis of coronary artery 10/29/2014  . Prediabetes 10/29/2014  . Gravida 1 10/29/2014  . Hyperlipidemia 10/29/2014  . Chronic cervical pain 10/29/2014  . Disorder of peripheral nervous system 10/29/2014  . At risk for falling  10/29/2014  . Neoplasm of skin 10/29/2014  . Benign essential HTN 04/25/2014    Past Surgical History:  Procedure Laterality Date  . ABDOMINAL HYSTERECTOMY    . BREAST BIOPSY Left 2008   neg  . BREAST BIOPSY Left 03/05/2010   invasive mammary carcinoma  . BREAST LUMPECTOMY Left 07/14/2010   INVASIVE MAMMARY CARCINOMA WITH PROMINENT INFILTRATIVE PATTERN, negative margins  . CHOLECYSTECTOMY    . COLONOSCOPY WITH PROPOFOL N/A 11/22/2015   Procedure: COLONOSCOPY WITH PROPOFOL;  Surgeon: Hulen Luster, MD;  Location: [facility] ENDOSCOPY;  Service: Gastroenterology;  Laterality: N/A;  . CORONARY ANGIOPLASTY WITH STENT PLACEMENT  2008    Family History  Problem Relation Age of Onset  . Breast cancer Sister 61  . Hypertension Mother   . Aneurysm Mother   . Heart attack Sister   . Birth defects Sister     Social History   Tobacco Use  . Smoking status: Former Smoker    Packs/day: 0.25    Years: 25.00    Pack years: 6.25    Types: Cigarettes    Quit date: 2008    Years since quitting: 13.4  . Smokeless tobacco: Never Used  . Tobacco comment: smoking cessation materials not required  Substance Use Topics  . Alcohol use: No    Comment: rare; holidays     Current Outpatient Medications:  .  amLODipine (NORVASC) 10 MG tablet, Take 1 tablet (10 mg total) by mouth daily., Disp: 90 tablet, Rfl: 1 .  aspirin 81 MG EC tablet, Take 81 mg by  mouth daily., Disp: , Rfl:  .  cholecalciferol (VITAMIN D) 1000 units tablet, Take 1,000 Units by mouth daily., Disp: , Rfl:  .  clindamycin-benzoyl peroxide (BENZACLIN) gel, Apply 1 application topically 2 (two) times daily. , Disp: , Rfl: 0 .  metoprolol succinate (TOPROL-XL) 25 MG 24 hr tablet, Take 0.5 tablets (12.5 mg total) by mouth daily., Disp: 45 tablet, Rfl: 3 .  rosuvastatin (CRESTOR) 5 MG tablet, Take 5 mg by mouth daily., Disp: , Rfl:  .  telmisartan (MICARDIS) 80 MG tablet, One daily, Disp: 90 tablet, Rfl: 1 .  tretinoin (RETIN-A) 0.025 %  cream, Apply topically at bedtime. , Disp: , Rfl: 4 .  omeprazole (PRILOSEC) 20 MG capsule, Take 1 capsule (20 mg total) by mouth 2 (two) times daily before a meal., Disp: 60 capsule, Rfl: 1 .  ondansetron (ZOFRAN) 4 MG tablet, Take 1 tablet (4 mg total) by mouth every 8 (eight) hours as needed for nausea or vomiting., Disp: 20 tablet, Rfl: 0  Allergies  Allergen Reactions  . Tetanus Toxoids Shortness Of Breath and Rash  . Shellfish Allergy     I personally reviewed active problem list, medication list, allergies, family history, social history, health maintenance with the patient/caregiver today.   ROS  Constitutional: Negative for fever or weight change.  Respiratory: Negative for cough and shortness of breath.   Cardiovascular: Negative for chest pain or palpitations.  Gastrointestinal: Positive  for abdominal pain, no bowel changes.  Musculoskeletal: Negative for gait problem or joint swelling.  Skin: Negative for rash.  Neurological: Negative for dizziness or headache.  No other specific complaints in a complete review of systems (except as listed in HPI above).  Objective  Vitals:   01/05/20 0912  BP: (!) 124/92  Pulse: 94  Resp: 16  Temp: 98.1 F (36.7 C)  TempSrc: Temporal  SpO2: 99%  Weight: 192 lb 6.4 oz (87.3 kg)  Height: 5\' 4"  (1.626 m)    Body mass index is 33.03 kg/m.  Physical Exam  Constitutional: Patient appears well-developed and well-nourished. Obese No distress.  HEENT: head atraumatic, normocephalic, pupils equal and reactive to light,  neck supple Cardiovascular: Normal rate, regular rhythm and normal heart sounds.  No murmur heard. No BLE edema. Pulmonary/Chest: Effort normal and breath sounds normal. No respiratory distress. Abdominal: Soft.  There is epigastric tenderness  Psychiatric: Patient has a normal mood and affect. behavior is normal. Judgment and thought content normal.  Recent Results (from the past 2160 hour(s))  Microalbumin /  creatinine urine ratio     Status: Abnormal   Collection Time: 11/14/19 10:05 AM  Result Value Ref Range   Creatinine, Urine 116 20 - 275 mg/dL   Microalb, Ur 11.9 mg/dL    Comment: Reference Range Not established    Microalb Creat Ratio 103 (H) <30 mcg/mg creat    Comment: . The ADA defines abnormalities in albumin excretion as follows: [place] Kitchen Category         Result (mcg/mg creatinine) . Normal                    <30 Microalbuminuria         30-299  Clinical albuminuria   > OR = 300 . The ADA recommends that at least two of three specimens collected within a 3-6 month period be abnormal before considering a patient to be within a diagnostic category.   COMPLETE METABOLIC PANEL WITH GFR     Status: Abnormal   Collection  Time: 11/14/19 10:05 AM  Result Value Ref Range   Glucose, Bld 135 (H) 65 - 99 mg/dL    Comment: .            Fasting reference interval . For someone without known diabetes, a glucose value >125 mg/dL indicates that they may have diabetes and this should be confirmed with a follow-up test. .    BUN 10 7 - 25 mg/dL   Creat 0.71 0.60 - 0.93 mg/dL    Comment: For patients >55 years of age, the reference limit for Creatinine is approximately 13% higher for people identified as African-American. .    GFR, Est Non African American 84 > OR = 60 mL/min/1.16m2   GFR, Est African American 98 > OR = 60 mL/min/1.24m2   BUN/Creatinine Ratio NOT APPLICABLE 6 - 22 (calc)   Sodium 140 135 - 146 mmol/L   Potassium 3.6 3.5 - 5.3 mmol/L   Chloride 105 98 - 110 mmol/L   CO2 25 20 - 32 mmol/L   Calcium 10.4 8.6 - 10.4 mg/dL   Total Protein 7.0 6.1 - 8.1 g/dL   Albumin 4.1 3.6 - 5.1 g/dL   Globulin 2.9 1.9 - 3.7 g/dL (calc)   AG Ratio 1.4 1.0 - 2.5 (calc)   Total Bilirubin 0.5 0.2 - 1.2 mg/dL   Alkaline phosphatase (APISO) 107 37 - 153 U/L   AST 18 10 - 35 U/L   ALT 19 6 - 29 U/L  CBC with Differential/Platelet     Status: Abnormal   Collection Time: 11/14/19 10:05  AM  Result Value Ref Range   WBC 5.9 3.8 - 10.8 Thousand/uL   RBC 5.00 3.80 - 5.10 Million/uL   Hemoglobin 15.1 11.7 - 15.5 g/dL   HCT 46.6 (H) 35 - 45 %   MCV 93.2 80.0 - 100.0 fL   MCH 30.2 27.0 - 33.0 pg   MCHC 32.4 32.0 - 36.0 g/dL   RDW 13.1 11.0 - 15.0 %   Platelets 213 140 - 400 Thousand/uL   MPV 11.4 7.5 - 12.5 fL   Neutro Abs 2,844 1,500 - 7,800 cells/uL   Lymphs Abs 2,030 850 - 3,900 cells/uL   Absolute Monocytes 578 200 - 950 cells/uL   Eosinophils Absolute 378 15 - 500 cells/uL   Basophils Absolute 71 0 - 200 cells/uL   Neutrophils Relative % 48.2 %   Total Lymphocyte 34.4 %   Monocytes Relative 9.8 %   Eosinophils Relative 6.4 %   Basophils Relative 1.2 %  Hepatitis C antibody     Status: None   Collection Time: 11/14/19 10:05 AM  Result Value Ref Range   Hepatitis C Ab NON-REACTIVE NON-REACTI   SIGNAL TO CUT-OFF 0.01 <1.00    Comment: . HCV antibody was non-reactive. There is no laboratory  evidence of HCV infection. . In most cases, no further action is required. However, if recent HCV exposure is suspected, a test for HCV RNA (test code [id number]) is suggested. . For additional information please refer to http://education.questdiagnostics.com/faq/FAQ22v1 (This link is being provided for informational/ educational purposes only.) .      PHQ2/9: Depression screen [facility] 2/9 01/05/2020 12/07/2019 11/07/2019 09/19/2019 05/17/2019  Decreased Interest 0 0 0 0 0  Down, Depressed, Hopeless 0 0 0 0 0  PHQ - 2 Score 0 0 0 0 0  Altered sleeping 0 0 0 - 0  Tired, decreased energy 0 0 0 - 0  Change in appetite 1 0 0 - 0  Feeling bad or failure about yourself  0 0 0 - 0  Trouble concentrating 0 0 0 - 0  Moving slowly or fidgety/restless 0 0 0 - 0  Suicidal thoughts 0 0 0 - 0  PHQ-9 Score 1 0 0 - 0  Difficult doing work/chores Not difficult at all Not difficult at all Not difficult at all - Not difficult at all  Some recent data might be hidden    phq 9 is  negative   Fall Risk: Fall Risk  01/05/2020 12/07/2019 11/07/2019 09/19/2019 05/17/2019  Falls in the past year? 0 0 0 0 0  Number falls in past yr: 0 0 0 0 0  Injury with Fall? - 0 0 0 0  Risk for fall due to : - - - No Fall Risks -  Follow up - Falls evaluation completed - Falls prevention discussed Falls evaluation completed     Functional Status Survey: Is the patient deaf or have difficulty hearing?: No Does the patient have difficulty concentrating, remembering, or making decisions?: No Does the patient have difficulty walking or climbing stairs?: No Does the patient have difficulty dressing or bathing?: No Does the patient have difficulty doing errands alone such as visiting a doctor's office or shopping?: No    Assessment & Plan  1. Early satiety  - H. pylori breath test - omeprazole (PRILOSEC) 20 MG capsule; Take 1 capsule (20 mg total) by mouth 2 (two) times daily before a meal.  Dispense: 60 capsule; Refill: 1  2. Epigastric pain  - H. pylori breath test - omeprazole (PRILOSEC) 20 MG capsule; Take 1 capsule (20 mg total) by mouth 2 (two) times daily before a meal.  Dispense: 60 capsule; Refill: 1  3. Microalbuminuria  On ARB  4. Essential hypertension  DBP slightly up, she is feeling nauseated   5. Nausea  - ondansetron (ZOFRAN) 4 MG tablet; Take 1 tablet (4 mg total) by mouth every 8 (eight) hours as needed for nausea or vomiting.  Dispense: 20 tablet; Refill: 0

## 2020-01-05 NOTE — Patient Instructions (Signed)
Food Choices for Gastroesophageal Reflux Disease, Adult When you have gastroesophageal reflux disease (GERD), the foods you eat and your eating habits are very important. Choosing the right foods can help ease the discomfort of GERD. Consider working with a diet and nutrition specialist (dietitian) to help you make healthy food choices. What general guidelines should I follow?  Eating plan  Choose healthy foods low in fat, such as fruits, vegetables, whole grains, low-fat dairy products, and lean meat, fish, and poultry.  Eat frequent, small meals instead of three large meals each day. Eat your meals slowly, in a relaxed setting. Avoid bending over or lying down until 2-3 hours after eating.  Limit high-fat foods such as fatty meats or fried foods.  Limit your intake of oils, butter, and shortening to less than 8 teaspoons each day.  Avoid the following: ? Foods that cause symptoms. These may be different for different people. Keep a food diary to keep track of foods that cause symptoms. ? Alcohol. ? Drinking large amounts of liquid with meals. ? Eating meals during the 2-3 hours before bed.  Cook foods using methods other than frying. This may include baking, grilling, or broiling. Lifestyle  Maintain a healthy weight. Ask your health care provider what weight is healthy for you. If you need to lose weight, work with your health care provider to do so safely.  Exercise for at least 30 minutes on 5 or more days each week, or as told by your health care provider.  Avoid wearing clothes that fit tightly around your waist and chest.  Do not use any products that contain nicotine or tobacco, such as cigarettes and e-cigarettes. If you need help quitting, ask your health care provider.  Sleep with the head of your bed raised. Use a wedge under the mattress or blocks under the bed frame to raise the head of the bed. What foods are not recommended? The items listed may not be a complete  list. Talk with your dietitian about what dietary choices are best for you. Grains Pastries or quick breads with added fat. French toast. Vegetables Deep fried vegetables. French fries. Any vegetables prepared with added fat. Any vegetables that cause symptoms. For some people this may include tomatoes and tomato products, chili peppers, onions and garlic, and horseradish. Fruits Any fruits prepared with added fat. Any fruits that cause symptoms. For some people this may include citrus fruits, such as oranges, grapefruit, pineapple, and lemons. Meats and other protein foods High-fat meats, such as fatty beef or pork, hot dogs, ribs, ham, sausage, salami and bacon. Fried meat or protein, including fried fish and fried chicken. Nuts and nut butters. Dairy Whole milk and chocolate milk. Sour cream. Cream. Ice cream. Cream cheese. Milk shakes. Beverages Coffee and tea, with or without caffeine. Carbonated beverages. Sodas. Energy drinks. Fruit juice made with acidic fruits (such as orange or grapefruit). Tomato juice. Alcoholic drinks. Fats and oils Butter. Margarine. Shortening. Ghee. Sweets and desserts Chocolate and cocoa. Donuts. Seasoning and other foods Pepper. Peppermint and spearmint. Any condiments, herbs, or seasonings that cause symptoms. For some people, this may include curry, hot sauce, or vinegar-based salad dressings. Summary  When you have gastroesophageal reflux disease (GERD), food and lifestyle choices are very important to help ease the discomfort of GERD.  Eat frequent, small meals instead of three large meals each day. Eat your meals slowly, in a relaxed setting. Avoid bending over or lying down until 2-3 hours after eating.  Limit high-fat   foods such as fatty meat or fried foods. This information is not intended to replace advice given to you by your health care provider. Make sure you discuss any questions you have with your health care provider. Document Revised:  11/03/2018 Document Reviewed: 07/14/2016 Elsevier Patient Education  2020 [employer] Inc.  

## 2020-01-08 ENCOUNTER — Telehealth: Payer: Self-pay | Admitting: Family Medicine

## 2020-01-08 ENCOUNTER — Ambulatory Visit: Payer: Self-pay

## 2020-01-08 LAB — H. PYLORI BREATH TEST: H. pylori Breath Test: NOT DETECTED

## 2020-01-08 NOTE — Telephone Encounter (Signed)
Patient states the "ometrazole" was making her sick and she's unable to continue taking the prescription.please advise

## 2020-01-08 NOTE — Telephone Encounter (Signed)
See Triage note dated 01/08/20.

## 2020-01-08 NOTE — Telephone Encounter (Signed)
Phone call to pt.  Reported she started taking Omeprazole on 6/11, and has had intermittent abd. Pain and nausea since started taking it.  Questioned pt. If the nausea and pain worsened from previous complaints of this.  Stated it did seem to be worse, with the Omeprazole.  Stated she took her 2nd dose at 5:30 PM last eve.  C/o abdominal pain being "moderate", and located just above the navel.  Took Tylenol, 2 tabs, approx. 8:00 PM last night.  Stated the pain went away and the nausea subsided.  Reported has not had any Omeprazole today.  Denied fever or chills.  Denied any vomiting.  Reported some loose stool when she was taking the Omeprazole, but now stool is more formed.  Stated "I feel wonderful now".  Stated she just wanted to make Dr. Ancil Boozer aware that she can't take the Omeprazole.  Care advice given; recommended pt. To stay hydrated, eat small portions of bland food; avoid spicy foods and acidic foods.  Verb. Understanding.  Enc. to call back if symptoms worsen.  Advised will send Triage note to PCP.       Reason for Disposition  [1] Caller has NON-URGENT medication question about med that PCP prescribed AND [2] triager unable to answer question  Answer Assessment - Initial Assessment Questions 1.   NAME of MEDICATION: "What medicine are you calling about?"     Omeparazole  2.   QUESTION: "What is your question?"    Questions if the Omeprazole is causing the nausea and abd. Pain  3.   PRESCRIBING HCP: "Who prescribed it?" Reason: if prescribed by specialist, call should be referred to that group.     Dr. Ancil Boozer  4. SYMPTOMS: "Do you have any symptoms?"     Denied nausea or abdominal pain today, but c/o of those sx's occurring within a short period of time, after taking the Omeprazole approx. 5:30 PM on 6/13.  Denied fever/ chills.  Denied vomiting.  Reported her stools started to become more loose when she was taking the Omeprazole.  Reported stools have become more solid now.  5. SEVERITY:  If symptoms are present, ask "Are they mild, moderate or severe?"     Moderate 6.  PREGNANCY:  "Is there any chance that you are pregnant?" "When was your last menstrual period?"     N/a  Protocols used: MEDICATION QUESTION CALL-A-AH  Blackwell, Traci D 52 minutes ago (2:38 PM)  TB   Patient states the "ometrazole" was making her sick and she's unable to continue taking the prescription.please advise

## 2020-01-09 ENCOUNTER — Ambulatory Visit: Payer: Self-pay | Admitting: Family Medicine

## 2020-01-10 ENCOUNTER — Other Ambulatory Visit: Payer: Self-pay | Admitting: Family Medicine

## 2020-01-10 DIAGNOSIS — R6881 Early satiety: Secondary | ICD-10-CM

## 2020-01-10 DIAGNOSIS — R1013 Epigastric pain: Secondary | ICD-10-CM

## 2020-01-10 NOTE — Telephone Encounter (Signed)
She feeling fine now. She took some nausea medication and she feels much better. She does not have the stomach pain anymore. She does not want to proceed with GI referral

## 2020-01-30 ENCOUNTER — Ambulatory Visit
Admission: RE | Admit: 2020-01-30 | Discharge: 2020-01-30 | Disposition: A | Discharge status: Home / Self Care | Payer: Medicare PPO | Source: Ambulatory Visit | Attending: Internal Medicine | Admitting: Internal Medicine

## 2020-01-30 DIAGNOSIS — Z1231 Encounter for screening mammogram for malignant neoplasm of breast: Secondary | ICD-10-CM | POA: Insufficient documentation

## 2020-01-30 DIAGNOSIS — Z853 Personal history of malignant neoplasm of breast: Secondary | ICD-10-CM | POA: Diagnosis not present

## 2020-01-31 ENCOUNTER — Inpatient Hospital Stay: Payer: Medicare PPO | Admitting: Internal Medicine

## 2020-01-31 ENCOUNTER — Ambulatory Visit (INDEPENDENT_AMBULATORY_CARE_PROVIDER_SITE_OTHER): Payer: Medicare PPO | Admitting: Gastroenterology

## 2020-01-31 ENCOUNTER — Other Ambulatory Visit: Payer: Self-pay

## 2020-01-31 ENCOUNTER — Inpatient Hospital Stay: Payer: Medicare PPO

## 2020-01-31 VITALS — BP 149/92 | HR 87 | Temp 97.9°F | Ht 64.0 in | Wt 186.0 lb

## 2020-01-31 DIAGNOSIS — Z86010 Personal history of colonic polyps: Secondary | ICD-10-CM

## 2020-01-31 DIAGNOSIS — G8929 Other chronic pain: Secondary | ICD-10-CM | POA: Diagnosis not present

## 2020-01-31 DIAGNOSIS — R1013 Epigastric pain: Secondary | ICD-10-CM | POA: Diagnosis not present

## 2020-01-31 MED ORDER — OMEPRAZOLE 40 MG PO CPDR
40.0000 mg | DELAYED_RELEASE_CAPSULE | Freq: Every day | ORAL | 0 refills | Status: DC
Start: 2020-01-31 — End: 2020-09-19

## 2020-01-31 NOTE — Addendum Note (Signed)
Addended by: Dorethea Clan on: 01/31/2020 10:30 AM   Modules accepted: Orders

## 2020-01-31 NOTE — Progress Notes (Signed)
Jonathon Bellows MD, MRCP(U.K) [address]  [place]  [place],  [address]  Main: [phone]  Fax: [phone]   Gastroenterology Consultation  Referring Provider:     Steele Sizer, MD Primary Care Physician:  Steele Sizer, MD Primary Gastroenterologist:  Dr. Jonathon Bellows  Reason for Consultation:     Abdominal pain        HPI:   Theresa Andrews is a 74 y.o. y/o female referred for consultation & management  by Dr. Ancil Boozer, Drue Stager, MD.    She has been referred for abdominal pain.  Prior history of colonic polyps.  Last colonoscopy was in 2017 and 1 tubular adenoma was resected.  Colonoscopy withdrawal time was only 4 minutes.  01/05/2020 H. pylori breath test negative  She states that she has had abdominal pain since March 2021.  Epigastric in location.  Episodic 2-3 times a week.  Each episode lasts a few minutes.  Dull in nature.  Nonradiating in.  Relieved by eating.  No aggravating features.  No change with a bowel movement.  She has been taking ibuprofen for many years.  Not been on a PPI.  She recently stopped taking ibuprofen and since has been feeling much better.  No other symptoms.  Past Medical History:  Diagnosis Date   Anxiety    Breast cancer ([place])    pT2 pN0 cM0 (stage IIA) invasive mammary carcinoma of left outer breast status post lumpectomy and sentinel node study on July 14, 2010   CAD (coronary artery disease)    Depression    H/O hypokalemia    History of chemotherapy    History of MI (myocardial infarction)    Hyperglycemia    Hyperlipidemia    Hypertension    Peripheral neuropathy due to chemotherapy [facility])    Personal history of chemotherapy 2011   left breast ca   Personal history of radiation therapy 2001   left breast ca    Past Surgical History:  Procedure Laterality Date   ABDOMINAL HYSTERECTOMY     BREAST BIOPSY Left 2008   neg   BREAST BIOPSY Left 03/05/2010   invasive mammary carcinoma   BREAST  LUMPECTOMY Left 07/14/2010   INVASIVE MAMMARY CARCINOMA WITH PROMINENT INFILTRATIVE PATTERN, negative margins   CHOLECYSTECTOMY     COLONOSCOPY WITH PROPOFOL N/A 11/22/2015   Procedure: COLONOSCOPY WITH PROPOFOL;  Surgeon: Hulen Luster, MD;  Location: [facility] ENDOSCOPY;  Service: Gastroenterology;  Laterality: N/A;   CORONARY ANGIOPLASTY WITH STENT PLACEMENT  2008    Prior to Admission medications   Medication Sig Start Date End Date Taking? Authorizing Provider  amLODipine (NORVASC) 10 MG tablet Take 1 tablet (10 mg total) by mouth daily. 12/07/19   Steele Sizer, MD  aspirin 81 MG EC tablet Take 81 mg by mouth daily.    Roselee Nova, MD  cholecalciferol (VITAMIN D) 1000 units tablet Take 1,000 Units by mouth daily.    [provider]  clindamycin-benzoyl peroxide (BENZACLIN) gel Apply 1 application topically 2 (two) times daily.  10/20/17   [provider]  metoprolol succinate (TOPROL-XL) 25 MG 24 hr tablet Take 0.5 tablets (12.5 mg total) by mouth daily. 05/17/19   Hubbard Hartshorn, FNP  omeprazole (PRILOSEC) 20 MG capsule Take 1 capsule (20 mg total) by mouth 2 (two) times daily before a meal. 01/05/20   Ancil Boozer, Drue Stager, MD  ondansetron (ZOFRAN) 4 MG tablet Take 1 tablet (4 mg total) by mouth every 8 (eight) hours as needed  for nausea or vomiting. 01/05/20   Steele Sizer, MD  rosuvastatin (CRESTOR) 5 MG tablet Take 5 mg by mouth daily. 10/04/18   Corey Skains, MD  telmisartan (MICARDIS) 80 MG tablet One daily 12/07/19   Steele Sizer, MD  tretinoin (RETIN-A) 0.025 % cream Apply topically at bedtime.  10/23/17   [provider]    Family History  Problem Relation Age of Onset   Breast cancer Sister 47   Hypertension Mother    Aneurysm Mother    Heart attack Sister    Birth defects Sister      Social History   Tobacco Use   Smoking status: Former Smoker    Packs/day: 0.25    Years: 25.00    Pack years: 6.25    Types: Cigarettes    Quit  date: 2008    Years since quitting: 13.5   Smokeless tobacco: Never Used   Tobacco comment: smoking cessation materials not required  Vaping Use   Vaping Use: Never used  Substance Use Topics   Alcohol use: No    Comment: rare; holidays   Drug use: No    Allergies as of 01/31/2020 - Review Complete 01/05/2020  Allergen Reaction Noted   Tetanus toxoids Shortness Of Breath and Rash 07/30/2017   Shellfish allergy  10/29/2014    Review of Systems:    All systems reviewed and negative except where noted in HPI.   Physical Exam:  There were no vitals taken for this visit. No LMP recorded. Patient has had a hysterectomy. Psych:  Alert and cooperative. Normal mood and affect. General:   Alert,  Well-developed, well-nourished, pleasant and cooperative in NAD Head:  Normocephalic and atraumatic. Eyes:  Sclera clear, no icterus.   Conjunctiva pink. Ears:  Normal auditory acuity. Lungs:  Respirations even and unlabored.  Clear throughout to auscultation.   No wheezes, crackles, or rhonchi. No acute distress. Heart:  Regular rate and rhythm; no murmurs, clicks, rubs, or gallops. Abdomen:  Normal bowel sounds.  No bruits.  Soft, non-tender and non-distended without masses, hepatosplenomegaly or hernias noted.  No guarding or rebound tenderness.    Neurologic:  Alert and oriented x3;  grossly normal neurologically. Psych:  Alert and cooperative. Normal mood and affect.  Imaging Studies: MM 3D SCREEN BREAST BILATERAL  Result Date: 01/30/2020 CLINICAL DATA:  Screening. EXAM: DIGITAL SCREENING BILATERAL MAMMOGRAM WITH TOMO AND CAD COMPARISON:  Previous exam(s). ACR Breast Density Category b: There are scattered areas of fibroglandular density. FINDINGS: There are no findings suspicious for malignancy. Images were processed with CAD. IMPRESSION: No mammographic evidence of malignancy. A result letter of this screening mammogram will be mailed directly to the patient. RECOMMENDATION:  Screening mammogram in one year. (Code:SM-B-01Y) BI-RADS CATEGORY  1: Negative. Electronically Signed   By: Audie Pinto M.D.   On: 01/30/2020 14:15    Assessment and Plan:   Theresa Andrews is a 74 y.o. y/o female has been referred for abdominal pain.  Based on her history it appears that she might have had a duodenal ulcer from NSAID usage.  She has recently stopped using long-term NSAIDs and feels significantly better.  Also noted personal history of colonic polyps.  Plan 1.  Personal history of colon polyps and last colonoscopy showed only 1 tubular adenoma but unfortunately colonoscopy withdrawal time was only 4 minutes, suboptimal.  Suggest to perform surveillance colonoscopy in a couple of months.  2.  Continue to stop NSAIDs and commence on Prilosec 40 mg a  day.  I will speak to her in 6 weeks if no better will also need EGD along with colonoscopy  I have discussed alternative options, risks & benefits,  which include, but are not limited to, bleeding, infection, perforation,respiratory complication & drug reaction.  The patient agrees with this plan & written consent will be obtained.    Follow up in 6 weeks telephone visit  Dr Jonathon Bellows MD,MRCP(U.K)

## 2020-02-04 ENCOUNTER — Encounter: Payer: Self-pay | Admitting: Family Medicine

## 2020-02-14 ENCOUNTER — Inpatient Hospital Stay: Payer: Medicare PPO | Attending: Internal Medicine

## 2020-02-14 ENCOUNTER — Inpatient Hospital Stay (HOSPITAL_BASED_OUTPATIENT_CLINIC_OR_DEPARTMENT_OTHER): Payer: Medicare PPO | Admitting: Internal Medicine

## 2020-02-14 ENCOUNTER — Encounter: Payer: Self-pay | Admitting: Internal Medicine

## 2020-02-14 ENCOUNTER — Other Ambulatory Visit: Payer: Self-pay

## 2020-02-14 VITALS — BP 133/89 | HR 78 | Temp 97.8°F | Resp 16 | Ht 64.0 in | Wt 182.0 lb

## 2020-02-14 DIAGNOSIS — Z6831 Body mass index (BMI) 31.0-31.9, adult: Secondary | ICD-10-CM | POA: Diagnosis not present

## 2020-02-14 DIAGNOSIS — Z79899 Other long term (current) drug therapy: Secondary | ICD-10-CM | POA: Diagnosis not present

## 2020-02-14 DIAGNOSIS — C50812 Malignant neoplasm of overlapping sites of left female breast: Secondary | ICD-10-CM | POA: Insufficient documentation

## 2020-02-14 DIAGNOSIS — Z9221 Personal history of antineoplastic chemotherapy: Secondary | ICD-10-CM | POA: Insufficient documentation

## 2020-02-14 DIAGNOSIS — Z923 Personal history of irradiation: Secondary | ICD-10-CM | POA: Diagnosis not present

## 2020-02-14 DIAGNOSIS — E785 Hyperlipidemia, unspecified: Secondary | ICD-10-CM | POA: Insufficient documentation

## 2020-02-14 DIAGNOSIS — Z170 Estrogen receptor positive status [ER+]: Secondary | ICD-10-CM | POA: Insufficient documentation

## 2020-02-14 DIAGNOSIS — Z7982 Long term (current) use of aspirin: Secondary | ICD-10-CM | POA: Insufficient documentation

## 2020-02-14 DIAGNOSIS — Z87891 Personal history of nicotine dependence: Secondary | ICD-10-CM | POA: Insufficient documentation

## 2020-02-14 DIAGNOSIS — Z803 Family history of malignant neoplasm of breast: Secondary | ICD-10-CM | POA: Diagnosis not present

## 2020-02-14 DIAGNOSIS — Z90710 Acquired absence of both cervix and uterus: Secondary | ICD-10-CM | POA: Diagnosis not present

## 2020-02-14 DIAGNOSIS — I252 Old myocardial infarction: Secondary | ICD-10-CM | POA: Diagnosis not present

## 2020-02-14 DIAGNOSIS — R1013 Epigastric pain: Secondary | ICD-10-CM

## 2020-02-14 DIAGNOSIS — I10 Essential (primary) hypertension: Secondary | ICD-10-CM | POA: Insufficient documentation

## 2020-02-14 DIAGNOSIS — I2510 Atherosclerotic heart disease of native coronary artery without angina pectoris: Secondary | ICD-10-CM | POA: Insufficient documentation

## 2020-02-14 LAB — CBC WITH DIFFERENTIAL/PLATELET
Abs Immature Granulocytes: 0.03 10*3/uL (ref 0.00–0.07)
Basophils Absolute: 0.1 10*3/uL (ref 0.0–0.1)
Basophils Relative: 1 %
Eosinophils Absolute: 1 10*3/uL — ABNORMAL HIGH (ref 0.0–0.5)
Eosinophils Relative: 12 %
HCT: 35.8 % — ABNORMAL LOW (ref 36.0–46.0)
Hemoglobin: 12.1 g/dL (ref 12.0–15.0)
Immature Granulocytes: 0 %
Lymphocytes Relative: 13 %
Lymphs Abs: 1.1 10*3/uL (ref 0.7–4.0)
MCH: 30.9 pg (ref 26.0–34.0)
MCHC: 33.8 g/dL (ref 30.0–36.0)
MCV: 91.3 fL (ref 80.0–100.0)
Monocytes Absolute: 0.8 10*3/uL (ref 0.1–1.0)
Monocytes Relative: 9 %
Neutro Abs: 5.6 10*3/uL (ref 1.7–7.7)
Neutrophils Relative %: 65 %
Platelets: 247 10*3/uL (ref 150–400)
RBC: 3.92 MIL/uL (ref 3.87–5.11)
RDW: 15.6 % — ABNORMAL HIGH (ref 11.5–15.5)
WBC: 8.6 10*3/uL (ref 4.0–10.5)
nRBC: 0 % (ref 0.0–0.2)

## 2020-02-14 LAB — COMPREHENSIVE METABOLIC PANEL
ALT: 22 U/L (ref 0–44)
AST: 29 U/L (ref 15–41)
Albumin: 3.5 g/dL (ref 3.5–5.0)
Alkaline Phosphatase: 150 U/L — ABNORMAL HIGH (ref 38–126)
Anion gap: 9 (ref 5–15)
BUN: 8 mg/dL (ref 8–23)
CO2: 25 mmol/L (ref 22–32)
Calcium: 9.7 mg/dL (ref 8.9–10.3)
Chloride: 106 mmol/L (ref 98–111)
Creatinine, Ser: 0.67 mg/dL (ref 0.44–1.00)
GFR calc Af Amer: >60 mL/min (ref >60)
GFR calc non Af Amer: >60 mL/min (ref >60)
Glucose, Bld: 133 mg/dL — ABNORMAL HIGH (ref 70–99)
Potassium: 3.6 mmol/L (ref 3.5–5.1)
Sodium: 140 mmol/L (ref 135–145)
Total Bilirubin: 0.8 mg/dL (ref 0.3–1.2)
Total Protein: 7.8 g/dL (ref 6.5–8.1)

## 2020-02-14 NOTE — Assessment & Plan Note (Addendum)
#  Left breast cancer stage II ER PR positive HER-2 negative; finished 5 years in May 2017.  Stable.  # On surveillance/ Clinically no evidence of recurrence.  July 2021- mammogram- wnl.   # Intermittent hypercalcemia- chronic [since 2016]; primary PTH; off HCTZ; ca- 10.2.  Stable.  # Abdominal pain/epigastric- on PPI- awiating EGD/ colo; recommend CT A/P ASAP.    # PN- G-1 on essential oils.  Stable  # DISPOSITION: will call with results # CT A/P ASAP- #  follow up in 12 months- MD; labs-cbc/cmp; ca-27-29;  [name] screening 2022 prior- Dr.B  # cc; Dr.Lada.

## 2020-02-14 NOTE — Progress Notes (Signed)
[facility] OFFICE PROGRESS NOTE  Patient Care Team: Steele Sizer, MD as PCP - General (Family Medicine) Cammie Sickle, MD as Consulting Physician (Oncology) Corey Skains, MD as Consulting Physician (Cardiology) Gabriel Carina Betsey Holiday, MD as Consulting Physician (Endocrinology) Baxter Kail, MD as Consulting Physician (Dermatology) Anell Barr, OD as Consulting Physician (Optometry) Pieter Partridge, MD as Referring Physician (Dermatology)  Cancer Staging No matching staging information was found for the patient.   Oncology History Overview Note  # AUG 2011- LEFT BREAST CA Stage II ER/PR- Pos; her 2 NEG; s/p neo-adj ACx4- poor response; s/p lumpec [Dr.Arru]- ypT2 [2.5cm]ypsN-0/2; Taxol weekly 9/12 [sec to PN]; finished march 2012. Arimidex-May 2012; Aug 2013- changed to Aug 2013; sep 2013- Started Tam; Mammo- in April 2017.   # Declines extended anti-hormone therapy.    # Feb 2017- Bone scan- Negative [hypercalcemia] ; mammogram May 2018-wnl.   # Hypercalcemia- chronic [2016]; primary hyperparathyrdoisim; improved after stopping HCTZ.    # PN- G-1  DIAGNOSIS: breast cancer  STAGE:  II       ;GOALS: cure  CURRENT/MOST RECENT THERAPY: surviellance    Breast CA ([place])  10/29/2014 Initial Diagnosis   Breast CA ([facility])   Carcinoma of overlapping sites of left breast in female, estrogen receptor positive ([place])      INTERVAL HISTORY:  Theresa Andrews 74 y.o.  female pleasant patient above history of ER PR positive stage II breast cancer is here for follow-up.   In the interim patient was evaluated by GI for epigastric discomfort.  Constant; improved with Pepto-Bismol.  No significant improvement with PPI.  She is currently awaiting repeat evaluation with GI/EGD and colonoscopy.  Complains of weight loss.  Unintentional.  No nausea no vomiting.  Patient's grandmother died of pancreatic cancer.  Review of Systems  Constitutional: Positive for weight loss.  Negative for chills, diaphoresis, fever and malaise/fatigue.  HENT: Negative for nosebleeds and sore throat.   Eyes: Negative for double vision.  Respiratory: Negative for cough, hemoptysis, sputum production, shortness of breath and wheezing.   Cardiovascular: Negative for chest pain, palpitations, orthopnea and leg swelling.  Gastrointestinal: Positive for abdominal pain. Negative for blood in stool, constipation, diarrhea, heartburn, melena, nausea and vomiting.  Genitourinary: Negative for dysuria, frequency and urgency.  Musculoskeletal: Negative for back pain and joint pain.  Skin: Negative.  Negative for itching and rash.  Neurological: Negative for dizziness, tingling, focal weakness, weakness and headaches.  Endo/Heme/Allergies: Does not bruise/bleed easily.  Psychiatric/Behavioral: Negative for depression. The patient is not nervous/anxious and does not have insomnia.       PAST MEDICAL HISTORY :  Past Medical History:  Diagnosis Date   Anxiety    Breast cancer ([place])    pT2 pN0 cM0 (stage IIA) invasive mammary carcinoma of left outer breast status post lumpectomy and sentinel node study on July 14, 2010   CAD (coronary artery disease)    Depression    H/O hypokalemia    History of chemotherapy    History of MI (myocardial infarction)    Hyperglycemia    Hyperlipidemia    Hypertension    Peripheral neuropathy due to chemotherapy [facility])    Personal history of chemotherapy 2011   left breast ca   Personal history of radiation therapy 2001   left breast ca    PAST SURGICAL HISTORY :   Past Surgical History:  Procedure Laterality Date   ABDOMINAL HYSTERECTOMY     BREAST BIOPSY Left 2008  neg   BREAST BIOPSY Left 03/05/2010   invasive mammary carcinoma   BREAST LUMPECTOMY Left 07/14/2010   INVASIVE MAMMARY CARCINOMA WITH PROMINENT INFILTRATIVE PATTERN, negative margins   CHOLECYSTECTOMY     COLONOSCOPY WITH PROPOFOL N/A 11/22/2015    Procedure: COLONOSCOPY WITH PROPOFOL;  Surgeon: Hulen Luster, MD;  Location: [facility] ENDOSCOPY;  Service: Gastroenterology;  Laterality: N/A;   CORONARY ANGIOPLASTY WITH STENT PLACEMENT  2008    FAMILY HISTORY :   Family History  Problem Relation Age of Onset   Breast cancer Sister 55   Hypertension Mother    Aneurysm Mother    Heart attack Sister    Birth defects Sister     SOCIAL HISTORY:   Social History   Tobacco Use   Smoking status: Former Smoker    Packs/day: 0.25    Years: 25.00    Pack years: 6.25    Types: Cigarettes    Quit date: 2008    Years since quitting: 13.5   Smokeless tobacco: Never Used   Tobacco comment: smoking cessation materials not required  Vaping Use   Vaping Use: Never used  Substance Use Topics   Alcohol use: No    Comment: rare; holidays   Drug use: No    ALLERGIES:  is allergic to tetanus toxoids and shellfish allergy.  MEDICATIONS:  Current Outpatient Medications  Medication Sig Dispense Refill   amLODipine (NORVASC) 10 MG tablet Take 1 tablet (10 mg total) by mouth daily. 90 tablet 1   aspirin 81 MG EC tablet Take 81 mg by mouth daily.     cholecalciferol (VITAMIN D) 1000 units tablet Take 1,000 Units by mouth daily.     clindamycin-benzoyl peroxide (BENZACLIN) gel Apply 1 application topically 2 (two) times daily.   0   metoprolol succinate (TOPROL-XL) 25 MG 24 hr tablet Take 0.5 tablets (12.5 mg total) by mouth daily. 45 tablet 3   omeprazole (PRILOSEC) 40 MG capsule Take 1 capsule (40 mg total) by mouth daily. 90 capsule 0   ondansetron (ZOFRAN) 4 MG tablet Take 1 tablet (4 mg total) by mouth every 8 (eight) hours as needed for nausea or vomiting. 20 tablet 0   rosuvastatin (CRESTOR) 5 MG tablet Take 5 mg by mouth daily.     telmisartan (MICARDIS) 80 MG tablet One daily 90 tablet 1   tretinoin (RETIN-A) 0.025 % cream Apply topically at bedtime.   4   No current facility-administered medications for this visit.     PHYSICAL EXAMINATION: ECOG PERFORMANCE STATUS: 0 - Asymptomatic  BP 133/89 (BP Location: Left Arm, Patient Position: Sitting, Cuff Size: Large)    Pulse 78    Temp 97.8 F (36.6 C) (Tympanic)    Resp 16    Ht 5' 4"  (1.626 m)    Wt 182 lb (82.6 kg)    SpO2 100%    BMI 31.24 kg/m   Filed Weights   02/14/20 1035  Weight: 182 lb (82.6 kg)   Physical Exam HENT:     Head: Normocephalic and atraumatic.     Mouth/Throat:     Pharynx: No oropharyngeal exudate.  Eyes:     Pupils: Pupils are equal, round, and reactive to light.  Cardiovascular:     Rate and Rhythm: Normal rate and regular rhythm.  Pulmonary:     Effort: Pulmonary effort is normal. No respiratory distress.     Breath sounds: Normal breath sounds. No wheezing.  Abdominal:     General: Bowel sounds are normal.  There is no distension.     Palpations: Abdomen is soft. There is no mass.     Tenderness: There is abdominal tenderness. There is no guarding or rebound.  Musculoskeletal:        General: No tenderness. Normal range of motion.     Cervical back: Normal range of motion and neck supple.  Skin:    General: Skin is warm.  Neurological:     Mental Status: She is alert and oriented to person, place, and time.  Psychiatric:        Mood and Affect: Affect normal.      LABORATORY DATA:  I have reviewed the data as listed    Component Value Date/Time   NA 140 02/14/2020 1008   NA 141 08/20/2015 1143   K 3.6 02/14/2020 1008   CL 106 02/14/2020 1008   CO2 25 02/14/2020 1008   GLUCOSE 133 (H) 02/14/2020 1008   GLUCOSE 134 (H) 03/01/2013 1414   BUN 8 02/14/2020 1008   BUN 12 08/20/2015 1143   CREATININE 0.67 02/14/2020 1008   CREATININE 0.71 11/14/2019 1005   CALCIUM 9.7 02/14/2020 1008   PROT 7.8 02/14/2020 1008   PROT 7.5 08/20/2015 1143   PROT 7.5 03/02/2014 1034   ALBUMIN 3.5 02/14/2020 1008   ALBUMIN 4.7 08/20/2015 1143   ALBUMIN 3.5 03/02/2014 1034   AST 29 02/14/2020 1008   AST 13 (L) 03/02/2014  1034   ALT 22 02/14/2020 1008   ALT 21 03/02/2014 1034   ALKPHOS 150 (H) 02/14/2020 1008   ALKPHOS 76 03/02/2014 1034   BILITOT 0.8 02/14/2020 1008   BILITOT 0.3 08/20/2015 1143   BILITOT 0.2 03/02/2014 1034   GFRNONAA >60 02/14/2020 1008   GFRNONAA 84 11/14/2019 1005   GFRAA >60 02/14/2020 1008   GFRAA 98 11/14/2019 1005    No results found for: SPEP, UPEP  Lab Results  Component Value Date   WBC 8.6 02/14/2020   NEUTROABS 5.6 02/14/2020   HGB 12.1 02/14/2020   HCT 35.8 (L) 02/14/2020   MCV 91.3 02/14/2020   PLT 247 02/14/2020      Chemistry      Component Value Date/Time   NA 140 02/14/2020 1008   NA 141 08/20/2015 1143   K 3.6 02/14/2020 1008   CL 106 02/14/2020 1008   CO2 25 02/14/2020 1008   BUN 8 02/14/2020 1008   BUN 12 08/20/2015 1143   CREATININE 0.67 02/14/2020 1008   CREATININE 0.71 11/14/2019 1005      Component Value Date/Time   CALCIUM 9.7 02/14/2020 1008   ALKPHOS 150 (H) 02/14/2020 1008   ALKPHOS 76 03/02/2014 1034   AST 29 02/14/2020 1008   AST 13 (L) 03/02/2014 1034   ALT 22 02/14/2020 1008   ALT 21 03/02/2014 1034   BILITOT 0.8 02/14/2020 1008   BILITOT 0.3 08/20/2015 1143   BILITOT 0.2 03/02/2014 1034       RADIOGRAPHIC STUDIES: I have personally reviewed the radiological images as listed and agreed with the findings in the report. No results found.   ASSESSMENT & PLAN:  Carcinoma of overlapping sites of left breast in female, estrogen receptor positive ([place]) # Left breast cancer stage II ER PR positive HER-2 negative; finished 5 years in May 2017.  Stable.  # On surveillance/ Clinically no evidence of recurrence.  July 2021- mammogram- wnl.   # Intermittent hypercalcemia- chronic [since 2016]; primary PTH; off HCTZ; ca- 10.2.  Stable.  # Abdominal pain/epigastric- on PPI-  awiating EGD/ colo; recommend CT A/P ASAP.    # PN- G-1 on essential oils.  Stable  # DISPOSITION: will call with results # CT A/P ASAP- #  follow up in 12  months- MD; labs-cbc/cmp; ca-27-29;  [name] screening 2022 prior- Dr.B  # cc; Dr.Lada.    Orders Placed This Encounter  Procedures   MM 3D SCREEN BREAST BILATERAL    Standing Status:   Future    Standing Expiration Date:   08/16/2021    Order Specific Question:   Reason for Exam (SYMPTOM  OR DIAGNOSIS REQUIRED)    Answer:   history of breast cancer    Order Specific Question:   Preferred imaging location?    Answer:   [facility]   CT ABDOMEN PELVIS W CONTRAST    Standing Status:   Future    Standing Expiration Date:   02/13/2021    Order Specific Question:   If indicated for the ordered procedure, I authorize the administration of contrast media per Radiology protocol    Answer:   Yes    Order Specific Question:   Preferred imaging location?    Answer:   [facility]    Order Specific Question:   Is Oral Contrast requested for this exam?    Answer:   Yes, Per Radiology protocol    Order Specific Question:   Radiology Contrast Protocol - do NOT remove file path    Answer:   [email or web address]   Comprehensive metabolic panel    Standing Status:   Future    Standing Expiration Date:   08/16/2021   CBC with Differential    Standing Status:   Future    Standing Expiration Date:   08/16/2021   Cancer antigen 27.29    Standing Status:   Future    Standing Expiration Date:   08/16/2021   All questions were answered. The patient knows to call the clinic with any problems, questions or concerns.      Cammie Sickle, MD 02/14/2020 7:18 PM

## 2020-02-15 LAB — CANCER ANTIGEN 27.29: CA 27.29: 16.6 U/mL (ref 0.0–38.6)

## 2020-02-19 ENCOUNTER — Other Ambulatory Visit: Payer: Self-pay

## 2020-02-19 ENCOUNTER — Ambulatory Visit
Admission: RE | Admit: 2020-02-19 | Discharge: 2020-02-19 | Disposition: A | Discharge status: Home / Self Care | Payer: Medicare PPO | Source: Ambulatory Visit | Attending: Internal Medicine | Admitting: Internal Medicine

## 2020-02-19 DIAGNOSIS — R1013 Epigastric pain: Secondary | ICD-10-CM | POA: Diagnosis present

## 2020-02-19 MED ORDER — IOHEXOL 300 MG/ML  SOLN
100.0000 mL | Freq: Once | INTRAMUSCULAR | Status: AC | PRN
  Administered 2020-02-19: 100 mL via INTRAVENOUS

## 2020-02-20 ENCOUNTER — Encounter: Payer: Self-pay | Admitting: Internal Medicine

## 2020-02-20 ENCOUNTER — Telehealth: Payer: Self-pay | Admitting: Internal Medicine

## 2020-02-20 DIAGNOSIS — K859 Acute pancreatitis without necrosis or infection, unspecified: Secondary | ICD-10-CM

## 2020-02-20 DIAGNOSIS — K852 Alcohol induced acute pancreatitis without necrosis or infection: Secondary | ICD-10-CM

## 2020-02-20 NOTE — Telephone Encounter (Signed)
On 7/26- spoke to pt re: results of the CT Ab/pelvis- ? Pancreatitis based on the CT scans; recommend lipase/amylase; CMP. Will discuss with Dr.Anna, GI. Will order MRCP ASAP  H/C- order- lipase/amylase; CMP. Schedule labs today.; Schedule MRCP ASAP.  Schedule appt with me 1-2 days after MRCP.

## 2020-02-20 NOTE — Addendum Note (Signed)
Addended by: Gloris Ham on: 02/20/2020 09:49 AM   Modules accepted: Orders

## 2020-02-20 NOTE — Telephone Encounter (Signed)
Theresa Andrews, please schedule pt's lab encounter today and the MRI-Abdomen with MRCP asap.

## 2020-02-22 ENCOUNTER — Inpatient Hospital Stay: Payer: Medicare PPO

## 2020-02-22 ENCOUNTER — Telehealth: Payer: Self-pay | Admitting: Internal Medicine

## 2020-02-22 ENCOUNTER — Other Ambulatory Visit: Payer: Self-pay

## 2020-02-22 DIAGNOSIS — K859 Acute pancreatitis without necrosis or infection, unspecified: Secondary | ICD-10-CM

## 2020-02-22 DIAGNOSIS — C50812 Malignant neoplasm of overlapping sites of left female breast: Secondary | ICD-10-CM | POA: Diagnosis not present

## 2020-02-22 LAB — COMPREHENSIVE METABOLIC PANEL
ALT: 28 U/L (ref 0–44)
AST: 34 U/L (ref 15–41)
Albumin: 3.5 g/dL (ref 3.5–5.0)
Alkaline Phosphatase: 148 U/L — ABNORMAL HIGH (ref 38–126)
Anion gap: 8 (ref 5–15)
BUN: 10 mg/dL (ref 8–23)
CO2: 27 mmol/L (ref 22–32)
Calcium: 9.8 mg/dL (ref 8.9–10.3)
Chloride: 105 mmol/L (ref 98–111)
Creatinine, Ser: 0.73 mg/dL (ref 0.44–1.00)
GFR calc Af Amer: >60 mL/min (ref >60)
GFR calc non Af Amer: >60 mL/min (ref >60)
Glucose, Bld: 137 mg/dL — ABNORMAL HIGH (ref 70–99)
Potassium: 4.1 mmol/L (ref 3.5–5.1)
Sodium: 140 mmol/L (ref 135–145)
Total Bilirubin: 0.7 mg/dL (ref 0.3–1.2)
Total Protein: 7.6 g/dL (ref 6.5–8.1)

## 2020-02-22 LAB — AMYLASE: Amylase: 506 U/L — ABNORMAL HIGH (ref 28–100)

## 2020-02-22 LAB — LIPASE, BLOOD: Lipase: 221 U/L — ABNORMAL HIGH (ref 11–51)

## 2020-02-22 NOTE — Telephone Encounter (Signed)
On 7/30 -spoke to patient regarding results of elevated lipase amylase-continues with acute pancreatitis; however no other electrolyte abnormalities noted.  Discussed regarding diet-modification/bland diet/plenty of fluids.  Patient's abdominal discomfort improving.  History of heavy alcohol use about 2 months ago; currently declines any active alcohol use. Plan MRI MRCP as planned  C-patient not aware of MRCP scheduling; please confirm with patient.

## 2020-02-23 ENCOUNTER — Other Ambulatory Visit: Payer: Self-pay

## 2020-02-23 NOTE — Telephone Encounter (Signed)
Nira Conn, can you call the patient with her MRI apts times. Thanks, - Jonne Ply, RN

## 2020-02-29 ENCOUNTER — Ambulatory Visit: Payer: Medicare PPO | Admitting: Family Medicine

## 2020-03-05 ENCOUNTER — Encounter: Payer: Self-pay | Admitting: *Deleted

## 2020-03-05 ENCOUNTER — Telehealth: Payer: Self-pay | Admitting: *Deleted

## 2020-03-05 DIAGNOSIS — K852 Alcohol induced acute pancreatitis without necrosis or infection: Secondary | ICD-10-CM

## 2020-03-05 NOTE — Telephone Encounter (Signed)
Received incoming call from MRI. Patient's order needs to be changed to with and without contrast. Per Dr. Rogue Bussing. New order placed per Dr. Rogue Bussing. I spoke with [facility] in [place]. The MRA has been approved by her insurance.

## 2020-03-07 ENCOUNTER — Ambulatory Visit
Admission: RE | Admit: 2020-03-07 | Discharge: 2020-03-07 | Disposition: A | Discharge status: Home / Self Care | Payer: Medicare PPO | Source: Ambulatory Visit | Attending: Internal Medicine | Admitting: Internal Medicine

## 2020-03-07 ENCOUNTER — Other Ambulatory Visit: Payer: Self-pay

## 2020-03-07 DIAGNOSIS — K852 Alcohol induced acute pancreatitis without necrosis or infection: Secondary | ICD-10-CM | POA: Insufficient documentation

## 2020-03-07 DIAGNOSIS — K859 Acute pancreatitis without necrosis or infection, unspecified: Secondary | ICD-10-CM | POA: Diagnosis not present

## 2020-03-07 DIAGNOSIS — K862 Cyst of pancreas: Secondary | ICD-10-CM | POA: Diagnosis not present

## 2020-03-07 DIAGNOSIS — K7689 Other specified diseases of liver: Secondary | ICD-10-CM | POA: Diagnosis not present

## 2020-03-07 DIAGNOSIS — K8689 Other specified diseases of pancreas: Secondary | ICD-10-CM | POA: Diagnosis not present

## 2020-03-07 MED ORDER — GADOBUTROL 1 MMOL/ML IV SOLN
8.0000 mL | Freq: Once | INTRAVENOUS | Status: AC | PRN
  Administered 2020-03-07: 8 mL via INTRAVENOUS

## 2020-03-08 ENCOUNTER — Telehealth: Payer: Self-pay | Admitting: Internal Medicine

## 2020-03-08 ENCOUNTER — Other Ambulatory Visit: Payer: Self-pay

## 2020-03-08 DIAGNOSIS — C50812 Malignant neoplasm of overlapping sites of left female breast: Secondary | ICD-10-CM

## 2020-03-08 NOTE — Telephone Encounter (Signed)
Discussed with Dr.Anna. Spoke to pt re: results of the MRI liver.  Recommend [facility] evaluation re: EUS/? Biliary stenting. Discussed with Drue Dun.   GB

## 2020-03-08 NOTE — Telephone Encounter (Signed)
Referral for EUS/ERCP with biopsies/possible stenting has been sent to [facility] for scheduling.

## 2020-03-08 NOTE — Telephone Encounter (Signed)
Spoke to Dr. Jimmye Norman plan.  Given the liver lesions recommend biopsy of the liver lesions.  Spoke to patient-recommend follow-up in the cancer center to discuss the treatment plan.   Schedule-appointment on 8/16; 9:15- MD- labs-cbc/cmp/Ca-19-9; lipase/amylase.  Patient was asked to come at 9:00 for labs.   T-please enter the labs.  GB

## 2020-03-11 ENCOUNTER — Inpatient Hospital Stay (HOSPITAL_BASED_OUTPATIENT_CLINIC_OR_DEPARTMENT_OTHER): Payer: Medicare PPO | Admitting: Internal Medicine

## 2020-03-11 ENCOUNTER — Other Ambulatory Visit: Payer: Self-pay

## 2020-03-11 ENCOUNTER — Encounter: Payer: Self-pay | Admitting: Internal Medicine

## 2020-03-11 ENCOUNTER — Inpatient Hospital Stay: Payer: Medicare PPO | Attending: Internal Medicine

## 2020-03-11 ENCOUNTER — Inpatient Hospital Stay: Payer: Medicare PPO

## 2020-03-11 DIAGNOSIS — Z170 Estrogen receptor positive status [ER+]: Secondary | ICD-10-CM

## 2020-03-11 DIAGNOSIS — I10 Essential (primary) hypertension: Secondary | ICD-10-CM | POA: Diagnosis not present

## 2020-03-11 DIAGNOSIS — C50812 Malignant neoplasm of overlapping sites of left female breast: Secondary | ICD-10-CM

## 2020-03-11 DIAGNOSIS — R978 Other abnormal tumor markers: Secondary | ICD-10-CM | POA: Diagnosis not present

## 2020-03-11 DIAGNOSIS — Z79899 Other long term (current) drug therapy: Secondary | ICD-10-CM | POA: Insufficient documentation

## 2020-03-11 DIAGNOSIS — K769 Liver disease, unspecified: Secondary | ICD-10-CM

## 2020-03-11 DIAGNOSIS — I81 Portal vein thrombosis: Secondary | ICD-10-CM

## 2020-03-11 LAB — COMPREHENSIVE METABOLIC PANEL
ALT: 21 U/L (ref 0–44)
AST: 28 U/L (ref 15–41)
Albumin: 3.3 g/dL — ABNORMAL LOW (ref 3.5–5.0)
Alkaline Phosphatase: 115 U/L (ref 38–126)
Anion gap: 9 (ref 5–15)
BUN: 13 mg/dL (ref 8–23)
CO2: 24 mmol/L (ref 22–32)
Calcium: 9.3 mg/dL (ref 8.9–10.3)
Chloride: 105 mmol/L (ref 98–111)
Creatinine, Ser: 0.81 mg/dL (ref 0.44–1.00)
GFR calc Af Amer: >60 mL/min (ref >60)
GFR calc non Af Amer: >60 mL/min (ref >60)
Glucose, Bld: 154 mg/dL — ABNORMAL HIGH (ref 70–99)
Potassium: 3.1 mmol/L — ABNORMAL LOW (ref 3.5–5.1)
Sodium: 138 mmol/L (ref 135–145)
Total Bilirubin: 1 mg/dL (ref 0.3–1.2)
Total Protein: 7.4 g/dL (ref 6.5–8.1)

## 2020-03-11 LAB — CBC WITH DIFFERENTIAL/PLATELET
Abs Immature Granulocytes: 0.03 10*3/uL (ref 0.00–0.07)
Basophils Absolute: 0.1 10*3/uL (ref 0.0–0.1)
Basophils Relative: 1 %
Eosinophils Absolute: 1 10*3/uL — ABNORMAL HIGH (ref 0.0–0.5)
Eosinophils Relative: 10 %
HCT: 36 % (ref 36.0–46.0)
Hemoglobin: 12 g/dL (ref 12.0–15.0)
Immature Granulocytes: 0 %
Lymphocytes Relative: 14 %
Lymphs Abs: 1.4 10*3/uL (ref 0.7–4.0)
MCH: 30 pg (ref 26.0–34.0)
MCHC: 33.3 g/dL (ref 30.0–36.0)
MCV: 90 fL (ref 80.0–100.0)
Monocytes Absolute: 0.9 10*3/uL (ref 0.1–1.0)
Monocytes Relative: 9 %
Neutro Abs: 6.4 10*3/uL (ref 1.7–7.7)
Neutrophils Relative %: 66 %
Platelets: 251 10*3/uL (ref 150–400)
RBC: 4 MIL/uL (ref 3.87–5.11)
RDW: 14.9 % (ref 11.5–15.5)
WBC: 9.7 10*3/uL (ref 4.0–10.5)
nRBC: 0 % (ref 0.0–0.2)

## 2020-03-11 LAB — AMYLASE: Amylase: 712 U/L — ABNORMAL HIGH (ref 28–100)

## 2020-03-11 MED ORDER — ENOXAPARIN SODIUM 120 MG/0.8ML ~~LOC~~ SOLN: 120.0000 mg | SUBCUTANEOUS | 0 refills | Status: DC

## 2020-03-11 MED ORDER — ENOXAPARIN SODIUM 120 MG/0.8ML ~~LOC~~ SOLN
120.0000 mg | Freq: Once | SUBCUTANEOUS | Status: AC
  Administered 2020-03-11: 120 mg via SUBCUTANEOUS
  Filled 2020-03-11: qty 0.8

## 2020-03-11 NOTE — Progress Notes (Signed)
[name] OFF[facility] PROGRESS NOTE  Patient Care Team: Steele Sizer, MD as PCP - General (Family Medicine) Cammie Sickle, MD as Consulting Physician (Oncology) Corey Skains, MD as Consulting Physician (Cardiology) Gabriel Carina Betsey Holiday, MD as Consulting Physician (Endocrinology) Baxter Kail, MD as Consulting Physician (Dermatology) Anell Barr, OD as Consulting Physician (Optometry) Pieter Partridge, MD as Referring Physician (Dermatology)  Cancer Staging No matching staging information was found for the patient.   Oncology History Overview Note  # AUG 2011- LEFT BREAST CA Stage II ER/PR- Pos; her 2 NEG; s/p neo-adj ACx4- poor response; s/p lumpec [Dr.Arru]- ypT2 [2.5cm]ypsN-0/2; Taxol weekly 9/12 [sec to PN]; finished march 2012. Arimidex-May 2012; Aug 2013- changed to Aug 2013; sep 2013- Started Tam; Mammo- in April 2017.   # Declines extended anti-hormone therapy.    # Feb 2017- Bone scan- Negative [hypercalcemia] ; mammogram May 2018-wnl.   # Hypercalcemia- chronic [2016]; primary hyperparathyrdoisim; improved after stopping HCTZ.    # PN- G-1  DIAGNOSIS: breast cancer  STAGE:  II       ;GOALS: cure  CURRENT/MOST RECENT THERAPY: surviellance    Breast CA ([place])  10/29/2014 Initial Diagnosis   Breast CA (HCC)   Carcinoma of overlapping sites of left breast in female, estrogen receptor positive ([place])      INTERVAL HISTORY:  Theresa Andrews 74 y.o.  female pleasant patient above history of ER PR positive stage II breast cancer; is here to follow-up on the results of the MRI abdomen ordered for abdominal pain.  Patient continues to have intermittent abdominal discomfort which comes and goes.  Denies any alcohol abuse.  States that she drank alcohol heavily about 3 months ago.  But none recently.  Poor appetite.  Positive for nausea no vomiting. Patient's grandmother died of pancreatic cancer.  Review of Systems  Constitutional: Positive for weight  loss. Negative for chills, diaphoresis, fever and malaise/fatigue.  HENT: Negative for nosebleeds and sore throat.   Eyes: Negative for double vision.  Respiratory: Negative for cough, hemoptysis, sputum production, shortness of breath and wheezing.   Cardiovascular: Negative for chest pain, palpitations, orthopnea and leg swelling.  Gastrointestinal: Positive for abdominal pain. Negative for blood in stool, constipation, diarrhea, heartburn, melena, nausea and vomiting.  Genitourinary: Negative for dysuria, frequency and urgency.  Musculoskeletal: Negative for back pain and joint pain.  Skin: Negative.  Negative for itching and rash.  Neurological: Negative for dizziness, tingling, focal weakness, weakness and headaches.  Endo/Heme/Allergies: Does not bruise/bleed easily.  Psychiatric/Behavioral: Negative for depression. The patient is not nervous/anxious and does not have insomnia.       PAST MEDICAL HISTORY :  Past Medical History:  Diagnosis Date  . Anxiety   . Breast cancer ([place])    pT2 pN0 cM0 (stage IIA) invasive mammary carcinoma of left outer breast status post lumpectomy and sentinel node study on July 14, 2010  . CAD (coronary artery disease)   . Depression   . H/O hypokalemia   . History of chemotherapy   . History of MI (myocardial infarction)   . Hyperglycemia   . Hyperlipidemia   . Hypertension   . Peripheral neuropathy due to chemotherapy ([place])   . Personal history of chemotherapy 2011   left breast ca  . Personal history of radiation therapy 2001   left breast ca    PAST SURGICAL HISTORY :   Past Surgical History:  Procedure Laterality Date  . ABDOMINAL HYSTERECTOMY    . BREAST BIOPSY  Left 2008   neg  . BREAST BIOPSY Left 03/05/2010   invasive mammary carcinoma  . BREAST LUMPECTOMY Left 07/14/2010   INVASIVE MAMMARY CARCINOMA WITH PROMINENT INFILTRATIVE PATTERN, negative margins  . CHOLECYSTECTOMY    . COLONOSCOPY WITH PROPOFOL N/A 11/22/2015    Procedure: COLONOSCOPY WITH PROPOFOL;  Surgeon: Hulen Luster, MD;  Location: [facility] ENDOSCOPY;  Service: Gastroenterology;  Laterality: N/A;  . CORONARY ANGIOPLASTY WITH STENT PLACEMENT  2008    FAMILY HISTORY :   Family History  Problem Relation Age of Onset  . Breast cancer Sister 69  . Hypertension Mother   . Aneurysm Mother   . Heart attack Sister   . Birth defects Sister     SOCIAL HISTORY:   Social History   Tobacco Use  . Smoking status: Former Smoker    Packs/day: 0.25    Years: 25.00    Pack years: 6.25    Types: Cigarettes    Quit date: 2008    Years since quitting: 13.6  . Smokeless tobacco: Never Used  . Tobacco comment: smoking cessation materials not required  Vaping Use  . Vaping Use: Never used  Substance Use Topics  . Alcohol use: No    Comment: rare; holidays  . Drug use: No    ALLERGIES:  is allergic to tetanus toxoids and shellfish allergy.  MEDICATIONS:  Current Outpatient Medications  Medication Sig Dispense Refill  . amLODipine (NORVASC) 10 MG tablet Take 1 tablet (10 mg total) by mouth daily. 90 tablet 1  . aspirin 81 MG EC tablet Take 81 mg by mouth daily.    . cholecalciferol (VITAMIN D) 1000 units tablet Take 1,000 Units by mouth daily.    . clindamycin-benzoyl peroxide (BENZACLIN) gel Apply 1 application topically 2 (two) times daily.   0  . metoprolol succinate (TOPROL-XL) 25 MG 24 hr tablet Take 0.5 tablets (12.5 mg total) by mouth daily. 45 tablet 3  . omeprazole (PRILOSEC) 40 MG capsule Take 1 capsule (40 mg total) by mouth daily. 90 capsule 0  . ondansetron (ZOFRAN) 4 MG tablet Take 1 tablet (4 mg total) by mouth every 8 (eight) hours as needed for nausea or vomiting. 20 tablet 0  . rosuvastatin (CRESTOR) 5 MG tablet Take 5 mg by mouth daily.    Marland Kitchen telmisartan (MICARDIS) 80 MG tablet One daily 90 tablet 1  . tretinoin (RETIN-A) 0.025 % cream Apply topically at bedtime.   4  . enoxaparin (LOVENOX) 120 MG/0.8ML injection Inject 0.8 mLs (120  mg total) into the skin daily. 12 mL 0   No current facility-administered medications for this visit.    PHYSICAL EXAMINATION: ECOG PERFORMANCE STATUS: 0 - Asymptomatic  BP 127/71 (BP Location: Left Arm, Patient Position: Sitting, Cuff Size: Large)   Pulse 86   Temp (!) 96.8 F (36 C) (Tympanic)   Resp 16   Ht '5\' 4"'$  (1.626 m)   Wt 179 lb (81.2 kg)   SpO2 100%   BMI 30.73 kg/m   Filed Weights   03/11/20 0856  Weight: 179 lb (81.2 kg)   Physical Exam HENT:     Head: Normocephalic and atraumatic.     Mouth/Throat:     Pharynx: No oropharyngeal exudate.  Eyes:     Pupils: Pupils are equal, round, and reactive to light.  Cardiovascular:     Rate and Rhythm: Normal rate and regular rhythm.  Pulmonary:     Effort: Pulmonary effort is normal. No respiratory distress.  Breath sounds: Normal breath sounds. No wheezing.  Abdominal:     General: Bowel sounds are normal. There is no distension.     Palpations: Abdomen is soft. There is no mass.     Tenderness: There is abdominal tenderness. There is no guarding or rebound.  Musculoskeletal:        General: No tenderness. Normal range of motion.     Cervical back: Normal range of motion and neck supple.  Skin:    General: Skin is warm.  Neurological:     Mental Status: She is alert and oriented to person, place, and time.  Psychiatric:        Mood and Affect: Affect normal.      LABORATORY DATA:  I have reviewed the data as listed    Component Value Date/Time   NA 138 03/11/2020 0846   NA 141 08/20/2015 1143   K 3.1 (L) 03/11/2020 0846   CL 105 03/11/2020 0846   CO2 24 03/11/2020 0846   GLUCOSE 154 (H) 03/11/2020 0846   GLUCOSE 134 (H) 03/01/2013 1414   BUN 13 03/11/2020 0846   BUN 12 08/20/2015 1143   CREATININE 0.81 03/11/2020 0846   CREATININE 0.71 11/14/2019 1005   CALCIUM 9.3 03/11/2020 0846   PROT 7.4 03/11/2020 0846   PROT 7.5 08/20/2015 1143   PROT 7.5 03/02/2014 1034   ALBUMIN 3.3 (L) 03/11/2020  0846   ALBUMIN 4.7 08/20/2015 1143   ALBUMIN 3.5 03/02/2014 1034   AST 28 03/11/2020 0846   AST 13 (L) 03/02/2014 1034   ALT 21 03/11/2020 0846   ALT 21 03/02/2014 1034   ALKPHOS 115 03/11/2020 0846   ALKPHOS 76 03/02/2014 1034   BILITOT 1.0 03/11/2020 0846   BILITOT 0.3 08/20/2015 1143   BILITOT 0.2 03/02/2014 1034   GFRNONAA >60 03/11/2020 0846   GFRNONAA 84 11/14/2019 1005   GFRAA >60 03/11/2020 0846   GFRAA 98 11/14/2019 1005    No results found for: SPEP, UPEP  Lab Results  Component Value Date   WBC 9.7 03/11/2020   NEUTROABS 6.4 03/11/2020   HGB 12.0 03/11/2020   HCT 36.0 03/11/2020   MCV 90.0 03/11/2020   PLT 251 03/11/2020      Chemistry      Component Value Date/Time   NA 138 03/11/2020 0846   NA 141 08/20/2015 1143   K 3.1 (L) 03/11/2020 0846   CL 105 03/11/2020 0846   CO2 24 03/11/2020 0846   BUN 13 03/11/2020 0846   BUN 12 08/20/2015 1143   CREATININE 0.81 03/11/2020 0846   CREATININE 0.71 11/14/2019 1005      Component Value Date/Time   CALCIUM 9.3 03/11/2020 0846   ALKPHOS 115 03/11/2020 0846   ALKPHOS 76 03/02/2014 1034   AST 28 03/11/2020 0846   AST 13 (L) 03/02/2014 1034   ALT 21 03/11/2020 0846   ALT 21 03/02/2014 1034   BILITOT 1.0 03/11/2020 0846   BILITOT 0.3 08/20/2015 1143   BILITOT 0.2 03/02/2014 1034       RADIOGRAPHIC STUDIES: I have personally reviewed the radiological images as listed and agreed with the findings in the report. No results found.   ASSESSMENT & PLAN:  Carcinoma of overlapping sites of left breast in female, estrogen receptor positive (HCC)  # cc; Dr.Lada.   Lesion of liver #Liver lesions noted; with multicystic lesions noted in the pancreas- concerning for malignancy versus infection/inflammation.  Discussed with Dr. Annie Main IR guided liver biopsy.  Discussed with Dr.  Henn-reviewed images-liver biopsy ordered.  We will also review at the tumor conference.  Check tumor markers.  #Portal vein  thrombosis-unclear etiology [await above work-up]; recommend 1.5 mg/kg subcu once a day.;  Patient needs to be off anticoagulation 24 hours prior to liver biopsy.  Lovenox teaching provided.  Bleeding precautions discussed.  #  Left breast cancer stage II ER PR positive HER-2 negative; finished 5 years in May 2017.  Clinically unlikely recurrent breast malignancy.  # PN- G-1 on essential oils.  Stable  # DISPOSITION:  # Lovenox teaching/  # US liver biopsy # follow up TBD- Dr.B  # I reviewed the blood work- with the patient in detail; also reviewed the imaging independently [as summarized above]; and with the patient in detail.     Orders Placed This Encounter  Procedures  . US BIOPSY (LIVER)    Standing Status:   Future    Standing Expiration Date:   03/11/2021    Scheduling Instructions:     Discussed with Dr.Henn on 8/16; approved the liver biopsy- GB    Order Specific Question:   Lab orders requested (DO NOT place separate lab orders, these will be automatically ordered during procedure specimen collection):    Answer:   Surgical Pathology    Order Specific Question:   Reason for Exam (SYMPTOM  OR DIAGNOSIS REQUIRED)    Answer:   liver lesions;    Order Specific Question:   Preferred imaging location?    Answer:   [facility]   All questions were answered. The patient knows to call the clinic with any problems, questions or concerns.      Cammie Sickle, MD 03/11/2020 1:07 PM

## 2020-03-11 NOTE — Assessment & Plan Note (Addendum)
#  Liver lesions noted; with multicystic lesions noted in the pancreas- concerning for malignancy versus infection/inflammation.  Discussed with Dr. Aaron Mose IR guided liver biopsy.  Discussed with Dr. Cardell Peach images-liver biopsy ordered.  We will also review at the tumor conference.  Check tumor markers.  #Portal vein thrombosis-unclear etiology [await above work-up]; recommend 1.5 mg/kg subcu once a day.;  Patient needs to be off anticoagulation 24 hours prior to liver biopsy.  Lovenox teaching provided.  Bleeding precautions discussed.  #  Left breast cancer stage II ER PR positive HER-2 negative; finished 5 years in May 2017.  Clinically unlikely recurrent breast malignancy.  # PN- G-1 on essential oils.  Stable  # DISPOSITION:  # Lovenox teaching/  # US liver biopsy # follow up TBD- Dr.B  # I reviewed the blood work- with the patient in detail; also reviewed the imaging independently [as summarized above]; and with the patient in detail.

## 2020-03-11 NOTE — Patient Instructions (Signed)
Enoxaparin injection °What is this medicine? °ENOXAPARIN (ee nox a PA rin) is used after knee, hip, or abdominal surgeries to prevent blood clotting. It is also used to treat existing blood clots in the lungs or in the veins. °This medicine may be used for other purposes; ask your health care provider or pharmacist if you have questions. °COMMON BRAND NAME(S): Lovenox °What should I tell my health care provider before I take this medicine? °They need to know if you have any of these conditions: °· bleeding disorders, hemorrhage, or hemophilia °· infection of the heart or heart valves °· kidney or liver disease °· previous stroke °· prosthetic heart valve °· recent surgery or delivery of a baby °· ulcer in the stomach or intestine, diverticulitis, or other bowel disease °· an unusual or allergic reaction to enoxaparin, heparin, pork or pork products, other medicines, foods, dyes, or preservatives °· pregnant or trying to get pregnant °· breast-feeding °How should I use this medicine? °This medicine is for injection under the skin. It is usually given by a health-care professional. You or a family member may be trained on how to give the injections. If you are to give yourself injections, make sure you understand how to use the syringe, measure the dose if necessary, and give the injection. To avoid bruising, do not rub the site where this medicine has been injected. Do not take your medicine more often than directed. Do not stop taking except on the advice of your doctor or health care professional. °Make sure you receive a puncture-resistant container to dispose of the needles and syringes once you have finished with them. Do not reuse these items. Return the container to your doctor or health care professional for proper disposal. °Talk to your pediatrician regarding the use of this medicine in children. Special care may be needed. °Overdosage: If you think you have taken too much of this medicine contact a poison  control center or emergency room at once. °NOTE: This medicine is only for you. Do not share this medicine with others. °What if I miss a dose? °If you miss a dose, take it as soon as you can. If it is almost time for your next dose, take only that dose. Do not take double or extra doses. °What may interact with this medicine? °· aspirin and aspirin-like medicines °· certain medicines that treat or prevent blood clots °· dipyridamole °· NSAIDs, medicines for pain and inflammation, like ibuprofen or naproxen °This list may not describe all possible interactions. Give your health care provider a list of all the medicines, herbs, non-prescription drugs, or dietary supplements you use. Also tell them if you smoke, drink alcohol, or use illegal drugs. Some items may interact with your medicine. °What should I watch for while using this medicine? °Visit your healthcare professional for regular checks on your progress. You may need blood work done while you are taking this medicine. Your condition will be monitored carefully while you are receiving this medicine. It is important not to miss any appointments. °If you are going to need surgery or other procedure, tell your healthcare professional that you are using this medicine. °Using this medicine for a long time may weaken your bones and increase the risk of bone fractures. °Avoid sports and activities that might cause injury while you are using this medicine. Severe falls or injuries can cause unseen bleeding. Be careful when using sharp tools or knives. Consider using an electric razor. Take special care brushing or flossing your   teeth. Report any injuries, bruising, or red spots on the skin to your healthcare professional. °Wear a medical ID bracelet or chain. Carry a card that describes your disease and details of your medicine and dosage times. °What side effects may I notice from receiving this medicine? °Side effects that you should report to your doctor or health  care professional as soon as possible: °· allergic reactions like skin rash, itching or hives, swelling of the face, lips, or tongue °· bone pain °· signs and symptoms of bleeding such as bloody or black, tarry stools; red or dark-brown urine; spitting up blood or brown material that looks like coffee grounds; red spots on the skin; unusual bruising or bleeding from the eye, gums, or nose °· signs and symptoms of a blood clot such as chest pain; shortness of breath; pain, swelling, or warmth in the leg °· signs and symptoms of a stroke such as changes in vision; confusion; trouble speaking or understanding; severe headaches; sudden numbness or weakness of the face, arm or leg; trouble walking; dizziness; loss of coordination °Side effects that usually do not require medical attention (report to your doctor or health care professional if they continue or are bothersome): °· hair loss °· pain, redness, or irritation at site where injected °This list may not describe all possible side effects. Call your doctor for medical advice about side effects. You may report side effects to FDA at 1-800-FDA-1088. °Where should I keep my medicine? °Keep out of the reach of children. °Store at room temperature between 15 and 30 degrees C (59 and 86 degrees F). Do not freeze. If your injections have been specially prepared, you may need to store them in the refrigerator. Ask your pharmacist. Throw away any unused medicine after the expiration date. °NOTE: This sheet is a summary. It may not cover all possible information. If you have questions about this medicine, talk to your doctor, pharmacist, or health care provider. °© 2020 [employer] (2017-07-08 11:25:34) ° °

## 2020-03-11 NOTE — Telephone Encounter (Signed)
Labs ordered and appointments made.

## 2020-03-11 NOTE — Assessment & Plan Note (Addendum)
°#   cc; Dr.Lada.

## 2020-03-12 ENCOUNTER — Telehealth: Payer: Self-pay | Admitting: *Deleted

## 2020-03-12 LAB — CANCER ANTIGEN 19-9: CA 19-9: 41 U/mL — ABNORMAL HIGH (ref 0–35)

## 2020-03-12 NOTE — Telephone Encounter (Signed)
Contacted patient. Reviewed instructions for liver biopsy "Please arrive 60 minutes prior to appointment time. Also, please do not eat or drink anything after midnight (6-8 hours NPO) except blood pressure/heart/seizure medications. Please take with just a sip of water."  Restart lovenox on Friday per Dr. Rogue Bussing

## 2020-03-13 ENCOUNTER — Other Ambulatory Visit: Payer: Self-pay | Admitting: Student

## 2020-03-13 NOTE — Progress Notes (Signed)
Patient on schedule for Liver Biopsy 8/19/2021m spoke with patient on phone with pre procedure instructions given. Made aware to be here @ 0730, NPO after Mn, holding Lovenox after 8/17, and driver for discharge post recovery. Stated understanding.

## 2020-03-14 ENCOUNTER — Ambulatory Visit
Admission: RE | Admit: 2020-03-14 | Discharge: 2020-03-14 | Disposition: A | Discharge status: Home / Self Care | Payer: Medicare PPO | Source: Ambulatory Visit | Attending: Internal Medicine | Admitting: Internal Medicine

## 2020-03-14 ENCOUNTER — Other Ambulatory Visit: Payer: Self-pay

## 2020-03-14 ENCOUNTER — Telehealth: Payer: Self-pay | Admitting: *Deleted

## 2020-03-14 ENCOUNTER — Other Ambulatory Visit: Payer: Self-pay | Admitting: Internal Medicine

## 2020-03-14 DIAGNOSIS — K769 Liver disease, unspecified: Secondary | ICD-10-CM

## 2020-03-14 DIAGNOSIS — C50812 Malignant neoplasm of overlapping sites of left female breast: Secondary | ICD-10-CM | POA: Insufficient documentation

## 2020-03-14 DIAGNOSIS — C787 Secondary malignant neoplasm of liver and intrahepatic bile duct: Secondary | ICD-10-CM | POA: Diagnosis not present

## 2020-03-14 DIAGNOSIS — K7689 Other specified diseases of liver: Secondary | ICD-10-CM | POA: Diagnosis not present

## 2020-03-14 DIAGNOSIS — Z170 Estrogen receptor positive status [ER+]: Secondary | ICD-10-CM | POA: Insufficient documentation

## 2020-03-14 DIAGNOSIS — C259 Malignant neoplasm of pancreas, unspecified: Secondary | ICD-10-CM | POA: Diagnosis not present

## 2020-03-14 LAB — CBC
HCT: 35.6 % — ABNORMAL LOW (ref 36.0–46.0)
Hemoglobin: 11.8 g/dL — ABNORMAL LOW (ref 12.0–15.0)
MCH: 30.7 pg (ref 26.0–34.0)
MCHC: 33.1 g/dL (ref 30.0–36.0)
MCV: 92.7 fL (ref 80.0–100.0)
Platelets: 248 10*3/uL (ref 150–400)
RBC: 3.84 MIL/uL — ABNORMAL LOW (ref 3.87–5.11)
RDW: 14.9 % (ref 11.5–15.5)
WBC: 7.4 10*3/uL (ref 4.0–10.5)
nRBC: 0 % (ref 0.0–0.2)

## 2020-03-14 LAB — PROTIME-INR
INR: 1.1 (ref 0.8–1.2)
Prothrombin Time: 13.5 seconds (ref 11.4–15.2)

## 2020-03-14 MED ORDER — FENTANYL CITRATE (PF) 100 MCG/2ML IJ SOLN
INTRAMUSCULAR | Status: AC | PRN
  Administered 2020-03-14: 25 ug via INTRAVENOUS

## 2020-03-14 MED ORDER — FENTANYL CITRATE (PF) 100 MCG/2ML IJ SOLN
INTRAMUSCULAR | Status: AC
  Filled 2020-03-14: qty 4

## 2020-03-14 MED ORDER — HYDROCODONE-ACETAMINOPHEN 5-325 MG PO TABS: 1.0000 | ORAL_TABLET | ORAL | Status: DC | PRN

## 2020-03-14 MED ORDER — MIDAZOLAM HCL 5 MG/5ML IJ SOLN
INTRAMUSCULAR | Status: AC | PRN
  Administered 2020-03-14: 0.5 mg via INTRAVENOUS

## 2020-03-14 MED ORDER — SODIUM CHLORIDE 0.9 % IV SOLN: INTRAVENOUS | Status: DC

## 2020-03-14 MED ORDER — MIDAZOLAM HCL 5 MG/5ML IJ SOLN
INTRAMUSCULAR | Status: AC
  Filled 2020-03-14: qty 5

## 2020-03-14 MED ORDER — FENTANYL CITRATE (PF) 100 MCG/2ML IJ SOLN
INTRAMUSCULAR | Status: AC | PRN
Start: 2020-03-14 — End: 2020-03-14
  Administered 2020-03-14: 50 ug via INTRAVENOUS

## 2020-03-14 MED ORDER — MIDAZOLAM HCL 5 MG/5ML IJ SOLN
INTRAMUSCULAR | Status: AC | PRN
  Administered 2020-03-14: 1 mg via INTRAVENOUS

## 2020-03-14 NOTE — Procedures (Signed)
  Procedure: CT core biopsy liver lesion   EBL:   minimal Complications:  none immediate  See full dictation in [facility].  Dillard Cannon MD Main # [phone] Pager  [phone]

## 2020-03-14 NOTE — Telephone Encounter (Signed)
Patient in u/s. Radiologist needs to change the order to ct guided liver biopsy. Verbal Order entered from Dr. Rogue Bussing

## 2020-03-14 NOTE — Discharge Instructions (Signed)
Liver Biopsy, Care After These instructions give you information about how to care for yourself after your procedure. Your health care provider may also give you more specific instructions. If you have problems or questions, contact your health care provider. What can I expect after the procedure? After your procedure, it is common to have:  Pain and soreness in the area where the biopsy was done.  Bruising around the area where the biopsy was done.  Sleepiness and fatigue for 1-2 days. Follow these instructions at home: Medicines  Take over-the-counter and prescription medicines only as told by your health care provider.  If you were prescribed an antibiotic medicine, take it as told by your health care provider. Do not stop taking the antibiotic even if you start to feel better.  Do not take medicines such as aspirin and ibuprofen unless your health care provider tells you to take them. These medicines thin your blood and can increase the risk of bleeding.  If you are taking prescription pain medicine, take actions to prevent or treat constipation. Your health care provider may recommend that you: ? Drink enough fluid to keep your urine pale yellow. ? Eat foods that are high in fiber, such as fresh fruits and vegetables, whole grains, and beans. ? Limit foods that are high in fat and processed sugars, such as fried or sweet foods. ? Take an over-the-counter or prescription medicine for constipation. Incision care  Follow instructions from your health care provider about how to take care of your incision. Make sure you: ? Wash your hands with soap and water before you change your bandage (dressing). If soap and water are not available, use hand sanitizer. ? Change your dressing as told by your health care provider. ? Leave stitches (sutures), skin glue, or adhesive strips in place. These skin closures may need to stay in place for 2 weeks or longer. If adhesive strip edges start to  loosen and curl up, you may trim the loose edges. Do not remove adhesive strips completely unless your health care provider tells you to do that.  Check your incision area every day for signs of infection. Check for: ? Redness, swelling, or pain. ? Fluid or blood. ? Warmth. ? Pus or a bad smell.  Do not take baths, swim, or use a hot tub until your health care provider says it is okay to do so. Activity   Rest at home for 1-2 days, or as directed by your health care provider. ? Avoid sitting for a long time without moving. Get up to take short walks every 1-2 hours. This is important to improve blood flow and breathing. Ask for help if you feel weak or unsteady.  Return to your normal activities as told by your health care provider. Ask your health care provider what activities are safe for you.  Do not drive or use heavy machinery while taking prescription pain medicine.  Do not lift anything that is heavier than 10 lb (4.5 kg), or the limit that your health care provider tells you, until he or she says that it is safe.  Do not play contact sports for 2 weeks after the procedure. General instructions   Do not drink alcohol in the first week after the procedure.  Have someone stay with you for at least 24 hours after the procedure.  It is your responsibility to obtain your test results. Ask your health care provider, or the department that is doing the test: ? When will my   results be ready? ? How will I get my results? ? What are my treatment options? ? What other tests do I need? ? What are my next steps?  Keep all follow-up visits as told by your health care provider. This is important. Contact a health care provider if:  You have increased bleeding from an incision, resulting in more than a small spot of blood.  You have redness, swelling, or increasing pain in any incisions.  You notice a discharge or a bad smell coming from any of your incisions.  You have a fever or  chills. Get help right away if:  You develop swelling, bloating, or pain in your abdomen.  You become dizzy or faint.  You develop a rash.  You have nausea or you vomit.  You faint, or you have shortness of breath or difficulty breathing.  You develop chest pain.  You have problems with your speech or vision.  You have trouble with your balance or moving your arms or legs. Summary  After the liver biopsy, it is common to have pain, soreness, and bruising in the area, as well as sleepiness and fatigue.  Take over-the-counter and prescription medicines only as told by your health care provider.  Follow instructions from your health care provider about how to care for your incision. Check the incision area daily for signs of infection. This information is not intended to replace advice given to you by your health care provider. Make sure you discuss any questions you have with your health care provider. Document Revised: 09/05/2018 Document Reviewed: 07/23/2017 Elsevier Patient Education  2020 [employer]   Moderate Conscious Sedation, Adult, Care After These instructions provide you with information about caring for yourself after your procedure. Your health care provider may also give you more specific instructions. Your treatment has been planned according to current medical practices, but problems sometimes occur. Call your health care provider if you have any problems or questions after your procedure. What can I expect after the procedure? After your procedure, it is common:  To feel sleepy for several hours.  To feel clumsy and have poor balance for several hours.  To have poor judgment for several hours.  To vomit if you eat too soon. Follow these instructions at home: For at least 24 hours after the procedure:   Do not: ? Participate in activities where you could fall or become injured. ? Drive. ? Use heavy machinery. ? Drink alcohol. ? Take sleeping pills  or medicines that cause drowsiness. ? Make important decisions or sign legal documents. ? Take care of children on your own.  Rest. Eating and drinking  Follow the diet recommended by your health care provider.  If you vomit: ? Drink water, juice, or soup when you can drink without vomiting. ? Make sure you have little or no nausea before eating solid foods. General instructions  Have a responsible adult stay with you until you are awake and alert.  Take over-the-counter and prescription medicines only as told by your health care provider.  If you smoke, do not smoke without supervision.  Keep all follow-up visits as told by your health care provider. This is important. Contact a health care provider if:  You keep feeling nauseous or you keep vomiting.  You feel light-headed.  You develop a rash.  You have a fever. Get help right away if:  You have trouble breathing. This information is not intended to replace advice given to you by your health care   provider. Make sure you discuss any questions you have with your health care provider. Document Revised: 06/25/2017 Document Reviewed: 11/02/2015 Elsevier Patient Education  2020 [employer]  

## 2020-03-20 ENCOUNTER — Other Ambulatory Visit: Payer: Self-pay

## 2020-03-20 ENCOUNTER — Inpatient Hospital Stay (HOSPITAL_BASED_OUTPATIENT_CLINIC_OR_DEPARTMENT_OTHER): Payer: Medicare PPO | Admitting: Oncology

## 2020-03-20 ENCOUNTER — Inpatient Hospital Stay: Payer: Medicare PPO

## 2020-03-20 ENCOUNTER — Encounter: Payer: Self-pay | Admitting: Internal Medicine

## 2020-03-20 ENCOUNTER — Other Ambulatory Visit: Payer: Self-pay | Admitting: *Deleted

## 2020-03-20 VITALS — BP 132/82 | HR 80 | Resp 18

## 2020-03-20 VITALS — BP 154/90 | HR 96 | Temp 96.9°F | Resp 18 | Wt 174.4 lb

## 2020-03-20 DIAGNOSIS — C50812 Malignant neoplasm of overlapping sites of left female breast: Secondary | ICD-10-CM | POA: Diagnosis not present

## 2020-03-20 DIAGNOSIS — K769 Liver disease, unspecified: Secondary | ICD-10-CM

## 2020-03-20 DIAGNOSIS — K852 Alcohol induced acute pancreatitis without necrosis or infection: Secondary | ICD-10-CM | POA: Diagnosis not present

## 2020-03-20 DIAGNOSIS — R112 Nausea with vomiting, unspecified: Secondary | ICD-10-CM

## 2020-03-20 DIAGNOSIS — I10 Essential (primary) hypertension: Secondary | ICD-10-CM | POA: Diagnosis not present

## 2020-03-20 DIAGNOSIS — I81 Portal vein thrombosis: Secondary | ICD-10-CM

## 2020-03-20 DIAGNOSIS — R978 Other abnormal tumor markers: Secondary | ICD-10-CM | POA: Diagnosis not present

## 2020-03-20 DIAGNOSIS — Z170 Estrogen receptor positive status [ER+]: Secondary | ICD-10-CM

## 2020-03-20 DIAGNOSIS — R1013 Epigastric pain: Secondary | ICD-10-CM | POA: Diagnosis not present

## 2020-03-20 DIAGNOSIS — Z853 Personal history of malignant neoplasm of breast: Secondary | ICD-10-CM | POA: Diagnosis not present

## 2020-03-20 DIAGNOSIS — Z79899 Other long term (current) drug therapy: Secondary | ICD-10-CM | POA: Diagnosis not present

## 2020-03-20 LAB — CBC WITH DIFFERENTIAL/PLATELET
Abs Immature Granulocytes: 0.05 10*3/uL (ref 0.00–0.07)
Basophils Absolute: 0.1 10*3/uL (ref 0.0–0.1)
Basophils Relative: 0 %
Eosinophils Absolute: 0.3 10*3/uL (ref 0.0–0.5)
Eosinophils Relative: 2 %
HCT: 34.2 % — ABNORMAL LOW (ref 36.0–46.0)
Hemoglobin: 11.5 g/dL — ABNORMAL LOW (ref 12.0–15.0)
Immature Granulocytes: 0 %
Lymphocytes Relative: 8 %
Lymphs Abs: 0.9 10*3/uL (ref 0.7–4.0)
MCH: 30 pg (ref 26.0–34.0)
MCHC: 33.6 g/dL (ref 30.0–36.0)
MCV: 89.3 fL (ref 80.0–100.0)
Monocytes Absolute: 0.6 10*3/uL (ref 0.1–1.0)
Monocytes Relative: 5 %
Neutro Abs: 9.7 10*3/uL — ABNORMAL HIGH (ref 1.7–7.7)
Neutrophils Relative %: 85 %
Platelets: 286 10*3/uL (ref 150–400)
RBC: 3.83 MIL/uL — ABNORMAL LOW (ref 3.87–5.11)
RDW: 15.3 % (ref 11.5–15.5)
WBC: 11.6 10*3/uL — ABNORMAL HIGH (ref 4.0–10.5)
nRBC: 0 % (ref 0.0–0.2)

## 2020-03-20 LAB — COMPREHENSIVE METABOLIC PANEL
ALT: 160 U/L — ABNORMAL HIGH (ref 0–44)
AST: 154 U/L — ABNORMAL HIGH (ref 15–41)
Albumin: 3.5 g/dL (ref 3.5–5.0)
Alkaline Phosphatase: 501 U/L — ABNORMAL HIGH (ref 38–126)
Anion gap: 11 (ref 5–15)
BUN: 12 mg/dL (ref 8–23)
CO2: 24 mmol/L (ref 22–32)
Calcium: 9.3 mg/dL (ref 8.9–10.3)
Chloride: 105 mmol/L (ref 98–111)
Creatinine, Ser: 0.62 mg/dL (ref 0.44–1.00)
GFR calc Af Amer: >60 mL/min (ref >60)
GFR calc non Af Amer: >60 mL/min (ref >60)
Glucose, Bld: 145 mg/dL — ABNORMAL HIGH (ref 70–99)
Potassium: 3.1 mmol/L — ABNORMAL LOW (ref 3.5–5.1)
Sodium: 140 mmol/L (ref 135–145)
Total Bilirubin: 2 mg/dL — ABNORMAL HIGH (ref 0.3–1.2)
Total Protein: 7.6 g/dL (ref 6.5–8.1)

## 2020-03-20 LAB — LIPASE, BLOOD: Lipase: 76 U/L — ABNORMAL HIGH (ref 11–51)

## 2020-03-20 LAB — AMYLASE: Amylase: 140 U/L — ABNORMAL HIGH (ref 28–100)

## 2020-03-20 MED ORDER — DEXAMETHASONE SODIUM PHOSPHATE 10 MG/ML IJ SOLN
10.0000 mg | Freq: Once | INTRAMUSCULAR | Status: AC
  Administered 2020-03-20: 10 mg via INTRAVENOUS
  Filled 2020-03-20: qty 1

## 2020-03-20 MED ORDER — ONDANSETRON HCL 8 MG PO TABS: 8.0000 mg | ORAL_TABLET | Freq: Three times a day (TID) | ORAL | 1 refills | Status: DC | PRN

## 2020-03-20 MED ORDER — SODIUM CHLORIDE 0.9 % IV SOLN
INTRAVENOUS | Status: DC
  Filled 2020-03-20: qty 250

## 2020-03-20 MED ORDER — ONDANSETRON HCL 4 MG/2ML IJ SOLN: 8.0000 mg | Freq: Once | INTRAMUSCULAR | Status: DC

## 2020-03-20 MED ORDER — ONDANSETRON HCL 4 MG/2ML IJ SOLN
8.0000 mg | Freq: Once | INTRAMUSCULAR | Status: AC
  Administered 2020-03-20: 8 mg via INTRAVENOUS
  Filled 2020-03-20: qty 4

## 2020-03-20 MED ORDER — SODIUM CHLORIDE 0.9 % IV SOLN
Freq: Once | INTRAVENOUS | Status: AC
  Filled 2020-03-20: qty 250

## 2020-03-20 NOTE — Progress Notes (Signed)
Had a liver biopsy on Thurs of last week. Hx of abd pain and discomfort. Sunday night she states she started feeling bad, coudn't smell food without feeling nauseated,or gagging with brushing her teeth. It reminds her of being pregnant. Ate on Sunday. Monday she had a baked potato and kept that down. Hasn't really eaten since Monday evening. She said she needs to go get some ginger ale and crackers. Her nausea med has been called into pharmacy today. Her stomach feels griping or gassy like she needs to use the bathroom. Last BM Monday solid . When she vomits (twice yest and once today) it was yellow bile. Belches and burps with hiccups. Hasn't taken her meds today.

## 2020-03-20 NOTE — Telephone Encounter (Signed)
Spoke with [place] - ok to send script RF for zofran 8 mg tablet every 8 hours for nausea to patient's pharmacy. Labs - cbc/metc/ amylase and lipase levels

## 2020-03-20 NOTE — Telephone Encounter (Signed)
"  Since the liver biopsy I am having extreme difficulty keeping food/liquids down.  I have been taking 2 extra strength Tylenols for discomfort - every 4-6 hours.  Also  anxious about the liver biopsy"   Received mychart msg from patient. Returned patient's phone call after speaking with Sonia Baller, NP and Sharyn Lull in [facility]. Offered apt for today, but pt declines. Patient c/o nausea/vomiting and "vague" abdominal discomfort. Feels like her abdominal discomfort is related to her nausea/vomiting. She declines an apt in [facility] today, but would like to be scheduled at 9:30 am tomorrow. She would like a prescription for Zofran to be sent to [facility] on [address]. Script - pended. Sonia Baller- please approve.  She feels that if she can get nausea under control with an oral zofran, she will "potentially feel much better."  Also - what labs do you want for tomorrow.

## 2020-03-20 NOTE — Progress Notes (Signed)
Pt received IVF's and supportive meds in [facility] this afternoon. States she feels some better at discharge. Plan to see back Fri am for labs/ See NP.

## 2020-03-20 NOTE — Progress Notes (Signed)
Symptom Management Consult note [facility]  Telephone:([phone]) [phone] Fax:([phone]) [phone]  Patient Care Team: Steele Sizer, MD as PCP - General (Family Medicine) Cammie Sickle, MD as Consulting Physician (Oncology) Corey Skains, MD as Consulting Physician (Cardiology) Gabriel Carina Betsey Holiday, MD as Consulting Physician (Endocrinology) Baxter Kail, MD as Consulting Physician (Dermatology) Anell Barr, OD as Consulting Physician (Optometry) Pieter Partridge, MD as Referring Physician (Dermatology)   Name of the patient: Theresa Andrews  [id number]  Dec 05, 1945   Date of visit: 03/20/2020   Diagnosis-breast cancer  Chief complaint/ Reason for visit-nausea/vomiting  Heme/Onc history:  Oncology History Overview Note  # AUG 2011- LEFT BREAST CA Stage II ER/PR- Pos; her 2 NEG; s/p neo-adj ACx4- poor response; s/p lumpec [Dr.Arru]- ypT2 [2.5cm]ypsN-0/2; Taxol weekly 9/12 [sec to PN]; finished march 2012. Arimidex-May 2012; Aug 2013- changed to Aug 2013; sep 2013- Started Tam; Mammo- in April 2017.   # Declines extended anti-hormone therapy.    # Feb 2017- Bone scan- Negative [hypercalcemia] ; mammogram May 2018-wnl.   # Hypercalcemia- chronic [2016]; primary hyperparathyrdoisim; improved after stopping HCTZ.    # PN- G-1  DIAGNOSIS: breast cancer  STAGE:  II       ;GOALS: cure  CURRENT/MOST RECENT THERAPY: surviellance    Breast CA ([place])  10/29/2014 Initial Diagnosis   Breast CA ([place])   Carcinoma of overlapping sites of left breast in female, estrogen receptor positive (HCC)   Interval history-Theresa Andrews is a 74 year old female with past medical history significant for multiple comorbidities including ER/PR positive stage II breast cancer who was recently evaluated for intermittent abdominal discomfort.  MRI of abdomen shows multicystic lesions in the pancreas concerning for malignancy versus infection/inflammation.  She had an IR guided liver biopsy on  03/14/2020. Biopsy results pending.  Portal vein thrombus of unclear etiology-recommended 1.5 mg per cake subcu Lovenox daily. Wait to start until after liver biopsy.  Theresa Andrews reports nausea that started on Sunday with one episode of vomiting.  Reports a dull aching discomfort in her epigastric area.  Has nausea with food and with intermittent vomiting that occurs after eating. Denies diarrhea or constipation. Last BM was Monday. Has not tried any OTC medications. Denies a fever or recent illness. Specifically denies cough, chest pain or urinary concerns. Feels hungry. Liver biopsy site sore but denies redness.   ECOG FS:1 - Symptomatic but completely ambulatory  Review of systems- Review of Systems  Constitutional: Positive for malaise/fatigue. Negative for chills, fever and weight loss.  HENT: Negative for congestion, ear pain and tinnitus.   Eyes: Negative.  Negative for blurred vision and double vision.  Respiratory: Negative.  Negative for cough, sputum production and shortness of breath.   Cardiovascular: Negative.  Negative for chest pain, palpitations and leg swelling.  Gastrointestinal: Positive for abdominal pain, nausea and vomiting. Negative for constipation and diarrhea.  Genitourinary: Negative for dysuria, frequency and urgency.  Musculoskeletal: Negative for back pain and falls.  Skin: Negative.  Negative for rash.  Neurological: Negative.  Negative for weakness and headaches.  Endo/Heme/Allergies: Negative.  Does not bruise/bleed easily.  Psychiatric/Behavioral: Negative.  Negative for depression. The patient is not nervous/anxious and does not have insomnia.      Current treatment-pending results from liver biopsy  Allergies  Allergen Reactions  . Tetanus Toxoids Shortness Of Breath and Rash  . Shellfish Allergy      Past Medical History:  Diagnosis Date  . Anxiety   . Breast cancer (  [place])    pT2 pN0 cM0 (stage IIA) invasive mammary carcinoma of left outer  breast status post lumpectomy and sentinel node study on July 14, 2010  . CAD (coronary artery disease)   . Depression   . H/O hypokalemia   . History of chemotherapy   . History of MI (myocardial infarction)   . Hyperglycemia   . Hyperlipidemia   . Hypertension   . Peripheral neuropathy due to chemotherapy ([place])   . Personal history of chemotherapy 2011   left breast ca  . Personal history of radiation therapy 2001   left breast ca     Past Surgical History:  Procedure Laterality Date  . ABDOMINAL HYSTERECTOMY    . BREAST BIOPSY Left 2008   neg  . BREAST BIOPSY Left 03/05/2010   invasive mammary carcinoma  . BREAST LUMPECTOMY Left 07/14/2010   INVASIVE MAMMARY CARCINOMA WITH PROMINENT INFILTRATIVE PATTERN, negative margins  . CHOLECYSTECTOMY    . COLONOSCOPY WITH PROPOFOL N/A 11/22/2015   Procedure: COLONOSCOPY WITH PROPOFOL;  Surgeon: Hulen Luster, MD;  [facility] ENDOSCOPY;  Service: Gastroenterology;  Laterality: N/A;  . CORONARY ANGIOPLASTY WITH STENT PLACEMENT  2008    Social History   Socioeconomic History  . Marital status: Divorced    Spouse name: Not on file  . Number of children: 3  . Years of education: some college, no degree  . Highest education level: 12th grade  Occupational History    Employer: RETIRED  Tobacco Use  . Smoking status: Former Smoker    Packs/day: 0.25    Years: 25.00    Pack years: 6.25    Types: Cigarettes    Quit date: 2008    Years since quitting: 13.6  . Smokeless tobacco: Never Used  . Tobacco comment: smoking cessation materials not required  Vaping Use  . Vaping Use: Never used  Substance and Sexual Activity  . Alcohol use: No    Comment: rare; holidays  . Drug use: No  . Sexual activity: Not Currently  Other Topics Concern  . Not on file  Social History Narrative  . Not on file   Social Determinants of Health   Financial Resource Strain: Low Risk   . Difficulty of Paying Living Expenses: Not hard at all    Food Insecurity: No Food Insecurity  . Worried About Charity fundraiser in the Last Year: Never true  . Ran Out of Food in the Last Year: Never true  Transportation Needs: No Transportation Needs  . Lack of Transportation (Medical): No  . Lack of Transportation (Non-Medical): No  Physical Activity: Sufficiently Active  . Days of Exercise per Week: 7 days  . Minutes of Exercise per Session: 30 min  Stress: No Stress Concern Present  . Feeling of Stress : Not at all  Social Connections: Socially Isolated  . Frequency of Communication with Friends and Family: More than three times a week  . Frequency of Social Gatherings with Friends and Family: Three times a week  . Attends Religious Services: Never  . Active Member of Clubs or Organizations: No  . Attends Archivist Meetings: Never  . Marital Status: Divorced  Human resources officer Violence: Not At Risk  . Fear of Current or Ex-Partner: No  . Emotionally Abused: No  . Physically Abused: No  . Sexually Abused: No    Family History  Problem Relation Age of Onset  . Breast cancer Sister 55  . Hypertension Mother   . Aneurysm Mother   .  Heart attack Sister   . Birth defects Sister      Current Outpatient Medications:  .  amLODipine (NORVASC) 10 MG tablet, Take 1 tablet (10 mg total) by mouth daily., Disp: 90 tablet, Rfl: 1 .  aspirin 81 MG EC tablet, Take 81 mg by mouth daily., Disp: , Rfl:  .  cholecalciferol (VITAMIN D) 1000 units tablet, Take 1,000 Units by mouth daily., Disp: , Rfl:  .  clindamycin-benzoyl peroxide (BENZACLIN) gel, Apply 1 application topically 2 (two) times daily. , Disp: , Rfl: 0 .  enoxaparin (LOVENOX) 120 MG/0.8ML injection, Inject 0.8 mLs (120 mg total) into the skin daily., Disp: 12 mL, Rfl: 0 .  metoprolol succinate (TOPROL-XL) 25 MG 24 hr tablet, Take 0.5 tablets (12.5 mg total) by mouth daily., Disp: 45 tablet, Rfl: 3 .  omeprazole (PRILOSEC) 40 MG capsule, Take 1 capsule (40 mg total) by  mouth daily., Disp: 90 capsule, Rfl: 0 .  ondansetron (ZOFRAN) 4 MG tablet, Take 1 tablet (4 mg total) by mouth every 8 (eight) hours as needed for nausea or vomiting., Disp: 20 tablet, Rfl: 0 .  rosuvastatin (CRESTOR) 5 MG tablet, Take 5 mg by mouth daily., Disp: , Rfl:  .  telmisartan (MICARDIS) 80 MG tablet, One daily, Disp: 90 tablet, Rfl: 1 .  tretinoin (RETIN-A) 0.025 % cream, Apply topically at bedtime. , Disp: , Rfl: 4 .  ondansetron (ZOFRAN) 8 MG tablet, Take 1 tablet (8 mg total) by mouth every 8 (eight) hours as needed for nausea or vomiting., Disp: 30 tablet, Rfl: 1  Physical exam:  Vitals:   03/20/20 1305  BP: (!) 154/90  Pulse: 96  Resp: 18  Temp: (!) 96.9 F (36.1 C)  TempSrc: Tympanic  SpO2: 100%  Weight: 174 lb 6.4 oz (79.1 kg)   Physical Exam Constitutional:      Appearance: Normal appearance.  HENT:     Head: Normocephalic and atraumatic.  Eyes:     Pupils: Pupils are equal, round, and reactive to light.  Cardiovascular:     Rate and Rhythm: Normal rate and regular rhythm.     Heart sounds: Normal heart sounds. No murmur heard.   Pulmonary:     Effort: Pulmonary effort is normal.     Breath sounds: Normal breath sounds. No wheezing.  Abdominal:     General: Bowel sounds are normal. There is no distension.     Palpations: Abdomen is soft.     Tenderness: There is no abdominal tenderness.    Musculoskeletal:        General: Normal range of motion.     Cervical back: Normal range of motion.  Skin:    General: Skin is warm and dry.     Findings: No rash.  Neurological:     Mental Status: She is alert and oriented to person, place, and time.  Psychiatric:        Judgment: Judgment normal.      CMP Latest Ref Rng & Units 03/20/2020  Glucose 70 - 99 mg/dL 145(H)  BUN 8 - 23 mg/dL 12  Creatinine 0.44 - 1.00 mg/dL 0.62  Sodium 135 - 145 mmol/L 140  Potassium 3.5 - 5.1 mmol/L 3.1(L)  Chloride 98 - 111 mmol/L 105  CO2 22 - 32 mmol/L 24  Calcium 8.9 -  10.3 mg/dL 9.3  Total Protein 6.5 - 8.1 g/dL 7.6  Total Bilirubin 0.3 - 1.2 mg/dL 2.0(H)  Alkaline Phos 38 - 126 U/L 501(H)  AST 15 - 41  U/L 154(H)  ALT 0 - 44 U/L 160(H)   CBC Latest Ref Rng & Units 03/20/2020  WBC 4.0 - 10.5 K/uL 11.6(H)  Hemoglobin 12.0 - 15.0 g/dL 11.5(L)  Hematocrit 36 - 46 % 34.2(L)  Platelets 150 - 400 K/uL 286    No images are attached to the encounter.  CT Biopsy  Result Date: 03/14/2020 CLINICAL DATA:  Cystic pancreatic lesion. Multiple low-attenuation liver lesions. Biopsy requested. EXAM: CT GUIDED CORE BIOPSY OF LIVER LESION ANESTHESIA/SEDATION: Intravenous Fentanyl 69mg and Versed 0.572mwere administered as conscious sedation during continuous monitoring of the patient's level of consciousness and physiological / cardiorespiratory status by the radiology RN, with a total moderate sedation time of 9 minutes. PROCEDURE: The procedure risks, benefits, and alternatives were explained to the patient. Questions regarding the procedure were encouraged and answered. The patient understands and consents to the procedure. Select axial scans were obtained through the upper abdomen. An appropriate skin entry site was determined and marked. The operative field was prepped with chlorhexidinein a sterile fashion, and a sterile drape was applied covering the operative field. A sterile gown and sterile gloves were used for the procedure. Local anesthesia was provided with 1% Lidocaine. Under CT fluoroscopic guidance, a 17 gauge trocar needle was advanced to the margin of the lesion. Once needle tip position was confirmed, coaxial 18-gauge core biopsy samples were obtained, submitted in formalin to surgical pathology. The guide needle was removed. Postprocedure scans show no hemorrhage or other apparent complication. The patient tolerated the procedure well. COMPLICATIONS: None immediate FINDINGS: Hypoechoic right lobe liver lesions were identified corresponding to previous MR and CT.  Core biopsy samples of a representative lesion were obtained. IMPRESSION: Technically successful CT-guided core biopsy, right lobe liver lesion. Electronically Signed   By: D Lucrezia Europe.D.   On: 03/14/2020 15:39   MR ABDOMEN MRCP W WO CONTAST  Result Date: 03/07/2020 CLINICAL DATA:  Status post cholecystectomy, abdominal discomfort, pancreatic lesions on CT EXAM: MRI ABDOMEN WITHOUT AND WITH CONTRAST (INCLUDING MRCP) TECHNIQUE: Multiplanar multisequence MR imaging of the abdomen was performed both before and after the administration of intravenous contrast. Heavily T2-weighted images of the biliary and pancreatic ducts were obtained, and three-dimensional MRCP images were rendered by post processing. CONTRAST:  61m66mADAVIST GADOBUTROL 1 MMOL/ML IV SOLN COMPARISON:  CT abdomen pelvis, 02/19/2020 FINDINGS: Lower chest: No acute findings. Hepatobiliary: There are multiple heterogeneously hypoenhancing masses of the liver parenchyma, which are somewhat ill-defined, although appear to have increased in size and confluence compared to recent prior CT dated 02/19/2020, the largest lesion in the central liver dome measuring 4.6 x 2.5 cm, not definitely present on prior examination (series 19, image 18). These generally have an unusual, stippled texture. There is no biliary ductal dilatation and the common bile duct is visualized to the ampulla. Status post cholecystectomy. Pancreas: Extensive inflammatory stranding about the pancreas and adjacent retroperitoneum. There is extensive multiloculated cystic change of the pancreatic head, largest component measuring at least 3.9 x 2.9 cm (series 7, image 15), and the pancreatic duct is diffusely dilated to the pancreatic head measuring up to 1.2 cm in caliber. Spleen:  Within normal limits in size and appearance. Adrenals/Urinary Tract: No masses identified. Intrinsically T1 hyperintense, T2 intermediate exophytic lesion of the superior pole of the left kidney, which is  nonenhancing and consistent with a proteinaceous or hemorrhagic cyst (series 17, image 34). No evidence of hydronephrosis. Stomach/Bowel: Visualized portions within the abdomen are unremarkable. Vascular/Lymphatic: Enlarged retroperitoneal and portacaval lymph  nodes, measuring up to 1.7 x 1.4 cm (series 17, image 43). No abdominal aortic aneurysm demonstrated. Redemonstrated portal vein thrombosis with cavernous transformation and periportal edema. The confluence of the portal vein is effaced in the pancreatic head (series 17, image 35). Gastroesophageal varices. Other:  None. Musculoskeletal: No suspicious bone lesions identified. IMPRESSION: 1. Extensive inflammatory stranding about the pancreas and adjacent retroperitoneum, consistent with pancreatitis and similar to appearance on CT dated 02/19/2020. 2. Extensive multiloculated cystic change of the pancreatic head, largest component measuring at least 3.9 x 2.9 cm, and the pancreatic duct is diffusely dilated to the pancreatic head measuring up to 1.2 cm in caliber. 3. There are multiple heterogeneously hypoenhancing masses of the liver parenchyma, which are somewhat ill-defined, although appear to have increased in size and confluence compared to recent prior CT dated 02/19/2020. These are highly suspicious for metastases, although have a generally unusual, stippled texture. It is possible that these are very unusual appearing hemangiomata or other benign lesions with an appearance very sensitive to specific phase of contrast. 4. Redemonstrated portal vein thrombosis with cavernous transformation and periportal edema. The confluence of the portal vein is effaced in the pancreatic head. 5. Constellation of findings above is difficult to synthesize, but generally suspicious for an underlying malignancy resulting in pancreatitis. There is no directly visualized solid pancreatic head mass. Consider EUS and FNA for tissue sampling of the pancreas, with additional  consideration of image guided biopsy of liver lesions. 6. Enlarged, nonspecific retroperitoneal and portacaval lymph nodes. 7. Intrinsically T1 hyperintense, T2 intermediate exophytic lesion of the superior pole of the left kidney, which is nonenhancing and consistent with a proteinaceous or hemorrhagic cyst. Electronically Signed   By: Eddie Candle M.D.   On: 03/07/2020 14:48   US Abdomen Limited RUQ  Result Date: 03/14/2020 CLINICAL DATA:  Pancreatic and liver lesions, portal vein thrombosis. Biopsy requested EXAM: ULTRASOUND ABDOMEN LIMITED COMPARISON:  CT 02/19/2020 FINDINGS: Directed ultrasound of the liver was performed in contemplation of liver biopsy. Heterogeneity in the right lobe was conspicuous. No discrete lesion corresponding to previous CT findings was confirmed to allow image guided biopsy under ultrasound. For this reason, the patient transferred to CT for biopsy; see separate dictation. IMPRESSION: 1. Heterogenous right hepatic parenchyma. Lesions were not well defined to allow ultrasound-guided biopsy. Patient transferred to CT for CT guided biopsy, dictated separately. Electronically Signed   By: Lucrezia Europe M.D.   On: 03/14/2020 11:05   Assessment and plan- Patient is a 74 y.o. female with past medical history significant for stage II left breast cancer s/p neoadjuvant AC x4 and weekly Taxol and lumpectomy. Completed treatment in March 2012.  Abdominal discomfort-epigastric region associated with nausea and intermittent vomiting. Unclear etiology. Liver biopsy results pending. Has family history of pancreatic cancer. Will trend amylase and lipase for pancreatitis given alcohol abuse in the past and acute pancreatic and liver lesions. AST/ALT and alk phos elevated likely secondary to recent biopsy. Mild leukocytosis-no signs of infection. Will monitor.  Nausea/vomiting-while in clinic we will give 8 mg Zofran and 10 of dexamethasone. Will send prescription for Zofran 8 mg every 8 as  needed for nausea.   Plan- Lab work. IV fluids-1 L 8 mg Zofran/10 mg dexamethasone  Disposition- Improvement noted after IV fluids and antiemetics. We will bring her back on Friday for labs and possible IV fluids. Follow-up with Dr. Rogue Bussing pending results from liver biopsy.   Visit Diagnosis 1. Epigastric pain   2. Lesion of liver  3. Nausea and vomiting, intractability of vomiting not specified, unspecified vomiting type   4. Portal vein thrombosis   5. Alcohol-induced acute pancreatitis without infection or necrosis   6. History of breast cancer     Patient expressed understanding and was in agreement with this plan. She also understands that She can call clinic at any time with any questions, concerns, or complaints.   Greater than 50% was spent in counseling and coordination of care with this patient including but not limited to discussion of the relevant topics above (See A&P) including, but not limited to diagnosis and management of acute and chronic medical conditions.   Thank you for allowing me to participate in the care of this very pleasant patient.    Jacquelin Hawking, NP [facility] Cell - [phone] Pager- [phone] 03/22/2020 9:38 AM

## 2020-03-21 ENCOUNTER — Inpatient Hospital Stay: Payer: Medicare PPO | Admitting: Oncology

## 2020-03-21 ENCOUNTER — Inpatient Hospital Stay: Payer: Medicare PPO

## 2020-03-22 ENCOUNTER — Encounter: Payer: Self-pay | Admitting: Oncology

## 2020-03-22 ENCOUNTER — Other Ambulatory Visit: Payer: Self-pay | Admitting: Oncology

## 2020-03-22 ENCOUNTER — Inpatient Hospital Stay: Payer: Medicare PPO

## 2020-03-22 ENCOUNTER — Other Ambulatory Visit: Payer: Self-pay

## 2020-03-22 ENCOUNTER — Inpatient Hospital Stay (HOSPITAL_BASED_OUTPATIENT_CLINIC_OR_DEPARTMENT_OTHER): Payer: Medicare PPO | Admitting: Oncology

## 2020-03-22 ENCOUNTER — Other Ambulatory Visit: Payer: Self-pay | Admitting: *Deleted

## 2020-03-22 VITALS — BP 135/74 | HR 76 | Temp 97.8°F | Resp 20 | Wt 178.0 lb

## 2020-03-22 DIAGNOSIS — R112 Nausea with vomiting, unspecified: Secondary | ICD-10-CM

## 2020-03-22 DIAGNOSIS — K5900 Constipation, unspecified: Secondary | ICD-10-CM | POA: Diagnosis not present

## 2020-03-22 DIAGNOSIS — Z79899 Other long term (current) drug therapy: Secondary | ICD-10-CM | POA: Diagnosis not present

## 2020-03-22 DIAGNOSIS — I10 Essential (primary) hypertension: Secondary | ICD-10-CM | POA: Diagnosis not present

## 2020-03-22 DIAGNOSIS — C50812 Malignant neoplasm of overlapping sites of left female breast: Secondary | ICD-10-CM

## 2020-03-22 DIAGNOSIS — Z853 Personal history of malignant neoplasm of breast: Secondary | ICD-10-CM | POA: Diagnosis not present

## 2020-03-22 DIAGNOSIS — K769 Liver disease, unspecified: Secondary | ICD-10-CM | POA: Diagnosis not present

## 2020-03-22 DIAGNOSIS — R978 Other abnormal tumor markers: Secondary | ICD-10-CM | POA: Diagnosis not present

## 2020-03-22 DIAGNOSIS — Z170 Estrogen receptor positive status [ER+]: Secondary | ICD-10-CM | POA: Diagnosis not present

## 2020-03-22 LAB — CBC WITH DIFFERENTIAL/PLATELET
Abs Immature Granulocytes: 0.07 10*3/uL (ref 0.00–0.07)
Basophils Absolute: 0 10*3/uL (ref 0.0–0.1)
Basophils Relative: 0 %
Eosinophils Absolute: 0 10*3/uL (ref 0.0–0.5)
Eosinophils Relative: 0 %
HCT: 32.9 % — ABNORMAL LOW (ref 36.0–46.0)
Hemoglobin: 11.1 g/dL — ABNORMAL LOW (ref 12.0–15.0)
Immature Granulocytes: 1 %
Lymphocytes Relative: 11 %
Lymphs Abs: 1.5 10*3/uL (ref 0.7–4.0)
MCH: 30.1 pg (ref 26.0–34.0)
MCHC: 33.7 g/dL (ref 30.0–36.0)
MCV: 89.2 fL (ref 80.0–100.0)
Monocytes Absolute: 0.9 10*3/uL (ref 0.1–1.0)
Monocytes Relative: 7 %
Neutro Abs: 10.6 10*3/uL — ABNORMAL HIGH (ref 1.7–7.7)
Neutrophils Relative %: 81 %
Platelets: 345 10*3/uL (ref 150–400)
RBC: 3.69 MIL/uL — ABNORMAL LOW (ref 3.87–5.11)
RDW: 15.7 % — ABNORMAL HIGH (ref 11.5–15.5)
WBC: 13 10*3/uL — ABNORMAL HIGH (ref 4.0–10.5)
nRBC: 0 % (ref 0.0–0.2)

## 2020-03-22 LAB — COMPREHENSIVE METABOLIC PANEL
ALT: 85 U/L — ABNORMAL HIGH (ref 0–44)
AST: 51 U/L — ABNORMAL HIGH (ref 15–41)
Albumin: 3.3 g/dL — ABNORMAL LOW (ref 3.5–5.0)
Alkaline Phosphatase: 346 U/L — ABNORMAL HIGH (ref 38–126)
Anion gap: 9 (ref 5–15)
BUN: 18 mg/dL (ref 8–23)
CO2: 25 mmol/L (ref 22–32)
Calcium: 9.7 mg/dL (ref 8.9–10.3)
Chloride: 107 mmol/L (ref 98–111)
Creatinine, Ser: 0.72 mg/dL (ref 0.44–1.00)
GFR calc Af Amer: >60 mL/min (ref >60)
GFR calc non Af Amer: >60 mL/min (ref >60)
Glucose, Bld: 125 mg/dL — ABNORMAL HIGH (ref 70–99)
Potassium: 3.2 mmol/L — ABNORMAL LOW (ref 3.5–5.1)
Sodium: 141 mmol/L (ref 135–145)
Total Bilirubin: 0.6 mg/dL (ref 0.3–1.2)
Total Protein: 7.3 g/dL (ref 6.5–8.1)

## 2020-03-22 LAB — AMYLASE: Amylase: 153 U/L — ABNORMAL HIGH (ref 28–100)

## 2020-03-22 MED ORDER — POTASSIUM CHLORIDE CRYS ER 20 MEQ PO TBCR: 20.0000 meq | EXTENDED_RELEASE_TABLET | Freq: Two times a day (BID) | ORAL | 0 refills | Status: DC

## 2020-03-22 NOTE — Progress Notes (Signed)
Symptom Management Consult note [facility]  [phone] [phone]  Patient Care Team: Steele Sizer, MD as PCP - General (Family Medicine) Cammie Sickle, MD as Consulting Physician (Oncology) Corey Skains, MD as Consulting Physician (Cardiology) Gabriel Carina Betsey Holiday, MD as Consulting Physician (Endocrinology) Baxter Kail, MD as Consulting Physician (Dermatology) Anell Barr, OD as Consulting Physician (Optometry) Pieter Partridge, MD as Referring Physician (Dermatology)   Name of the patient: Trinisha Paget  [id number]  Dec 06, 1945   Date of visit: 03/22/2020   Diagnosis-breast cancer  Chief complaint/ Reason for visit-Follow-up  Heme/Onc history:  Oncology History Overview Note  # AUG 2011- LEFT BREAST CA Stage II ER/PR- Pos; her 2 NEG; s/p neo-adj ACx4- poor response; s/p lumpec [Dr.Arru]- ypT2 [2.5cm]ypsN-0/2; Taxol weekly 9/12 [sec to PN]; finished march 2012. Arimidex-May 2012; Aug 2013- changed to Aug 2013; sep 2013- Started Tam; Mammo- in April 2017.   # Declines extended anti-hormone therapy.    # Feb 2017- Bone scan- Negative [hypercalcemia] ; mammogram May 2018-wnl.   # Hypercalcemia- chronic [2016]; primary hyperparathyrdoisim; improved after stopping HCTZ.    # PN- G-1  DIAGNOSIS: breast cancer  STAGE:  II       ;GOALS: cure  CURRENT/MOST RECENT THERAPY: surviellance    Breast CA ([place])  10/29/2014 Initial Diagnosis   Breast CA ([place])   Carcinoma of overlapping sites of left breast in female, estrogen receptor positive (HCC)   Interval history-Mrs. Wilinski is a 74 year old female with past medical history significant for multiple comorbidities including ER/PR positive stage II breast cancer who was recently evaluated for intermittent abdominal discomfort.  MRI of abdomen shows multicystic lesions in the pancreas concerning for malignancy versus infection/inflammation.  She had an IR guided liver biopsy on  03/14/2020. Biopsy results pending.  Portal vein thrombus of unclear etiology-recommended 1.5 mg per cake subcu Lovenox daily. Wait to start until after liver biopsy.  She was evaluated in symptom management on Wednesday, 03/20/2020 for nausea, vomiting and abdominal discomfort.  She was given 1 L normal saline, 10 mg dexamethasone and IV Zofran.  She improved.  She was instructed to return to clinic on Friday, 03/22/2020 for follow-up.  Today, patient states "I am 100% better".  She denied any additional nausea or vomiting since leaving clinic on Wednesday.  Abdominal discomfort has completely resolved.  Endorses constipation and has been using MiraLAX x2 days.  Last BM was on Wednesday.  ECOG FS:1 - Symptomatic but completely ambulatory  Review of systems- Review of Systems  Constitutional: Negative.  Negative for chills, fever, malaise/fatigue and weight loss.  HENT: Negative for congestion, ear pain and tinnitus.   Eyes: Negative.  Negative for blurred vision and double vision.  Respiratory: Negative.  Negative for cough, sputum production and shortness of breath.   Cardiovascular: Negative.  Negative for chest pain, palpitations and leg swelling.  Gastrointestinal: Positive for constipation. Negative for abdominal pain, diarrhea, nausea and vomiting.  Genitourinary: Negative for dysuria, frequency and urgency.  Musculoskeletal: Negative for back pain and falls.  Skin: Negative.  Negative for rash.  Neurological: Negative.  Negative for weakness and headaches.  Endo/Heme/Allergies: Negative.  Does not bruise/bleed easily.  Psychiatric/Behavioral: Negative.  Negative for depression. The patient is not nervous/anxious and does not have insomnia.      Current treatment-pending results from liver biopsy  Allergies  Allergen Reactions  . Tetanus Toxoids Shortness Of Breath and Rash  . Shellfish Allergy      Past  Medical History:  Diagnosis Date  . Anxiety   . Breast cancer ([place])     pT2 pN0 cM0 (stage IIA) invasive mammary carcinoma of left outer breast status post lumpectomy and sentinel node study on July 14, 2010  . CAD (coronary artery disease)   . Depression   . H/O hypokalemia   . History of chemotherapy   . History of MI (myocardial infarction)   . Hyperglycemia   . Hyperlipidemia   . Hypertension   . Peripheral neuropathy due to chemotherapy ([place])   . Personal history of chemotherapy 2011   left breast ca  . Personal history of radiation therapy 2001   left breast ca     Past Surgical History:  Procedure Laterality Date  . ABDOMINAL HYSTERECTOMY    . BREAST BIOPSY Left 2008   neg  . BREAST BIOPSY Left 03/05/2010   invasive mammary carcinoma  . BREAST LUMPECTOMY Left 07/14/2010   INVASIVE MAMMARY CARCINOMA WITH PROMINENT INFILTRATIVE PATTERN, negative margins  . CHOLECYSTECTOMY    . COLONOSCOPY WITH PROPOFOL N/A 11/22/2015   Procedure: COLONOSCOPY WITH PROPOFOL;  Surgeon: Hulen Luster, MD;  Location: [facility]COPY;  Service: Gastroenterology;  Laterality: N/A;  . CORONARY ANGIOPLASTY WITH STENT PLACEMENT  2008    Social History   Socioeconomic History  . Marital status: Divorced    Spouse name: Not on file  . Number of children: 3  . Years of education: some college, no degree  . Highest education level: 12th grade  Occupational History    Employer: RETIRED  Tobacco Use  . Smoking status: Former Smoker    Packs/day: 0.25    Years: 25.00    Pack years: 6.25    Types: Cigarettes    Quit date: 2008    Years since quitting: 13.6  . Smokeless tobacco: Never Used  . Tobacco comment: smoking cessation materials not required  Vaping Use  . Vaping Use: Never used  Substance and Sexual Activity  . Alcohol use: No    Comment: rare; holidays  . Drug use: No  . Sexual activity: Not Currently  Other Topics Concern  . Not on file  Social History Narrative  . Not on file   Social Determinants of Health   Financial Resource Strain: Low  Risk   . Difficulty of Paying Living Expenses: Not hard at all  Food Insecurity: No Food Insecurity  . Worried About Charity fundraiser in the Last Year: Never true  . Ran Out of Food in the Last Year: Never true  Transportation Needs: No Transportation Needs  . Lack of Transportation (Medical): No  . Lack of Transportation (Non-Medical): No  Physical Activity: Sufficiently Active  . Days of Exercise per Week: 7 days  . Minutes of Exercise per Session: 30 min  Stress: No Stress Concern Present  . Feeling of Stress : Not at all  Social Connections: Socially Isolated  . Frequency of Communication with Friends and Family: More than three times a week  . Frequency of Social Gatherings with Friends and Family: Three times a week  . Attends Religious Services: Never  . Active Member of Clubs or Organizations: No  . Attends Archivist Meetings: Never  . Marital Status: Divorced  Human resources officer Violence: Not At Risk  . Fear of Current or Ex-Partner: No  . Emotionally Abused: No  . Physically Abused: No  . Sexually Abused: No    Family History  Problem Relation Age of Onset  . Breast  cancer Sister 5  . Hypertension Mother   . Aneurysm Mother   . Heart attack Sister   . Birth defects Sister      Current Outpatient Medications:  .  amLODipine (NORVASC) 10 MG tablet, Take 1 tablet (10 mg total) by mouth daily., Disp: 90 tablet, Rfl: 1 .  aspirin 81 MG EC tablet, Take 81 mg by mouth daily., Disp: , Rfl:  .  cholecalciferol (VITAMIN D) 1000 units tablet, Take 1,000 Units by mouth daily., Disp: , Rfl:  .  clindamycin-benzoyl peroxide (BENZACLIN) gel, Apply 1 application topically 2 (two) times daily. , Disp: , Rfl: 0 .  enoxaparin (LOVENOX) 120 MG/0.8ML injection, Inject 0.8 mLs (120 mg total) into the skin daily., Disp: 12 mL, Rfl: 0 .  metoprolol succinate (TOPROL-XL) 25 MG 24 hr tablet, Take 0.5 tablets (12.5 mg total) by mouth daily., Disp: 45 tablet, Rfl: 3 .   omeprazole (PRILOSEC) 40 MG capsule, Take 1 capsule (40 mg total) by mouth daily., Disp: 90 capsule, Rfl: 0 .  ondansetron (ZOFRAN) 4 MG tablet, Take 1 tablet (4 mg total) by mouth every 8 (eight) hours as needed for nausea or vomiting., Disp: 20 tablet, Rfl: 0 .  ondansetron (ZOFRAN) 8 MG tablet, Take 1 tablet (8 mg total) by mouth every 8 (eight) hours as needed for nausea or vomiting., Disp: 30 tablet, Rfl: 1 .  rosuvastatin (CRESTOR) 5 MG tablet, Take 5 mg by mouth daily., Disp: , Rfl:  .  telmisartan (MICARDIS) 80 MG tablet, One daily, Disp: 90 tablet, Rfl: 1 .  tretinoin (RETIN-A) 0.025 % cream, Apply topically at bedtime. , Disp: , Rfl: 4 .  potassium chloride SA (KLOR-CON) 20 MEQ tablet, Take 1 tablet (20 mEq total) by mouth 2 (two) times daily., Disp: 6 tablet, Rfl: 0  Physical exam:  Vitals:   03/22/20 1002  BP: 135/74  Pulse: 76  Resp: 20  Temp: 97.8 F (36.6 C)  SpO2: 100%  Weight: 178 lb (80.7 kg)   Physical Exam Constitutional:      Appearance: Normal appearance.  HENT:     Head: Normocephalic and atraumatic.  Eyes:     Pupils: Pupils are equal, round, and reactive to light.  Cardiovascular:     Rate and Rhythm: Normal rate and regular rhythm.     Heart sounds: Normal heart sounds. No murmur heard.   Pulmonary:     Effort: Pulmonary effort is normal.     Breath sounds: Normal breath sounds. No wheezing.  Abdominal:     General: Bowel sounds are normal. There is no distension.     Palpations: Abdomen is soft.     Tenderness: There is no abdominal tenderness.  Musculoskeletal:        General: Normal range of motion.     Cervical back: Normal range of motion.  Skin:    General: Skin is warm and dry.     Findings: No rash.  Neurological:     Mental Status: She is alert and oriented to person, place, and time.  Psychiatric:        Judgment: Judgment normal.      CMP Latest Ref Rng & Units 03/22/2020  Glucose 70 - 99 mg/dL 125(H)  BUN 8 - 23 mg/dL 18    Creatinine 0.44 - 1.00 mg/dL 0.72  Sodium 135 - 145 mmol/L 141  Potassium 3.5 - 5.1 mmol/L 3.2(L)  Chloride 98 - 111 mmol/L 107  CO2 22 - 32 mmol/L 25  Calcium 8.9 -  10.3 mg/dL 9.7  Total Protein 6.5 - 8.1 g/dL 7.3  Total Bilirubin 0.3 - 1.2 mg/dL 0.6  Alkaline Phos 38 - 126 U/L 346(H)  AST 15 - 41 U/L 51(H)  ALT 0 - 44 U/L 85(H)   CBC Latest Ref Rng & Units 03/22/2020  WBC 4.0 - 10.5 K/uL 13.0(H)  Hemoglobin 12.0 - 15.0 g/dL 11.1(L)  Hematocrit 36 - 46 % 32.9(L)  Platelets 150 - 400 K/uL 345    No images are attached to the encounter.  CT Biopsy  Result Date: 03/14/2020 CLINICAL DATA:  Cystic pancreatic lesion. Multiple low-attenuation liver lesions. Biopsy requested. EXAM: CT GUIDED CORE BIOPSY OF LIVER LESION ANESTHESIA/SEDATION: Intravenous Fentanyl 48mcg and Versed 0.5mg  were administered as conscious sedation during continuous monitoring of the patient's level of consciousness and physiological / cardiorespiratory status by the radiology RN, with a total moderate sedation time of 9 minutes. PROCEDURE: The procedure risks, benefits, and alternatives were explained to the patient. Questions regarding the procedure were encouraged and answered. The patient understands and consents to the procedure. Select axial scans were obtained through the upper abdomen. An appropriate skin entry site was determined and marked. The operative field was prepped with chlorhexidinein a sterile fashion, and a sterile drape was applied covering the operative field. A sterile gown and sterile gloves were used for the procedure. Local anesthesia was provided with 1% Lidocaine. Under CT fluoroscopic guidance, a 17 gauge trocar needle was advanced to the margin of the lesion. Once needle tip position was confirmed, coaxial 18-gauge core biopsy samples were obtained, submitted in formalin to surgical pathology. The guide needle was removed. Postprocedure scans show no hemorrhage or other apparent complication. The  patient tolerated the procedure well. COMPLICATIONS: None immediate FINDINGS: Hypoechoic right lobe liver lesions were identified corresponding to previous MR and CT. Core biopsy samples of a representative lesion were obtained. IMPRESSION: Technically successful CT-guided core biopsy, right lobe liver lesion. Electronically Signed   By: Lucrezia Europe M.D.   On: 03/14/2020 15:39   MR ABDOMEN MRCP W WO CONTAST  Result Date: 03/07/2020 CLINICAL DATA:  Status post cholecystectomy, abdominal discomfort, pancreatic lesions on CT EXAM: MRI ABDOMEN WITHOUT AND WITH CONTRAST (INCLUDING MRCP) TECHNIQUE: Multiplanar multisequence MR imaging of the abdomen was performed both before and after the administration of intravenous contrast. Heavily T2-weighted images of the biliary and pancreatic ducts were obtained, and three-dimensional MRCP images were rendered by post processing. CONTRAST:  50mL GADAVIST GADOBUTROL 1 MMOL/ML IV SOLN COMPARISON:  CT abdomen pelvis, 02/19/2020 FINDINGS: Lower chest: No acute findings. Hepatobiliary: There are multiple heterogeneously hypoenhancing masses of the liver parenchyma, which are somewhat ill-defined, although appear to have increased in size and confluence compared to recent prior CT dated 02/19/2020, the largest lesion in the central liver dome measuring 4.6 x 2.5 cm, not definitely present on prior examination (series 19, image 18). These generally have an unusual, stippled texture. There is no biliary ductal dilatation and the common bile duct is visualized to the ampulla. Status post cholecystectomy. Pancreas: Extensive inflammatory stranding about the pancreas and adjacent retroperitoneum. There is extensive multiloculated cystic change of the pancreatic head, largest component measuring at least 3.9 x 2.9 cm (series 7, image 15), and the pancreatic duct is diffusely dilated to the pancreatic head measuring up to 1.2 cm in caliber. Spleen:  Within normal limits in size and  appearance. Adrenals/Urinary Tract: No masses identified. Intrinsically T1 hyperintense, T2 intermediate exophytic lesion of the superior pole of the left kidney, which  is nonenhancing and consistent with a proteinaceous or hemorrhagic cyst (series 17, image 34). No evidence of hydronephrosis. Stomach/Bowel: Visualized portions within the abdomen are unremarkable. Vascular/Lymphatic: Enlarged retroperitoneal and portacaval lymph nodes, measuring up to 1.7 x 1.4 cm (series 17, image 43). No abdominal aortic aneurysm demonstrated. Redemonstrated portal vein thrombosis with cavernous transformation and periportal edema. The confluence of the portal vein is effaced in the pancreatic head (series 17, image 35). Gastroesophageal varices. Other:  None. Musculoskeletal: No suspicious bone lesions identified. IMPRESSION: 1. Extensive inflammatory stranding about the pancreas and adjacent retroperitoneum, consistent with pancreatitis and similar to appearance on CT dated 02/19/2020. 2. Extensive multiloculated cystic change of the pancreatic head, largest component measuring at least 3.9 x 2.9 cm, and the pancreatic duct is diffusely dilated to the pancreatic head measuring up to 1.2 cm in caliber. 3. There are multiple heterogeneously hypoenhancing masses of the liver parenchyma, which are somewhat ill-defined, although appear to have increased in size and confluence compared to recent prior CT dated 02/19/2020. These are highly suspicious for metastases, although have a generally unusual, stippled texture. It is possible that these are very unusual appearing hemangiomata or other benign lesions with an appearance very sensitive to specific phase of contrast. 4. Redemonstrated portal vein thrombosis with cavernous transformation and periportal edema. The confluence of the portal vein is effaced in the pancreatic head. 5. Constellation of findings above is difficult to synthesize, but generally suspicious for an underlying  malignancy resulting in pancreatitis. There is no directly visualized solid pancreatic head mass. Consider EUS and FNA for tissue sampling of the pancreas, with additional consideration of image guided biopsy of liver lesions. 6. Enlarged, nonspecific retroperitoneal and portacaval lymph nodes. 7. Intrinsically T1 hyperintense, T2 intermediate exophytic lesion of the superior pole of the left kidney, which is nonenhancing and consistent with a proteinaceous or hemorrhagic cyst. Electronically Signed   By: Eddie Candle M.D.   On: 03/07/2020 14:48   US Abdomen Limited RUQ  Result Date: 03/14/2020 CLINICAL DATA:  Pancreatic and liver lesions, portal vein thrombosis. Biopsy requested EXAM: ULTRASOUND ABDOMEN LIMITED COMPARISON:  CT 02/19/2020 FINDINGS: Directed ultrasound of the liver was performed in contemplation of liver biopsy. Heterogeneity in the right lobe was conspicuous. No discrete lesion corresponding to previous CT findings was confirmed to allow image guided biopsy under ultrasound. For this reason, the patient transferred to CT for biopsy; see separate dictation. IMPRESSION: 1. Heterogenous right hepatic parenchyma. Lesions were not well defined to allow ultrasound-guided biopsy. Patient transferred to CT for CT guided biopsy, dictated separately. Electronically Signed   By: Lucrezia Europe M.D.   On: 03/14/2020 11:05   Assessment and plan- Patient is a 74 y.o. female with past medical history significant for stage II left breast cancer s/p neoadjuvant AC x4 and weekly Taxol and lumpectomy. Completed treatment in March 2012.  Abdominal pain has completely resolved since IV fluids, IV nausea and IV steroids on Wednesday.  Improvement noted almost immediately after leaving clinic.  She has not had to use her antiemetics at home.  Labs today have tremendously improved.  AST/ALT are almost completely back to normal.  Bilirubin is normal.  Mild leukocytosis likely secondary to steroids.  No evidence of  infection.    Mild hypokalemia- Will send in prescription for potassium x3 days.  Constipation-recommend MiraLAX twice daily along with Senokot.  Both can be found over-the-counter.  Can try fleets enema if needed.  Plan- Repeat lab work. Rx potassium 20 mEq twice daily x3  days. OTC MiraLAX and Senokot for constipation. Fleets enema if needed.  Disposition- Follow-up with Dr. Rogue Bussing pending results from liver biopsy.   Visit Diagnosis 1. Nausea and vomiting, intractability of vomiting not specified, unspecified vomiting type   2. History of breast cancer   3. Lesion of liver     Patient expressed understanding and was in agreement with this plan. She also understands that She can call clinic at any time with any questions, concerns, or complaints.   Greater than 50% was spent in counseling and coordination of care with this patient including but not limited to discussion of the relevant topics above (See A&P) including, but not limited to diagnosis and management of acute and chronic medical conditions.   Thank you for allowing me to participate in the care of this very pleasant patient.    Jacquelin Hawking, NP [place] [facility] Cell - [phone] Pager- [phone] 03/22/2020 10:57 AM

## 2020-03-24 ENCOUNTER — Telehealth: Payer: Self-pay | Admitting: Internal Medicine

## 2020-03-24 NOTE — Telephone Encounter (Signed)
On 8/27-spoke to patient regarding the pending results from pathology. As per discussion with pathology-[facility] reviewed pending.

## 2020-03-28 ENCOUNTER — Encounter: Payer: Self-pay | Admitting: Internal Medicine

## 2020-03-29 NOTE — Telephone Encounter (Signed)
I will call her.

## 2020-03-29 NOTE — Telephone Encounter (Signed)
Called and spoke to patient.  States today she is feeling "completely fine".  Believes her symptoms for due to "depression".  She is waiting on her results from her biopsy and is feeling very anxious and depressed.  She denies any additional discomfort or pain.  Having normal bowel movements.  Faythe Casa, NP 03/29/2020 1:04 PM

## 2020-04-03 ENCOUNTER — Telehealth: Payer: Self-pay | Admitting: Internal Medicine

## 2020-04-03 ENCOUNTER — Other Ambulatory Visit: Payer: Self-pay | Admitting: Internal Medicine

## 2020-04-03 ENCOUNTER — Telehealth: Payer: Self-pay | Admitting: *Deleted

## 2020-04-03 MED ORDER — APIXABAN 5 MG PO TABS: 5.0000 mg | ORAL_TABLET | Freq: Two times a day (BID) | ORAL | 5 refills | Status: DC

## 2020-04-03 NOTE — Telephone Encounter (Signed)
Spoke with patient. Last dose of lovenox 24 hours ago. Patient educated that md will send a new prescription for eliquis to her pharmacy. Discussed with patient the side effects of eliquis. Teach back process performed with patient. Patient aware that she can start the eliquis this evening. She will call our office if financial assistance is needed with eliquis. Patient updated that pathology specimen was sent to [facility] for 2nd opinion. We are still waiting on final results.  Dr. Jacinto Reap -  I called and spoke with Angie in pathology. Ermalene Searing pathologist ordered additional stains for the liver biopsy. Path still pending.

## 2020-04-03 NOTE — Telephone Encounter (Signed)
Patient called to report that she had given her last Lovenox injection and wanted to know if there was another medication to replace it. She also requested the results from her liver biopsy if available.

## 2020-04-03 NOTE — Telephone Encounter (Signed)
Spoke to patient regarding reason for pending pathology/pending with [facility].  Also discussed with pathology.  Reviewed recommendations regarding starting Eliquis.

## 2020-04-03 NOTE — Progress Notes (Signed)
Sent the script for eliquis.  GB

## 2020-04-16 ENCOUNTER — Telehealth: Payer: Self-pay | Admitting: *Deleted

## 2020-04-16 NOTE — Telephone Encounter (Signed)
Patient called asking for a return call to discuss her Liver biopsy

## 2020-04-17 ENCOUNTER — Telehealth: Payer: Self-pay | Admitting: Internal Medicine

## 2020-04-17 NOTE — Telephone Encounter (Signed)
See md phone note 

## 2020-04-17 NOTE — Telephone Encounter (Signed)
On 9/21-spoke to patient regarding my discussion with [facility] pathology-pathology still unavailable from [facility].  Apologized to the patient for the delay.   Clinically patient seems to be improving.  Appetite is coming back; gaining weight.  Abdominal discomfort resolved.   Will reach out to patient as soon as the pathology available.  GB

## 2020-04-19 LAB — SURGICAL PATHOLOGY

## 2020-04-22 ENCOUNTER — Telehealth: Payer: Self-pay | Admitting: Internal Medicine

## 2020-04-22 DIAGNOSIS — Z853 Personal history of malignant neoplasm of breast: Secondary | ICD-10-CM

## 2020-04-22 DIAGNOSIS — K769 Liver disease, unspecified: Secondary | ICD-10-CM

## 2020-04-22 NOTE — Telephone Encounter (Signed)
On 9/24-spoke to patient regarding the results of the liver biopsy negative for malignancy.  Recommend follow-up in the cancer center to discuss the results in detail.   MDT.  C-please schedule follow-up later in this week- MD; labs-CBC CMP- Thanks GB

## 2020-04-22 NOTE — Addendum Note (Signed)
Addended by: Gloris Ham on: 04/22/2020 12:50 PM   Modules accepted: Orders

## 2020-04-24 ENCOUNTER — Ambulatory Visit: Payer: Medicare PPO | Admitting: Gastroenterology

## 2020-04-25 ENCOUNTER — Inpatient Hospital Stay (HOSPITAL_BASED_OUTPATIENT_CLINIC_OR_DEPARTMENT_OTHER): Payer: Medicare PPO | Admitting: Internal Medicine

## 2020-04-25 ENCOUNTER — Other Ambulatory Visit: Payer: Self-pay | Admitting: *Deleted

## 2020-04-25 ENCOUNTER — Inpatient Hospital Stay: Payer: Medicare PPO | Attending: Internal Medicine

## 2020-04-25 ENCOUNTER — Other Ambulatory Visit: Payer: Self-pay

## 2020-04-25 VITALS — BP 139/70 | HR 64 | Temp 96.7°F | Resp 16 | Ht 64.0 in | Wt 172.0 lb

## 2020-04-25 DIAGNOSIS — Z7901 Long term (current) use of anticoagulants: Secondary | ICD-10-CM | POA: Insufficient documentation

## 2020-04-25 DIAGNOSIS — K769 Liver disease, unspecified: Secondary | ICD-10-CM | POA: Diagnosis not present

## 2020-04-25 DIAGNOSIS — Z87891 Personal history of nicotine dependence: Secondary | ICD-10-CM | POA: Diagnosis not present

## 2020-04-25 DIAGNOSIS — Z853 Personal history of malignant neoplasm of breast: Secondary | ICD-10-CM | POA: Diagnosis not present

## 2020-04-25 DIAGNOSIS — I81 Portal vein thrombosis: Secondary | ICD-10-CM | POA: Insufficient documentation

## 2020-04-25 DIAGNOSIS — Z79899 Other long term (current) drug therapy: Secondary | ICD-10-CM | POA: Insufficient documentation

## 2020-04-25 LAB — CBC WITH DIFFERENTIAL/PLATELET
Abs Immature Granulocytes: 0.02 10*3/uL (ref 0.00–0.07)
Basophils Absolute: 0.1 10*3/uL (ref 0.0–0.1)
Basophils Relative: 1 %
Eosinophils Absolute: 0.6 10*3/uL — ABNORMAL HIGH (ref 0.0–0.5)
Eosinophils Relative: 7 %
HCT: 37.8 % (ref 36.0–46.0)
Hemoglobin: 12.8 g/dL (ref 12.0–15.0)
Immature Granulocytes: 0 %
Lymphocytes Relative: 22 %
Lymphs Abs: 1.6 10*3/uL (ref 0.7–4.0)
MCH: 30 pg (ref 26.0–34.0)
MCHC: 33.9 g/dL (ref 30.0–36.0)
MCV: 88.5 fL (ref 80.0–100.0)
Monocytes Absolute: 0.9 10*3/uL (ref 0.1–1.0)
Monocytes Relative: 12 %
Neutro Abs: 4.3 10*3/uL (ref 1.7–7.7)
Neutrophils Relative %: 58 %
Platelets: 221 10*3/uL (ref 150–400)
RBC: 4.27 MIL/uL (ref 3.87–5.11)
RDW: 15 % (ref 11.5–15.5)
WBC: 7.5 10*3/uL (ref 4.0–10.5)
nRBC: 0 % (ref 0.0–0.2)

## 2020-04-25 LAB — COMPREHENSIVE METABOLIC PANEL
ALT: 15 U/L (ref 0–44)
AST: 20 U/L (ref 15–41)
Albumin: 3.6 g/dL (ref 3.5–5.0)
Alkaline Phosphatase: 101 U/L (ref 38–126)
Anion gap: 9 (ref 5–15)
BUN: 7 mg/dL — ABNORMAL LOW (ref 8–23)
CO2: 29 mmol/L (ref 22–32)
Calcium: 9.3 mg/dL (ref 8.9–10.3)
Chloride: 103 mmol/L (ref 98–111)
Creatinine, Ser: 0.66 mg/dL (ref 0.44–1.00)
GFR calc Af Amer: >60 mL/min (ref >60)
GFR calc non Af Amer: >60 mL/min (ref >60)
Glucose, Bld: 139 mg/dL — ABNORMAL HIGH (ref 70–99)
Potassium: 2.8 mmol/L — ABNORMAL LOW (ref 3.5–5.1)
Sodium: 141 mmol/L (ref 135–145)
Total Bilirubin: 0.9 mg/dL (ref 0.3–1.2)
Total Protein: 7.3 g/dL (ref 6.5–8.1)

## 2020-04-25 NOTE — Assessment & Plan Note (Addendum)
#  Liver lesions noted; with multicystic lesions noted in the pancreas- concerning for malignancy versus infection/inflammation.  Liver biopsy- [second opinion at [facility]]-negative for malignancy- see discussion bellow. Given ? Autoimmune etiology- will refer to GI. Will also review at the tumor conference.   #Portal vein thrombosis- based on imaging- leading to extensive liver changes [see above]unclear etiology-we will order hypercoagulable work-up at next visit. Currently on Eliquis BID- tolerating well.    # Left breast cancer stage II ER PR positive HER-2 negative; finished 5 years in May 2017- STABLE.  # PN- G-1 on essential oils. STABLE.   # DISPOSITION:  # referral to Dr.Anna/GI re: ? Autoimmune liver disease s/p liver Bx. # Follow up in 2nd week of Jan 2022; labs- cbc/cmp;hyper-coag; Dr.B

## 2020-04-25 NOTE — Progress Notes (Signed)
[facility] OFFICE PROGRESS NOTE  Patient Care Team: Steele Sizer, MD as PCP - General (Family Medicine) Cammie Sickle, MD as Consulting Physician (Oncology) Corey Skains, MD as Consulting Physician (Cardiology) Gabriel Carina Betsey Holiday, MD as Consulting Physician (Endocrinology) Baxter Kail, MD as Consulting Physician (Dermatology) Anell Barr, OD as Consulting Physician (Optometry) Pieter Partridge, MD as Referring Physician (Dermatology)  Cancer Staging No matching staging information was found for the patient.   Oncology History Overview Note  # AUG 2011- LEFT BREAST CA Stage II ER/PR- Pos; her 2 NEG; s/p neo-adj ACx4- poor response; s/p lumpec [Dr.Arru]- ypT2 [2.5cm]ypsN-0/2; Taxol weekly 9/12 [sec to PN]; finished march 2012. Arimidex-May 2012; Aug 2013- changed to Aug 2013; sep 2013- Started Tam; Mammo- in April 2017.   # Declines extended anti-hormone therapy; # Feb 2017- Bone scan- Negative [hypercalcemia] ;   # AUG 2021- liver Biopsy- LIVER; BIOPSY:[[facility] II OPINION] - LIVER PARENCHYMA WITH ECTATIC PORTAL VEINS WITH HERNIATION AND FOCI SUGGESTIVE OF ORGANIZING THROMBI, PERIDUCTAL FIBROSIS WITH CHRONIC  LYMPHOPLASMACYTIC INFILTRATE, HEMOSIDERIN DEPOSITION AND PERIPORTAL  REGENERATIVE HYPERPLASIA, SUGGESTIVE OF CHANGES SECONDARY TO PORTAL VEIN  THROMBOSIS (CLINICAL). - FEATURES OF OBSTRUCTIVE BILIARY CHANGES. - NO EVIDENCE OF MALIGNANCY IS SEEN.   # Hypercalcemia- chronic [2016]; primary hyperparathyrdoisim; improved after stopping HCTZ.    # PN- G-1  DIAGNOSIS: breast cancer  STAGE:  II       ;GOALS: cure  CURRENT/MOST RECENT THERAPY: surviellance    Breast CA ([place])  10/29/2014 Initial Diagnosis   Breast CA (H[facility])   Carcinoma of overlapping sites of left breast in female, estrogen receptor positive ([place])      INTERVAL HISTORY:  Theresa Andrews 74 y.o.  female pleasant patient above history of ER PR positive stage II breast cancer; is here to  follow-up today with results of the liver biopsy.  Patient states appetite is improving.  She feels 100% better.  No nausea no vomiting pain abdominal pain.  She is trying to lose weight.   Review of Systems  Constitutional: Negative for chills, diaphoresis, fever and malaise/fatigue.  HENT: Negative for nosebleeds and sore throat.   Eyes: Negative for double vision.  Respiratory: Negative for cough, hemoptysis, sputum production, shortness of breath and wheezing.   Cardiovascular: Negative for chest pain, palpitations, orthopnea and leg swelling.  Gastrointestinal: Negative for blood in stool, constipation, diarrhea, heartburn, melena, nausea and vomiting.  Genitourinary: Negative for dysuria, frequency and urgency.  Musculoskeletal: Negative for back pain and joint pain.  Skin: Negative.  Negative for itching and rash.  Neurological: Negative for dizziness, tingling, focal weakness, weakness and headaches.  Endo/Heme/Allergies: Does not bruise/bleed easily.  Psychiatric/Behavioral: Negative for depression. The patient is not nervous/anxious and does not have insomnia.       PAST MEDICAL HISTORY :  Past Medical History:  Diagnosis Date  . Anxiety   . Breast cancer ([place])    pT2 pN0 cM0 (stage IIA) invasive mammary carcinoma of left outer breast status post lumpectomy and sentinel node study on July 14, 2010  . CAD (coronary artery disease)   . Depression   . H/O hypokalemia   . History of chemotherapy   . History of MI (myocardial infarction)   . Hyperglycemia   . Hyperlipidemia   . Hypertension   . Peripheral neuropathy due to chemotherapy ([place])   . Personal history of chemotherapy 2011   left breast ca  . Personal history of radiation therapy 2001   left breast ca  PAST SURGICAL HISTORY :   Past Surgical History:  Procedure Laterality Date  . ABDOMINAL HYSTERECTOMY    . BREAST BIOPSY Left 2008   neg  . BREAST BIOPSY Left 03/05/2010   invasive mammary  carcinoma  . BREAST LUMPECTOMY Left 07/14/2010   INVASIVE MAMMARY CARCINOMA WITH PROMINENT INFILTRATIVE PATTERN, negative margins  . CHOLECYSTECTOMY    . COLONOSCOPY WITH PROPOFOL N/A 11/22/2015   Procedure: COLONOSCOPY WITH PROPOFOL;  Surgeon: Hulen Luster, MD;  [facility] END[facility]COPY;  Service: Gastroenterology;  Laterality: N/A;  . CORONARY ANGIOPLASTY WITH STENT PLACEMENT  2008    FAMILY HISTORY :   Family History  Problem Relation Age of Onset  . Breast cancer Sister 22  . Hypertension Mother   . Aneurysm Mother   . Heart attack Sister   . Birth defects Sister     SOCIAL HISTORY:   Social History   Tobacco Use  . Smoking status: Former Smoker    Packs/day: 0.25    Years: 25.00    Pack years: 6.25    Types: Cigarettes    Quit date: 2008    Years since quitting: 13.7  . Smokeless tobacco: Never Used  . Tobacco comment: smoking cessation materials not required  Vaping Use  . Vaping Use: Never used  Substance Use Topics  . Alcohol use: No    Comment: rare; holidays  . Drug use: No    ALLERGIES:  is allergic to tetanus toxoids and shellfish allergy.  MEDICATIONS:  Current Outpatient Medications  Medication Sig Dispense Refill  . amLODipine (NORVASC) 10 MG tablet Take 1 tablet (10 mg total) by mouth daily. 90 tablet 1  . apixaban (ELIQUIS) 5 MG TABS tablet Take 1 tablet (5 mg total) by mouth 2 (two) times daily. Start Eliquis atleast 24 hours after the last dose of lovenox; STOP lovenox when on eliquis. 60 tablet 5  . cholecalciferol (VITAMIN D) 1000 units tablet Take 1,000 Units by mouth daily.    . clindamycin-benzoyl peroxide (BENZACLIN) gel Apply 1 application topically 2 (two) times daily.   0  . enoxaparin (LOVENOX) 120 MG/0.8ML injection Inject 0.8 mLs (120 mg total) into the skin daily. 12 mL 0  . metoprolol succinate (TOPROL-XL) 25 MG 24 hr tablet Take 0.5 tablets (12.5 mg total) by mouth daily. 45 tablet 3  . omeprazole (PRILOSEC) 40 MG capsule Take 1 capsule  (40 mg total) by mouth daily. 90 capsule 0  . ondansetron (ZOFRAN) 4 MG tablet Take 1 tablet (4 mg total) by mouth every 8 (eight) hours as needed for nausea or vomiting. 20 tablet 0  . ondansetron (ZOFRAN) 8 MG tablet Take 1 tablet (8 mg total) by mouth every 8 (eight) hours as needed for nausea or vomiting. 30 tablet 1  . potassium chloride SA (KLOR-CON) 20 MEQ tablet Take 1 tablet (20 mEq total) by mouth 2 (two) times daily. 6 tablet 0  . rosuvastatin (CRESTOR) 5 MG tablet Take 5 mg by mouth daily.    Marland Kitchen telmisartan (MICARDIS) 80 MG tablet One daily 90 tablet 1  . tretinoin (RETIN-A) 0.025 % cream Apply topically at bedtime.   4  . aspirin 81 MG EC tablet Take 81 mg by mouth daily. (Patient not taking: Reported on 04/25/2020)     No current facility-administered medications for this visit.    PHYSICAL EXAMINATION: ECOG PERFORMANCE STATUS: 0 - Asymptomatic  BP 139/70 (BP Location: Left Arm, Patient Position: Sitting, Cuff Size: Large)   Pulse 64   Temp Marland Kitchen)  96.7 F (35.9 C) (Tympanic)   Resp 16   Ht $R'5\' 4"'ig$  (1.626 m)   Wt 172 lb (78 kg)   SpO2 100%   BMI 29.52 kg/m   Filed Weights   04/25/20 0909  Weight: 172 lb (78 kg)   Physical Exam HENT:     Head: Normocephalic and atraumatic.     Mouth/Throat:     Pharynx: No oropharyngeal exudate.  Eyes:     Pupils: Pupils are equal, round, and reactive to light.  Cardiovascular:     Rate and Rhythm: Normal rate and regular rhythm.  Pulmonary:     Effort: Pulmonary effort is normal. No respiratory distress.     Breath sounds: Normal breath sounds. No wheezing.  Abdominal:     General: Bowel sounds are normal. There is no distension.     Palpations: Abdomen is soft. There is no mass.     Tenderness: There is no guarding or rebound.  Musculoskeletal:        General: No tenderness. Normal range of motion.     Cervical back: Normal range of motion and neck supple.  Skin:    General: Skin is warm.  Neurological:     Mental Status:  She is alert and oriented to person, place, and time.  Psychiatric:        Mood and Affect: Affect normal.      LABORATORY DATA:  I have reviewed the data as listed    Component Value Date/Time   NA 141 04/25/2020 0852   NA 141 08/20/2015 1143   K 2.8 (L) 04/25/2020 0852   CL 103 04/25/2020 0852   CO2 29 04/25/2020 0852   GLUCOSE 139 (H) 04/25/2020 0852   GLUCOSE 134 (H) 03/01/2013 1414   BUN 7 (L) 04/25/2020 0852   BUN 12 08/20/2015 1143   CREATININE 0.66 04/25/2020 0852   CREATININE 0.71 11/14/2019 1005   CALCIUM 9.3 04/25/2020 0852   PROT 7.3 04/25/2020 0852   PROT 7.5 08/20/2015 1143   PROT 7.5 03/02/2014 1034   ALBUMIN 3.6 04/25/2020 0852   ALBUMIN 4.7 08/20/2015 1143   ALBUMIN 3.5 03/02/2014 1034   AST 20 04/25/2020 0852   AST 13 (L) 03/02/2014 1034   ALT 15 04/25/2020 0852   ALT 21 03/02/2014 1034   ALKPHOS 101 04/25/2020 0852   ALKPHOS 76 03/02/2014 1034   BILITOT 0.9 04/25/2020 0852   BILITOT 0.3 08/20/2015 1143   BILITOT 0.2 03/02/2014 1034   GFRNONAA >60 04/25/2020 0852   GFRNONAA 84 11/14/2019 1005   GFRAA >60 04/25/2020 0852   GFRAA 98 11/14/2019 1005    No results found for: SPEP, UPEP  Lab Results  Component Value Date   WBC 7.5 04/25/2020   NEUTROABS 4.3 04/25/2020   HGB 12.8 04/25/2020   HCT 37.8 04/25/2020   MCV 88.5 04/25/2020   PLT 221 04/25/2020      Chemistry      Component Value Date/Time   NA 141 04/25/2020 0852   NA 141 08/20/2015 1143   K 2.8 (L) 04/25/2020 0852   CL 103 04/25/2020 0852   CO2 29 04/25/2020 0852   BUN 7 (L) 04/25/2020 0852   BUN 12 08/20/2015 1143   CREATININE 0.66 04/25/2020 0852   CREATININE 0.71 11/14/2019 1005      Component Value Date/Time   CALCIUM 9.3 04/25/2020 0852   ALKPHOS 101 04/25/2020 0852   ALKPHOS 76 03/02/2014 1034   AST 20 04/25/2020 0852   AST 13 (L)  03/02/2014 1034   ALT 15 04/25/2020 0852   ALT 21 03/02/2014 1034   BILITOT 0.9 04/25/2020 0852   BILITOT 0.3 08/20/2015 1143    BILITOT 0.2 03/02/2014 1034       RADIOGRAPHIC STUDIES: I have personally reviewed the radiological images as listed and agreed with the findings in the report. No results found.   ASSESSMENT & PLAN:  Lesion of liver #Liver lesions noted; with multicystic lesions noted in the pancreas- concerning for malignancy versus infection/inflammation.  Liver biopsy- [second opinion at [facility]]-negative for malignancy- see discussion bellow. Given ? Autoimmune etiology- will refer to GI.   #Portal vein thrombosis- based on imaging- leading to extensive liver changes [see above]unclear etiology-we will order hypercoagulable work-up at next visit. Currently on Eliquis BID- tolerating well.    # Left breast cancer stage II ER PR positive HER-2 negative; finished 5 years in May 2017- STABLE.  # PN- G-1 on essential oils. STABLE.   # DISPOSITION:  # referral to Dr.Anna/GI re: ? Autoimmune liver disease s/p liver Bx. # Follow up in 2nd week of Jan 2022; labs- cbc/cmp;hyper-coag; Dr.B      Orders Placed This Encounter  Procedures  . Factor 5 leiden    Standing Status:   Future    Standing Expiration Date:   04/25/2021  . Prothrombin gene mutation    Standing Status:   Future    Standing Expiration Date:   04/25/2021  . Fibrin derivatives D-Dimer ([facility] only)    Standing Status:   Future    Standing Expiration Date:   04/25/2021  . ANTIPHOSPHOLIPID SYNDROME PROF    Standing Status:   Future    Standing Expiration Date:   04/25/2021   All questions were answered. The patient knows to call the clinic with any problems, questions or concerns.      Cammie Sickle, MD 04/25/2020 9:46 AM

## 2020-04-26 ENCOUNTER — Telehealth: Payer: Self-pay | Admitting: Internal Medicine

## 2020-04-26 MED ORDER — POTASSIUM CHLORIDE CRYS ER 20 MEQ PO TBCR
20.0000 meq | EXTENDED_RELEASE_TABLET | Freq: Two times a day (BID) | ORAL | 0 refills | Status: DC
Start: 2020-04-26 — End: 2020-07-29

## 2020-04-26 NOTE — Telephone Encounter (Signed)
On 9/30-spoke to patient regarding her discussion of the tumor conference; again no evidence of malignancy noted.  Patient is awaiting GI evaluation.  Discussed regarding potassium running low at 2.8; discussed regarding potassium rich foods.  Recommend K-Dur 20 twice daily for 2 weeks.Marland Kitchen

## 2020-05-29 ENCOUNTER — Other Ambulatory Visit: Payer: Self-pay

## 2020-05-29 ENCOUNTER — Ambulatory Visit: Payer: Medicare PPO | Admitting: Gastroenterology

## 2020-05-29 VITALS — BP 136/78 | HR 64 | Temp 98.2°F | Ht 64.0 in | Wt 172.0 lb

## 2020-05-29 DIAGNOSIS — K8689 Other specified diseases of pancreas: Secondary | ICD-10-CM | POA: Diagnosis not present

## 2020-05-29 DIAGNOSIS — Z8719 Personal history of other diseases of the digestive system: Secondary | ICD-10-CM | POA: Diagnosis not present

## 2020-05-29 DIAGNOSIS — R897 Abnormal histological findings in specimens from other organs, systems and tissues: Secondary | ICD-10-CM

## 2020-05-29 NOTE — Progress Notes (Signed)
Jonathon Bellows MD, MRCP(U.K) [address]  [place]  [place], [place] [address]  Main: [phone]  Fax: [phone]   Primary Care Physician: Steele Sizer, MD  Primary Gastroenterologist:  Dr. Jonathon Bellows   Chief Complaint  Patient presents with   Liver lesion    HPI: Theresa Andrews is a 74 y.o. female    Summary of history :  She was initially referred and seen on 01/31/2020 for abdominal pain.Prior history of colonic polyps.  Last colonoscopy was in 2017 and 1 tubular adenoma was resected, procedure withdrawal time was only 4 minutes.  01/05/2020 H. pylori breath test negative  She states that she has had abdominal pain since March 2021.  Epigastric in location.  Episodic 2-3 times a week.  Each episode lasts a few minutes.  Dull in nature.  Nonradiating in.  Relieved by eating.  No aggravating features.  No change with a bowel movement.  She has been taking ibuprofen for many years.  Not been on a PPI.  She recently stopped taking ibuprofen and since has been feeling much better.  No other symptoms  Interval history   01/31/2020-05/29/2020  She has a history of left breast cancer, primary hyperparathyroidism.  Questionable whether she has autoimmune liver disease liver cysts  02/19/2020 CT scan of the abdomen and pelvis with contrast demonstrated mild to moderate acute pancreatitis mainly involving the pancreatic head.  Several small cystic lesions in the pancreatic head suspicious for pancreatic pseudocysts.  Further MRI recommended  03/07/2020: MR abdomen MRCP with and without contrast demonstrated extensive inflammatory stranding about the pancreas and adjacent retroperitoneum consistent with pancreatitis similar to CT scan imaging.  Extensive multiloculated cystic change in the pancreatic head and the pancreatic duct was diffusely dilated to the pancreatic head measuring up to 1.2 cm in diameter multiple heterogeneously hypoenhancing masses of the liver parenchyma which  are ill-defined.  Appear to have increased in size compared to recent CT.  Suspicious for metastatic disease Portal vein thrombosis with cavernous transformation and periportal edema.  03/14/2020 ultrasound right upper quadrant demonstrated right hepatic parenchyma which is heterogeneous lesions were not well defined although ultrasound-guided biopsy.  Referred for CT-guided biopsy  03/14/2020 CT-guided biopsy was obtained.  03/14/2020 liver parenchyma with ectatic portal veins with herniation suggestive of organizing thrombi, periductal fibrosis with chronic lymphoplasmacytic infiltrate, hemosiderin deposition and periportal regenerative hyperplasia changes suggestive secondary to portal vein thrombosis.  No clear malignancy was noted.  No evidence of cirrhosis seen.   Presently she states that she has no abdominal pain.  She does not recollect starting any new medications recently.  She does recollect that just prior to the episode of abdominal pain she was drinking a bit more alcohol than usual.  No family history of recurrent pancreatitis.  No prior episodes of pancreatitis.  No unintentional weight loss.  Current Outpatient Medications  Medication Sig Dispense Refill   amLODipine (NORVASC) 10 MG tablet Take 1 tablet (10 mg total) by mouth daily. 90 tablet 1   apixaban (ELIQUIS) 5 MG TABS tablet Take 1 tablet (5 mg total) by mouth 2 (two) times daily. Start Eliquis atleast 24 hours after the last dose of lovenox; STOP lovenox when on eliquis. 60 tablet 5   aspirin 81 MG EC tablet Take 81 mg by mouth daily. (Patient not taking: Reported on 04/25/2020)     cholecalciferol (VITAMIN D) 1000 units tablet Take 1,000 Units by mouth daily.     clindamycin-benzoyl peroxide (BENZACLIN) gel Apply 1  application topically 2 (two) times daily.   0   enoxaparin (LOVENOX) 120 MG/0.8ML injection Inject 0.8 mLs (120 mg total) into the skin daily. (Patient not taking: Reported on 05/29/2020) 12 mL 0    metoprolol succinate (TOPROL-XL) 25 MG 24 hr tablet Take 0.5 tablets (12.5 mg total) by mouth daily. 45 tablet 3   omeprazole (PRILOSEC) 40 MG capsule Take 1 capsule (40 mg total) by mouth daily. (Patient not taking: Reported on 05/29/2020) 90 capsule 0   ondansetron (ZOFRAN) 4 MG tablet Take 1 tablet (4 mg total) by mouth every 8 (eight) hours as needed for nausea or vomiting. (Patient not taking: Reported on 05/29/2020) 20 tablet 0   ondansetron (ZOFRAN) 8 MG tablet Take 1 tablet (8 mg total) by mouth every 8 (eight) hours as needed for nausea or vomiting. (Patient not taking: Reported on 05/29/2020) 30 tablet 1   potassium chloride SA (KLOR-CON) 20 MEQ tablet Take 1 tablet (20 mEq total) by mouth 2 (two) times daily. (Patient not taking: Reported on 05/29/2020) 30 tablet 0   rosuvastatin (CRESTOR) 5 MG tablet Take 5 mg by mouth daily.     telmisartan (MICARDIS) 80 MG tablet One daily 90 tablet 1   tretinoin (RETIN-A) 0.025 % cream Apply topically at bedtime.   4   No current facility-administered medications for this visit.    Allergies as of 05/29/2020 - Review Complete 05/29/2020  Allergen Reaction Noted   Tetanus toxoids Shortness Of Breath and Rash 07/30/2017   Shellfish allergy  10/29/2014    ROS:  General: Negative for anorexia, weight loss, fever, chills, fatigue, weakness. ENT: Negative for hoarseness, difficulty swallowing , nasal congestion. CV: Negative for chest pain, angina, palpitations, dyspnea on exertion, peripheral edema.  Respiratory: Negative for dyspnea at rest, dyspnea on exertion, cough, sputum, wheezing.  GI: See history of present illness. GU:  Negative for dysuria, hematuria, urinary incontinence, urinary frequency, nocturnal urination.  Endo: Negative for unusual weight change.    Physical Examination:   BP 136/78    Pulse 64    Temp 98.2 F (36.8 C)    Ht 5\' 4"  (1.626 m)    Wt 172 lb (78 kg)    BMI 29.52 kg/m   General: Well-nourished,  well-developed in no acute distress.  Eyes: No icterus. Conjunctivae pink. Mouth: Oropharyngeal mucosa moist and pink , no lesions erythema or exudate. Lungs: Clear to auscultation bilaterally. Non-labored. Heart: Regular rate and rhythm, no murmurs rubs or gallops.  Abdomen: Bowel sounds are normal, nontender, nondistended, no hepatosplenomegaly or masses, no abdominal bruits or hernia , no rebound or guarding.   Extremities: No lower extremity edema. No clubbing or deformities. Neuro: Alert and oriented x 3.  Grossly intact. Skin: Warm and dry, no jaundice.   Psych: Alert and cooperative, normal mood and affect.   Imaging Studies: No results found.  Assessment and Plan:   ROMIE KEEBLE is a 74 y.o. y/o female here to follow-up for abdominal pain.    Since her last visit she underwent imaging and was found to have multiple cystic lesions in the liver, pancreas, dilated pancreatic duct and features of acute pancreatitis.  Also notable features of portal venous thrombosis with cavernous transformation suggestive of an old thrombosis.  No provocative medications per history that could have triggered the pancreatitis.  She does recollect drinking a bit of alcohol prior to the abdominal pain but she definitely does not have a longstanding history of excess alcohol intake.  There is no  family history of recurrent pancreatitis or prior pancreatitis.  Liver biopsy was not conclusive.  The one condition that can explain all these findings is IgG4 autoimmune disease which causes autoimmune pancreatitis as well as liver disease.  She denies any recent use of tamoxifen.  Plan 1.    Complete autoimmune liver work-up including IgG4 levels, repeat CMP, viral hepatitis work-up.  Based on results we will discuss next steps.  If entire work-up is negative may consider repeating abdominal imaging to ensure that the changes previously seen are resolving.  If yet" inconclusive then may want to request referral to  [facility] for second opinion to discuss the whole case in its entirety to see if we can find a common denominator to explain all these findings  2.  Surveillance colonoscopy for prior history of colon polyps will be discussed at next visit.   Dr Jonathon Bellows  MD,MRCP [facility]) Follow up in 2 to 3 weeks telephone visit to discuss results

## 2020-06-01 LAB — IRON,TIBC AND FERRITIN PANEL
Ferritin: 131 ng/mL (ref 15–150)
Iron Saturation: 14 % — ABNORMAL LOW (ref 15–55)
Iron: 42 ug/dL (ref 27–139)
Total Iron Binding Capacity: 301 ug/dL (ref 250–450)
UIBC: 259 ug/dL (ref 118–369)

## 2020-06-01 LAB — IMMUNOGLOBULINS A/E/G/M, SERUM
IgA/Immunoglobulin A, Serum: 274 mg/dL (ref 64–422)
IgE (Immunoglobulin E), Serum: 53 IU/mL (ref 6–495)
IgG (Immunoglobin G), Serum: 1436 mg/dL (ref 586–1602)
IgM (Immunoglobulin M), Srm: 51 mg/dL (ref 26–217)

## 2020-06-01 LAB — IGG 4: IgG, Subclass 4: 75 mg/dL (ref 2–96)

## 2020-06-01 LAB — COMPREHENSIVE METABOLIC PANEL
ALT: 22 IU/L (ref 0–32)
AST: 27 IU/L (ref 0–40)
Albumin/Globulin Ratio: 1.4 (ref 1.2–2.2)
Albumin: 3.9 g/dL (ref 3.7–4.7)
Alkaline Phosphatase: 131 IU/L — ABNORMAL HIGH (ref 44–121)
BUN/Creatinine Ratio: 15 (ref 12–28)
BUN: 9 mg/dL (ref 8–27)
Bilirubin Total: 0.4 mg/dL (ref 0.0–1.2)
CO2: 24 mmol/L (ref 20–29)
Calcium: 10.1 mg/dL (ref 8.7–10.3)
Chloride: 106 mmol/L (ref 96–106)
Creatinine, Ser: 0.61 mg/dL (ref 0.57–1.00)
GFR calc Af Amer: 104 mL/min/{1.73_m2} (ref >59)
GFR calc non Af Amer: 90 mL/min/{1.73_m2} (ref >59)
Globulin, Total: 2.7 g/dL (ref 1.5–4.5)
Glucose: 115 mg/dL — ABNORMAL HIGH (ref 65–99)
Potassium: 3.2 mmol/L — ABNORMAL LOW (ref 3.5–5.2)
Sodium: 142 mmol/L (ref 134–144)
Total Protein: 6.6 g/dL (ref 6.0–8.5)

## 2020-06-01 LAB — CELIAC DISEASE AB SCREEN W/RFX
Antigliadin Abs, IgA: 8 units (ref 0–19)
Transglutaminase IgA: <2 U/mL (ref 0–3)

## 2020-06-01 LAB — HEPATITIS B E ANTIBODY: Hep B E Ab: NEGATIVE

## 2020-06-01 LAB — ANTI-MICROSOMAL ANTIBODY LIVER / KIDNEY: LKM1 Ab: 1.2 Units (ref 0.0–20.0)

## 2020-06-01 LAB — ALPHA-1-ANTITRYPSIN: A-1 Antitrypsin: 177 mg/dL (ref 101–187)

## 2020-06-01 LAB — HEPATITIS B SURFACE ANTIBODY,QUALITATIVE: Hep B Surface Ab, Qual: NONREACTIVE

## 2020-06-01 LAB — CK: Total CK: 63 U/L (ref 32–182)

## 2020-06-01 LAB — HIV ANTIBODY (ROUTINE TESTING W REFLEX): HIV Screen 4th Generation wRfx: NONREACTIVE

## 2020-06-01 LAB — HEPATITIS A ANTIBODY, TOTAL: hep A Total Ab: NEGATIVE

## 2020-06-01 LAB — CERULOPLASMIN: Ceruloplasmin: 35.3 mg/dL (ref 19.0–39.0)

## 2020-06-01 LAB — HEPATITIS B E ANTIGEN: Hep B E Ag: NEGATIVE

## 2020-06-01 LAB — HEPATITIS B SURFACE ANTIGEN: Hepatitis B Surface Ag: NEGATIVE

## 2020-06-01 LAB — HEPATITIS B CORE ANTIBODY, TOTAL: Hep B Core Total Ab: NEGATIVE

## 2020-06-01 LAB — MITOCHONDRIAL/SMOOTH MUSCLE AB PNL
Mitochondrial Ab: <20 Units (ref 0.0–20.0)
Smooth Muscle Ab: 16 Units (ref 0–19)

## 2020-06-01 LAB — ANA: Anti Nuclear Antibody (ANA): NEGATIVE

## 2020-06-01 LAB — HEPATITIS C ANTIBODY: Hep C Virus Ab: <0.1 s/co ratio (ref 0.0–0.9)

## 2020-06-03 ENCOUNTER — Other Ambulatory Visit: Payer: Self-pay | Admitting: Family Medicine

## 2020-06-03 DIAGNOSIS — I10 Essential (primary) hypertension: Secondary | ICD-10-CM

## 2020-06-03 NOTE — Telephone Encounter (Signed)
Requested medications are due for refill today? Yes  Requested medications are on active medication list?  Yes  Last Refill:  12/07/2019  # 90 with one refill.    Future visit scheduled?  No   Notes to Clinic:   Medication failed Rx refill protocol due to abnormal potassium on 04/27/2020.  This RN noted a comment dated on 05/29/2020 that patient is not taking potassium which is on the patient's active medication list.  Patient also needs appointment.  Please review.

## 2020-06-04 ENCOUNTER — Other Ambulatory Visit: Payer: Self-pay

## 2020-06-04 DIAGNOSIS — H02889 Meibomian gland dysfunction of unspecified eye, unspecified eyelid: Secondary | ICD-10-CM | POA: Diagnosis not present

## 2020-06-04 DIAGNOSIS — Z961 Presence of intraocular lens: Secondary | ICD-10-CM | POA: Diagnosis not present

## 2020-06-04 DIAGNOSIS — H353131 Nonexudative age-related macular degeneration, bilateral, early dry stage: Secondary | ICD-10-CM | POA: Diagnosis not present

## 2020-06-04 DIAGNOSIS — H04129 Dry eye syndrome of unspecified lacrimal gland: Secondary | ICD-10-CM | POA: Diagnosis not present

## 2020-06-20 ENCOUNTER — Encounter: Payer: Self-pay | Admitting: Internal Medicine

## 2020-07-12 ENCOUNTER — Telehealth: Payer: Self-pay

## 2020-07-12 NOTE — Telephone Encounter (Signed)
-----   Message from Jonathon Bellows, MD sent at 07/11/2020  3:26 PM EST ----- Inform that all the lab work has returned back normal. Would suggest we have a telephone visit to discuss everything and see how she is doing discuss if need to do anything further

## 2020-07-25 ENCOUNTER — Inpatient Hospital Stay: Payer: Medicare PPO | Admitting: Nurse Practitioner

## 2020-07-25 ENCOUNTER — Other Ambulatory Visit: Payer: Self-pay

## 2020-07-25 NOTE — Progress Notes (Signed)
Name: Theresa Andrews   MRN: [id number]    DOB: [birth year]   Date:07/25/2020       Progress Note  Subjective  Chief Complaint  Medication  I connected with  Trilby Drummer on 07/25/20 at  9:20 AM EST by video  and verified that I am speaking with the correct person using two identifiers.  I discussed the limitations, risks, security and privacy concerns of performing an evaluation and management service by telephone and the availability of in person appointments. Staff also discussed with the patient that there may be a patient responsible charge related to this service. Patient agreed on having a virtual visit  Patient Location: at home  Provider Location: [facility] Additional Individuals present: alone   HPI  HTN with  Microalbuminuria: last  urine micro 100, she is on Telmisartan, she is not taking diuretics, potassium better but still below normal on labs done Nov 2021   Epigastric pain: started around March 2021, she is now seeing Dr. Tobi Bastos - GI, she had multiple tests done. Diagnosed with portal vein thrombosis, pancreatitis - likely alcohol induced. Had multiple labs that were negative for viral hepatitis or auto-immune hepatitis.  She states no longer feeling nauseated   Final Diagnosis    Outside consultation (5 slides labeled [id number], collection date 03/14/2020) A. Liver, biopsy: -Liver parenchyma with ectatic portal veins with herniation and foci suggestive of organized thrombi, periductal fibrosis with chronic lymphoplasmacytic infiltrate, hemosiderin deposition and periportal regenerative hyperplasia, suggestive of changes secondary to portal vein thrombosis (clinical). -Features of obstructive biliary changes. -No evidence of malignancy is seen. (See comment)     Left breast cancer: diagnosed 07/14/2020, completed suppressive therapy in 2017 under the care of Dr. Donneta Romberg  Hyperparathyroidism: under the care of Dr. Tedd Sias, last Pth was done in 2019 , today is a  virtual visit , explained the need of in person visit and repeat labs .   CAD: under the care of Dr. Gwen Pounds , last LDL was not at goal, It was 81, goal below 70. Discussed repeating levels and adjusting dose as necessary  Pre-diabetes: she denies polyphagia, polydipsia or polyuria  Portal vein thrombosis: started on Eliquis by Dr. Donneta Romberg  Patient Active Problem List   Diagnosis Date Noted  . Lesion of liver 03/11/2020  . Bradycardia 10/04/2018  . Obesity (BMI 30-39.9) 05/12/2018  . Statin myopathy 05/12/2018  . Vitamin D deficiency 01/09/2018  . Hyperparathyroidism (HCC) 01/07/2018  . Carcinoma of overlapping sites of left breast in female, estrogen receptor positive (HCC) 03/27/2016  . Hypercalcemia 08/20/2015  . Diuretic-induced hypokalemia 04/29/2015  . Abnormal finding on thyroid function test 10/29/2014  . Anxiety disorder 10/29/2014  . Breast CA (HCC) 10/29/2014  . Atherosclerosis of coronary artery 10/29/2014  . Prediabetes 10/29/2014  . Gravida 1 10/29/2014  . Hyperlipidemia 10/29/2014  . Chronic cervical pain 10/29/2014  . Disorder of peripheral nervous system 10/29/2014  . At risk for falling 10/29/2014  . Neoplasm of skin 10/29/2014  . Benign essential HTN 04/25/2014    Past Surgical History:  Procedure Laterality Date  . ABDOMINAL HYSTERECTOMY    . BREAST BIOPSY Left 2008   neg  . BREAST BIOPSY Left 03/05/2010   invasive mammary carcinoma  . BREAST LUMPECTOMY Left 07/14/2010   INVASIVE MAMMARY CARCINOMA WITH PROMINENT INFILTRATIVE PATTERN, negative margins  . CHOLECYSTECTOMY    . COLONOSCOPY WITH PROPOFOL N/A 11/22/2015   Procedure: COLONOSCOPY WITH PROPOFOL;  Surgeon: Wallace Cullens, MD;  Location: [facility] ENDOSCOPY;  Service: Gastroenterology;  Laterality: N/A;  . CORONARY ANGIOPLASTY WITH STENT PLACEMENT  2008    Family History  Problem Relation Age of Onset  . Breast cancer Sister 70  . Hypertension Mother   . Aneurysm Mother   . Heart attack Sister    . Birth defects Sister     Social History   Socioeconomic History  . Marital status: Divorced    Spouse name: Not on file  . Number of children: 3  . Years of education: some college, no degree  . Highest education level: 12th grade  Occupational History    Employer: RETIRED  Tobacco Use  . Smoking status: Former Smoker    Packs/day: 0.25    Years: 25.00    Pack years: 6.25    Types: Cigarettes    Quit date: 2008    Years since quitting: 14.0  . Smokeless tobacco: Never Used  . Tobacco comment: smoking cessation materials not required  Vaping Use  . Vaping Use: Never used  Substance and Sexual Activity  . Alcohol use: No    Comment: rare; holidays  . Drug use: No  . Sexual activity: Not Currently  Other Topics Concern  . Not on file  Social History Narrative  . Not on file   Social Determinants of Health   Financial Resource Strain: Low Risk   . Difficulty of Paying Living Expenses: Not hard at all  Food Insecurity: No Food Insecurity  . Worried About Charity fundraiser in the Last Year: Never true  . Ran Out of Food in the Last Year: Never true  Transportation Needs: No Transportation Needs  . Lack of Transportation (Medical): No  . Lack of Transportation (Non-Medical): No  Physical Activity: Sufficiently Active  . Days of Exercise per Week: 7 days  . Minutes of Exercise per Session: 30 min  Stress: No Stress Concern Present  . Feeling of Stress : Not at all  Social Connections: Socially Isolated  . Frequency of Communication with Friends and Family: More than three times a week  . Frequency of Social Gatherings with Friends and Family: Three times a week  . Attends Religious Services: Never  . Active Member of Clubs or Organizations: No  . Attends Archivist Meetings: Never  . Marital Status: Divorced  Human resources officer Violence: Not At Risk  . Fear of Current or Ex-Partner: No  . Emotionally Abused: No  . Physically Abused: No  . Sexually  Abused: No     Current Outpatient Medications:  .  amLODipine (NORVASC) 10 MG tablet, Take 1 tablet (10 mg total) by mouth daily., Disp: 90 tablet, Rfl: 1 .  apixaban (ELIQUIS) 5 MG TABS tablet, Take 1 tablet (5 mg total) by mouth 2 (two) times daily. Start Eliquis atleast 24 hours after the last dose of lovenox; STOP lovenox when on eliquis., Disp: 60 tablet, Rfl: 5 .  aspirin 81 MG EC tablet, Take 81 mg by mouth daily. (Patient not taking: Reported on 04/25/2020), Disp: , Rfl:  .  cholecalciferol (VITAMIN D) 1000 units tablet, Take 1,000 Units by mouth daily., Disp: , Rfl:  .  clindamycin-benzoyl peroxide (BENZACLIN) gel, Apply 1 application topically 2 (two) times daily. , Disp: , Rfl: 0 .  enoxaparin (LOVENOX) 120 MG/0.8ML injection, Inject 0.8 mLs (120 mg total) into the skin daily. (Patient not taking: Reported on 05/29/2020), Disp: 12 mL, Rfl: 0 .  metoprolol succinate (TOPROL-XL) 25 MG 24 hr tablet, Take 0.5 tablets (12.5 mg total)  by mouth daily., Disp: 45 tablet, Rfl: 3 .  omeprazole (PRILOSEC) 40 MG capsule, Take 1 capsule (40 mg total) by mouth daily. (Patient not taking: Reported on 05/29/2020), Disp: 90 capsule, Rfl: 0 .  ondansetron (ZOFRAN) 4 MG tablet, Take 1 tablet (4 mg total) by mouth every 8 (eight) hours as needed for nausea or vomiting. (Patient not taking: Reported on 05/29/2020), Disp: 20 tablet, Rfl: 0 .  ondansetron (ZOFRAN) 8 MG tablet, Take 1 tablet (8 mg total) by mouth every 8 (eight) hours as needed for nausea or vomiting. (Patient not taking: Reported on 05/29/2020), Disp: 30 tablet, Rfl: 1 .  potassium chloride SA (KLOR-CON) 20 MEQ tablet, Take 1 tablet (20 mEq total) by mouth 2 (two) times daily. (Patient not taking: Reported on 05/29/2020), Disp: 30 tablet, Rfl: 0 .  rosuvastatin (CRESTOR) 5 MG tablet, Take 5 mg by mouth daily., Disp: , Rfl:  .  telmisartan (MICARDIS) 80 MG tablet, One daily, Disp: 90 tablet, Rfl: 1 .  tretinoin (RETIN-A) 0.025 % cream, Apply topically  at bedtime. , Disp: , Rfl: 4  Allergies  Allergen Reactions  . Tetanus Toxoids Shortness Of Breath and Rash  . Shellfish Allergy     I personally reviewed active problem list, medication list, allergies, family history, social history, health maintenance with the patient/caregiver today.   ROS  Ten systems reviewed and is negative except as mentioned in HPI   Objective  Virtual encounter, vitals not obtained.  There is no height or weight on file to calculate BMI.  Today's Vitals   07/29/20 0952  BP: 129/78  Pulse: 71  Weight: 168 lb (76.2 kg)   Body mass index is 28.84 kg/m. Physical Exam  Awake, alert and oriented   PHQ2/9: Depression screen [facility] 2/9 01/05/2020 12/07/2019 11/07/2019 09/19/2019 05/17/2019  Decreased Interest 0 0 0 0 0  Down, Depressed, Hopeless 0 0 0 0 0  PHQ - 2 Score 0 0 0 0 0  Altered sleeping 0 0 0 - 0  Tired, decreased energy 0 0 0 - 0  Change in appetite 1 0 0 - 0  Feeling bad or failure about yourself  0 0 0 - 0  Trouble concentrating 0 0 0 - 0  Moving slowly or fidgety/restless 0 0 0 - 0  Suicidal thoughts 0 0 0 - 0  PHQ-9 Score 1 0 0 - 0  Difficult doing work/chores Not difficult at all Not difficult at all Not difficult at all - Not difficult at all  Some recent data might be hidden   PHQ-2/9 Result is negative.    Fall Risk: Fall Risk  01/05/2020 12/07/2019 11/07/2019 09/19/2019 05/17/2019  Falls in the past year? 0 0 0 0 0  Number falls in past yr: 0 0 0 0 0  Injury with Fall? - 0 0 0 0  Risk for fall due to : - - - No Fall Risks -  Follow up - Falls evaluation completed - Falls prevention discussed Falls evaluation completed     Assessment & Plan  1. Essential hypertension  - telmisartan (MICARDIS) 80 MG tablet; One daily  Dispense: 90 tablet; Refill: 1 - metoprolol succinate (TOPROL-XL) 25 MG 24 hr tablet; Take 0.5 tablets (12.5 mg total) by mouth daily.  Dispense: 45 tablet; Refill: 3 - amLODipine (NORVASC) 10 MG tablet; Take  1 tablet (10 mg total) by mouth daily.  Dispense: 90 tablet; Refill: 1 - COMPLETE METABOLIC PANEL WITH GFR  2. Portal vein thrombosis  She is now on Eliquis  3. Atherosclerosis of native coronary artery of native heart without angina pectoris  - Lipid panel  4. Vitamin D deficiency  - VITAMIN D 25 Hydroxy (Vit-D Deficiency, Fractures)  5. Hyperparathyroidism (HCC)  - Parathyroid hormone, intact (no Ca)  6. Mixed hyperlipidemia   7. Microalbuminuria  - Microalbumin / creatinine urine ratio   8. History of breast cancer   9. Prediabetes   10. Hypokalemia   11. Benign essential HTN  - metoprolol succinate (TOPROL-XL) 25 MG 24 hr tablet; Take 0.5 tablets (12.5 mg total) by mouth daily.  Dispense: 45 tablet; Refill: 3  12. Hyperglycemia  - Hemoglobin A1c  I discussed the assessment and treatment plan with the patient. The patient was provided an opportunity to ask questions and all were answered. The patient agreed with the plan and demonstrated an understanding of the instructions.   The patient was advised to call back or seek an in-person evaluation if the symptoms worsen or if the condition fails to improve as anticipated.  I provided 25  minutes of non-face-to-face time during this encounter.  Dollene Primrose, CMA

## 2020-07-29 ENCOUNTER — Telehealth (INDEPENDENT_AMBULATORY_CARE_PROVIDER_SITE_OTHER): Payer: Medicare PPO | Admitting: Family Medicine

## 2020-07-29 ENCOUNTER — Encounter: Payer: Self-pay | Admitting: Family Medicine

## 2020-07-29 ENCOUNTER — Other Ambulatory Visit: Payer: Self-pay

## 2020-07-29 VITALS — BP 129/78 | HR 71 | Wt 168.0 lb

## 2020-07-29 DIAGNOSIS — R7303 Prediabetes: Secondary | ICD-10-CM | POA: Diagnosis not present

## 2020-07-29 DIAGNOSIS — I81 Portal vein thrombosis: Secondary | ICD-10-CM | POA: Diagnosis not present

## 2020-07-29 DIAGNOSIS — R809 Proteinuria, unspecified: Secondary | ICD-10-CM | POA: Diagnosis not present

## 2020-07-29 DIAGNOSIS — E213 Hyperparathyroidism, unspecified: Secondary | ICD-10-CM

## 2020-07-29 DIAGNOSIS — Z853 Personal history of malignant neoplasm of breast: Secondary | ICD-10-CM | POA: Diagnosis not present

## 2020-07-29 DIAGNOSIS — E559 Vitamin D deficiency, unspecified: Secondary | ICD-10-CM

## 2020-07-29 DIAGNOSIS — E782 Mixed hyperlipidemia: Secondary | ICD-10-CM

## 2020-07-29 DIAGNOSIS — I2510 Atherosclerotic heart disease of native coronary artery without angina pectoris: Secondary | ICD-10-CM | POA: Diagnosis not present

## 2020-07-29 DIAGNOSIS — R739 Hyperglycemia, unspecified: Secondary | ICD-10-CM

## 2020-07-29 DIAGNOSIS — E876 Hypokalemia: Secondary | ICD-10-CM

## 2020-07-29 DIAGNOSIS — I10 Essential (primary) hypertension: Secondary | ICD-10-CM

## 2020-07-29 MED ORDER — METOPROLOL SUCCINATE ER 25 MG PO TB24: 12.5000 mg | ORAL_TABLET | Freq: Every day | ORAL | 3 refills | Status: AC

## 2020-07-29 MED ORDER — AMLODIPINE BESYLATE 10 MG PO TABS: 10.0000 mg | ORAL_TABLET | Freq: Every day | ORAL | 1 refills | Status: DC

## 2020-07-29 MED ORDER — TELMISARTAN 80 MG PO TABS: ORAL_TABLET | ORAL | 1 refills | Status: DC

## 2020-07-31 DIAGNOSIS — R195 Other fecal abnormalities: Secondary | ICD-10-CM

## 2020-08-01 NOTE — Telephone Encounter (Signed)
Looks right

## 2020-08-01 NOTE — Telephone Encounter (Signed)
Is the right lab you want to be preform

## 2020-08-08 ENCOUNTER — Inpatient Hospital Stay: Payer: Medicare PPO | Attending: Internal Medicine

## 2020-08-08 ENCOUNTER — Inpatient Hospital Stay (HOSPITAL_BASED_OUTPATIENT_CLINIC_OR_DEPARTMENT_OTHER): Payer: Medicare PPO | Admitting: Internal Medicine

## 2020-08-08 ENCOUNTER — Other Ambulatory Visit: Payer: Self-pay

## 2020-08-08 DIAGNOSIS — R0602 Shortness of breath: Secondary | ICD-10-CM | POA: Diagnosis not present

## 2020-08-08 DIAGNOSIS — I2510 Atherosclerotic heart disease of native coronary artery without angina pectoris: Secondary | ICD-10-CM | POA: Diagnosis not present

## 2020-08-08 DIAGNOSIS — R1011 Right upper quadrant pain: Secondary | ICD-10-CM | POA: Diagnosis not present

## 2020-08-08 DIAGNOSIS — I81 Portal vein thrombosis: Secondary | ICD-10-CM | POA: Diagnosis not present

## 2020-08-08 DIAGNOSIS — E876 Hypokalemia: Secondary | ICD-10-CM | POA: Insufficient documentation

## 2020-08-08 DIAGNOSIS — R809 Proteinuria, unspecified: Secondary | ICD-10-CM | POA: Diagnosis not present

## 2020-08-08 DIAGNOSIS — Z8249 Family history of ischemic heart disease and other diseases of the circulatory system: Secondary | ICD-10-CM | POA: Diagnosis not present

## 2020-08-08 DIAGNOSIS — G629 Polyneuropathy, unspecified: Secondary | ICD-10-CM | POA: Insufficient documentation

## 2020-08-08 DIAGNOSIS — M791 Myalgia, unspecified site: Secondary | ICD-10-CM | POA: Insufficient documentation

## 2020-08-08 DIAGNOSIS — Z87891 Personal history of nicotine dependence: Secondary | ICD-10-CM | POA: Diagnosis not present

## 2020-08-08 DIAGNOSIS — Z170 Estrogen receptor positive status [ER+]: Secondary | ICD-10-CM

## 2020-08-08 DIAGNOSIS — E213 Hyperparathyroidism, unspecified: Secondary | ICD-10-CM | POA: Diagnosis not present

## 2020-08-08 DIAGNOSIS — R21 Rash and other nonspecific skin eruption: Secondary | ICD-10-CM | POA: Insufficient documentation

## 2020-08-08 DIAGNOSIS — F064 Anxiety disorder due to known physiological condition: Secondary | ICD-10-CM | POA: Insufficient documentation

## 2020-08-08 DIAGNOSIS — Z7901 Long term (current) use of anticoagulants: Secondary | ICD-10-CM | POA: Diagnosis not present

## 2020-08-08 DIAGNOSIS — I10 Essential (primary) hypertension: Secondary | ICD-10-CM | POA: Diagnosis not present

## 2020-08-08 DIAGNOSIS — K769 Liver disease, unspecified: Secondary | ICD-10-CM | POA: Insufficient documentation

## 2020-08-08 DIAGNOSIS — Z853 Personal history of malignant neoplasm of breast: Secondary | ICD-10-CM | POA: Diagnosis not present

## 2020-08-08 DIAGNOSIS — Z803 Family history of malignant neoplasm of breast: Secondary | ICD-10-CM | POA: Insufficient documentation

## 2020-08-08 DIAGNOSIS — M542 Cervicalgia: Secondary | ICD-10-CM | POA: Insufficient documentation

## 2020-08-08 DIAGNOSIS — R739 Hyperglycemia, unspecified: Secondary | ICD-10-CM | POA: Diagnosis not present

## 2020-08-08 DIAGNOSIS — E559 Vitamin D deficiency, unspecified: Secondary | ICD-10-CM | POA: Diagnosis not present

## 2020-08-08 DIAGNOSIS — C50812 Malignant neoplasm of overlapping sites of left female breast: Secondary | ICD-10-CM | POA: Diagnosis not present

## 2020-08-08 LAB — CBC WITH DIFFERENTIAL/PLATELET
Abs Immature Granulocytes: 0.01 10*3/uL (ref 0.00–0.07)
Basophils Absolute: 0.1 10*3/uL (ref 0.0–0.1)
Basophils Relative: 1 %
Eosinophils Absolute: 0.5 10*3/uL (ref 0.0–0.5)
Eosinophils Relative: 8 %
HCT: 36.3 % (ref 36.0–46.0)
Hemoglobin: 11.7 g/dL — ABNORMAL LOW (ref 12.0–15.0)
Immature Granulocytes: 0 %
Lymphocytes Relative: 25 %
Lymphs Abs: 1.5 10*3/uL (ref 0.7–4.0)
MCH: 28.1 pg (ref 26.0–34.0)
MCHC: 32.2 g/dL (ref 30.0–36.0)
MCV: 87.1 fL (ref 80.0–100.0)
Monocytes Absolute: 0.5 10*3/uL (ref 0.1–1.0)
Monocytes Relative: 9 %
Neutro Abs: 3.3 10*3/uL (ref 1.7–7.7)
Neutrophils Relative %: 57 %
Platelets: 264 10*3/uL (ref 150–400)
RBC: 4.17 MIL/uL (ref 3.87–5.11)
RDW: 14.4 % (ref 11.5–15.5)
WBC: 5.8 10*3/uL (ref 4.0–10.5)
nRBC: 0 % (ref 0.0–0.2)

## 2020-08-08 LAB — COMPREHENSIVE METABOLIC PANEL
ALT: 19 U/L (ref 0–44)
AST: 26 U/L (ref 15–41)
Albumin: 3.6 g/dL (ref 3.5–5.0)
Alkaline Phosphatase: 119 U/L (ref 38–126)
Anion gap: 9 (ref 5–15)
BUN: 10 mg/dL (ref 8–23)
CO2: 25 mmol/L (ref 22–32)
Calcium: 9.9 mg/dL (ref 8.9–10.3)
Chloride: 103 mmol/L (ref 98–111)
Creatinine, Ser: 0.66 mg/dL (ref 0.44–1.00)
GFR, Estimated: >60 mL/min (ref >60)
Glucose, Bld: 149 mg/dL — ABNORMAL HIGH (ref 70–99)
Potassium: 2.8 mmol/L — ABNORMAL LOW (ref 3.5–5.1)
Sodium: 137 mmol/L (ref 135–145)
Total Bilirubin: 0.4 mg/dL (ref 0.3–1.2)
Total Protein: 7.8 g/dL (ref 6.5–8.1)

## 2020-08-08 LAB — FIBRIN DERIVATIVES D-DIMER (ARMC ONLY): Fibrin derivatives D-dimer (ARMC): 2119.78 ng/mL (FEU) — ABNORMAL HIGH (ref 0.00–499.00)

## 2020-08-08 MED ORDER — POTASSIUM CHLORIDE CRYS ER 20 MEQ PO TBCR: EXTENDED_RELEASE_TABLET | ORAL | 3 refills | Status: DC

## 2020-08-08 NOTE — Assessment & Plan Note (Signed)
#  Liver lesions noted; with multicystic lesions noted in the pancreas- concerning for malignancy versus infection/inflammation.  Liver biopsy- [second opinion at [facility]]-negative for malignancy- see discussion bellow. Given ? Autoimmune etiology- will refer to GI. Will also review at the tumor conference.   # Portal vein thrombosis- based on imaging- leading to extensive liver changes [see above] unclear etiology; awaiting on hypercoagulable work-up at next visit. Currently on Eliquis BID- tolerating well.   # severe hypokaelmia- K-2.8 [Jan 0737] - recommend KDur 20 meQ [likley need indefinite].   # Myalagis- recommend vit D 2000   # Left breast cancer stage II ER PR positive HER-2 negative; finished 5 years in May 2017; mammo [email or web address]-STABLE.   # PN- G-1 on essential oils. STABLE.   # DISPOSITION:  # follow up in 4 months- MD; labs- cbc/cmp- Dr.B

## 2020-08-08 NOTE — Progress Notes (Signed)
[place] OFFICE PROGRESS NOTE  Patient Care Team: Steele Sizer, MD as PCP - General (Family Medicine) Cammie Sickle, MD as Consulting Physician (Oncology) Corey Skains, MD as Consulting Physician (Cardiology) Gabriel Carina Betsey Holiday, MD as Consulting Physician (Endocrinology) Baxter Kail, MD as Consulting Physician (Dermatology) Anell Barr, OD as Consulting Physician (Optometry) Pieter Partridge, MD as Referring Physician (Dermatology)  Cancer Staging No matching staging information was found for the patient.   Oncology History Overview Note  # AUG 2011- LEFT BREAST CA Stage II ER/PR- Pos; her 2 NEG; s/p neo-adj ACx4- poor response; s/p lumpec [Dr.Arru]- ypT2 [2.5cm]ypsN-0/2; Taxol weekly 9/12 [sec to PN]; finished march 2012. Arimidex-May 2012; Aug 2013- changed to Aug 2013; sep 2013- Started Tam; Mammo- in April 2017.   # Declines extended anti-hormone therapy; # Feb 2017- Bone scan- Negative [hypercalcemia] ;   # AUG 2021- liver Biopsy- LIVER; BIOPSY:[[facility] II OPINION] - LIVER PARENCHYMA WITH ECTATIC PORTAL VEINS WITH HERNIATION AND FOCI SUGGESTIVE OF ORGANIZING THROMBI, PERIDUCTAL FIBROSIS WITH CHRONIC  LYMPHOPLASMACYTIC INFILTRATE, HEMOSIDERIN DEPOSITION AND PERIPORTAL  REGENERATIVE HYPERPLASIA, SUGGESTIVE OF CHANGES SECONDARY TO PORTAL VEIN  THROMBOSIS (CLINICAL). - FEATURES OF OBSTRUCTIVE BILIARY CHANGES. - NO EVIDENCE OF MALIGNANCY IS SEEN.   # Hypercalcemia- chronic [2016]; primary hyperparathyrdoisim; improved after stopping HCTZ.    # PN- G-1  DIAGNOSIS: breast cancer  STAGE:  II       ;GOALS: cure  CURRENT/MOST RECENT THERAPY: surviellance    Breast CA ([place])  10/29/2014 Initial Diagnosis   Breast CA (HCC)   Carcinoma of overlapping sites of left breast in female, estrogen receptor positive ([place])      INTERVAL HISTORY:  Theresa Andrews 75 y.o.  female pleasant patient above history of ER PR positive stage II breast cancer; liver lesions  -s/p biopsy negative for malignancy; portal vein thrombosis on Eliquis is here for follow-up.  Patient denies any worsening abdominal pain nausea vomiting.  Sometimes she feels a pleuritic discomfort under the ribs intermittently.  This is not constant.  No shortness of breath.  No cough. No blood in stools or black questions.  Review of Systems  Constitutional: Positive for malaise/fatigue. Negative for chills, diaphoresis and fever.  HENT: Negative for nosebleeds and sore throat.   Eyes: Negative for double vision.  Respiratory: Negative for cough, hemoptysis, sputum production, shortness of breath and wheezing.   Cardiovascular: Negative for chest pain, palpitations, orthopnea and leg swelling.  Gastrointestinal: Negative for blood in stool, constipation, diarrhea, heartburn, melena, nausea and vomiting.  Genitourinary: Negative for dysuria, frequency and urgency.  Musculoskeletal: Negative for back pain and joint pain.  Skin: Negative.  Negative for itching and rash.  Neurological: Negative for dizziness, tingling, focal weakness, weakness and headaches.  Endo/Heme/Allergies: Does not bruise/bleed easily.  Psychiatric/Behavioral: Negative for depression. The patient is not nervous/anxious and does not have insomnia.       PAST MEDICAL HISTORY :  Past Medical History:  Diagnosis Date  . Anxiety   . Breast cancer ([place])    pT2 pN0 cM0 (stage IIA) invasive mammary carcinoma of left outer breast status post lumpectomy and sentinel node study on July 14, 2010  . CAD (coronary artery disease)   . Depression   . H/O hypokalemia   . History of chemotherapy   . History of MI (myocardial infarction)   . Hyperglycemia   . Hyperlipidemia   . Hypertension   . Peripheral neuropathy due to chemotherapy ([place])   . Personal history of chemotherapy  2011   left breast ca  . Personal history of radiation therapy 2001   left breast ca    PAST SURGICAL HISTORY :   Past Surgical History:   Procedure Laterality Date  . ABDOMINAL HYSTERECTOMY    . BREAST BIOPSY Left 2008   neg  . BREAST BIOPSY Left 03/05/2010   invasive mammary carcinoma  . BREAST LUMPECTOMY Left 07/14/2010   INVASIVE MAMMARY CARCINOMA WITH PROMINENT INFILTRATIVE PATTERN, negative margins  . CHOLECYSTECTOMY    . COLONOSCOPY WITH PROPOFOL N/A 11/22/2015   Procedure: COLONOSCOPY WITH PROPOFOL;  Surgeon: Hulen Luster, MD;  Location[facility] ENDOSCOPY;  Service: Gastroenterology;  Laterality: N/A;  . CORONARY ANGIOPLASTY WITH STENT PLACEMENT  2008    FAMILY HISTORY :   Family History  Problem Relation Age of Onset  . Breast cancer Sister 65  . Hypertension Mother   . Aneurysm Mother   . Heart attack Sister   . Birth defects Sister     SOCIAL HISTORY:   Social History   Tobacco Use  . Smoking status: Former Smoker    Packs/day: 0.25    Years: 25.00    Pack years: 6.25    Types: Cigarettes    Quit date: 2008    Years since quitting: 14.0  . Smokeless tobacco: Never Used  . Tobacco comment: smoking cessation materials not required  Vaping Use  . Vaping Use: Never used  Substance Use Topics  . Alcohol use: No    Comment: rare; holidays  . Drug use: No    ALLERGIES:  is allergic to tetanus toxoids and shellfish allergy.  MEDICATIONS:  Current Outpatient Medications  Medication Sig Dispense Refill  . amLODipine (NORVASC) 10 MG tablet Take 1 tablet (10 mg total) by mouth daily. 90 tablet 1  . apixaban (ELIQUIS) 5 MG TABS tablet Take 1 tablet (5 mg total) by mouth 2 (two) times daily. Start Eliquis atleast 24 hours after the last dose of lovenox; STOP lovenox when on eliquis. 60 tablet 5  . aspirin 81 MG EC tablet Take 81 mg by mouth daily.    . cholecalciferol (VITAMIN D) 1000 units tablet Take 1,000 Units by mouth daily.    . clindamycin-benzoyl peroxide (BENZACLIN) gel Apply 1 application topically 2 (two) times daily.   0  . metoprolol succinate (TOPROL-XL) 25 MG 24 hr tablet Take 0.5 tablets  (12.5 mg total) by mouth daily. 45 tablet 3  . omeprazole (PRILOSEC) 40 MG capsule Take 1 capsule (40 mg total) by mouth daily. 90 capsule 0  . potassium chloride SA (KLOR-CON) 20 MEQ tablet 1 pill twice a day 60 tablet 3  . rosuvastatin (CRESTOR) 5 MG tablet Take 5 mg by mouth daily.    Marland Kitchen telmisartan (MICARDIS) 80 MG tablet One daily 90 tablet 1  . tretinoin (RETIN-A) 0.025 % cream Apply topically at bedtime.   4   No current facility-administered medications for this visit.    PHYSICAL EXAMINATION: ECOG PERFORMANCE STATUS: 0 - Asymptomatic  BP 130/86   Pulse 81   Temp 98.1 F (36.7 C) (Oral)   Resp 18   Wt 170 lb (77.1 kg)   SpO2 100%   BMI 29.18 kg/m   Filed Weights   08/08/20 1034  Weight: 170 lb (77.1 kg)   Physical Exam HENT:     Head: Normocephalic and atraumatic.     Mouth/Throat:     Pharynx: No oropharyngeal exudate.  Eyes:     Pupils: Pupils are equal, round, and  reactive to light.  Cardiovascular:     Rate and Rhythm: Normal rate and regular rhythm.  Pulmonary:     Effort: Pulmonary effort is normal. No respiratory distress.     Breath sounds: Normal breath sounds. No wheezing.  Abdominal:     General: Bowel sounds are normal. There is no distension.     Palpations: Abdomen is soft. There is no mass.     Tenderness: There is no guarding or rebound.  Musculoskeletal:        General: No tenderness. Normal range of motion.     Cervical back: Normal range of motion and neck supple.  Skin:    General: Skin is warm.  Neurological:     Mental Status: She is alert and oriented to person, place, and time.  Psychiatric:        Mood and Affect: Affect normal.      LABORATORY DATA:  I have reviewed the data as listed    Component Value Date/Time   NA 137 08/08/2020 0958   NA 142 05/29/2020 1344   K 2.8 (L) 08/08/2020 0958   CL 103 08/08/2020 0958   CO2 25 08/08/2020 0958   GLUCOSE 149 (H) 08/08/2020 0958   GLUCOSE 134 (H) 03/01/2013 1414   BUN 10  08/08/2020 0958   BUN 9 05/29/2020 1344   CREATININE 0.66 08/08/2020 0958   CREATININE 0.71 11/14/2019 1005   CALCIUM 9.9 08/08/2020 0958   PROT 7.8 08/08/2020 0958   PROT 6.6 05/29/2020 1344   PROT 7.5 03/02/2014 1034   ALBUMIN 3.6 08/08/2020 0958   ALBUMIN 3.9 05/29/2020 1344   ALBUMIN 3.5 03/02/2014 1034   AST 26 08/08/2020 0958   AST 13 (L) 03/02/2014 1034   ALT 19 08/08/2020 0958   ALT 21 03/02/2014 1034   ALKPHOS 119 08/08/2020 0958   ALKPHOS 76 03/02/2014 1034   BILITOT 0.4 08/08/2020 0958   BILITOT 0.4 05/29/2020 1344   BILITOT 0.2 03/02/2014 1034   GFRNONAA >60 08/08/2020 0958   GFRNONAA 84 11/14/2019 1005   GFRAA 104 05/29/2020 1344   GFRAA 98 11/14/2019 1005    No results found for: SPEP, UPEP  Lab Results  Component Value Date   WBC 5.8 08/08/2020   NEUTROABS 3.3 08/08/2020   HGB 11.7 (L) 08/08/2020   HCT 36.3 08/08/2020   MCV 87.1 08/08/2020   PLT 264 08/08/2020      Chemistry      Component Value Date/Time   NA 137 08/08/2020 0958   NA 142 05/29/2020 1344   K 2.8 (L) 08/08/2020 0958   CL 103 08/08/2020 0958   CO2 25 08/08/2020 0958   BUN 10 08/08/2020 0958   BUN 9 05/29/2020 1344   CREATININE 0.66 08/08/2020 0958   CREATININE 0.71 11/14/2019 1005      Component Value Date/Time   CALCIUM 9.9 08/08/2020 0958   ALKPHOS 119 08/08/2020 0958   ALKPHOS 76 03/02/2014 1034   AST 26 08/08/2020 0958   AST 13 (L) 03/02/2014 1034   ALT 19 08/08/2020 0958   ALT 21 03/02/2014 1034   BILITOT 0.4 08/08/2020 0958   BILITOT 0.4 05/29/2020 1344   BILITOT 0.2 03/02/2014 1034       RADIOGRAPHIC STUDIES: I have personally reviewed the radiological images as listed and agreed with the findings in the report. No results found.   ASSESSMENT & PLAN:  Carcinoma of overlapping sites of left breast in female, estrogen receptor positive (HCC) #Liver lesions noted; with multicystic lesions  noted in the pancreas- concerning for malignancy versus  infection/inflammation.  Liver biopsy- [second opinion at [facility]]-negative for malignancy- see discussion bellow.  Awaiting GI evaluation.  #Portal vein thrombosis- based on imaging- leading to extensive liver changes [see above]unclear etiology-we will order hypercoagulable work-pending.   Currently on Eliquis BID- tolerating well.    # Left breast cancer stage II ER PR positive HER-2 negative; finished 5 years in May 2017- STABLE.  # PN- G-1 on essential oils. STABLE.   # DISPOSITION:  # follow up in 4 months- MD; labs- cbc/cmp- Dr.B     Lesion of liver #Liver lesions noted; with multicystic lesions noted in the pancreas- concerning for malignancy versus infection/inflammation.  Liver biopsy- [second opinion at [facility]]-negative for malignancy- see discussion bellow. Given ? Autoimmune etiology- will refer to GI. Will also review at the tumor conference.   # Portal vein thrombosis- based on imaging- leading to extensive liver changes [see above] unclear etiology; awaiting on hypercoagulable work-up at next visit. Currently on Eliquis BID- tolerating well.   # severe hypokaelmia- K-2.8 [Jan 5974] - recommend KDur 20 meQ [likley need indefinite].   # Myalagis- recommend vit D 2000   # Left breast cancer stage II ER PR positive HER-2 negative; finished 5 years in May 2017; mammo july2021-STABLE.   # PN- G-1 on essential oils. STABLE.   # DISPOSITION:  # follow up in 4 months- MD; labs- cbc/cmp- Dr.B      Orders Placed This Encounter  Procedures  . CBC with Differential    Standing Status:   Future    Standing Expiration Date:   08/08/2021  . Comprehensive metabolic panel    Standing Status:   Future    Standing Expiration Date:   08/08/2021   All questions were answered. The patient knows to call the clinic with any problems, questions or concerns.      Cammie Sickle, MD 08/08/2020 1:08 PM

## 2020-08-08 NOTE — Assessment & Plan Note (Addendum)
#  Liver lesions noted; with multicystic lesions noted in the pancreas- concerning for malignancy versus infection/inflammation.  Liver biopsy- [second opinion at [facility]]-negative for malignancy- see discussion bellow.  Awaiting GI evaluation.  #Portal vein thrombosis- based on imaging- leading to extensive liver changes [see above]unclear etiology-we will order hypercoagulable work-pending.   Currently on Eliquis BID- tolerating well.    # Left breast cancer stage II ER PR positive HER-2 negative; finished 5 years in May 2017- STABLE.  # PN- G-1 on essential oils. STABLE.   # DISPOSITION:  # follow up in 4 months- MD; labs- cbc/cmp- Dr.B

## 2020-08-09 DIAGNOSIS — E213 Hyperparathyroidism, unspecified: Secondary | ICD-10-CM | POA: Diagnosis not present

## 2020-08-09 DIAGNOSIS — E559 Vitamin D deficiency, unspecified: Secondary | ICD-10-CM | POA: Diagnosis not present

## 2020-08-09 DIAGNOSIS — R809 Proteinuria, unspecified: Secondary | ICD-10-CM | POA: Diagnosis not present

## 2020-08-09 DIAGNOSIS — R739 Hyperglycemia, unspecified: Secondary | ICD-10-CM | POA: Diagnosis not present

## 2020-08-09 DIAGNOSIS — I10 Essential (primary) hypertension: Secondary | ICD-10-CM | POA: Diagnosis not present

## 2020-08-09 DIAGNOSIS — I2510 Atherosclerotic heart disease of native coronary artery without angina pectoris: Secondary | ICD-10-CM | POA: Diagnosis not present

## 2020-08-09 LAB — HEMOGLOBIN A1C
Hgb A1c MFr Bld: 6.1 % of total Hgb — ABNORMAL HIGH (ref <5.7)
Mean Plasma Glucose: 128 mg/dL
eAG (mmol/L): 7.1 mmol/L

## 2020-08-09 LAB — LIPID PANEL
Cholesterol: 130 mg/dL (ref <200)
HDL: 40 mg/dL — ABNORMAL LOW (ref >=50)
LDL Cholesterol (Calc): 76 mg/dL (calc)
Non-HDL Cholesterol (Calc): 90 mg/dL (calc) (ref <130)
Total CHOL/HDL Ratio: 3.3 (calc) (ref <5.0)
Triglycerides: 68 mg/dL (ref <150)

## 2020-08-09 LAB — COMPLETE METABOLIC PANEL WITH GFR
AG Ratio: 1.1 (calc) (ref 1.0–2.5)
ALT: 16 U/L (ref 6–29)
AST: 22 U/L (ref 10–35)
Albumin: 3.8 g/dL (ref 3.6–5.1)
Alkaline phosphatase (APISO): 127 U/L (ref 37–153)
BUN: 9 mg/dL (ref 7–25)
CO2: 27 mmol/L (ref 20–32)
Calcium: 10.3 mg/dL (ref 8.6–10.4)
Chloride: 105 mmol/L (ref 98–110)
Creat: 0.63 mg/dL (ref 0.60–0.93)
GFR, Est African American: 103 mL/min/{1.73_m2} (ref >=60)
GFR, Est Non African American: 89 mL/min/{1.73_m2} (ref >=60)
Globulin: 3.5 g/dL (calc) (ref 1.9–3.7)
Glucose, Bld: 129 mg/dL — ABNORMAL HIGH (ref 65–99)
Potassium: 3.5 mmol/L (ref 3.5–5.3)
Sodium: 141 mmol/L (ref 135–146)
Total Bilirubin: 0.4 mg/dL (ref 0.2–1.2)
Total Protein: 7.3 g/dL (ref 6.1–8.1)

## 2020-08-09 LAB — PARATHYROID HORMONE, INTACT (NO CA): PTH: 135 pg/mL — ABNORMAL HIGH (ref 14–64)

## 2020-08-09 LAB — VITAMIN D 25 HYDROXY (VIT D DEFICIENCY, FRACTURES): Vit D, 25-Hydroxy: 36 ng/mL (ref 30–100)

## 2020-08-10 LAB — MICROALBUMIN / CREATININE URINE RATIO
Creatinine, Urine: 66 mg/dL (ref 20–275)
Microalb Creat Ratio: 15 mcg/mg creat (ref <30)
Microalb, Ur: 1 mg/dL

## 2020-08-10 LAB — ANTIPHOSPHOLIPID SYNDROME PROF
Anticardiolipin IgG: <9 GPL U/mL (ref 0–14)
Anticardiolipin IgM: <9 MPL U/mL (ref 0–12)
DRVVT: 48.4 s — ABNORMAL HIGH (ref 0.0–47.0)
PTT Lupus Anticoagulant: 31.6 s (ref 0.0–51.9)

## 2020-08-10 LAB — DRVVT MIX: dRVVT Mix: 41.4 s — ABNORMAL HIGH (ref 0.0–40.4)

## 2020-08-10 LAB — DRVVT CONFIRM: dRVVT Confirm: 0.8 ratio (ref 0.8–1.2)

## 2020-08-14 LAB — PROTHROMBIN GENE MUTATION

## 2020-08-16 LAB — FACTOR 5 LEIDEN

## 2020-08-19 ENCOUNTER — Telehealth: Payer: Self-pay | Admitting: Internal Medicine

## 2020-08-19 DIAGNOSIS — C50812 Malignant neoplasm of overlapping sites of left female breast: Secondary | ICD-10-CM

## 2020-08-19 DIAGNOSIS — Z7901 Long term (current) use of anticoagulants: Secondary | ICD-10-CM

## 2020-08-19 DIAGNOSIS — I81 Portal vein thrombosis: Secondary | ICD-10-CM

## 2020-08-19 NOTE — Telephone Encounter (Signed)
On 1/24-spoke to patient regarding her concerns for multitude of complaints-neck pain/skin rash/abdominal discomfort.  Patient attributes her symptoms to Eliquis.   Clinically, less likely that Eliquis is causing her current above symptoms.  However patient is quite convinced.  Recommend holding Eliquis for now.  Given history of portal vein thrombosis patient needs to be on anticoagulation.  Recommend evaluation in the symptomatic management clinic for further evaluation/discussion.  Patient agreement.   Please schedule [facility] appointment /order labs-CBC CMP. Thanks

## 2020-08-20 ENCOUNTER — Telehealth: Payer: Self-pay

## 2020-08-20 NOTE — Telephone Encounter (Signed)
Telephoned patient to let her know that she needs labs and to be seen in [facility] for her concerns with Eliquis. Patient stated that she cannot come today, but was agreeable to tomorrow. Appointments scheduled for labs and [facility] for 08/21/20. Pt verbalized understanding.

## 2020-08-20 NOTE — Telephone Encounter (Signed)
Labs entered.  Theresa Andrews- please contact patient with apt with [facility]

## 2020-08-20 NOTE — Addendum Note (Signed)
Addended by: Gloris Ham on: 08/20/2020 08:41 AM   Modules accepted: Orders

## 2020-08-21 ENCOUNTER — Inpatient Hospital Stay: Payer: Medicare PPO

## 2020-08-21 ENCOUNTER — Inpatient Hospital Stay (HOSPITAL_BASED_OUTPATIENT_CLINIC_OR_DEPARTMENT_OTHER): Payer: Medicare PPO | Admitting: Oncology

## 2020-08-21 VITALS — BP 133/83 | HR 96 | Temp 96.0°F | Resp 16 | Wt 165.4 lb

## 2020-08-21 DIAGNOSIS — Z7901 Long term (current) use of anticoagulants: Secondary | ICD-10-CM

## 2020-08-21 DIAGNOSIS — R1011 Right upper quadrant pain: Secondary | ICD-10-CM

## 2020-08-21 DIAGNOSIS — C50812 Malignant neoplasm of overlapping sites of left female breast: Secondary | ICD-10-CM

## 2020-08-21 DIAGNOSIS — R21 Rash and other nonspecific skin eruption: Secondary | ICD-10-CM | POA: Diagnosis not present

## 2020-08-21 DIAGNOSIS — Z853 Personal history of malignant neoplasm of breast: Secondary | ICD-10-CM | POA: Diagnosis not present

## 2020-08-21 DIAGNOSIS — T7840XA Allergy, unspecified, initial encounter: Secondary | ICD-10-CM | POA: Diagnosis not present

## 2020-08-21 DIAGNOSIS — I81 Portal vein thrombosis: Secondary | ICD-10-CM

## 2020-08-21 DIAGNOSIS — M791 Myalgia, unspecified site: Secondary | ICD-10-CM | POA: Diagnosis not present

## 2020-08-21 DIAGNOSIS — G629 Polyneuropathy, unspecified: Secondary | ICD-10-CM | POA: Diagnosis not present

## 2020-08-21 DIAGNOSIS — M542 Cervicalgia: Secondary | ICD-10-CM

## 2020-08-21 DIAGNOSIS — K769 Liver disease, unspecified: Secondary | ICD-10-CM | POA: Diagnosis not present

## 2020-08-21 DIAGNOSIS — E876 Hypokalemia: Secondary | ICD-10-CM | POA: Diagnosis not present

## 2020-08-21 LAB — COMPREHENSIVE METABOLIC PANEL
ALT: 13 U/L (ref 0–44)
AST: 20 U/L (ref 15–41)
Albumin: 3.6 g/dL (ref 3.5–5.0)
Alkaline Phosphatase: 123 U/L (ref 38–126)
Anion gap: 9 (ref 5–15)
BUN: 14 mg/dL (ref 8–23)
CO2: 24 mmol/L (ref 22–32)
Calcium: 10.8 mg/dL — ABNORMAL HIGH (ref 8.9–10.3)
Chloride: 103 mmol/L (ref 98–111)
Creatinine, Ser: 0.69 mg/dL (ref 0.44–1.00)
GFR, Estimated: >60 mL/min (ref >60)
Glucose, Bld: 150 mg/dL — ABNORMAL HIGH (ref 70–99)
Potassium: 4.5 mmol/L (ref 3.5–5.1)
Sodium: 136 mmol/L (ref 135–145)
Total Bilirubin: 0.5 mg/dL (ref 0.3–1.2)
Total Protein: 8 g/dL (ref 6.5–8.1)

## 2020-08-21 LAB — CBC WITH DIFFERENTIAL/PLATELET
Abs Immature Granulocytes: 0.02 10*3/uL (ref 0.00–0.07)
Basophils Absolute: 0.1 10*3/uL (ref 0.0–0.1)
Basophils Relative: 1 %
Eosinophils Absolute: 0.4 10*3/uL (ref 0.0–0.5)
Eosinophils Relative: 6 %
HCT: 38.3 % (ref 36.0–46.0)
Hemoglobin: 12.2 g/dL (ref 12.0–15.0)
Immature Granulocytes: 0 %
Lymphocytes Relative: 26 %
Lymphs Abs: 1.8 10*3/uL (ref 0.7–4.0)
MCH: 27.5 pg (ref 26.0–34.0)
MCHC: 31.9 g/dL (ref 30.0–36.0)
MCV: 86.5 fL (ref 80.0–100.0)
Monocytes Absolute: 0.7 10*3/uL (ref 0.1–1.0)
Monocytes Relative: 10 %
Neutro Abs: 3.8 10*3/uL (ref 1.7–7.7)
Neutrophils Relative %: 57 %
Platelets: 279 10*3/uL (ref 150–400)
RBC: 4.43 MIL/uL (ref 3.87–5.11)
RDW: 14.1 % (ref 11.5–15.5)
WBC: 6.8 10*3/uL (ref 4.0–10.5)
nRBC: 0 % (ref 0.0–0.2)

## 2020-08-21 MED ORDER — RIVAROXABAN (XARELTO) VTE STARTER PACK (15 & 20 MG): ORAL_TABLET | ORAL | 0 refills | Status: DC

## 2020-08-21 NOTE — Progress Notes (Signed)
Symptom Management Consult note [facility]  [phone] [phone]  Patient Care Team: Steele Sizer, MD as PCP - General (Family Medicine) Cammie Sickle, MD as Consulting Physician (Oncology) Corey Skains, MD as Consulting Physician (Cardiology) Gabriel Carina Betsey Holiday, MD as Consulting Physician (Endocrinology) Baxter Kail, MD as Consulting Physician (Dermatology) Anell Barr, OD as Consulting Physician (Optometry) Pieter Partridge, MD as Referring Physician (Dermatology)   Name of the patient: Theresa Andrews  [id number]  [birth year]   Date of visit: 08/21/2020   Diagnosis-portal vein thrombus/history of left breast cancer  Chief complaint/ Reason for visit-Neck pain/abdominal bloating/rash   Heme/Onc history: Mrs. Ambriz is a 75 year old female with past medical history significant for hypertension, CAD, hyperparathyroidism, anxiety, hyperlipidemia, obesity who is followed by Dr. Rogue Bussing for history of left breast cancer stage II ER/PR positive and negative who finished treatment in May 2017.  Most recent mammogram on July 2021 shows negative of recurrence.  She was recently evaluated for intermittent abdominal pain and subsequent imaging showed multicystic lesions in the pancreas concerning for malignancy versus infection/inflammation.  She had an IR guided liver biopsy on 03/14/2020.  Results were negative for malignancy and thought to be autoimmune etiology.  During work-up for abdominal pain incidental findings of portal vein thrombus.  She was started on Eliquis.  She met with Dr. Vicente Males in GI for evaluation.  She is currently undergoing complete autoimmune liver work-up.  She is scheduled for follow-up in the next couple weeks.  Had hypercoagulable work-up on 08/08/2020 which appears to be negative.  D-dimer significantly elevated 2119.   Interval history-today she presents for myriad of complaints including neck pain, skin rash  and abdominal discomfort. Rash  has been intermittent since beginning Eliquis loacted on her forearms, chest and abdomen.  Rash normally happens in the morning and goes away gradually throughout the day.  No itching just redness.  She has had chronic right-sided neck pain and has been seen by several massage therapist in the past.  Symptoms appear to have worsened since beginning Eliquis.  She is currently not having any neck pain.  She also has abdominal bloating and pain.  This symptom has been present for several months and she is currently under work-up.  She is followed by GI and has an appointment in the next couple weeks.  She is currently not having any abdominal pain.  She denies any nausea or vomiting, constipation or diarrhea.  She denies any recent infections or illnesses.  Denies any respiratory concerns.  Denies chest pain.  She stopped Eliquis 2 days ago and symptoms appear to be improving.   ECOG FS:1 - Symptomatic but completely ambulatory  Review of systems- Review of Systems  Respiratory: Positive for shortness of breath.   Gastrointestinal: Positive for abdominal pain.  Musculoskeletal: Positive for neck pain.  Skin: Positive for rash (Forearms, chest and abdomen).  Psychiatric/Behavioral: The patient is nervous/anxious.      Current treatment-Eliquis for portal vein thrombus  Allergies  Allergen Reactions  . Tetanus Toxoids Shortness Of Breath and Rash  . Shellfish Allergy      Past Medical History:  Diagnosis Date  . Anxiety   . Breast cancer ([place])    pT2 pN0 cM0 (stage IIA) invasive mammary carcinoma of left outer breast status post lumpectomy and sentinel node study on July 14, 2010  . CAD (coronary artery disease)   . Depression   . H/O hypokalemia   . History of chemotherapy   .  History of MI (myocardial infarction)   . Hyperglycemia   . Hyperlipidemia   . Hypertension   . Peripheral neuropathy due to chemotherapy ([place])   . Personal history of  chemotherapy 2011   left breast ca  . Personal history of radiation therapy 2001   left breast ca     Past Surgical History:  Procedure Laterality Date  . ABDOMINAL HYSTERECTOMY    . BREAST BIOPSY Left 2008   neg  . BREAST BIOPSY Left 03/05/2010   invasive mammary carcinoma  . BREAST LUMPECTOMY Left 07/14/2010   INVASIVE MAMMARY CARCINOMA WITH PROMINENT INFILTRATIVE PATTERN, negative margins  . CHOLECYSTECTOMY    . COLONOSCOPY WITH PROPOFOL N/A 11/22/2015   Procedure: COLONOSCOPY WITH PROPOFOL;  Surgeon: Hulen Luster, MD;  Location: [facility] ENDOSCOPY;  Service: Gastroenterology;  Laterality: N/A;  . CORONARY ANGIOPLASTY WITH STENT PLACEMENT  2008    Social History   Socioeconomic History  . Marital status: Divorced    Spouse name: Not on file  . Number of children: 3  . Years of education: some college, no degree  . Highest education level: 12th grade  Occupational History    Employer: RETIRED  Tobacco Use  . Smoking status: Former Smoker    Packs/day: 0.25    Years: 25.00    Pack years: 6.25    Types: Cigarettes    Quit date: 2008    Years since quitting: 14.0  . Smokeless tobacco: Never Used  . Tobacco comment: smoking cessation materials not required  Vaping Use  . Vaping Use: Never used  Substance and Sexual Activity  . Alcohol use: No    Comment: rare; holidays  . Drug use: No  . Sexual activity: Not Currently  Other Topics Concern  . Not on file  Social History Narrative  . Not on file   Social Determinants of Health   Financial Resource Strain: Low Risk   . Difficulty of Paying Living Expenses: Not hard at all  Food Insecurity: No Food Insecurity  . Worried About Charity fundraiser in the Last Year: Never true  . Ran Out of Food in the Last Year: Never true  Transportation Needs: No Transportation Needs  . Lack of Transportation (Medical): No  . Lack of Transportation (Non-Medical): No  Physical Activity: Sufficiently Active  . Days of Exercise per  Week: 7 days  . Minutes of Exercise per Session: 30 min  Stress: No Stress Concern Present  . Feeling of Stress : Not at all  Social Connections: Socially Isolated  . Frequency of Communication with Friends and Family: More than three times a week  . Frequency of Social Gatherings with Friends and Family: Three times a week  . Attends Religious Services: Never  . Active Member of Clubs or Organizations: No  . Attends Archivist Meetings: Never  . Marital Status: Divorced  Human resources officer Violence: Not At Risk  . Fear of Current or Ex-Partner: No  . Emotionally Abused: No  . Physically Abused: No  . Sexually Abused: No    Family History  Problem Relation Age of Onset  . Breast cancer Sister 56  . Hypertension Mother   . Aneurysm Mother   . Heart attack Sister   . Birth defects Sister      Current Outpatient Medications:  .  RIVAROXABAN (XARELTO) VTE STARTER PACK (15 & 20 MG), Follow package directions: Take one $Remove'15mg'AtgyoAT$  tablet by mouth twice a day. On day 22, switch to one $Remo'20mg'pPNsw$  tablet  once a day. Take with food., Disp: 51 each, Rfl: 0 .  amLODipine (NORVASC) 10 MG tablet, Take 1 tablet (10 mg total) by mouth daily., Disp: 90 tablet, Rfl: 1 .  apixaban (ELIQUIS) 5 MG TABS tablet, Take 1 tablet (5 mg total) by mouth 2 (two) times daily. Start Eliquis atleast 24 hours after the last dose of lovenox; STOP lovenox when on eliquis., Disp: 60 tablet, Rfl: 5 .  aspirin 81 MG EC tablet, Take 81 mg by mouth daily., Disp: , Rfl:  .  cholecalciferol (VITAMIN D) 1000 units tablet, Take 1,000 Units by mouth daily., Disp: , Rfl:  .  clindamycin-benzoyl peroxide (BENZACLIN) gel, Apply 1 application topically 2 (two) times daily. , Disp: , Rfl: 0 .  metoprolol succinate (TOPROL-XL) 25 MG 24 hr tablet, Take 0.5 tablets (12.5 mg total) by mouth daily., Disp: 45 tablet, Rfl: 3 .  omeprazole (PRILOSEC) 40 MG capsule, Take 1 capsule (40 mg total) by mouth daily., Disp: 90 capsule, Rfl: 0 .   potassium chloride SA (KLOR-CON) 20 MEQ tablet, 1 pill twice a day, Disp: 60 tablet, Rfl: 3 .  rosuvastatin (CRESTOR) 5 MG tablet, Take 5 mg by mouth daily., Disp: , Rfl:  .  telmisartan (MICARDIS) 80 MG tablet, One daily, Disp: 90 tablet, Rfl: 1 .  tretinoin (RETIN-A) 0.025 % cream, Apply topically at bedtime. , Disp: , Rfl: 4  Physical exam:  Vitals:   08/21/20 0929  BP: 133/83  Pulse: 96  Resp: 16  Temp: (!) 96 F (35.6 C)  TempSrc: Tympanic  SpO2: 100%  Weight: 165 lb 6.4 oz (75 kg)   Physical Exam Constitutional:      General: Vital signs are normal.     Appearance: Normal appearance.  HENT:     Head: Normocephalic and atraumatic.  Eyes:     Pupils: Pupils are equal, round, and reactive to light.  Cardiovascular:     Rate and Rhythm: Normal rate and regular rhythm.     Heart sounds: Normal heart sounds. No murmur heard.   Pulmonary:     Effort: Pulmonary effort is normal.     Breath sounds: Normal breath sounds. No wheezing.  Abdominal:     General: Bowel sounds are normal. There is no distension.     Palpations: Abdomen is soft.     Tenderness: There is no abdominal tenderness.  Musculoskeletal:        General: No edema. Normal range of motion.     Cervical back: Normal range of motion.  Skin:    General: Skin is warm and dry.     Findings: No rash.  Neurological:     Mental Status: She is alert and oriented to person, place, and time.  Psychiatric:        Judgment: Judgment normal.      CMP Latest Ref Rng & Units 08/21/2020  Glucose 70 - 99 mg/dL 150(H)  BUN 8 - 23 mg/dL 14  Creatinine 0.44 - 1.00 mg/dL 0.69  Sodium 135 - 145 mmol/L 136  Potassium 3.5 - 5.1 mmol/L 4.5  Chloride 98 - 111 mmol/L 103  CO2 22 - 32 mmol/L 24  Calcium 8.9 - 10.3 mg/dL 10.8(H)  Total Protein 6.5 - 8.1 g/dL 8.0  Total Bilirubin 0.3 - 1.2 mg/dL 0.5  Alkaline Phos 38 - 126 U/L 123  AST 15 - 41 U/L 20  ALT 0 - 44 U/L 13   CBC Latest Ref Rng & Units 08/21/2020  WBC 4.0 -  10.5 K/uL 6.8  Hemoglobin 12.0 - 15.0 g/dL 12.2  Hematocrit 36.0 - 46.0 % 38.3  Platelets 150 - 400 K/uL 279    No images are attached to the encounter.  No results found.   Assessment and plan- Patient is a 75 y.o. female who presents for right-sided neck pain, abdominal bloating and rash on forearms, chest and abdomen.  She is on Eliquis for portal vein thrombus but has not taken it for 2 days.  History of breast cancer: -Status post completion of treatment in 2017. -Most recent mammogram from 01/30/2020 was reported BI-RADS Category 1 negative. -She is followed by Dr. Rogue Bussing annually.  Portal vein thrombus: -Discovered incidentally with work-up for abdominal pain. -She has been on Eliquis x2 months. -She has not taken her Eliquis for 2 days. -Spoke with Nuala Alpha, pharmacist with regards to switching her to Xarelto. -Given she has not been on Eliquis for 2 days, she recommends starting loading dose.  Xarelto starter pack sent to her pharmacy.  We also checked her insurance and it is comparable in price. -Patient is agreeable to switch.  Abdominal pain: -Secondary to liver lesions. -Initially started back in July 2021. -Work-up has included CT abdomen and MRI showing several liver lesions suspicious for malignancy. -Biopsy of liver was negative. -She is currently being followed by Dr. Vicente Males and undergoing rheumatology work-up. -She is currently not having abdominal pain. -Per up-to-date, abdominal pain is not listed as a SE for Eliquis.  Rash: -Forearms, chest and upper abdomen. -Normally occurs in the morning and resolves spontaneously throughout the day. -Since stopping Eliquis 2 days ago rash has disappeared. - Less then 1% patient population develop skin rash with Eliquis  Right-sided neck pain: -This is a chronic problem. -Unlikely related to Eliquis. -She is currently not having neck pain.  Health anxiety: -She admits to sometimes overthinking her symptoms  at times. -We discussed at length current work-up for her abdominal symptoms and portal vein thrombus. -She is very satisfied with her care.  Disposition: -Start Xarelto ASAP.  Rx sent to pharmacy -Return to clinic as scheduled to see Dr. Rogue Bussing. -Call clinic if symptoms worsen or fail to improve or she develops new symptoms with Xarelto.  Visit Diagnosis 1. Sensitivity to medication, initial encounter   2. Portal vein thrombosis   3. Long term current use of anticoagulant therapy   4. Rash   5. Neck pain   6. Right upper quadrant abdominal pain     Patient expressed understanding and was in agreement with this plan. She also understands that She can call clinic at any time with any questions, concerns, or complaints.   Greater than 50% was spent in counseling and coordination of care with this patient including but not limited to discussion of the relevant topics above (See A&P) including, but not limited to diagnosis and management of acute and chronic medical conditions.   Thank you for allowing me to participate in the care of this very pleasant patient.    Jacquelin Hawking, NP [facility] Cell - [phone] Pager- [phone] 08/21/2020 11:24 AM

## 2020-09-03 ENCOUNTER — Telehealth (INDEPENDENT_AMBULATORY_CARE_PROVIDER_SITE_OTHER): Payer: Medicare PPO | Admitting: Gastroenterology

## 2020-09-03 ENCOUNTER — Telehealth: Payer: Medicare PPO | Admitting: Gastroenterology

## 2020-09-03 DIAGNOSIS — R935 Abnormal findings on diagnostic imaging of other abdominal regions, including retroperitoneum: Secondary | ICD-10-CM | POA: Diagnosis not present

## 2020-09-03 DIAGNOSIS — Z8719 Personal history of other diseases of the digestive system: Secondary | ICD-10-CM

## 2020-09-03 DIAGNOSIS — R1013 Epigastric pain: Secondary | ICD-10-CM | POA: Diagnosis not present

## 2020-09-03 DIAGNOSIS — K8689 Other specified diseases of pancreas: Secondary | ICD-10-CM | POA: Diagnosis not present

## 2020-09-03 MED ORDER — SUCRALFATE 1 GM/10ML PO SUSP: 1.0000 g | Freq: Four times a day (QID) | ORAL | 3 refills | Status: DC

## 2020-09-03 NOTE — Progress Notes (Signed)
Jonathon Bellows , MD [address]  [place]  [place], [place] [address]  Main: [phone]  Fax: [phone]   Primary Care Physician: Steele Sizer, MD  Virtual Visit via Video Note  I connected with patient on 09/03/20 at 12:00 PM EST by video and verified that I am speaking with the correct person using two identifiers.   I discussed the limitations, risks, security and privacy concerns of performing an evaluation and management service by video  and the availability of in person appointments. I also discussed with the patient that there may be a patient responsible charge related to this service. The patient expressed understanding and agreed to proceed.  Location of Patient: Home Location of Provider: Home Persons involved: Patient and provider only   History of Present Illness: Abdominal pain follow up   HPI: Theresa Andrews is a 75 y.o. female  Summary of history :  She a history of left breast cancer, primary hyperparathyroidism, was initially referred and seen on 01/31/2020 for abdominal pain.Prior history of colonic polyps. Last colonoscopy was in 2017 and 1 tubular adenoma was resected, procedure withdrawal time was only 4 minutes.  01/05/2020 H. pylori breath test negative  She states that she has had abdominal pain since March 2021 likely due to Ibuprofen which she stopped taking and the pain resolved   02/19/2020 CT scan of the abdomen and pelvis with contrast demonstrated mild to moderate acute pancreatitis mainly involving the pancreatic head.  Several small cystic lesions in the pancreatic head suspicious for pancreatic pseudocysts.  Further MRI recommended  03/07/2020: MR abdomen MRCP with and without contrast demonstrated extensive inflammatory stranding about the pancreas and adjacent retroperitoneum consistent with pancreatitis similar to CT scan imaging.  Extensive multiloculated cystic change in the pancreatic head and the pancreatic duct was  diffusely dilated to the pancreatic head measuring up to 1.2 cm in diameter multiple heterogeneously hypoenhancing masses of the liver parenchyma which are ill-defined.  Appear to have increased in size compared to recent CT.  Suspicious for metastatic disease Portal vein thrombosis with cavernous transformation and periportal edema.  03/14/2020 ultrasound right upper quadrant demonstrated right hepatic parenchyma which is heterogeneous lesions were not well defined although ultrasound-guided biopsy.  Referred for CT-guided biopsy  03/14/2020 CT-guided biopsy was obtained.  03/14/2020 liver parenchyma with ectatic portal veins with herniation suggestive of organizing thrombi, periductal fibrosis with chronic lymphoplasmacytic infiltrate, hemosiderin deposition and periportal regenerative hyperplasia changes suggestive secondary to portal vein thrombosis.  No clear malignancy was noted.  No evidence of cirrhosis seen.   Interval history   05/29/2020-09/03/2020   05/29/2020: Work up for autoimmune liver disease/pancreatitis/ viral hepatitis was negative. She is on Eloquis for portal vein thromobosis, followed by Dr Rogue Bussing for breast cancer and work up of hypercoaguable state.   She states that she has been having epigastric pain for the past few weeks.  In fact it is actually a bit better the last few days.  Worse after meals.  Denies any NSAID use.  No other clear aggravating or relieving factors.  Normal bowel movements.     Current Outpatient Medications  Medication Sig Dispense Refill  . amLODipine (NORVASC) 10 MG tablet Take 1 tablet (10 mg total) by mouth daily. 90 tablet 1  . apixaban (ELIQUIS) 5 MG TABS tablet Take 1 tablet (5 mg total) by mouth 2 (two) times daily. Start Eliquis atleast 24 hours after the last dose of lovenox; STOP lovenox when on eliquis. 60 tablet 5  .  aspirin 81 MG EC tablet Take 81 mg by mouth daily.    . cholecalciferol (VITAMIN D) 1000 units tablet Take 1,000  Units by mouth daily.    . clindamycin-benzoyl peroxide (BENZACLIN) gel Apply 1 application topically 2 (two) times daily.   0  . metoprolol succinate (TOPROL-XL) 25 MG 24 hr tablet Take 0.5 tablets (12.5 mg total) by mouth daily. 45 tablet 3  . omeprazole (PRILOSEC) 40 MG capsule Take 1 capsule (40 mg total) by mouth daily. 90 capsule 0  . potassium chloride SA (KLOR-CON) 20 MEQ tablet 1 pill twice a day 60 tablet 3  . RIVAROXABAN (XARELTO) VTE STARTER PACK (15 & 20 MG) Follow package directions: Take one 15mg  tablet by mouth twice a day. On day 22, switch to one 20mg  tablet once a day. Take with food. 51 each 0  . rosuvastatin (CRESTOR) 5 MG tablet Take 5 mg by mouth daily.    Marland Kitchen telmisartan (MICARDIS) 80 MG tablet One daily 90 tablet 1  . tretinoin (RETIN-A) 0.025 % cream Apply topically at bedtime.   4   No current facility-administered medications for this visit.    Allergies as of 09/03/2020 - Review Complete 08/08/2020  Allergen Reaction Noted  . Tetanus toxoids Shortness Of Breath and Rash 07/30/2017  . Shellfish allergy  10/29/2014    Review of Systems:    All systems reviewed and negative except where noted in HPI.  General Appearance:    Alert, cooperative, no distress, appears stated age  Head:    Normocephalic, without obvious abnormality, atraumatic  Eyes:    PERRL, conjunctiva/corneas clear,  Ears:    Grossly normal hearing    Neurologic:  Grossly normal    Observations/Objective:  Labs: CMP     Component Value Date/Time   NA 136 08/21/2020 0903   NA 142 05/29/2020 1344   K 4.5 08/21/2020 0903   CL 103 08/21/2020 0903   CO2 24 08/21/2020 0903   GLUCOSE 150 (H) 08/21/2020 0903   GLUCOSE 134 (H) 03/01/2013 1414   BUN 14 08/21/2020 0903   BUN 9 05/29/2020 1344   CREATININE 0.69 08/21/2020 0903   CREATININE 0.63 08/08/2020 0000   CALCIUM 10.8 (H) 08/21/2020 0903   PROT 8.0 08/21/2020 0903   PROT 6.6 05/29/2020 1344   PROT 7.5 03/02/2014 1034   ALBUMIN 3.6  08/21/2020 0903   ALBUMIN 3.9 05/29/2020 1344   ALBUMIN 3.5 03/02/2014 1034   AST 20 08/21/2020 0903   AST 13 (L) 03/02/2014 1034   ALT 13 08/21/2020 0903   ALT 21 03/02/2014 1034   ALKPHOS 123 08/21/2020 0903   ALKPHOS 76 03/02/2014 1034   BILITOT 0.5 08/21/2020 0903   BILITOT 0.4 05/29/2020 1344   BILITOT 0.2 03/02/2014 1034   GFRNONAA >60 08/21/2020 0903   GFRNONAA 89 08/08/2020 0000   GFRAA 103 08/08/2020 0000   Lab Results  Component Value Date   WBC 6.8 08/21/2020   HGB 12.2 08/21/2020   HCT 38.3 08/21/2020   MCV 86.5 08/21/2020   PLT 279 08/21/2020    Imaging Studies: No results found.  Assessment and Plan:   Theresa Andrews is a 75 y.o. y/o female  here to follow-up for abdominal pain.  Prior evaluation has demonstrated multiple cystic lesions in the liver, pancreas with dilated pancreatic duct and features of acute pancreatitis. Also incidentally diagnosed with portal vein thrombosis with cavernous formation suggesting chronic event.  No clear precipitating factors for the pancreatitis not for the portal vein  thrombosis.  No recent use of tamoxifen.  No excess alcohol intake.  No family history of pancreatitis or recurrent pancreatitis.  Autoimmune work-up including IgG4 levels were normal.  Presently has some abdominal pain.  Postprandial.  Prior history of cholecystectomy.   Plan 1.  I will refer her to [facility] GI to obtain a second opinion to find out if there is any other reason for her to be having pancreatitis with all these features.  2.  Check serum lipase and repeat MRI of the pancreas.  3.  Carafate 4 times daily  4.  Telephone visit in 10 days if no better can consider endoscopy continue PPI     I discussed the assessment and treatment plan with the patient. The patient was provided an opportunity to ask questions and all were answered. The patient agreed with the plan and demonstrated an understanding of the instructions.   The patient was advised to  call back or seek an in-person evaluation if the symptoms worsen or if the condition fails to improve as anticipated.  I provided 15 minutes of face-to-face time during this encounter.  Dr Jonathon Bellows MD,MRCP [facility]) Gastroenterology/Hepatology Pager: [phone]   Speech recognition software was used to dictate this note.

## 2020-09-04 ENCOUNTER — Encounter: Payer: Self-pay | Admitting: Family Medicine

## 2020-09-04 ENCOUNTER — Other Ambulatory Visit: Payer: Self-pay | Admitting: Family Medicine

## 2020-09-04 ENCOUNTER — Other Ambulatory Visit: Payer: Self-pay

## 2020-09-04 DIAGNOSIS — Z8719 Personal history of other diseases of the digestive system: Secondary | ICD-10-CM

## 2020-09-04 MED ORDER — ROSUVASTATIN CALCIUM 10 MG PO TABS: 10.0000 mg | ORAL_TABLET | Freq: Every day | ORAL | 1 refills | Status: AC

## 2020-09-09 ENCOUNTER — Telehealth: Payer: Medicare PPO | Admitting: Gastroenterology

## 2020-09-09 ENCOUNTER — Other Ambulatory Visit: Payer: Self-pay

## 2020-09-10 ENCOUNTER — Telehealth: Payer: Self-pay

## 2020-09-10 NOTE — Telephone Encounter (Signed)
Patient returning phone call, please call back

## 2020-09-10 NOTE — Telephone Encounter (Signed)
Called patient to inform her of Dr. Georgeann Oppenheim recommendations. LVM to call office back.

## 2020-09-11 ENCOUNTER — Telehealth: Payer: Self-pay

## 2020-09-11 NOTE — Telephone Encounter (Signed)
Returned patients call. Informed pt of Dr. Georgeann Oppenheim recommendations. Pt verbalized understanding.

## 2020-09-11 NOTE — Telephone Encounter (Signed)
Called and informed patient of results. Provided Dr. Georgeann Oppenheim recommendation/comments. Explained to patient I will get her MRI scheduled and send referral to [facility] GI. Pt verbalized understanding.

## 2020-09-13 ENCOUNTER — Other Ambulatory Visit: Payer: Self-pay

## 2020-09-13 DIAGNOSIS — Z8719 Personal history of other diseases of the digestive system: Secondary | ICD-10-CM

## 2020-09-13 DIAGNOSIS — R935 Abnormal findings on diagnostic imaging of other abdominal regions, including retroperitoneum: Secondary | ICD-10-CM

## 2020-09-13 DIAGNOSIS — K859 Acute pancreatitis without necrosis or infection, unspecified: Secondary | ICD-10-CM

## 2020-09-13 MED ORDER — PAROXETINE HCL 10 MG PO TABS: 10.0000 mg | ORAL_TABLET | Freq: Every day | ORAL | 0 refills | Status: AC

## 2020-09-19 ENCOUNTER — Ambulatory Visit (INDEPENDENT_AMBULATORY_CARE_PROVIDER_SITE_OTHER): Payer: Medicare PPO

## 2020-09-19 DIAGNOSIS — Z0000 Encounter for general adult medical examination without abnormal findings: Secondary | ICD-10-CM | POA: Diagnosis not present

## 2020-09-19 NOTE — Patient Instructions (Signed)
Theresa Andrews , Thank you for taking time to come for your Medicare Wellness Visit. I appreciate your ongoing commitment to your health goals. Please review the following plan we discussed and let me know if I can assist you in the future.   Screening recommendations/referrals: Colonoscopy: done 11/22/15. Due 10/2020 Mammogram: done 01/30/20 Bone Density: done 01/07/18 Recommended yearly ophthalmology/optometry visit for glaucoma screening and checkup Recommended yearly dental visit for hygiene and checkup  Vaccinations: Influenza vaccine: done 04/19/20 Pneumococcal vaccine: done 05/08/16 Tdap vaccine: n/a Shingles vaccine: Shingrix discussed. Please contact your pharmacy for coverage information.  Covid-19: done 09/20/19, 10/11/19 & 06/24/20  Advanced directives: Advance directive discussed with you today. I have provided a copy for you to complete at home and have notarized. Once this is complete please bring a copy in to our office so we can scan it into your chart.  Conditions/risks identified: Recommend increasing physical activity   Next appointment: Follow up in one year for your annual wellness visit    Preventive Care 65 Years and Older, Female Preventive care refers to lifestyle choices and visits with your health care provider that can promote health and wellness. What does preventive care include?  A yearly physical exam. This is also called an annual well check.  Dental exams once or twice a year.  Routine eye exams. Ask your health care provider how often you should have your eyes checked.  Personal lifestyle choices, including:  Daily care of your teeth and gums.  Regular physical activity.  Eating a healthy diet.  Avoiding tobacco and drug use.  Limiting alcohol use.  Practicing safe sex.  Taking low-dose aspirin every day.  Taking vitamin and mineral supplements as recommended by your health care provider. What happens during an annual well check? The  services and screenings done by your health care provider during your annual well check will depend on your age, overall health, lifestyle risk factors, and family history of disease. Counseling  Your health care provider may ask you questions about your:  Alcohol use.  Tobacco use.  Drug use.  Emotional well-being.  Home and relationship well-being.  Sexual activity.  Eating habits.  History of falls.  Memory and ability to understand (cognition).  Work and work Statistician.  Reproductive health. Screening  You may have the following tests or measurements:  Height, weight, and BMI.  Blood pressure.  Lipid and cholesterol levels. These may be checked every 5 years, or more frequently if you are over 44 years old.  Skin check.  Lung cancer screening. You may have this screening every year starting at age 52 if you have a 30-pack-year history of smoking and currently smoke or have quit within the past 15 years.  Fecal occult blood test (FOBT) of the stool. You may have this test every year starting at age 48.  Flexible sigmoidoscopy or colonoscopy. You may have a sigmoidoscopy every 5 years or a colonoscopy every 10 years starting at age 28.  Hepatitis C blood test.  Hepatitis B blood test.  Sexually transmitted disease (STD) testing.  Diabetes screening. This is done by checking your blood sugar (glucose) after you have not eaten for a while (fasting). You may have this done every 1-3 years.  Bone density scan. This is done to screen for osteoporosis. You may have this done starting at age 41.  Mammogram. This may be done every 1-2 years. Talk to your health care provider about how often you should have regular mammograms. Talk with  your health care provider about your test results, treatment options, and if necessary, the need for more tests. Vaccines  Your health care provider may recommend certain vaccines, such as:  Influenza vaccine. This is recommended  every year.  Tetanus, diphtheria, and acellular pertussis (Tdap, Td) vaccine. You may need a Td booster every 10 years.  Zoster vaccine. You may need this after age 55.  Pneumococcal 13-valent conjugate (PCV13) vaccine. One dose is recommended after age 72.  Pneumococcal polysaccharide (PPSV23) vaccine. One dose is recommended after age 4. Talk to your health care provider about which screenings and vaccines you need and how often you need them. This information is not intended to replace advice given to you by your health care provider. Make sure you discuss any questions you have with your health care provider. Document Released: 08/09/2015 Document Revised: 04/01/2016 Document Reviewed: 05/14/2015 Elsevier Interactive Patient Education  2017 [place] Prevention in the Home Falls can cause injuries. They can happen to people of all ages. There are many things you can do to make your home safe and to help prevent falls. What can I do on the outside of my home?  Regularly fix the edges of walkways and driveways and fix any cracks.  Remove anything that might make you trip as you walk through a door, such as a raised step or threshold.  Trim any bushes or trees on the path to your home.  Use bright outdoor lighting.  Clear any walking paths of anything that might make someone trip, such as rocks or tools.  Regularly check to see if handrails are loose or broken. Make sure that both sides of any steps have handrails.  Any raised decks and porches should have guardrails on the edges.  Have any leaves, snow, or ice cleared regularly.  Use sand or salt on walking paths during winter.  Clean up any spills in your garage right away. This includes oil or grease spills. What can I do in the bathroom?  Use night lights.  Install grab bars by the toilet and in the tub and shower. Do not use towel bars as grab bars.  Use non-skid mats or decals in the tub or shower.  If  you need to sit down in the shower, use a plastic, non-slip stool.  Keep the floor dry. Clean up any water that spills on the floor as soon as it happens.  Remove soap buildup in the tub or shower regularly.  Attach bath mats securely with double-sided non-slip rug tape.  Do not have throw rugs and other things on the floor that can make you trip. What can I do in the bedroom?  Use night lights.  Make sure that you have a light by your bed that is easy to reach.  Do not use any sheets or blankets that are too big for your bed. They should not hang down onto the floor.  Have a firm chair that has side arms. You can use this for support while you get dressed.  Do not have throw rugs and other things on the floor that can make you trip. What can I do in the kitchen?  Clean up any spills right away.  Avoid walking on wet floors.  Keep items that you use a lot in easy-to-reach places.  If you need to reach something above you, use a strong step stool that has a grab bar.  Keep electrical cords out of the way.  Do not  use floor polish or wax that makes floors slippery. If you must use wax, use non-skid floor wax.  Do not have throw rugs and other things on the floor that can make you trip. What can I do with my stairs?  Do not leave any items on the stairs.  Make sure that there are handrails on both sides of the stairs and use them. Fix handrails that are broken or loose. Make sure that handrails are as long as the stairways.  Check any carpeting to make sure that it is firmly attached to the stairs. Fix any carpet that is loose or worn.  Avoid having throw rugs at the top or bottom of the stairs. If you do have throw rugs, attach them to the floor with carpet tape.  Make sure that you have a light switch at the top of the stairs and the bottom of the stairs. If you do not have them, ask someone to add them for you. What else can I do to help prevent falls?  Wear shoes  that:  Do not have high heels.  Have rubber bottoms.  Are comfortable and fit you well.  Are closed at the toe. Do not wear sandals.  If you use a stepladder:  Make sure that it is fully opened. Do not climb a closed stepladder.  Make sure that both sides of the stepladder are locked into place.  Ask someone to hold it for you, if possible.  Clearly mark and make sure that you can see:  Any grab bars or handrails.  First and last steps.  Where the edge of each step is.  Use tools that help you move around (mobility aids) if they are needed. These include:  Canes.  Walkers.  Scooters.  Crutches.  Turn on the lights when you go into a dark area. Replace any light bulbs as soon as they burn out.  Set up your furniture so you have a clear path. Avoid moving your furniture around.  If any of your floors are uneven, fix them.  If there are any pets around you, be aware of where they are.  Review your medicines with your doctor. Some medicines can make you feel dizzy. This can increase your chance of falling. Ask your doctor what other things that you can do to help prevent falls. This information is not intended to replace advice given to you by your health care provider. Make sure you discuss any questions you have with your health care provider. Document Released: 05/09/2009 Document Revised: 12/19/2015 Document Reviewed: 08/17/2014 Elsevier Interactive Patient Education  2017 [employer].

## 2020-09-19 NOTE — Progress Notes (Signed)
Subjective:   Theresa Andrews is a 75 y.o. female who presents for Medicare Annual (Subsequent) preventive examination.  Virtual Visit via Telephone Note  I connected with  Theresa Andrews on 09/19/20 at 10:00 AM EST by telephone and verified that I am speaking with the correct person using two identifiers.  Location: Patient: home Provider: [place] Persons participating in the virtual visit: Theresa Andrews   I discussed the limitations, risks, security and privacy concerns of performing an evaluation and management service by telephone and the availability of in person appointments. The patient expressed understanding and agreed to proceed.  Interactive audio and video telecommunications were attempted between this nurse and patient, however failed, due to patient having technical difficulties OR patient did not have access to video capability.  We continued and completed visit with audio only.  Some vital signs may be absent or patient reported.   Theresa Marker, LPN    Review of Systems     Cardiac Risk Factors include: advanced age (>29men, >8 women);dyslipidemia;hypertension;obesity (BMI >30kg/m2)     Objective:    There were no vitals filed for this visit. There is no height or weight on file to calculate BMI.  Advanced Directives 09/19/2020 08/08/2020 03/22/2020 03/14/2020 09/19/2019 01/30/2019 09/13/2018  Does Patient Have a Medical Advance Directive? No No Yes No No No No  Type of Advance Directive - Public librarian;Living will - - - -  Would patient like information on creating a medical advance directive? Yes (MAU/Ambulatory/Procedural Areas - Information given) No - Patient declined - Yes (MAU/Ambulatory/Procedural Areas - Information given) No - Patient declined No - Patient declined Yes (MAU/Ambulatory/Procedural Areas - Information given)    Current Medications (verified) Outpatient Encounter Medications as of 09/19/2020  Medication Sig  .  amLODipine (NORVASC) 10 MG tablet Take 1 tablet (10 mg total) by mouth daily.  Marland Kitchen aspirin 81 MG EC tablet Take 81 mg by mouth daily.  . cholecalciferol (VITAMIN D) 1000 units tablet Take 1,000 Units by mouth daily.  . clindamycin-benzoyl peroxide (BENZACLIN) gel Apply 1 application topically 2 (two) times daily.   . metoprolol succinate (TOPROL-XL) 25 MG 24 hr tablet Take 0.5 tablets (12.5 mg total) by mouth daily.  Marland Kitchen PARoxetine (PAXIL) 10 MG tablet Take 1 tablet (10 mg total) by mouth daily.  . potassium chloride SA (KLOR-CON) 20 MEQ tablet 1 pill twice a day  . rivaroxaban (XARELTO) 20 MG TABS tablet Take 20 mg by mouth daily with supper.  . rosuvastatin (CRESTOR) 10 MG tablet Take 1 tablet (10 mg total) by mouth daily.  Marland Kitchen telmisartan (MICARDIS) 80 MG tablet One daily  . tretinoin (RETIN-A) 0.025 % cream Apply topically at bedtime.   . [DISCONTINUED] apixaban (ELIQUIS) 5 MG TABS tablet Take 1 tablet (5 mg total) by mouth 2 (two) times daily. Start Eliquis atleast 24 hours after the last dose of lovenox; STOP lovenox when on eliquis.  . [DISCONTINUED] omeprazole (PRILOSEC) 40 MG capsule Take 1 capsule (40 mg total) by mouth daily.  . [DISCONTINUED] RIVAROXABAN (XARELTO) VTE STARTER PACK (15 & 20 MG) Follow package directions: Take one 15mg  tablet by mouth twice a day. On day 22, switch to one 20mg  tablet once a day. Take with food.  . [DISCONTINUED] sucralfate (CARAFATE) 1 GM/10ML suspension Take 10 mLs (1 g total) by mouth 4 (four) times daily.   No facility-administered encounter medications on file as of 09/19/2020.    Allergies (verified) Tetanus toxoids and Shellfish allergy  History: Past Medical History:  Diagnosis Date  . Anxiety   . Breast cancer ([place])    pT2 pN0 cM0 (stage IIA) invasive mammary carcinoma of left outer breast status post lumpectomy and sentinel node study on July 14, 2010  . CAD (coronary artery disease)   . Depression   . H/O hypokalemia   . History of  chemotherapy   . History of MI (myocardial infarction)   . Hyperglycemia   . Hyperlipidemia   . Hypertension   . Peripheral neuropathy due to chemotherapy ([place])   . Personal history of chemotherapy 2011   left breast ca  . Personal history of radiation therapy 2001   left breast ca   Past Surgical History:  Procedure Laterality Date  . ABDOMINAL HYSTERECTOMY    . BREAST BIOPSY Left 2008   neg  . BREAST BIOPSY Left 03/05/2010   invasive mammary carcinoma  . BREAST LUMPECTOMY Left 07/14/2010   INVASIVE MAMMARY CARCINOMA WITH PROMINENT INFILTRATIVE PATTERN, negative margins  . CHOLECYSTECTOMY    . COLONOSCOPY WITH PROPOFOL N/A 11/22/2015   Procedure: COLONOSCOPY WITH PROPOFOL;  Surgeon: Hulen Luster, MD;  Location: [facility];  Service: Gastroenterology;  Laterality: N/A;  . CORONARY ANGIOPLASTY WITH STENT PLACEMENT  2008   Family History  Problem Relation Age of Onset  . Breast cancer Sister 39  . Hypertension Mother   . Aneurysm Mother   . Heart attack Sister   . Birth defects Sister    Social History   Socioeconomic History  . Marital status: Divorced    Spouse name: Not on file  . Number of children: 3  . Years of education: some college, no degree  . Highest education level: 12th grade  Occupational History    Employer: RETIRED  Tobacco Use  . Smoking status: Former Smoker    Packs/day: 0.25    Years: 25.00    Pack years: 6.25    Types: Cigarettes    Quit date: 2008    Years since quitting: 14.1  . Smokeless tobacco: Never Used  . Tobacco comment: smoking cessation materials not required  Vaping Use  . Vaping Use: Never used  Substance and Sexual Activity  . Alcohol use: No    Comment: rare; holidays  . Drug use: No  . Sexual activity: Not Currently  Other Topics Concern  . Not on file  Social History Narrative   Pt lives alone   Social Determinants of Health   Financial Resource Strain: Low Risk   . Difficulty of Paying Living Expenses: Not hard  at all  Food Insecurity: No Food Insecurity  . Worried About Charity fundraiser in the Last Year: Never true  . Ran Out of Food in the Last Year: Never true  Transportation Needs: No Transportation Needs  . Lack of Transportation (Medical): No  . Lack of Transportation (Non-Medical): No  Physical Activity: Inactive  . Days of Exercise per Week: 0 days  . Minutes of Exercise per Session: 0 min  Stress: No Stress Concern Present  . Feeling of Stress : Not at all  Social Connections: Socially Isolated  . Frequency of Communication with Friends and Family: More than three times a week  . Frequency of Social Gatherings with Friends and Family: Three times a week  . Attends Religious Services: Never  . Active Member of Clubs or Organizations: No  . Attends Archivist Meetings: Never  . Marital Status: Divorced    Tobacco Counseling Counseling given: Not  Answered Comment: smoking cessation materials not required   Clinical Intake:  Pre-visit preparation completed: Yes  Pain : No/denies pain     Nutritional Risks: Nausea/ vomitting/ diarrhea (occasional nausea) Diabetes: No  How often do you need to have someone help you when you read instructions, pamphlets, or other written materials from your doctor or pharmacy?: 1 - Never    Interpreter Needed?: No  Information entered by :: Theresa Marker LPN   Activities of Daily Living In your present state of health, do you have any difficulty performing the following activities: 09/19/2020 03/14/2020  Hearing? N N  Comment declines hearing aids -  Vision? N N  Difficulty concentrating or making decisions? N N  Walking or climbing stairs? N N  Dressing or bathing? N N  Doing errands, shopping? N -  Preparing Food and eating ? N -  Using the Toilet? N -  In the past six months, have you accidently leaked urine? N -  Do you have problems with loss of bowel control? N -  Managing your Medications? N -  Managing your  Finances? N -  Housekeeping or managing your Housekeeping? N -  Some recent data might be hidden    Patient Care Team: Steele Sizer, MD as PCP - General (Family Medicine) Cammie Sickle, MD as Consulting Physician (Oncology) Corey Skains, MD as Consulting Physician (Cardiology) Gabriel Carina Betsey Holiday, MD as Consulting Physician (Endocrinology) Baxter Kail, MD as Consulting Physician (Dermatology) Anell Barr, OD as Consulting Physician (Optometry) Carolyn Stare, Helene Kelp, MD as Referring Physician (Dermatology)  Indicate any recent Medical Services you may have received from other than [facility] providers in the past year (date may be approximate).     Assessment:   This is a routine wellness examination for Theresa Andrews.  Hearing/Vision screen  Hearing Screening   125Hz  250Hz  500Hz  1000Hz  2000Hz  3000Hz  4000Hz  6000Hz  8000Hz   Right ear:           Left ear:           Comments: Pt has no difficulty hearing  Vision Screening Comments: Annual vision screenings done by Dr. Ellin Mayhew  Dietary issues and exercise activities discussed: Current Exercise Habits: The patient does not participate in regular exercise at present, Exercise limited by: None identified  Goals    . DIET - INCREASE WATER INTAKE     Recommend to drink at least 6-8 8oz glasses of water per day     . Weight (lb) < 170 lb (77.1 kg)     Pt states she would like to lose 10 lbs       Depression Screen PHQ 2/9 Scores 09/19/2020 01/05/2020 12/07/2019 11/07/2019 09/19/2019 05/17/2019 11/29/2018  PHQ - 2 Score 0 0 0 0 0 0 0  PHQ- 9 Score - 1 0 0 - 0 -    Fall Risk Fall Risk  09/19/2020 01/05/2020 12/07/2019 11/07/2019 09/19/2019  Falls in the past year? 0 0 0 0 0  Number falls in past yr: 0 0 0 0 0  Injury with Fall? 0 - 0 0 0  Risk for fall due to : No Fall Risks - - - No Fall Risks  Follow up Falls prevention discussed - Falls evaluation completed - Falls prevention discussed    FALL RISK PREVENTION PERTAINING TO THE HOME:  Any  stairs in or around the home? Yes  If so, are there any without handrails? No  Home free of loose throw rugs in walkways, pet beds, electrical cords, etc?  Yes  Adequate lighting in your home to reduce risk of falls? Yes   ASSISTIVE DEVICES UTILIZED TO PREVENT FALLS:  Life alert? No  Use of a cane, walker or w/c? No  Grab bars in the bathroom? No  Shower chair or bench in shower? No  Elevated toilet seat or a handicapped toilet? Yes   TIMED UP AND GO:  Was the test performed? No . Telephonic visit.   Cognitive Function: Normal cognitive status assessed by direct observation by this Nurse Health Advisor. No abnormalities found.       6CIT Screen 09/19/2019 09/13/2018 07/30/2017  What Year? 0 points 0 points 0 points  What month? 0 points 0 points 0 points  What time? 0 points 0 points 0 points  Count back from 20 0 points 0 points 0 points  Months in reverse 0 points 0 points 0 points  Repeat phrase 0 points 0 points 0 points  Total Score 0 0 0    Immunizations Immunization History  Administered Date(s) Administered  . Influenza, High Dose Seasonal PF 04/29/2015, 05/08/2016, 04/06/2017, 05/02/2018, 04/14/2019  . Influenza-Unspecified 05/09/2014, 05/02/2018, 04/19/2020  . PFIZER(Purple Top)SARS-COV-2 Vaccination 09/20/2019, 10/11/2019, 06/24/2020  . Pneumococcal Conjugate-13 05/09/2014  . Pneumococcal Polysaccharide-23 05/08/2016  . Zoster 07/27/2012   Tdap vaccine status: n/a due to allergy  Flu Vaccine status: Up to date  Pneumococcal vaccine status: Up to date  Covid-19 vaccine status: Completed vaccines  Qualifies for Shingles Vaccine? Yes   Zostavax completed Yes   Shingrix Completed?: No.    Education has been provided regarding the importance of this vaccine. Patient has been advised to call insurance company to determine out of pocket expense if they have not yet received this vaccine. Advised may also receive vaccine at local pharmacy or Health Dept. Verbalized  acceptance and understanding.  Screening Tests Health Maintenance  Topic Date Due  . COLONOSCOPY (Pts 45-54yrs Insurance coverage will need to be confirmed)  11/21/2020  . COVID-19 Vaccine (4 - Booster for [place] series) 12/22/2020  . MAMMOGRAM  01/29/2021  . INFLUENZA VACCINE  Completed  . Hepatitis C Screening  Completed  . PNA vac Low Risk Adult  Completed  . DEXA SCAN  Discontinued  . TETANUS/TDAP  Discontinued    Health Maintenance  There are no preventive care reminders to display for this patient.  Colorectal cancer screening: Type of screening: Colonoscopy. Completed 10/2815. Repeat every 5 years  Mammogram status: Completed 01/30/20. Repeat every year  Bone Density status: Completed 01/07/18. Results reflect: Bone density results: NORMAL. Repeat every 2 years. Pt declines repeat screening at this time.   Lung Cancer Screening: (Low Dose CT Chest recommended if Age 57-80 years, 30 pack-year currently smoking OR have quit w/in 15years.) does not qualify.   Additional Screening:  Hepatitis C Screening: does qualify; Completed 05/29/20   Vision Screening: Recommended annual ophthalmology exams for early detection of glaucoma and other disorders of the eye. Is the patient up to date with their annual eye exam?  Yes  Who is the provider or what is the name of the office in which the patient attends annual eye exams? Dr. Ellin Mayhew  Dental Screening: Recommended annual dental exams for proper oral hygiene  Community Resource Referral / Chronic Care Management: CRR required this visit?  No   CCM required this visit?  No      Plan:     I have personally reviewed and noted the following in the patient's chart:   . Medical and social  history . Use of alcohol, tobacco or illicit drugs  . Current medications and supplements . Functional ability and status . Nutritional status . Physical activity . Advanced directives . List of other physicians . Hospitalizations,  surgeries, and ER visits in previous 12 months . Vitals . Screenings to include cognitive, depression, and falls . Referrals and appointments  In addition, I have reviewed and discussed with patient certain preventive protocols, quality metrics, and best practice recommendations. A written personalized care plan for preventive services as well as general preventive health recommendations were provided to patient.     Theresa Marker, LPN   1/61/0960   Nurse Notes: pt scheduled for upcoming MRI at [facility] on 09/27/19 due to GI issues. She is also being referred to [facility] by Dr. Vicente Males and due for repeat screening colonoscopy 10/2020. Patient advised to contact Faythe Casa NP regarding aspirin use while taking xarelto; pt plans to send MyChart msg.

## 2020-09-21 ENCOUNTER — Telehealth: Payer: Self-pay | Admitting: Internal Medicine

## 2020-09-21 MED ORDER — RIVAROXABAN 20 MG PO TABS: 20.0000 mg | ORAL_TABLET | Freq: Every day | ORAL | 1 refills | Status: DC

## 2020-09-21 NOTE — Telephone Encounter (Signed)
Called in script for xarelto as per pt request.  GB

## 2020-09-25 DIAGNOSIS — Z8719 Personal history of other diseases of the digestive system: Secondary | ICD-10-CM | POA: Diagnosis not present

## 2020-09-26 ENCOUNTER — Ambulatory Visit
Admission: RE | Admit: 2020-09-26 | Discharge: 2020-09-26 | Disposition: A | Discharge status: Home / Self Care | Payer: Medicare PPO | Source: Ambulatory Visit | Attending: Gastroenterology | Admitting: Gastroenterology

## 2020-09-26 ENCOUNTER — Telehealth: Payer: Self-pay | Admitting: Gastroenterology

## 2020-09-26 ENCOUNTER — Other Ambulatory Visit: Payer: Self-pay

## 2020-09-26 ENCOUNTER — Other Ambulatory Visit: Payer: Self-pay | Admitting: Gastroenterology

## 2020-09-26 DIAGNOSIS — R935 Abnormal findings on diagnostic imaging of other abdominal regions, including retroperitoneum: Secondary | ICD-10-CM

## 2020-09-26 DIAGNOSIS — K8689 Other specified diseases of pancreas: Secondary | ICD-10-CM | POA: Diagnosis not present

## 2020-09-26 DIAGNOSIS — M47816 Spondylosis without myelopathy or radiculopathy, lumbar region: Secondary | ICD-10-CM | POA: Diagnosis not present

## 2020-09-26 DIAGNOSIS — K859 Acute pancreatitis without necrosis or infection, unspecified: Secondary | ICD-10-CM

## 2020-09-26 DIAGNOSIS — J90 Pleural effusion, not elsewhere classified: Secondary | ICD-10-CM | POA: Diagnosis not present

## 2020-09-26 DIAGNOSIS — I700 Atherosclerosis of aorta: Secondary | ICD-10-CM | POA: Diagnosis not present

## 2020-09-26 DIAGNOSIS — R195 Other fecal abnormalities: Secondary | ICD-10-CM | POA: Diagnosis not present

## 2020-09-26 LAB — LIPASE: Lipase: 36 U/L (ref 14–85)

## 2020-09-26 MED ORDER — GADOBUTROL 1 MMOL/ML IV SOLN: 7.0000 mL | Freq: Once | INTRAVENOUS | Status: DC | PRN

## 2020-09-26 NOTE — Telephone Encounter (Signed)
Spoke with Dorian Pod in the MRI dept. She states pt was only able to complete the MRCP without contrast thus they would need the order changed in order to post the result. I explained that we would place the order.

## 2020-09-26 NOTE — Telephone Encounter (Signed)
Dorian Pod from the MRI department called.  Patient was in too much pain to complete procedure. Dorian Pod needs orders for MR abdomen and MRCP without contrast.  You can reach her at [phone]

## 2020-09-26 NOTE — Telephone Encounter (Signed)
Patient could not go through MRI this morning due to pain in her stomach. She needs pain medicine for the MRI and to r/s. Please call patient [phone].

## 2020-09-26 NOTE — Telephone Encounter (Signed)
Theresa Andrews calling for the 2nd time today. Waiting on order .  Stated that order can be  Faxed  [phone]

## 2020-09-27 DIAGNOSIS — C7889 Secondary malignant neoplasm of other digestive organs: Secondary | ICD-10-CM | POA: Diagnosis not present

## 2020-09-27 DIAGNOSIS — R1013 Epigastric pain: Secondary | ICD-10-CM | POA: Diagnosis not present

## 2020-09-27 DIAGNOSIS — R000 Tachycardia, unspecified: Secondary | ICD-10-CM | POA: Diagnosis not present

## 2020-09-27 DIAGNOSIS — N6320 Unspecified lump in the left breast, unspecified quadrant: Secondary | ICD-10-CM | POA: Diagnosis not present

## 2020-09-27 DIAGNOSIS — D72829 Elevated white blood cell count, unspecified: Secondary | ICD-10-CM | POA: Diagnosis not present

## 2020-09-27 DIAGNOSIS — I81 Portal vein thrombosis: Secondary | ICD-10-CM | POA: Diagnosis not present

## 2020-09-27 DIAGNOSIS — R109 Unspecified abdominal pain: Secondary | ICD-10-CM | POA: Diagnosis not present

## 2020-09-27 DIAGNOSIS — R9431 Abnormal electrocardiogram [ECG] [EKG]: Secondary | ICD-10-CM | POA: Diagnosis not present

## 2020-09-27 DIAGNOSIS — J90 Pleural effusion, not elsewhere classified: Secondary | ICD-10-CM | POA: Diagnosis not present

## 2020-09-27 DIAGNOSIS — Z20822 Contact with and (suspected) exposure to covid-19: Secondary | ICD-10-CM | POA: Diagnosis not present

## 2020-09-27 DIAGNOSIS — J9811 Atelectasis: Secondary | ICD-10-CM | POA: Diagnosis not present

## 2020-09-27 DIAGNOSIS — N289 Disorder of kidney and ureter, unspecified: Secondary | ICD-10-CM | POA: Diagnosis not present

## 2020-09-27 DIAGNOSIS — D735 Infarction of spleen: Secondary | ICD-10-CM | POA: Diagnosis not present

## 2020-09-27 DIAGNOSIS — K8689 Other specified diseases of pancreas: Secondary | ICD-10-CM | POA: Diagnosis not present

## 2020-09-27 DIAGNOSIS — R935 Abnormal findings on diagnostic imaging of other abdominal regions, including retroperitoneum: Secondary | ICD-10-CM | POA: Diagnosis not present

## 2020-09-27 DIAGNOSIS — R911 Solitary pulmonary nodule: Secondary | ICD-10-CM | POA: Diagnosis not present

## 2020-09-28 ENCOUNTER — Encounter: Payer: Self-pay | Admitting: Family Medicine

## 2020-09-30 ENCOUNTER — Telehealth: Payer: Self-pay | Admitting: *Deleted

## 2020-09-30 ENCOUNTER — Telehealth: Payer: Self-pay | Admitting: Internal Medicine

## 2020-09-30 ENCOUNTER — Other Ambulatory Visit: Payer: Self-pay | Admitting: Internal Medicine

## 2020-09-30 LAB — GIARDIA, EIA; OVA/PARASITE: Giardia Ag, Stl: NEGATIVE

## 2020-09-30 NOTE — Telephone Encounter (Signed)
On 3/07-spoke to patient's daughter regarding her concerns her mother has ongoing abdominal pain/recent evaluation at [facility] for UTI.  Patient awaiting reevaluation with PCP on 3/08 as a follow-up of ER discharge from [facility].  Reviewed recent imaging-concerns of progressive portal vein thrombosis while on Eliquis.  Would recommend switching to Lovenox. Also reviewed that there was no obvious evidence of malignancy-based on recent biopsy.  However given the continued uncertainty of the pathologic process liver/pancreas-with imaging again tumor conference on 03/10 for further directions.  FYI-Dr.Sowles.   GB

## 2020-09-30 NOTE — Telephone Encounter (Signed)
-----   Message from [place] sent at 09/30/2020  2:35 PM EST ----- Dr. Jacinto Reap please advise?  ----- Message ----- From: Rochele Raring Sent: 09/30/2020   2:28 PM EST To: Lorrin Jackson, CMA, [place], #  Patient's daughter Amity Roes) called and stated that patient had a UTI and was hospitalized over the weekend. Daughter states that she had considerable pain "in the area of the cancer" and is on antibiotics. She would like to know if pt's appointment on 5/13 should be sooner.   Please advise, Anderson Malta

## 2020-09-30 NOTE — Progress Notes (Signed)
mdt- liver imaging review on 3/10. GB

## 2020-09-30 NOTE — Telephone Encounter (Signed)
Dr. B stated that he would reach out to the daughter to discuss patient's care.

## 2020-09-30 NOTE — Progress Notes (Signed)
Name: Theresa Andrews   MRN: [id number]    DOB: 74-11-1945   Date:10/01/2020       Progress Note  Subjective  Chief Complaint  ER Follow up 09/27/2020 Abdominal Pain  She came in with her oldest Cassell Smiles ) and youngest Richa Shor )daughters   HPI  Confusion: started acutely about 10 days ago  daughter stated that she was complete independent up to 10 days ago. She was cooking, cleaning, changing, paying her bills. She was unable to get dressed, unable to open a screen door, difficulty putting a mask on, followed by urinary incontinence., lack of appetite, chocking when she drinks or eats, vomiting bile , she is unable to rest at night due to abdominal pain that is on right upper quadrant. Last bowel movement was 3 days ago and it was very dark. She has been having difficulty keeping pills down. She was taken to [facility] on 09/27/2020  Reviewed records from [place]. Urine looked contaminated , she was sent home on antibiotics and potassium,  continue Xarelto and follow up with [place]  Dr. Rogue Bussing reached out to me today, he reviewed records, seems like cancer is stable, however progression of portal vein thrombosis while on Xarelto and advised to change to Lovenox 1 mg/Kg per day. He will discuss the  case at the tumor conference this week and follow up with her in about 10 days  She had a fall on Saturday on 09/28/2020 and feel forward, hit her mouth on her dresser. Daughter removed rug that was in her room.     Patient Active Problem List   Diagnosis Date Noted  . Lesion of liver 03/11/2020  . Bradycardia 10/04/2018  . Obesity (BMI 30-39.9) 05/12/2018  . Statin myopathy 05/12/2018  . Vitamin D deficiency 01/09/2018  . Hyperparathyroidism ([place]) 01/07/2018  . Carcinoma of overlapping sites of left breast in female, estrogen receptor positive ([place]) 03/27/2016  . Hypercalcemia 08/20/2015  . Diuretic-induced hypokalemia 04/29/2015  . Abnormal finding on thyroid function test  10/29/2014  . Anxiety disorder 10/29/2014  . Breast CA ([place]) 10/29/2014  . Atherosclerosis of coronary artery 10/29/2014  . Prediabetes 10/29/2014  . Gravida 1 10/29/2014  . Hyperlipidemia 10/29/2014  . Disorder of peripheral nervous system 10/29/2014  . At risk for falling 10/29/2014  . Neoplasm of skin 10/29/2014  . Benign essential HTN 04/25/2014    Past Surgical History:  Procedure Laterality Date  . ABDOMINAL HYSTERECTOMY    . BREAST BIOPSY Left 2008   neg  . BREAST BIOPSY Left 03/05/2010   invasive mammary carcinoma  . BREAST LUMPECTOMY Left 07/14/2010   INVASIVE MAMMARY CARCINOMA WITH PROMINENT INFILTRATIVE PATTERN, negative margins  . CHOLECYSTECTOMY    . COLONOSCOPY WITH PROPOFOL N/A 11/22/2015   Procedure: COLONOSCOPY WITH PROPOFOL;  Surgeon: Hulen Luster, MD;  Location: [facility] ENDOSCOPY;  Service: Gastroenterology;  Laterality: N/A;  . CORONARY ANGIOPLASTY WITH STENT PLACEMENT  2008    Family History  Problem Relation Age of Onset  . Breast cancer Sister 43  . Hypertension Mother   . Aneurysm Mother   . Heart attack Sister   . Birth defects Sister     Social History   Tobacco Use  . Smoking status: Former Smoker    Packs/day: 0.25    Years: 25.00    Pack years: 6.25    Types: Cigarettes    Quit date: 2008    Years since quitting: 14.1  . Smokeless tobacco: Never Used  .  Tobacco comment: smoking cessation materials not required  Substance Use Topics  . Alcohol use: No    Comment: rare; holidays     Current Outpatient Medications:  .  amLODipine (NORVASC) 10 MG tablet, Take 1 tablet (10 mg total) by mouth daily., Disp: 90 tablet, Rfl: 1 .  aspirin 81 MG EC tablet, Take 81 mg by mouth daily., Disp: , Rfl:  .  cholecalciferol (VITAMIN D) 1000 units tablet, Take 1,000 Units by mouth daily., Disp: , Rfl:  .  enoxaparin (LOVENOX) 40 MG/0.4ML injection, Inject 0.4 mLs (40 mg total) into the skin daily., Disp: 24 mL, Rfl: 0 .  metoprolol succinate (TOPROL-XL)  25 MG 24 hr tablet, Take 0.5 tablets (12.5 mg total) by mouth daily., Disp: 45 tablet, Rfl: 3 .  PARoxetine (PAXIL) 10 MG tablet, Take 1 tablet (10 mg total) by mouth daily., Disp: 30 tablet, Rfl: 0 .  potassium chloride SA (KLOR-CON) 20 MEQ tablet, 1 pill twice a day, Disp: 60 tablet, Rfl: 3 .  rosuvastatin (CRESTOR) 10 MG tablet, Take 1 tablet (10 mg total) by mouth daily., Disp: 90 tablet, Rfl: 1 .  telmisartan (MICARDIS) 80 MG tablet, One daily, Disp: 90 tablet, Rfl: 1 .  tretinoin (RETIN-A) 0.025 % cream, Apply topically at bedtime. , Disp: , Rfl: 4  Allergies  Allergen Reactions  . Tetanus Toxoids Shortness Of Breath and Rash  . Shellfish Allergy     I personally reviewed active problem list, medication list, allergies, family history, social history with the patient/caregiver today.   ROS  Ten systems reviewed and is negative except as mentioned in HPI   Objective  Vitals:   10/01/20 1309  BP: 136/86  Pulse: 98  Resp: 16  Temp: 98.1 F (36.7 C)  TempSrc: Oral  SpO2: 98%  Weight: 167 lb (75.8 kg)  Height: 5\' 4"  (1.626 m)    Body mass index is 28.67 kg/m.  Physical Exam  Constitutional: Patient appears well-developed and well-nourished. Overweight. No distress.  HEENT: head atraumatic, normocephalic, pupils equal and reactive to light,neck supple Cardiovascular: regular rhythm and normal heart sounds.  No murmur heard. No BLE edema. Pulmonary/Chest: Effort normal and breath sounds normal. No respiratory distress. Abdominal: Soft.  There is RUQ  Tenderness, she was not able to go up on the table, exam done while sitting down. Psychiatric: Patient has a normal mood and affect.she seemed confused at times and daughter's had to answer most of the questions, she also said something was on her stomach and it was her umbilicus " I never had it before" Neurological: normal cranial nerves, healing laceration lower lip from recent fall, right arm drip, grip 5/5, seemed to be  weaker on right leg while walking   Recent Results (from the past 2160 hour(s))  Lipid panel     Status: Abnormal   Collection Time: 08/08/20 12:00 AM  Result Value Ref Range   Cholesterol 130 <200 mg/dL   HDL 40 (L) > OR = 50 mg/dL   Triglycerides 68 <150 mg/dL   LDL Cholesterol (Calc) 76 mg/dL (calc)    Comment: Reference range: <100 . Desirable range <100 mg/dL for primary prevention;   <70 mg/dL for patients with CHD or diabetic patients  with > or = 2 CHD risk factors. Marland Kitchen LDL-C is now calculated using the Martin-Hopkins  calculation, which is a validated novel method providing  better accuracy than the Friedewald equation in the  estimation of LDL-C.  Cresenciano Genre et al. Annamaria Helling. 6767;209(47): [phone]  (  http://education.QuestDiagnostics.com/faq/FAQ164)    Total CHOL/HDL Ratio 3.3 <5.0 (calc)   Non-HDL Cholesterol (Calc) 90 <130 mg/dL (calc)    Comment: For patients with diabetes plus 1 major ASCVD risk  factor, treating to a non-HDL-C goal of <100 mg/dL  (LDL-C of <70 mg/dL) is considered a therapeutic  option.   COMPLETE METABOLIC PANEL WITH GFR     Status: Abnormal   Collection Time: 08/08/20 12:00 AM  Result Value Ref Range   Glucose, Bld 129 (H) 65 - 99 mg/dL    Comment: .            Fasting reference interval . For someone without known diabetes, a glucose value >125 mg/dL indicates that they may have diabetes and this should be confirmed with a follow-up test. .    BUN 9 7 - 25 mg/dL   Creat 0.63 0.60 - 0.93 mg/dL    Comment: For patients >47 years of age, the reference limit for Creatinine is approximately 13% higher for people identified as African-American. .    GFR, Est Non African American 89 > OR = 60 mL/min/1.3m2   GFR, Est African American 103 > OR = 60 mL/min/1.13m2   BUN/Creatinine Ratio NOT APPLICABLE 6 - 22 (calc)   Sodium 141 135 - 146 mmol/L   Potassium 3.5 3.5 - 5.3 mmol/L   Chloride 105 98 - 110 mmol/L   CO2 27 20 - 32 mmol/L   Calcium  10.3 8.6 - 10.4 mg/dL   Total Protein 7.3 6.1 - 8.1 g/dL   Albumin 3.8 3.6 - 5.1 g/dL   Globulin 3.5 1.9 - 3.7 g/dL (calc)   AG Ratio 1.1 1.0 - 2.5 (calc)   Total Bilirubin 0.4 0.2 - 1.2 mg/dL   Alkaline phosphatase (APISO) 127 37 - 153 U/L   AST 22 10 - 35 U/L   ALT 16 6 - 29 U/L  Hemoglobin A1c     Status: Abnormal   Collection Time: 08/08/20 12:00 AM  Result Value Ref Range   Hgb A1c MFr Bld 6.1 (H) <5.7 % of total Hgb    Comment: For someone without known diabetes, a hemoglobin  A1c value between 5.7% and 6.4% is consistent with prediabetes and should be confirmed with a  follow-up test. . For someone with known diabetes, a value <7% indicates that their diabetes is well controlled. A1c targets should be individualized based on duration of diabetes, age, comorbid conditions, and other considerations. . This assay result is consistent with an increased risk of diabetes. . Currently, no consensus exists regarding use of hemoglobin A1c for diagnosis of diabetes for children. .    Mean Plasma Glucose 128 mg/dL   eAG (mmol/L) 7.1 mmol/L  VITAMIN D 25 Hydroxy (Vit-D Deficiency, Fractures)     Status: None   Collection Time: 08/08/20 12:00 AM  Result Value Ref Range   Vit D, 25-Hydroxy 36 30 - 100 ng/mL    Comment: Vitamin D Status         25-OH Vitamin D: . Deficiency:                    <20 ng/mL Insufficiency:             20 - 29 ng/mL Optimal:                 > or = 30 ng/mL . For 25-OH Vitamin D testing on patients on  D2-supplementation and patients for whom quantitation  of D2 and D3  fractions is required, the QuestAssureD(TM) 25-OH VIT D, (D2,D3), LC/MS/MS is recommended: order  code [phone] (patients >39yrs). See Note 1 . Note 1 . For additional information, please refer to  http://education.Quest[facility].com/faq/FAQ199  (This link is being provided for informational/ educational purposes only.)   Parathyroid hormone, intact (no Ca)     Status: Abnormal    Collection Time: 08/08/20 12:00 AM  Result Value Ref Range   PTH 135 (H) 14 - 64 pg/mL    Comment: . Interpretive Guide    Intact PTH           Calcium ------------------    ----------           ------- Normal Parathyroid    Normal               Normal Hypoparathyroidism    Low or Low Normal    Low Hyperparathyroidism    Primary            Normal or High       High    Secondary          High                 Normal or Low    Tertiary           High                 High Non-Parathyroid    Hypercalcemia      Low or Low Normal    High .   Comprehensive metabolic panel     Status: Abnormal   Collection Time: 08/08/20  9:58 AM  Result Value Ref Range   Sodium 137 135 - 145 mmol/L   Potassium 2.8 (L) 3.5 - 5.1 mmol/L   Chloride 103 98 - 111 mmol/L   CO2 25 22 - 32 mmol/L   Glucose, Bld 149 (H) 70 - 99 mg/dL    Comment: Glucose reference range applies only to samples taken after fasting for at least 8 hours.   BUN 10 8 - 23 mg/dL   Creatinine, Ser 0.66 0.44 - 1.00 mg/dL   Calcium 9.9 8.9 - 10.3 mg/dL   Total Protein 7.8 6.5 - 8.1 g/dL   Albumin 3.6 3.5 - 5.0 g/dL   AST 26 15 - 41 U/L   ALT 19 0 - 44 U/L   Alkaline Phosphatase 119 38 - 126 U/L   Total Bilirubin 0.4 0.3 - 1.2 mg/dL   GFR, Estimated >60 >60 mL/min    Comment: (NOTE) Calculated using the CKD-EPI Creatinine Equation (2021)    Anion gap 9 5 - 15    Comment: Performed at [facility], [address] [place], [place] [address]  CBC with Differential     Status: Abnormal   Collection Time: 08/08/20  9:58 AM  Result Value Ref Range   WBC 5.8 4.0 - 10.5 K/uL   RBC 4.17 3.87 - 5.11 MIL/uL   Hemoglobin 11.7 (L) 12.0 - 15.0 g/dL   HCT 36.3 36.0 - 46.0 %   MCV 87.1 80.0 - 100.0 fL   MCH 28.1 26.0 - 34.0 pg   MCHC 32.2 30.0 - 36.0 g/dL   RDW 14.4 11.5 - 15.5 %   Platelets 264 150 - 400 K/uL   nRBC 0.0 0.0 - 0.2 %   Neutrophils Relative % 57 %   Neutro Abs 3.3 1.7 - 7.7 K/uL   Lymphocytes Relative 25 %    Lymphs Abs 1.5 0.7 -  4.0 K/uL   Monocytes Relative 9 %   Monocytes Absolute 0.5 0.1 - 1.0 K/uL   Eosinophils Relative 8 %   Eosinophils Absolute 0.5 0.0 - 0.5 K/uL   Basophils Relative 1 %   Basophils Absolute 0.1 0.0 - 0.1 K/uL   Immature Granulocytes 0 %   Abs Immature Granulocytes 0.01 0.00 - 0.07 K/uL    Comment: Performed at [facility], [address] [place], [place] [address]  ANTIPHOSPHOLIPID SYNDROME PROF     Status: Abnormal   Collection Time: 08/08/20  9:58 AM  Result Value Ref Range   Anticardiolipin IgG <9 0 - 14 GPL U/mL    Comment: (NOTE)                          Negative:              <15                          Indeterminate:     15 - 20                          Low-Med Positive: >20 - 80                          High Positive:         >80    Anticardiolipin IgM <9 0 - 12 MPL U/mL    Comment: (NOTE)                          Negative:              <13                          Indeterminate:     13 - 20                          Low-Med Positive: >20 - 80                          High Positive:         >80    PTT Lupus Anticoagulant 31.6 0.0 - 51.9 sec   DRVVT 48.4 (H) 0.0 - 47.0 sec   Lupus Anticoag Interp Comment:     Comment: (NOTE) No lupus anticoagulant was detected. These results are consistent with specific inhibitors to one or more common pathway factors (X, V, II or fibrinogen). Performed At: [facility] [place], [place] [id number] Rush Farmer MD JF:[id number]   Fibrin derivatives D-[facility] only)     Status: Abnormal   Collection Time: 08/08/20  9:58 AM  Result Value Ref Range   Fibrin derivatives D-dimer ([facility]) 2,119.78 (H) 0.00 - 499.00 ng/mL (FEU)    Comment: (NOTE) <> Exclusion of Venous Thromboembolism (VTE) - OUTPATIENT ONLY   (Emergency Department or [place])    0-499 ng/ml (FEU): With a low to intermediate pretest probability                      for VTE this test result excludes the diagnosis  of VTE.   >499 ng/ml (FEU) : VTE not excluded; additional work up for VTE is                      required.  <> Testing on Inpatients and Evaluation of Disseminated Intravascular   Coagulation (DIC) Reference Range:   0-499 ng/ml (FEU) Performed at [facility], [address] [place], [place] [address]   Prothrombin gene mutation     Status: None   Collection Time: 08/08/20  9:58 AM  Result Value Ref Range   Recommendations-PTGENE: Comment     Comment: (NOTE) Result: c.*97G>A - Not Detected This result is not associated with an increased risk for venous thromboembolism. See Additional Clinical Information and Comments. Additional Clinical Information: Venous thromboembolism is a multifactorial disease influenced by genetic, environmental, and circumstantial risk factors. The c.*97G>A variant in the F2 gene is a genetic risk factor for venous thromboembolism. Heterozygous carriers have a 2- to 4-fold increased risk for venous thromboembolism. Homozygotes for the c.*97G>A variant are rare. The annual risk of VTE in homozygotes has been reported to be 1.1%/year. Individuals who carry both a c.*97G>A variant in the F2 gene and a c.1601G>A (p. Arg534Gln) variant in the F5 gene (commonly referred to as Factor V Leiden) have an approximately 20- fold increased risk for venous thromboembolism. Risks are likely to be even higher in more complex genotype combinations involving the F2 c.*97G>A variant and Factor V Leiden (PMID:  76734193). Additional risk factors include but are not limited to: deficiency of protein C, protein S, or antithrombin III, age, female sex, personal or family history of deep vein thromboembolism, smoking, surgery, prolonged immobilization, malignant neoplasm, tamoxifen treatment, raloxifene treatment, oral contraceptive use, hormone replacement therapy, and pregnancy. Management of thrombotic risk and thrombotic events should follow  established guidelines and fit the clinical circumstance. This result cannot predict the occurrence or recurrence of a thrombotic event. Comments: Genetic counseling is recommended to discuss the potential clinical implications of positive results, as well as recommendations for testing family members. Genetic Coordinators are available for health care providers to discuss results at [phone]). Test Details: Variant analyzed: c.*97G>A, previously referred to as G20210A Methods/Limitations: DNA analysis of the F2 gene (NM_000 506.5) was performed by PCR amplification followed by restriction enzyme analysis. The diagnostic sensitivity is >99%. Results must be combined with clinical information for the most accurate interpretation. Molecular-based testing is highly accurate, but as in any laboratory test, diagnostic errors may occur. False positive or false negative results may occur for reasons that include genetic variants, blood transfusions, bone marrow transplantation, somatic or tissue-specific mosaicism, mislabeled samples, or erroneous representation of family relationships. This test was developed and its performance characteristics determined by [facility]. It has not been cleared or approved by the Food and Drug Administration. References: [place] [facility], Laurann [place] [facility]; ACMG Professional Practice and Guidelines Committee. Addendum: [place] consensus statement on factor V Leiden mutation testing. Genet Med. 2021 Mar 5. doi: 40.9735/H299 [phone]-x. PMID: 24268341. Kristopher Oppenheim. Prothrombin Thrombophilia. 2006 Jul 25 [Updated 2021 Feb 4]. In: Tarri Glenn, Ardinger HH, Pagon RA, et al., editors. GeneReviews(R) [Internet]. [address] ([place]): [place], [place]; 1993-2021. Available from: [email or web address].cook-[email or web address]/ Terrilee Files, Carla Drape, Marin Shutter CS; ACMG  Laboratory Quality Assurance Committee. Venous thromboembolism laboratory testing (factor V Leiden and factor II c.*97G>A), 2018 update: a technical standard of the [place]  and Genomics Training and development officer). Genet Med. 2018 Dec;20(12):1489-1498. doi: 97.0263/Z85885-027-7412-I. Epub 2018 Oct 5. PMID: 78676720. Allison Quarry, PhD, [facility] Ruben Reason, PhD, [facility] Earlean Polka, PhD, [facility] Threasa Alpha, PhD, [facility] W Gailen Shelter, PhD, [facility] Alfredo Bach, PhD, [facility] Performed At: [facility] [address] [place], [place] [id number] Katina Degree MDPhD [id number]   Factor 5 leiden     Status: None   Collection Time: 08/08/20  9:58 AM  Result Value Ref Range   Recommendations-F5LEID: Comment     Comment: (NOTE) Result: c.1601G>A (p.Arg534Gln) - Not Detected This result is not associated with an increased risk for venous thromboembolism. See Additional Clinical Information and Comments. Additional Clinical Information: Venous thromboembolism is a multifactorial disease influenced by genetic, environmental, and circumstantial risk factors. The c.1601G>A (p. Arg534Gln) variant in the F5 gene, commonly referred to as Factor V Leiden, is a genetic risk factor for venous thromboembolism. Heterozygous carriers of this variant have a 6- to 8- fold increased risk for venous thromboembolism. Individuals homozygous for this variant (ie, with a copy of the variant on each chromosome) have an approximately 80-fold increased risk for venous thromboembolism. Individuals who carry both a c.*97G>A variant in the F2 gene and Factor V Leiden have an approximately 20-fold increased risk for venous thromboembolism. Risks are likely to be even higher in more complex genotype combinations in volving the F2 c.*97G>A variant and Factor V Leiden (PMID: 03546568). Additional risk factors include but are not limited to: deficiency of protein C, protein S, or antithrombin  III, age, female sex, personal or family history of deep vein thromboembolism, smoking, surgery, prolonged immobilization, malignant neoplasm, tamoxifen treatment, raloxifene treatment, oral contraceptive use, hormone replacement therapy, and pregnancy. Management of thrombotic risk and thrombotic events should follow established guidelines and fit the clinical circumstance. This result cannot predict the occurrence or recurrence of a thrombotic event. Comment: Genetic counseling is recommended to discuss the potential clinical implications of positive results, as well as recommendations for testing family members. Genetic Coordinators are available for health care providers to discuss results at [phone]). Test Details: Variant Analyzed: c.1601G>A (p. Arg534Gln), referred to as Fact or V Leiden Methods/Limitations: DNA analysis of the F5 gene (NM_000130.5) was performed by PCR amplification followed by restriction enzyme analysis. The diagnostic sensitivity is >99%. Results must be combined with clinical information for the most accurate interpretation. Molecular- based testing is highly accurate, but as in any laboratory test, diagnostic errors may occur. False positive or false negative results may occur for reasons that include genetic variants, blood transfusions, bone marrow transplantation, somatic or tissue-specific mosaicism, mislabeled samples, or erroneous representation of family relationships. This test was developed and its performance characteristics determined by [facility]. It has not been cleared or approved by the Food and Drug Administration. References: [place] [facility], [place] [place] [facility]; ACMG Professional Practice and Guidelines Committee. Addendum: [place] consensus statement on fac tor V Leiden mutation testing. Genet Med. 2021 Mar 5. doi: 17.0017/C94496-759- 01108-x. PMID: 16384665. Kristopher Oppenheim. Factor V  Leiden Thrombophilia. 1999 May 14 [Updated 2018 Jan 4]. In: Tarri Glenn, Ardinger HH, Pagon RA, et al., editors. GeneReviews(R) [Internet]. [address] ([place]): [place], [place]; 1993-2021. Available from: MortgageHole.tn Terrilee Files, Carla Drape, Marin Shutter CS; ACMG Laboratory Quality Assurance Committee. Venous thromboembolism laboratory testing (factor V Leiden and factor II c.*97G>A), 2018 update: a technical standard of the [place] and  Genomics Training and development officer). Genet Med. 2018 Dec;20(12):1489-1498. doi: 67.6195/K93267-124-5809-X. Epub 2018 Oct 5. PMID: 83382505. Allison Quarry, PhD, [facility] Ruben Reason, PhD, [facility] Earlean Polka, PhD, [facility] Threasa Alpha, PhD, [facility] W Gailen Shelter, PhD, [facility] Alfredo Bach, PhD, [facility] Performed At: [facility] [address] [place], [place] [id number] Katina Degree MDPhD [id number]   dRVVT Mix     Status: Abnormal   Collection Time: 08/08/20  9:58 AM  Result Value Ref Range   dRVVT Mix 41.4 (H) 0.0 - 40.4 sec    Comment: (NOTE) Performed At: [facility] [place], [place] [id number] Rush Farmer MD [id number]   dRVVT Confirm     Status: None   Collection Time: 08/08/20  9:58 AM  Result Value Ref Range   dRVVT Confirm 0.8 0.8 - 1.2 ratio    Comment: (NOTE) Performed At: [facility] [address] [place], [place] [id number] Rush Farmer MD [id number]   [place] / creatinine urine ratio     Status: None   Collection Time: 08/09/20  4:14 PM  Result Value Ref Range   Creatinine, Urine 66 20 - 275 mg/dL   Microalb, Ur 1.0 mg/dL    Comment: Reference Range Not established    Microalb Creat Ratio 15 <30 mcg/mg creat    Comment: . The ADA defines abnormalities in albumin excretion as follows: Marland Kitchen Albuminuria Category        Result (mcg/mg creatinine) . Normal to Mildly increased    <30 Moderately increased         30-299  Severely increased           > OR = 300 . The ADA recommends that at least two of three specimens collected within a 3-6 month period be abnormal before considering a patient to be within a diagnostic category.   Comprehensive metabolic panel     Status: Abnormal   Collection Time: 08/21/20  9:03 AM  Result Value Ref Range   Sodium 136 135 - 145 mmol/L   Potassium 4.5 3.5 - 5.1 mmol/L   Chloride 103 98 - 111 mmol/L   CO2 24 22 - 32 mmol/L   Glucose, Bld 150 (H) 70 - 99 mg/dL    Comment: Glucose reference range applies only to samples taken after fasting for at least 8 hours.   BUN 14 8 - 23 mg/dL   Creatinine, Ser 0.69 0.44 - 1.00 mg/dL   Calcium 10.8 (H) 8.9 - 10.3 mg/dL   Total Protein 8.0 6.5 - 8.1 g/dL   Albumin 3.6 3.5 - 5.0 g/dL   AST 20 15 - 41 U/L   ALT 13 0 - 44 U/L   Alkaline Phosphatase 123 38 - 126 U/L   Total Bilirubin 0.5 0.3 - 1.2 mg/dL   GFR, Estimated >60 >60 mL/min    Comment: (NOTE) Calculated using the CKD-EPI Creatinine Equation (2021)    Anion gap 9 5 - 15    Comment: Performed at [facility], [address] [place], [place] [address]  CBC with Differential     Status: None   Collection Time: 08/21/20  9:03 AM  Result Value Ref Range   WBC 6.8 4.0 - 10.5 K/uL   RBC 4.43 3.87 - 5.11 MIL/uL   Hemoglobin 12.2 12.0 - 15.0 g/dL   HCT 38.3 36.0 - 46.0 %   MCV 86.5 80.0 - 100.0 fL   MCH 27.5 26.0 - 34.0 pg   MCHC 31.9 30.0 - 36.0  g/dL   RDW 14.1 11.5 - 15.5 %   Platelets 279 150 - 400 K/uL   nRBC 0.0 0.0 - 0.2 %   Neutrophils Relative % 57 %   Neutro Abs 3.8 1.7 - 7.7 K/uL   Lymphocytes Relative 26 %   Lymphs Abs 1.8 0.7 - 4.0 K/uL   Monocytes Relative 10 %   Monocytes Absolute 0.7 0.1 - 1.0 K/uL   Eosinophils Relative 6 %   Eosinophils Absolute 0.4 0.0 - 0.5 K/uL   Basophils Relative 1 %   Basophils Absolute 0.1 0.0 - 0.1 K/uL   Immature Granulocytes 0 %   Abs Immature Granulocytes 0.02 0.00 -  0.07 K/uL    Comment: Performed at [facility], [address] [place], [place] [address]  Lipase     Status: None   Collection Time: 09/25/20  9:42 AM  Result Value Ref Range   Lipase 36 14 - 85 U/L  Giardia, EIA; Ova/Parasite     Status: None   Collection Time: 09/26/20  1:46 PM   Specimen: Stool   ST  Result Value Ref Range   Ova + Parasite Exam Final report     Comment: These results were obtained using wet preparation(s) and trichrome stained smear. This test does not include testing for Cryptosporidium parvum, Cyclospora, or Microsporidia.    Result 1 Comment     Comment: No ova, cysts, or parasites seen. One negative specimen does not rule out the possibility of a parasitic infection.    Giardia Ag, Stl Negative Negative     PHQ2/9: Depression screen [facility] 2/9 10/01/2020 09/19/2020 01/05/2020 12/07/2019 11/07/2019  Decreased Interest 3 0 0 0 0  Down, Depressed, Hopeless 3 0 0 0 0  PHQ - 2 Score 6 0 0 0 0  Altered sleeping 3 - 0 0 0  Tired, decreased energy 3 - 0 0 0  Change in appetite 3 - 1 0 0  Feeling bad or failure about yourself  0 - 0 0 0  Trouble concentrating 3 - 0 0 0  Moving slowly or fidgety/restless 0 - 0 0 0  Suicidal thoughts 0 - 0 0 0  PHQ-9 Score 18 - 1 0 0  Difficult doing work/chores - - Not difficult at all Not difficult at all Not difficult at all  Some recent data might be hidden    phq 9 is positive   Fall Risk: Fall Risk  10/01/2020 09/19/2020 01/05/2020 12/07/2019 11/07/2019  Falls in the past year? 1 0 0 0 0  Number falls in past yr: 1 0 0 0 0  Comment 3 - - - -  Injury with Fall? 1 0 - 0 0  Comment Busted lip - - - -  Risk for fall due to : - No Fall Risks - - -  Follow up - Falls prevention discussed - Falls evaluation completed -    Assessment & Plan  1. Disorientation  - CULTURE, URINE COMPREHENSIVE - CT Head Wo Contrast; Future - AMB Referral to [facility]  2. Anemia, unspecified type  - CBC with  Differential/Platelet - Iron, TIBC and Ferritin Panel  3. History of recent fall  - CT Head Wo Contrast; Future  4. Unsteady gait  - CT Head Wo Contrast; Future  5. Carcinoma of overlapping sites of left breast in female, estrogen receptor positive ([place])  - AMB Referral to [facility]  6. Hypokalemia  - Potassium - Magnesium  7. Portal vein  thrombosis  Stop Xarelto and start Lovenox per Dr. Tish Men  - enoxaparin (LOVENOX) 40 MG/0.4ML injection; Inject 0.4 mLs (40 mg total) into the skin daily.  Dispense: 24 mL; Refill: 0 - AMB Referral to [facility]

## 2020-10-01 ENCOUNTER — Encounter: Payer: Self-pay | Admitting: Family Medicine

## 2020-10-01 ENCOUNTER — Ambulatory Visit
Admission: RE | Admit: 2020-10-01 | Discharge: 2020-10-01 | Disposition: A | Discharge status: Home / Self Care | Payer: Medicare PPO | Source: Ambulatory Visit | Attending: Family Medicine | Admitting: Family Medicine

## 2020-10-01 ENCOUNTER — Ambulatory Visit (INDEPENDENT_AMBULATORY_CARE_PROVIDER_SITE_OTHER): Payer: Medicare PPO | Admitting: Family Medicine

## 2020-10-01 ENCOUNTER — Other Ambulatory Visit: Payer: Self-pay

## 2020-10-01 VITALS — BP 136/86 | HR 98 | Temp 98.1°F | Resp 16 | Ht 64.0 in | Wt 167.0 lb

## 2020-10-01 DIAGNOSIS — D649 Anemia, unspecified: Secondary | ICD-10-CM

## 2020-10-01 DIAGNOSIS — C50812 Malignant neoplasm of overlapping sites of left female breast: Secondary | ICD-10-CM | POA: Diagnosis not present

## 2020-10-01 DIAGNOSIS — R410 Disorientation, unspecified: Secondary | ICD-10-CM | POA: Insufficient documentation

## 2020-10-01 DIAGNOSIS — R4182 Altered mental status, unspecified: Secondary | ICD-10-CM | POA: Diagnosis not present

## 2020-10-01 DIAGNOSIS — R2681 Unsteadiness on feet: Secondary | ICD-10-CM | POA: Insufficient documentation

## 2020-10-01 DIAGNOSIS — Z9181 History of falling: Secondary | ICD-10-CM | POA: Diagnosis not present

## 2020-10-01 DIAGNOSIS — Z170 Estrogen receptor positive status [ER+]: Secondary | ICD-10-CM | POA: Diagnosis not present

## 2020-10-01 DIAGNOSIS — I81 Portal vein thrombosis: Secondary | ICD-10-CM

## 2020-10-01 DIAGNOSIS — E876 Hypokalemia: Secondary | ICD-10-CM | POA: Diagnosis not present

## 2020-10-01 MED ORDER — ENOXAPARIN SODIUM 40 MG/0.4ML ~~LOC~~ SOLN: 40.0000 mg | SUBCUTANEOUS | 0 refills | Status: DC

## 2020-10-01 NOTE — Addendum Note (Signed)
Addended by: Carlene Coria on: 10/01/2020 03:21 PM   Modules accepted: Orders

## 2020-10-02 ENCOUNTER — Telehealth: Payer: Self-pay | Admitting: *Deleted

## 2020-10-02 DIAGNOSIS — R1013 Epigastric pain: Secondary | ICD-10-CM | POA: Diagnosis not present

## 2020-10-02 DIAGNOSIS — R410 Disorientation, unspecified: Secondary | ICD-10-CM | POA: Diagnosis not present

## 2020-10-02 DIAGNOSIS — R7989 Other specified abnormal findings of blood chemistry: Secondary | ICD-10-CM | POA: Diagnosis not present

## 2020-10-02 DIAGNOSIS — Z5329 Procedure and treatment not carried out because of patient's decision for other reasons: Secondary | ICD-10-CM | POA: Diagnosis not present

## 2020-10-02 DIAGNOSIS — C7889 Secondary malignant neoplasm of other digestive organs: Secondary | ICD-10-CM | POA: Diagnosis not present

## 2020-10-02 DIAGNOSIS — R918 Other nonspecific abnormal finding of lung field: Secondary | ICD-10-CM | POA: Diagnosis not present

## 2020-10-02 DIAGNOSIS — S0990XA Unspecified injury of head, initial encounter: Secondary | ICD-10-CM | POA: Diagnosis not present

## 2020-10-02 DIAGNOSIS — R9431 Abnormal electrocardiogram [ECG] [EKG]: Secondary | ICD-10-CM | POA: Diagnosis not present

## 2020-10-02 DIAGNOSIS — I81 Portal vein thrombosis: Secondary | ICD-10-CM | POA: Diagnosis not present

## 2020-10-02 DIAGNOSIS — Z20822 Contact with and (suspected) exposure to covid-19: Secondary | ICD-10-CM | POA: Diagnosis not present

## 2020-10-02 DIAGNOSIS — J90 Pleural effusion, not elsewhere classified: Secondary | ICD-10-CM | POA: Diagnosis not present

## 2020-10-02 DIAGNOSIS — C786 Secondary malignant neoplasm of retroperitoneum and peritoneum: Secondary | ICD-10-CM | POA: Diagnosis not present

## 2020-10-02 DIAGNOSIS — S199XXA Unspecified injury of neck, initial encounter: Secondary | ICD-10-CM | POA: Diagnosis not present

## 2020-10-02 DIAGNOSIS — R109 Unspecified abdominal pain: Secondary | ICD-10-CM | POA: Diagnosis not present

## 2020-10-02 DIAGNOSIS — W19XXXA Unspecified fall, initial encounter: Secondary | ICD-10-CM | POA: Diagnosis not present

## 2020-10-02 LAB — CBC WITH DIFFERENTIAL/PLATELET
Absolute Monocytes: 1198 cells/uL — ABNORMAL HIGH (ref 200–950)
Basophils Absolute: 61 cells/uL (ref 0–200)
Basophils Relative: 0.5 %
Eosinophils Absolute: 339 cells/uL (ref 15–500)
Eosinophils Relative: 2.8 %
HCT: 30.8 % — ABNORMAL LOW (ref 35.0–45.0)
Hemoglobin: 9.8 g/dL — ABNORMAL LOW (ref 11.7–15.5)
Lymphs Abs: 1150 cells/uL (ref 850–3900)
MCH: 26.8 pg — ABNORMAL LOW (ref 27.0–33.0)
MCHC: 31.8 g/dL — ABNORMAL LOW (ref 32.0–36.0)
MCV: 84.4 fL (ref 80.0–100.0)
MPV: 12.3 fL (ref 7.5–12.5)
Monocytes Relative: 9.9 %
Neutro Abs: 9353 cells/uL — ABNORMAL HIGH (ref 1500–7800)
Neutrophils Relative %: 77.3 %
Platelets: 249 10*3/uL (ref 140–400)
RBC: 3.65 10*6/uL — ABNORMAL LOW (ref 3.80–5.10)
RDW: 15.1 % — ABNORMAL HIGH (ref 11.0–15.0)
Total Lymphocyte: 9.5 %
WBC: 12.1 10*3/uL — ABNORMAL HIGH (ref 3.8–10.8)

## 2020-10-02 LAB — IRON,TIBC AND FERRITIN PANEL
%SAT: 8 % (calc) — ABNORMAL LOW (ref 16–45)
Ferritin: 76 ng/mL (ref 16–288)
Iron: 20 ug/dL — ABNORMAL LOW (ref 45–160)
TIBC: 251 mcg/dL (calc) (ref 250–450)

## 2020-10-02 LAB — MAGNESIUM: Magnesium: 1.9 mg/dL (ref 1.5–2.5)

## 2020-10-02 LAB — POTASSIUM: Potassium: 4.3 mmol/L (ref 3.5–5.3)

## 2020-10-02 NOTE — Chronic Care Management (AMB) (Signed)
  Chronic Care Management   Note  10/02/2020 Name: Theresa Andrews MRN: [id number] DOB: 09/27/1945  [name] is a 75 y.o. year old female who is a primary care patient of Steele Sizer, MD. I reached out to [name] by phone today in response to a referral sent by Ms. Vergia Alberts Icard's PCP, Dr. Ancil Boozer.     Ms. Thang was given information about Chronic Care Management services today including:  1. CCM service includes personalized support from designated clinical staff supervised by her physician, including individualized plan of care and coordination with other care providers 2. 24/7 contact phone numbers for assistance for urgent and routine care needs. 3. Service will only be billed when office clinical staff spend 20 minutes or more in a month to coordinate care. 4. Only one practitioner may furnish and bill the service in a calendar month. 5. The patient may stop CCM services at any time (effective at the end of the month) by phone call to the office staff. 6. The patient will be responsible for cost sharing (co-pay) of up to 20% of the service fee (after annual deductible is met).  Patient agreed to services and verbal consent obtained.   Follow up plan: Telephone appointment with care management team member scheduled for:  Licensed Clinical Social Worker 10/04/2020 RNCM 10/09/2020 PharmD 10/16/2020  [facility]  Direct Dial: [phone]

## 2020-10-02 NOTE — Telephone Encounter (Signed)
Patient's daughter asking how much urine for the urine sample is needed. Advised minimum 1/4 of the specimen container. Stated she would drop off this afternoon.

## 2020-10-03 ENCOUNTER — Telehealth: Payer: Self-pay | Admitting: Family Medicine

## 2020-10-03 ENCOUNTER — Encounter: Payer: Self-pay | Admitting: Medical Oncology

## 2020-10-03 ENCOUNTER — Encounter: Payer: Self-pay | Admitting: Gastroenterology

## 2020-10-03 ENCOUNTER — Observation Stay
Admission: EM | Admit: 2020-10-03 | Discharge: 2020-10-04 | Disposition: A | Discharge status: Home / Self Care | Payer: Medicare PPO | Attending: Internal Medicine | Admitting: Internal Medicine

## 2020-10-03 ENCOUNTER — Other Ambulatory Visit: Payer: Medicare PPO

## 2020-10-03 ENCOUNTER — Emergency Department: Payer: Medicare PPO

## 2020-10-03 ENCOUNTER — Other Ambulatory Visit: Payer: Self-pay

## 2020-10-03 DIAGNOSIS — R531 Weakness: Secondary | ICD-10-CM

## 2020-10-03 DIAGNOSIS — Z7901 Long term (current) use of anticoagulants: Secondary | ICD-10-CM | POA: Insufficient documentation

## 2020-10-03 DIAGNOSIS — C50311 Malignant neoplasm of lower-inner quadrant of right female breast: Secondary | ICD-10-CM | POA: Diagnosis not present

## 2020-10-03 DIAGNOSIS — R109 Unspecified abdominal pain: Secondary | ICD-10-CM | POA: Diagnosis present

## 2020-10-03 DIAGNOSIS — I81 Portal vein thrombosis: Secondary | ICD-10-CM | POA: Insufficient documentation

## 2020-10-03 DIAGNOSIS — Z6839 Body mass index (BMI) 39.0-39.9, adult: Secondary | ICD-10-CM | POA: Insufficient documentation

## 2020-10-03 DIAGNOSIS — Z170 Estrogen receptor positive status [ER+]: Secondary | ICD-10-CM | POA: Diagnosis not present

## 2020-10-03 DIAGNOSIS — D72829 Elevated white blood cell count, unspecified: Secondary | ICD-10-CM | POA: Diagnosis not present

## 2020-10-03 DIAGNOSIS — Z79899 Other long term (current) drug therapy: Secondary | ICD-10-CM | POA: Diagnosis not present

## 2020-10-03 DIAGNOSIS — Z20822 Contact with and (suspected) exposure to covid-19: Secondary | ICD-10-CM | POA: Insufficient documentation

## 2020-10-03 DIAGNOSIS — F411 Generalized anxiety disorder: Secondary | ICD-10-CM | POA: Diagnosis not present

## 2020-10-03 DIAGNOSIS — E669 Obesity, unspecified: Secondary | ICD-10-CM | POA: Diagnosis present

## 2020-10-03 DIAGNOSIS — R4182 Altered mental status, unspecified: Secondary | ICD-10-CM | POA: Diagnosis not present

## 2020-10-03 DIAGNOSIS — Z87891 Personal history of nicotine dependence: Secondary | ICD-10-CM | POA: Insufficient documentation

## 2020-10-03 DIAGNOSIS — K769 Liver disease, unspecified: Secondary | ICD-10-CM | POA: Diagnosis not present

## 2020-10-03 DIAGNOSIS — Z853 Personal history of malignant neoplasm of breast: Secondary | ICD-10-CM | POA: Diagnosis not present

## 2020-10-03 DIAGNOSIS — E782 Mixed hyperlipidemia: Secondary | ICD-10-CM | POA: Diagnosis not present

## 2020-10-03 DIAGNOSIS — G4489 Other headache syndrome: Secondary | ICD-10-CM | POA: Diagnosis not present

## 2020-10-03 DIAGNOSIS — E785 Hyperlipidemia, unspecified: Secondary | ICD-10-CM | POA: Insufficient documentation

## 2020-10-03 DIAGNOSIS — C50812 Malignant neoplasm of overlapping sites of left female breast: Principal | ICD-10-CM | POA: Insufficient documentation

## 2020-10-03 DIAGNOSIS — R1906 Epigastric swelling, mass or lump: Secondary | ICD-10-CM

## 2020-10-03 DIAGNOSIS — Z9181 History of falling: Secondary | ICD-10-CM

## 2020-10-03 DIAGNOSIS — C787 Secondary malignant neoplasm of liver and intrahepatic bile duct: Secondary | ICD-10-CM | POA: Diagnosis not present

## 2020-10-03 DIAGNOSIS — F419 Anxiety disorder, unspecified: Secondary | ICD-10-CM | POA: Diagnosis not present

## 2020-10-03 DIAGNOSIS — C7889 Secondary malignant neoplasm of other digestive organs: Secondary | ICD-10-CM | POA: Insufficient documentation

## 2020-10-03 DIAGNOSIS — R410 Disorientation, unspecified: Secondary | ICD-10-CM

## 2020-10-03 DIAGNOSIS — R1084 Generalized abdominal pain: Secondary | ICD-10-CM | POA: Diagnosis not present

## 2020-10-03 DIAGNOSIS — I10 Essential (primary) hypertension: Secondary | ICD-10-CM | POA: Insufficient documentation

## 2020-10-03 DIAGNOSIS — G720 Drug-induced myopathy: Secondary | ICD-10-CM | POA: Diagnosis present

## 2020-10-03 DIAGNOSIS — C786 Secondary malignant neoplasm of retroperitoneum and peritoneum: Secondary | ICD-10-CM | POA: Diagnosis not present

## 2020-10-03 DIAGNOSIS — C50919 Malignant neoplasm of unspecified site of unspecified female breast: Secondary | ICD-10-CM | POA: Diagnosis present

## 2020-10-03 DIAGNOSIS — K7689 Other specified diseases of liver: Secondary | ICD-10-CM | POA: Diagnosis not present

## 2020-10-03 DIAGNOSIS — R1011 Right upper quadrant pain: Secondary | ICD-10-CM

## 2020-10-03 DIAGNOSIS — T466X5A Adverse effect of antihyperlipidemic and antiarteriosclerotic drugs, initial encounter: Secondary | ICD-10-CM | POA: Diagnosis present

## 2020-10-03 DIAGNOSIS — R188 Other ascites: Secondary | ICD-10-CM | POA: Diagnosis not present

## 2020-10-03 DIAGNOSIS — R519 Headache, unspecified: Secondary | ICD-10-CM | POA: Diagnosis not present

## 2020-10-03 LAB — COMPREHENSIVE METABOLIC PANEL
ALT: 9 U/L (ref 0–44)
AST: 22 U/L (ref 15–41)
Albumin: 2.9 g/dL — ABNORMAL LOW (ref 3.5–5.0)
Alkaline Phosphatase: 98 U/L (ref 38–126)
Anion gap: 10 (ref 5–15)
BUN: 19 mg/dL (ref 8–23)
CO2: 22 mmol/L (ref 22–32)
Calcium: 9.4 mg/dL (ref 8.9–10.3)
Chloride: 107 mmol/L (ref 98–111)
Creatinine, Ser: 0.71 mg/dL (ref 0.44–1.00)
GFR, Estimated: >60 mL/min (ref >60)
Glucose, Bld: 120 mg/dL — ABNORMAL HIGH (ref 70–99)
Potassium: 4.1 mmol/L (ref 3.5–5.1)
Sodium: 139 mmol/L (ref 135–145)
Total Bilirubin: 1 mg/dL (ref 0.3–1.2)
Total Protein: 6.8 g/dL (ref 6.5–8.1)

## 2020-10-03 LAB — CBC
HCT: 31.5 % — ABNORMAL LOW (ref 36.0–46.0)
Hemoglobin: 10 g/dL — ABNORMAL LOW (ref 12.0–15.0)
MCH: 26.5 pg (ref 26.0–34.0)
MCHC: 31.7 g/dL (ref 30.0–36.0)
MCV: 83.6 fL (ref 80.0–100.0)
Platelets: 225 10*3/uL (ref 150–400)
RBC: 3.77 MIL/uL — ABNORMAL LOW (ref 3.87–5.11)
RDW: 17.3 % — ABNORMAL HIGH (ref 11.5–15.5)
WBC: 11.7 10*3/uL — ABNORMAL HIGH (ref 4.0–10.5)
nRBC: 0 % (ref 0.0–0.2)

## 2020-10-03 LAB — URINALYSIS, COMPLETE (UACMP) WITH MICROSCOPIC
Bacteria, UA: NONE SEEN
Bilirubin Urine: NEGATIVE
Glucose, UA: NEGATIVE mg/dL
Hgb urine dipstick: NEGATIVE
Ketones, ur: 5 mg/dL — AB
Leukocytes,Ua: NEGATIVE
Nitrite: NEGATIVE
Protein, ur: 30 mg/dL — AB
Specific Gravity, Urine: >1.046 — ABNORMAL HIGH (ref 1.005–1.030)
pH: 5 (ref 5.0–8.0)

## 2020-10-03 LAB — PROTIME-INR
INR: 1.4 — ABNORMAL HIGH (ref 0.8–1.2)
Prothrombin Time: 16.6 seconds — ABNORMAL HIGH (ref 11.4–15.2)

## 2020-10-03 LAB — RESP PANEL BY RT-PCR (FLU A&B, COVID) ARPGX2
Influenza A by PCR: NEGATIVE
Influenza B by PCR: NEGATIVE
SARS Coronavirus 2 by RT PCR: NEGATIVE

## 2020-10-03 LAB — LIPASE, BLOOD: Lipase: 39 U/L (ref 11–51)

## 2020-10-03 LAB — APTT: aPTT: 34 seconds (ref 24–36)

## 2020-10-03 LAB — AMMONIA: Ammonia: 9 umol/L (ref 9–35)

## 2020-10-03 LAB — HEPARIN LEVEL (UNFRACTIONATED): Heparin Unfractionated: 1.39 IU/mL — ABNORMAL HIGH (ref 0.30–0.70)

## 2020-10-03 MED ORDER — METOPROLOL SUCCINATE ER 25 MG PO TB24
12.5000 mg | ORAL_TABLET | Freq: Every day | ORAL | Status: DC
  Administered 2020-10-03 – 2020-10-04 (×2): 12.5 mg via ORAL
  Filled 2020-10-03: qty 0.5
  Filled 2020-10-03: qty 1

## 2020-10-03 MED ORDER — HEPARIN (PORCINE) 25000 UT/250ML-% IV SOLN: 1200.0000 [IU]/h | INTRAVENOUS | Status: AC

## 2020-10-03 MED ORDER — ACETAMINOPHEN 650 MG RE SUPP: 325.0000 mg | Freq: Four times a day (QID) | RECTAL | Status: DC | PRN

## 2020-10-03 MED ORDER — SODIUM CHLORIDE 0.9 % IV BOLUS
500.0000 mL | Freq: Once | INTRAVENOUS | Status: AC
  Administered 2020-10-03: 500 mL via INTRAVENOUS

## 2020-10-03 MED ORDER — VITAMIN D 25 MCG (1000 UNIT) PO TABS
1000.0000 [IU] | ORAL_TABLET | Freq: Every day | ORAL | Status: DC
  Administered 2020-10-03: 1000 [IU] via ORAL
  Filled 2020-10-03: qty 1

## 2020-10-03 MED ORDER — POLYETHYLENE GLYCOL 3350 17 G PO PACK
17.0000 g | PACK | Freq: Two times a day (BID) | ORAL | Status: DC
  Administered 2020-10-04: 17 g via ORAL
  Filled 2020-10-03: qty 1

## 2020-10-03 MED ORDER — ACETAMINOPHEN 325 MG PO TABS: 325.0000 mg | ORAL_TABLET | Freq: Four times a day (QID) | ORAL | Status: DC | PRN

## 2020-10-03 MED ORDER — MORPHINE SULFATE (PF) 2 MG/ML IV SOLN: 0.5000 mg | INTRAVENOUS | Status: DC | PRN

## 2020-10-03 MED ORDER — ONDANSETRON HCL 4 MG PO TABS: 4.0000 mg | ORAL_TABLET | Freq: Four times a day (QID) | ORAL | Status: DC | PRN

## 2020-10-03 MED ORDER — AMLODIPINE BESYLATE 10 MG PO TABS
10.0000 mg | ORAL_TABLET | Freq: Every day | ORAL | Status: DC
Start: 2020-10-03 — End: 2020-10-05
  Administered 2020-10-03 – 2020-10-04 (×2): 10 mg via ORAL
  Filled 2020-10-03: qty 2
  Filled 2020-10-03: qty 1

## 2020-10-03 MED ORDER — IRBESARTAN 150 MG PO TABS
150.0000 mg | ORAL_TABLET | Freq: Every day | ORAL | Status: DC
  Administered 2020-10-03 – 2020-10-04 (×2): 150 mg via ORAL
  Filled 2020-10-03 (×2): qty 1

## 2020-10-03 MED ORDER — SODIUM CHLORIDE 0.9 % IV SOLN: Freq: Once | INTRAVENOUS | Status: AC

## 2020-10-03 MED ORDER — HEPARIN BOLUS VIA INFUSION
4000.0000 [IU] | Freq: Once | INTRAVENOUS | Status: AC
  Administered 2020-10-03: 4000 [IU] via INTRAVENOUS
  Filled 2020-10-03: qty 4000

## 2020-10-03 MED ORDER — ONDANSETRON HCL 4 MG/2ML IJ SOLN: 4.0000 mg | Freq: Four times a day (QID) | INTRAMUSCULAR | Status: DC | PRN

## 2020-10-03 MED ORDER — IOHEXOL 300 MG/ML  SOLN
100.0000 mL | Freq: Once | INTRAMUSCULAR | Status: AC | PRN
  Administered 2020-10-03: 100 mL via INTRAVENOUS

## 2020-10-03 MED ORDER — PAROXETINE HCL 10 MG PO TABS
10.0000 mg | ORAL_TABLET | Freq: Every day | ORAL | Status: DC
  Administered 2020-10-03 – 2020-10-04 (×2): 10 mg via ORAL
  Filled 2020-10-03 (×2): qty 1

## 2020-10-03 MED ORDER — HEPARIN BOLUS VIA INFUSION
4500.0000 [IU] | Freq: Once | INTRAVENOUS | Status: DC
  Filled 2020-10-03: qty 4500

## 2020-10-03 MED ORDER — POLYETHYLENE GLYCOL 3350 17 G PO PACK: 17.0000 g | PACK | Freq: Two times a day (BID) | ORAL | Status: DC

## 2020-10-03 MED ORDER — HEPARIN (PORCINE) 25000 UT/250ML-% IV SOLN
1200.0000 [IU]/h | INTRAVENOUS | Status: DC
  Administered 2020-10-03: 1200 [IU]/h via INTRAVENOUS
  Filled 2020-10-03: qty 250

## 2020-10-03 MED ORDER — ROSUVASTATIN CALCIUM 10 MG PO TABS
10.0000 mg | ORAL_TABLET | Freq: Every day | ORAL | Status: DC
Start: 2020-10-03 — End: 2020-10-05
  Administered 2020-10-03: 10 mg via ORAL
  Filled 2020-10-03: qty 1

## 2020-10-03 NOTE — Telephone Encounter (Signed)
Pt daughter notified, She states Called EMS and took her to ER at [facility]

## 2020-10-03 NOTE — Plan of Care (Signed)

## 2020-10-03 NOTE — H&P (Signed)
History and Physical   Theresa Andrews Theresa Andrews DOB: 08-23-1945 DOA: 10/03/2020  PCP: Steele Sizer, MD  Outpatient Specialists: Dr. Rogue Bussing, medical oncology Patient coming from: Home  I have personally briefly reviewed patient's old medical records in [place].  Chief Concern: Abdominal pain  HPI: Theresa Andrews is a 75 y.o. female with medical history significant for left sided breast cancer ten years ago s/p lumpectomy, radiaion and chemo, obesity, hyperparathyroidism, hypercalcemia, anxiety, hyperlipidemia, hypertension, presented to the emergency department for chief concerns of headache.  She reports the headache is a throbbing sensation along the right forehead, different from any headache she is ever experienced before, and it started in the a.m.  In the emergency department, she reports that her headache has resolved.  Her main concern at this time, is a right upper quadrant nagging pain, 8/10 and persistent.  She reports the pain is worse with laying down and improves with sitting up. The nagging pain started about two weeks ago.  She reports tylenol improves the pain.   She endorses compliance with medications.  Family brought her to the emergency department because of persistent acute confusion in the last 2 weeks.  Previously patient was able to perform her ADLs however in the last 10 days to 2 weeks, patient was not able to dress herself unable to open the screen door and endorses urinary incontinence.  Furthermore she also endorses lack of appetite.  She endorses unintentional 10 pound weight lost in 6 months.  Social history: She denies tobacco, recreational drug use, EtOH  Vaccination: COVID vaccine via [employer], 09/20/2019, 10/11/2019, 06/24/2020  ROS: Constitutional: + weight change, no fever ENT/Mouth: no sore throat, no rhinorrhea Eyes: no eye pain, no vision changes Cardiovascular: no chest pain, no dyspnea,  no edema, no palpitations Respiratory: no  cough, no sputum, no wheezing Gastrointestinal: no nausea, no vomiting, no diarrhea, no constipation. +Abdominal pain Genitourinary: no urinary incontinence, no dysuria, no hematuria Musculoskeletal: no arthralgias, no myalgias Skin: no skin lesions, no pruritus, Neuro: + weakness, no loss of consciousness, no syncope Psych: no anxiety, no depression, + decrease appetite Heme/Lymph: no bruising, no bleeding  ED Course: Discussed with ED provider, patient requiring hospitalization due to abdominal pain, nausea, vomiting  Vitals in the ED was afebrile with temperature of 97.8, respiration 22, heart rate 86, blood pressure 136/81, satting at 99% on room air.  CT of the abdomen and pelvis with contrast ordered in the ED read as new or progressive metastatic disease, most definite involving necrotic abdominal nodes and peritoneum.  Developing periumbilical nodularity also likely metastatic.  Progressive liver disease lesions likely indicative of metastatic disease. Developing soft tissue fullness of the pancreatic tail, with considerations of primary adenocarcinoma, metastatic disease, or superimposed acute pancreatitis.  Left breast mass in the setting of prior lumpectomy for carcinoma.  Suspicious of new breast primary.  News small volume abdominal pelvic ascites.  Assessment/Plan  Principal Problem:   Abdominal pain Active Problems:   Anxiety disorder   Breast CA (HCC)   Hyperlipidemia   At risk for falling   Carcinoma of overlapping sites of left breast in female, estrogen receptor positive (HCC)   Benign essential HTN   Obesity (BMI 30-39.9)   Statin myopathy   Lesion of liver   Abdominal pain-suspect secondary to multiple liver lesions Unintentional weight loss -Oncology, Dr. Rogue Bussing has been consulted -Pain medication morphine IV 0.5 mg every 2 hours for moderate to severe pain, 2 days -Cancer tension 15-3, 27.29, CEA,  have been ordered by oncologist -Ultrasound-guided core  biopsy of lymph nodes ordered for 09/24/2020 -N.p.o. at midnight  History of portal vein thrombosis -Previously on home enoxaparin, which has been held pending ultrasound-guided lymph node biopsy -Heparin GTT ordered -Communicated with pharmacy via pharmacy consultation for heparin GTT to be stopped about 6 hours prior to biopsy   Prolonged QT-new diagnosis -Avoid QT prolonging agents  Hypertension-amlodipine 10 mg daily resumed, irbesartan 150 mg resumed, metoprolol succinate 12.5 mg daily resumed  New small volume abdominopelvic ascites-outpatient follow-up, possible paracentesis if indicated after ultrasound-guided biopsy of the lymph node  Hyperlipidemia-rosuvastatin 10 mg daily resumed  Depression/anxiety-paroxetine 10 mg daily resumed  Constipation-MiraLAX ordered  History of left breast cancer status post lumpectomy, radiation, chemotherapy Chart reviewed.   DVT prophylaxis: Heparin GTT Code Status: Full code Diet: Heart healthy now, n.p.o. at midnight Family Communication: Updated daughter at bedside Disposition Plan: Pending clinical course Consults called: Oncology Admission status: Observation, MedSurg no telemetry  Past Medical History:  Diagnosis Date  . Anxiety   . Breast cancer ([place])    pT2 pN0 cM0 (stage IIA) invasive mammary carcinoma of left outer breast status post lumpectomy and sentinel node study on July 14, 2010  . CAD (coronary artery disease)   . Depression   . H/O hypokalemia   . History of chemotherapy   . History of MI (myocardial infarction)   . Hyperglycemia   . Hyperlipidemia   . Hypertension   . Peripheral neuropathy due to chemotherapy ([place])   . Personal history of chemotherapy 2011   left breast ca  . Personal history of radiation therapy 2001   left breast ca   Past Surgical History:  Procedure Laterality Date  . ABDOMINAL HYSTERECTOMY    . BREAST BIOPSY Left 2008   neg  . BREAST BIOPSY Left 03/05/2010   invasive mammary  carcinoma  . BREAST LUMPECTOMY Left 07/14/2010   INVASIVE MAMMARY CARCINOMA WITH PROMINENT INFILTRATIVE PATTERN, negative margins  . CHOLECYSTECTOMY    . COLONOSCOPY WITH PROPOFOL N/A 11/22/2015   Procedure: COLONOSCOPY WITH PROPOFOL;  Surgeon: Hulen Luster, MD;  [facility] ENDOSCOPY;  Service: Gastroenterology;  Laterality: N/A;  . CORONARY ANGIOPLASTY WITH STENT PLACEMENT  2008   Social History:  reports that she quit smoking about 14 years ago. Her smoking use included cigarettes. She has a 6.25 pack-year smoking history. She has never used smokeless tobacco. She reports that she does not drink alcohol and does not use drugs.  Allergies  Allergen Reactions  . Tetanus Toxoids Shortness Of Breath and Rash  . Shellfish Allergy    Family History  Problem Relation Age of Onset  . Breast cancer Sister 25  . Hypertension Mother   . Aneurysm Mother   . Heart attack Sister   . Birth defects Sister    Family history: Family history reviewed and not pertinent  Prior to Admission medications   Medication Sig Start Date End Date Taking? Authorizing Provider  amLODipine (NORVASC) 10 MG tablet Take 1 tablet (10 mg total) by mouth daily. 07/29/20   Steele Sizer, MD  aspirin 81 MG EC tablet Take 81 mg by mouth daily.    Roselee Nova, MD  cholecalciferol (VITAMIN D) 1000 units tablet Take 1,000 Units by mouth daily.    [provider]  enoxaparin (LOVENOX) 40 MG/0.4ML injection Inject 0.4 mLs (40 mg total) into the skin daily. 10/01/20   Steele Sizer, MD  metoprolol succinate (TOPROL-XL) 25 MG 24 hr tablet Take  0.5 tablets (12.5 mg total) by mouth daily. 07/29/20   Steele Sizer, MD  PARoxetine (PAXIL) 10 MG tablet Take 1 tablet (10 mg total) by mouth daily. 09/13/20   Steele Sizer, MD  potassium chloride SA (KLOR-CON) 20 MEQ tablet 1 pill twice a day 08/08/20   Cammie Sickle, MD  rosuvastatin (CRESTOR) 10 MG tablet Take 1 tablet (10 mg total) by mouth daily. 09/04/20    Steele Sizer, MD  telmisartan (MICARDIS) 80 MG tablet One daily 07/29/20   Steele Sizer, MD  tretinoin (RETIN-A) 0.025 % cream Apply topically at bedtime.  10/23/17   [provider]   Physical Exam: Vitals:   10/03/20 1500 10/03/20 1742 10/03/20 1810 10/03/20 2007  BP: (!) 152/80 136/79 133/82 124/64  Pulse: 91 92 97 84  Resp:  (!) 26 14 18   Temp:  98.1 F (36.7 C) 97.8 F (36.6 C) 97.8 F (36.6 C)  TempSrc:  Oral Oral Oral  SpO2: 99% 100% 100% 99%  Weight:      Height:       Constitutional: appears age-appropriate, appears frail NAD, calm, comfortable Eyes: PERRL, lids and conjunctivae normal ENMT: Mucous membranes are moist. Posterior pharynx clear of any exudate or lesions. Age-appropriate dentition. Hearing appropriate Neck: normal, supple, no masses, no thyromegaly Respiratory: clear to auscultation bilaterally, no wheezing, no crackles. Normal respiratory effort. No accessory muscle use.  Cardiovascular: Regular rate and rhythm, no murmurs / rubs / gallops. No extremity edema. 2+ pedal pulses. No carotid bruits.  Abdomen: Obese abdomen, mild tenderness at the right upper quadrant, no masses palpated, no hepatosplenomegaly. Bowel sounds positive.  Musculoskeletal: no clubbing / cyanosis. No joint deformity upper and lower extremities. Good ROM, no contractures, no atrophy. Normal muscle tone.  Skin: no rashes, lesions, ulcers. No induration Neurologic: Sensation intact. Strength 5/5 in all 4.  Psychiatric: Normal judgment and insight. Alert and oriented x 3. Normal mood.   EKG: independently reviewed, showing sinus rhythm, with a rate of 98, QTc 602  Chest x-ray on Admission: I personally reviewed and I agree with radiologist reading as below.  CT Head Wo Contrast  Result Date: 10/03/2020 CLINICAL DATA:  Headache, intracranial hemorrhage suspected EXAM: CT HEAD WITHOUT CONTRAST TECHNIQUE: Contiguous axial images were obtained from the base of the skull through  the vertex without intravenous contrast. COMPARISON:  10/01/2020 FINDINGS: Brain: No evidence of acute infarction, hemorrhage, hydrocephalus, extra-axial collection or mass lesion/mass effect. Mild periventricular and deep white matter hypodensity. Vascular: No hyperdense vessel or unexpected calcification. Skull: Normal. Negative for fracture or focal lesion. Sinuses/Orbits: No acute finding. Other: None. IMPRESSION: 1. No acute intracranial pathology. No non-contrast CT findings to explain headache. Specifically, no evidence of intracranial hemorrhage. 2.  Small-vessel white matter disease. Electronically Signed   By: Eddie Candle M.D.   On: 10/03/2020 12:06   CT ABDOMEN PELVIS W CONTRAST  Addendum Date: 10/03/2020   ADDENDUM REPORT: 10/03/2020 14:10 ADDENDUM: ,There is soft tissue fullness along the anterior right lower rib cartilage, including on 14/2. This is new since 02/19/2020, also highly suspicious for metastatic disease. Electronically Signed   By: Abigail Miyamoto M.D.   On: 10/03/2020 14:10   Result Date: 10/03/2020 CLINICAL DATA:  Right-sided abdominal pain for about a week with nausea and vomiting x2. Constipation. Remote breast cancer history. EXAM: CT ABDOMEN AND PELVIS WITH CONTRAST TECHNIQUE: Multidetector CT imaging of the abdomen and pelvis was performed using the standard protocol following bolus administration of intravenous contrast. CONTRAST:  158mL OMNIPAQUE  IOHEXOL 300 MG/ML  SOLN COMPARISON:  Multiple priors, including MRCP of 09/26/2020, abdominal CT of 02/19/2020, abdominal MRI 03/07/2020. FINDINGS: Lower chest: Right base atelectasis. Small right pleural effusion is new since the prior CT. Normal heart size with multivessel coronary artery atherosclerosis. Left-sided calcification or surgical clips. Deep to the presumed lumpectomy site is a centrally hypoattenuating lesion of 2.8 x 2.2 cm on 05/02. This is likely not imaged on the prior CT, but present on the 03/07/2020 MRI where it  was grossly similar. Hepatobiliary: Multiple hepatic masses are again identified. A high right hepatic lobe lesion measures 2.6 cm on 13/2 versus 1.8 cm on 02/19/2020. A wedge-shaped mass within segments 4 and 2 of the liver measures on the order of 10.5 x 5.1 cm on 19/2. Significantly enlarged from 02/19/2020, where lesions in this region measured up to 3.0 cm. Increased from on the order of 3.3 cm on 03/07/2020. Cholecystectomy without biliary duct dilatation. Pancreas: The pancreas demonstrates similar atrophy with moderate duct dilatation at 9 mm on 29/2. Duct dilatation is followed to the level of the pancreatic head, with there is mild soft tissue fullness but no well-defined mass. Example 34/2. This is unchanged. The pancreatic tail demonstrates developing relative soft tissue fullness compared to the pancreatic body. Example at on the order of 2.3 cm in thickness on 25/2 versus 1.4 cm on 02/19/2020. Spleen: Normal in size, without focal abnormality. Adrenals/Urinary Tract: Normal adrenal glands. Upper pole left renal lesion of 1.6 cm is greater than fluid density but was determined a complex cyst on prior MRI. Normal right kidney. Contrast in the urinary bladder secondary to reported outside CT performed yesterday. Stomach/Bowel: Tiny hiatal hernia. Normal distal stomach. Moderate amount of stool within the rectum. Scattered colonic diverticula. Normal terminal ileum. Normal small bowel caliber. Vascular/Lymphatic: Aortic atherosclerosis. Diffuse portal vein thrombosis again identified. Thrombus involves the superior aspect of the superior mesenteric vein, chronic. New and progressive abdominal adenopathy. An aortocaval node measures 1.0 cm on 39/2 versus 7 mm on 02/19/2020 (when remeasured). A necrotic node along the celiac, just posterior to the pancreatic body measures 1.9 cm on 30/2 and is new. Developing adenopathy in the gastrohepatic ligament including at 1.0 cm on 24/2. More ill-defined  hypoattenuating lesions in the peripancreatic space, including at 2.6 cm in the portacaval space on 29/2, increased from 2.3 cm on 02/19/2020. No pelvic sidewall adenopathy. Reproductive: Hysterectomy.  No adnexal mass. Other: Since 02/19/2020, development of small volume abdominopelvic ascites. Peritoneal nodularity, including within the pelvic cul-de-sac on 78/2 and along the right-side of the abdomen on 39/2. No free intraperitoneal air. A new periumbilical nodule or node measures 1.9 cm on 57/2. Musculoskeletal: No acute osseous abnormality. Trace L4-5 anterolisthesis. Degenerate disc disease at the lumbosacral junction. IMPRESSION: 1. When compared to multiple prior exams, including most direct comparison to the 02/19/2020 CT, new or progressive metastatic disease, most definite involving necrotic abdominal nodes and the peritoneum as detailed above. Developing periumbilical nodularity, also likely metastatic. 2. Progressive liver lesions, also most likely indicative of metastatic disease. In the setting of portal vein thrombosis, progressive abscesses are possible but less likely. 3. Abnormal appearance of the pancreas, at least partially felt to be attributed to chronic pancreatitis. Developing soft tissue fullness within the pancreatic tail, with considerations of primary adenocarcinoma, metastatic disease, or superimposed acute pancreatitis. Peripancreatic hypoattenuating lesions, some of which are increased compared to 02/19/2020. These could be secondary to prior pancreatitis or also represent necrotic nodal metastasis. 4. Left breast mass in  the setting of prior lumpectomy for carcinoma. Suspicious for new breast primary, incompletely imaged. 5.  Possible constipation. 6. New small volume abdominopelvic ascites. 7. Similar small right pleural effusion compared to the MRI of 09/26/2020. Electronically Signed: By: Abigail Miyamoto M.D. On: 10/03/2020 12:32   Labs on Admission: I have personally reviewed  following labs  CBC: Recent Labs  Lab 10/01/20 0000 10/03/20 1028  WBC 12.1* 11.7*  NEUTROABS 9,353*  --   HGB 9.8* 10.0*  HCT 30.8* 31.5*  MCV 84.4 83.6  PLT 249 585   Basic Metabolic Panel: Recent Labs  Lab 10/01/20 0000 10/03/20 1028  NA  --  139  K 4.3 4.1  CL  --  107  CO2  --  22  GLUCOSE  --  120*  BUN  --  19  CREATININE  --  0.71  CALCIUM  --  9.4  MG 1.9  --    GFR: Estimated Creatinine Clearance: 61.5 mL/min (by C-G formula based on SCr of 0.71 mg/dL).  Liver Function Tests: Recent Labs  Lab 10/03/20 1028  AST 22  ALT 9  ALKPHOS 98  BILITOT 1.0  PROT 6.8  ALBUMIN 2.9*   Recent Labs  Lab 10/03/20 1028  LIPASE 39   Recent Labs  Lab 10/03/20 1127  AMMONIA 9   Coagulation Profile: Recent Labs  Lab 10/03/20 1408  INR 1.4*   Anemia Panel: Recent Labs    10/01/20 0000  FERRITIN 76  TIBC 251  IRON 20*   Urine analysis:    Component Value Date/Time   COLORURINE AMBER (A) 10/03/2020 1153   APPEARANCEUR CLEAR (A) 10/03/2020 1153   LABSPEC >1.046 (H) 10/03/2020 1153   PHURINE 5.0 10/03/2020 1153   GLUCOSEU NEGATIVE 10/03/2020 1153   HGBUR NEGATIVE 10/03/2020 1153   BILIRUBINUR NEGATIVE 10/03/2020 1153   KETONESUR 5 (A) 10/03/2020 1153   PROTEINUR 30 (A) 10/03/2020 1153   NITRITE NEGATIVE 10/03/2020 1153   LEUKOCYTESUR NEGATIVE 10/03/2020 1153   Theresa Andrews D.O. [facility]  If 7PM-7AM, please contact overnight-coverage provider If 7AM-7PM, please contact day coverage provider [email or web address]  10/03/2020, 10:32 PM

## 2020-10-03 NOTE — ED Triage Notes (Signed)
Pt from home via ems, states that she has been having Right sided abd pain for about a week with some nausea and vomiting x 2. Pt reports last BM was 4-5 days ago. Also c/o HA, but denies at this time. Per EMS daughter stated that pt has been having some AMS for a couple weeks.

## 2020-10-03 NOTE — Telephone Encounter (Signed)
Pt daughter huyen perazzo said dr Ancil Boozer told her to take her mother er. Pt sister took mom  to [facility] er for infection after waiting 7 hrs and was told it would be another 12 hrs before she would be seen. The patient left [facility] and daughter Lattie Haw does not know want to do. Please advise. Pt daughter said MRI of stomach was never completed and would like to know if dr Ancil Boozer would order another mri of stomach today

## 2020-10-03 NOTE — Consult Note (Signed)
[place] NOTE  Patient Care Team: Steele Sizer, MD as PCP - General (Family Medicine) Cammie Sickle, MD as Consulting Physician (Oncology) Corey Skains, MD as Consulting Physician (Cardiology) Gabriel Carina Betsey Holiday, MD as Consulting Physician (Endocrinology) Baxter Kail, MD as Consulting Physician (Dermatology) Anell Barr, OD as Consulting Physician (Optometry) Pieter Partridge, MD as Referring Physician (Dermatology) Vern Claude, LCSW as Social Worker [place], Babs Sciara, RN as Registered Nurse Germaine Pomfret, [facility] (Pharmacist)  CHIEF COMPLAINTS/PURPOSE OF CONSULTATION: Multiple liver lesions/portal vein thrombosis  HISTORY OF PRESENTING ILLNESS:  Theresa Andrews 75 y.o.  female with remote history of breast cancer stage II ER/PR positive HER-2 negative [2011]-and more recently noted to have portal venous thrombosis in August 2021 is currently admitted to the hospital for worsening abdominal pain.  Patient diagnosed with portal venous thrombosis in August 2021 had been on Eliquis.  Patient had been clinically well until the last few weeks.  Patient noted to have worsening abdominal pain/generalized weakness.  Patient was brought to the hospital for further evaluation.  Unfortunately abdominal pelvis continue malignancy- CT scan- IMPRESSION: 1. When compared to multiple prior exams, including most direct comparison to the 02/19/2020 CT, new or progressive metastatic disease, most definite involving necrotic abdominal nodes and the peritoneum as detailed above. Developing periumbilical nodularity, also likely metastatic. 2. Progressive liver lesions, also most likely indicative of metastatic disease. In the setting of portal vein thrombosis, progressive abscesses are possible but less likely. 3. Abnormal appearance of the pancreas, at least partially felt to be attributed to chronic pancreatitis. Developing soft tissue fullness within the  pancreatic tail, with considerations of primary adenocarcinoma, metastatic disease, or superimposed acute pancreatitis. Peripancreatic hypoattenuating lesions, some of which are increased compared to 02/19/2020. These could be secondary to prior pancreatitis or also represent necrotic nodal metastasis. 4. Left breast mass in the setting of prior lumpectomy for carcinoma. Suspicious for new breast primary, incompletely imaged. 5.  Possible constipation. 6. New small volume abdominopelvic ascites. 7. Similar small right pleural effusion compared to the MRI of 09/26/2020.  At this patient is resting comfortably in the bed.  She is accompanied by her daughter.    Review of Systems  Constitutional: Positive for malaise/fatigue and weight loss. Negative for chills, diaphoresis and fever.  HENT: Negative for nosebleeds and sore throat.   Eyes: Negative for double vision.  Respiratory: Negative for cough, hemoptysis, sputum production, shortness of breath and wheezing.   Cardiovascular: Negative for chest pain, palpitations, orthopnea and leg swelling.  Gastrointestinal: Positive for abdominal pain and nausea. Negative for blood in stool, constipation, diarrhea, heartburn, melena and vomiting.  Genitourinary: Negative for dysuria, frequency and urgency.  Musculoskeletal: Positive for back pain. Negative for joint pain.  Skin: Negative.  Negative for itching and rash.  Neurological: Positive for weakness. Negative for dizziness, tingling, focal weakness and headaches.  Endo/Heme/Allergies: Does not bruise/bleed easily.  Psychiatric/Behavioral: Negative for depression. The patient is not nervous/anxious and does not have insomnia.      MEDICAL HISTORY:  Past Medical History:  Diagnosis Date  . Anxiety   . Breast cancer ([place])    pT2 pN0 cM0 (stage IIA) invasive mammary carcinoma of left outer breast status post lumpectomy and sentinel node study on July 14, 2010  . CAD (coronary artery  disease)   . Depression   . H/O hypokalemia   . History of chemotherapy   . History of MI (myocardial infarction)   . Hyperglycemia   .  Hyperlipidemia   . Hypertension   . Peripheral neuropathy due to chemotherapy ([place])   . Personal history of chemotherapy 2011   left breast ca  . Personal history of radiation therapy 2001   left breast ca    SURGICAL HISTORY: Past Surgical History:  Procedure Laterality Date  . ABDOMINAL HYSTERECTOMY    . BREAST BIOPSY Left 2008   neg  . BREAST BIOPSY Left 03/05/2010   invasive mammary carcinoma  . BREAST LUMPECTOMY Left 07/14/2010   INVASIVE MAMMARY CARCINOMA WITH PROMINENT INFILTRATIVE PATTERN, negative margins  . CHOLECYSTECTOMY    . COLONOSCOPY WITH PROPOFOL N/A 11/22/2015   Procedure: COLONOSCOPY WITH PROPOFOL;  Surgeon: Hulen Luster, MD;  Location: [facility] END[facility]COPY;  Service: Gastroenterology;  Laterality: N/A;  . CORONARY ANGIOPLASTY WITH STENT PLACEMENT  2008    SOCIAL HISTORY: Social History   Socioeconomic History  . Marital status: Divorced    Spouse name: Not on file  . Number of children: 3  . Years of education: some college, no degree  . Highest education level: 12th grade  Occupational History    Employer: RETIRED  Tobacco Use  . Smoking status: Former Smoker    Packs/day: 0.25    Years: 25.00    Pack years: 6.25    Types: Cigarettes    Quit date: 2008    Years since quitting: 14.1  . Smokeless tobacco: Never Used  . Tobacco comment: smoking cessation materials not required  Vaping Use  . Vaping Use: Never used  Substance and Sexual Activity  . Alcohol use: No    Comment: rare; holidays  . Drug use: No  . Sexual activity: Not Currently  Other Topics Concern  . Not on file  Social History Narrative   Pt lives alone   Social Determinants of Health   Financial Resource Strain: Low Risk   . Difficulty of Paying Living Expenses: Not hard at all  Food Insecurity: No Food Insecurity  . Worried About Ship broker in the Last Year: Never true  . Ran Out of Food in the Last Year: Never true  Transportation Needs: No Transportation Needs  . Lack of Transportation (Medical): No  . Lack of Transportation (Non-Medical): No  Physical Activity: Inactive  . Days of Exercise per Week: 0 days  . Minutes of Exercise per Session: 0 min  Stress: No Stress Concern Present  . Feeling of Stress : Not at all  Social Connections: Socially Isolated  . Frequency of Communication with Friends and Family: More than three times a week  . Frequency of Social Gatherings with Friends and Family: Three times a week  . Attends Religious Services: Never  . Active Member of Clubs or Organizations: No  . Attends Archivist Meetings: Never  . Marital Status: Divorced  Human resources officer Violence: Not At Risk  . Fear of Current or Ex-Partner: No  . Emotionally Abused: No  . Physically Abused: No  . Sexually Abused: No    FAMILY HISTORY: Family History  Problem Relation Age of Onset  . Breast cancer Sister 32  . Hypertension Mother   . Aneurysm Mother   . Heart attack Sister   . Birth defects Sister     ALLERGIES:  is allergic to tetanus toxoids and shellfish allergy.  MEDICATIONS:  Current Facility-Administered Medications  Medication Dose Route Frequency Provider Last Rate Last Admin  . acetaminophen (TYLENOL) tablet 325 mg  325 mg Oral Q6H PRN Cox, Amy N, DO  Or  . acetaminophen (TYLENOL) suppository 325 mg  325 mg Rectal Q6H PRN Cox, Amy N, DO      . amLODipine (NORVASC) tablet 10 mg  10 mg Oral Daily Cox, Amy N, DO   10 mg at 10/03/20 1622  . cholecalciferol (VITAMIN D3) tablet 1,000 Units  1,000 Units Oral Daily Cox, Amy N, DO   1,000 Units at 10/03/20 1622  . heparin ADULT infusion 100 units/mL (25000 units/260m)  1,200 Units/hr Intravenous Continuous Cox, Amy N, DO 12 mL/hr at 10/03/20 1458 1,200 Units/hr at 10/03/20 1458  . irbesartan (AVAPRO) tablet 150 mg  150 mg Oral Daily Cox, Amy  N, DO   150 mg at 10/03/20 1623  . metoprolol succinate (TOPROL-XL) 24 hr tablet 12.5 mg  12.5 mg Oral Daily Cox, Amy N, DO   12.5 mg at 10/03/20 1623  . ondansetron (ZOFRAN) tablet 4 mg  4 mg Oral Q6H PRN Cox, Amy N, DO       Or  . ondansetron (ZOFRAN) injection 4 mg  4 mg Intravenous Q6H PRN Cox, Amy N, DO      . PARoxetine (PAXIL) tablet 10 mg  10 mg Oral Daily Cox, Amy N, DO   10 mg at 10/03/20 1623  . rosuvastatin (CRESTOR) tablet 10 mg  10 mg Oral Daily Cox, Amy N, DO   10 mg at 10/03/20 1623      .  PHYSICAL EXAMINATION:  Vitals:   10/03/20 1810 10/03/20 2007  BP: 133/82 124/64  Pulse: 97 84  Resp: 14 18  Temp: 97.8 F (36.6 C) 97.8 F (36.6 C)  SpO2: 100% 99%   Filed Weights   10/03/20 1025  Weight: 167 lb (75.8 kg)    Physical Exam Constitutional:      Comments: Patient is accompanied by daughter.  She is laying in the stretcher.  HENT:     Head: Normocephalic and atraumatic.     Mouth/Throat:     Pharynx: No oropharyngeal exudate.  Eyes:     Pupils: Pupils are equal, round, and reactive to light.  Cardiovascular:     Rate and Rhythm: Normal rate and regular rhythm.  Pulmonary:     Effort: No respiratory distress.     Breath sounds: No wheezing.     Comments: Clear to auscultation bilaterally.  No wheeze or crackles. Abdominal:     General: Bowel sounds are normal. There is no distension.     Palpations: Abdomen is soft. There is no mass.     Tenderness: There is no abdominal tenderness. There is no guarding or rebound.  Musculoskeletal:        General: No tenderness. Normal range of motion.     Cervical back: Normal range of motion and neck supple.  Skin:    General: Skin is warm.     Comments: Left breast upper outer quadrant-3 to 4 cm mobile breast mass noted. (In the area of previous lumpectomy scar).  No axial adenopathy.  2 to 3 cm mass noted adjacent to inferior xiphoid process.  Neurological:     Mental Status: She is alert and oriented to  person, place, and time.  Psychiatric:        Mood and Affect: Affect normal.      LABORATORY DATA:  I have reviewed the data as listed Lab Results  Component Value Date   WBC 11.7 (H) 10/03/2020   HGB 10.0 (L) 10/03/2020   HCT 31.5 (L) 10/03/2020   MCV 83.6 10/03/2020  PLT 225 10/03/2020   Recent Labs    04/25/20 0852 05/29/20 1344 08/08/20 0000 08/08/20 0958 08/21/20 0903 10/01/20 0000 10/03/20 1028  NA 141 142 141 137 136  --  139  K 2.8* 3.2* 3.5 2.8* 4.5 4.3 4.1  CL 103 106 105 103 103  --  107  CO2 29 24 27 25 24   --  22  GLUCOSE 139* 115* 129* 149* 150*  --  120*  BUN 7* 9 9 10 14   --  19  CREATININE 0.66 0.61 0.63 0.66 0.69  --  0.71  CALCIUM 9.3 10.1 10.3 9.9 10.8*  --  9.4  GFRNONAA >60 90 89 >60 >60  --  >60  GFRAA >60 104 103  --   --   --   --   PROT 7.3 6.6 7.3 7.8 8.0  --  6.8  ALBUMIN 3.6 3.9  --  3.6 3.6  --  2.9*  AST 20 27 22 26 20   --  22  ALT 15 22 16 19 13   --  9  ALKPHOS 101 131*  --  119 123  --  98  BILITOT 0.9 0.4 0.4 0.4 0.5  --  1.0    RADIOGRAPHIC STUDIES: I have personally reviewed the radiological images as listed and agreed with the findings in the report. CT Head Wo Contrast  Result Date: 10/03/2020 CLINICAL DATA:  Headache, intracranial hemorrhage suspected EXAM: CT HEAD WITHOUT CONTRAST TECHNIQUE: Contiguous axial images were obtained from the base of the skull through the vertex without intravenous contrast. COMPARISON:  10/01/2020 FINDINGS: Brain: No evidence of acute infarction, hemorrhage, hydrocephalus, extra-axial collection or mass lesion/mass effect. Mild periventricular and deep white matter hypodensity. Vascular: No hyperdense vessel or unexpected calcification. Skull: Normal. Negative for fracture or focal lesion. Sinuses/Orbits: No acute finding. Other: None. IMPRESSION: 1. No acute intracranial pathology. No non-contrast CT findings to explain headache. Specifically, no evidence of intracranial hemorrhage. 2.   Small-vessel white matter disease. Electronically Signed   By: Eddie Candle M.D.   On: 10/03/2020 12:06   CT Head Wo Contrast  Result Date: 10/01/2020 CLINICAL DATA:  Altered mental status, unsteady gait EXAM: CT HEAD WITHOUT CONTRAST TECHNIQUE: Contiguous axial images were obtained from the base of the skull through the vertex without intravenous contrast. COMPARISON:  None. FINDINGS: Brain: No evidence of acute infarction, hemorrhage, hydrocephalus, extra-axial collection or mass lesion/mass effect. Scattered low-density changes within the periventricular and subcortical white matter compatible with chronic microvascular ischemic change. Mild diffuse cerebral volume loss. Vascular: Atherosclerotic calcifications involving the large vessels of the skull base. No unexpected hyperdense vessel. Skull: Normal. Negative for fracture or focal lesion. Sinuses/Orbits: No acute finding. Other: None. IMPRESSION: 1. No acute intracranial findings. 2. Mild chronic microvascular ischemic change and cerebral volume loss. Electronically Signed   By: Davina Poke D.O.   On: 10/01/2020 16:28   CT ABDOMEN PELVIS W CONTRAST  Addendum Date: 10/03/2020   ADDENDUM REPORT: 10/03/2020 14:10 ADDENDUM: ,There is soft tissue fullness along the anterior right lower rib cartilage, including on 14/2. This is new since 02/19/2020, also highly suspicious for metastatic disease. Electronically Signed   By: Abigail Miyamoto M.D.   On: 10/03/2020 14:10   Result Date: 10/03/2020 CLINICAL DATA:  Right-sided abdominal pain for about a week with nausea and vomiting x2. Constipation. Remote breast cancer history. EXAM: CT ABDOMEN AND PELVIS WITH CONTRAST TECHNIQUE: Multidetector CT imaging of the abdomen and pelvis was performed using the standard protocol following bolus  administration of intravenous contrast. CONTRAST:  186m OMNIPAQUE IOHEXOL 300 MG/ML  SOLN COMPARISON:  Multiple priors, including MRCP of 09/26/2020, abdominal CT of  02/19/2020, abdominal MRI 03/07/2020. FINDINGS: Lower chest: Right base atelectasis. Small right pleural effusion is new since the prior CT. Normal heart size with multivessel coronary artery atherosclerosis. Left-sided calcification or surgical clips. Deep to the presumed lumpectomy site is a centrally hypoattenuating lesion of 2.8 x 2.2 cm on 05/02. This is likely not imaged on the prior CT, but present on the 03/07/2020 MRI where it was grossly similar. Hepatobiliary: Multiple hepatic masses are again identified. A high right hepatic lobe lesion measures 2.6 cm on 13/2 versus 1.8 cm on 02/19/2020. A wedge-shaped mass within segments 4 and 2 of the liver measures on the order of 10.5 x 5.1 cm on 19/2. Significantly enlarged from 02/19/2020, where lesions in this region measured up to 3.0 cm. Increased from on the order of 3.3 cm on 03/07/2020. Cholecystectomy without biliary duct dilatation. Pancreas: The pancreas demonstrates similar atrophy with moderate duct dilatation at 9 mm on 29/2. Duct dilatation is followed to the level of the pancreatic head, with there is mild soft tissue fullness but no well-defined mass. Example 34/2. This is unchanged. The pancreatic tail demonstrates developing relative soft tissue fullness compared to the pancreatic body. Example at on the order of 2.3 cm in thickness on 25/2 versus 1.4 cm on 02/19/2020. Spleen: Normal in size, without focal abnormality. Adrenals/Urinary Tract: Normal adrenal glands. Upper pole left renal lesion of 1.6 cm is greater than fluid density but was determined a complex cyst on prior MRI. Normal right kidney. Contrast in the urinary bladder secondary to reported outside CT performed yesterday. Stomach/Bowel: Tiny hiatal hernia. Normal distal stomach. Moderate amount of stool within the rectum. Scattered colonic diverticula. Normal terminal ileum. Normal small bowel caliber. Vascular/Lymphatic: Aortic atherosclerosis. Diffuse portal vein thrombosis again  identified. Thrombus involves the superior aspect of the superior mesenteric vein, chronic. New and progressive abdominal adenopathy. An aortocaval node measures 1.0 cm on 39/2 versus 7 mm on 02/19/2020 (when remeasured). A necrotic node along the celiac, just posterior to the pancreatic body measures 1.9 cm on 30/2 and is new. Developing adenopathy in the gastrohepatic ligament including at 1.0 cm on 24/2. More ill-defined hypoattenuating lesions in the peripancreatic space, including at 2.6 cm in the portacaval space on 29/2, increased from 2.3 cm on 02/19/2020. No pelvic sidewall adenopathy. Reproductive: Hysterectomy.  No adnexal mass. Other: Since 02/19/2020, development of small volume abdominopelvic ascites. Peritoneal nodularity, including within the pelvic cul-de-sac on 78/2 and along the right-side of the abdomen on 39/2. No free intraperitoneal air. A new periumbilical nodule or node measures 1.9 cm on 57/2. Musculoskeletal: No acute osseous abnormality. Trace L4-5 anterolisthesis. Degenerate disc disease at the lumbosacral junction. IMPRESSION: 1. When compared to multiple prior exams, including most direct comparison to the 02/19/2020 CT, new or progressive metastatic disease, most definite involving necrotic abdominal nodes and the peritoneum as detailed above. Developing periumbilical nodularity, also likely metastatic. 2. Progressive liver lesions, also most likely indicative of metastatic disease. In the setting of portal vein thrombosis, progressive abscesses are possible but less likely. 3. Abnormal appearance of the pancreas, at least partially felt to be attributed to chronic pancreatitis. Developing soft tissue fullness within the pancreatic tail, with considerations of primary adenocarcinoma, metastatic disease, or superimposed acute pancreatitis. Peripancreatic hypoattenuating lesions, some of which are increased compared to 02/19/2020. These could be secondary to prior pancreatitis or also  represent necrotic nodal metastasis. 4. Left breast mass in the setting of prior lumpectomy for carcinoma. Suspicious for new breast primary, incompletely imaged. 5.  Possible constipation. 6. New small volume abdominopelvic ascites. 7. Similar small right pleural effusion compared to the MRI of 09/26/2020. Electronically Signed: By: Abigail Miyamoto M.D. On: 10/03/2020 12:32   MR ABDOMEN MRCP WO CONTRAST  Result Date: 09/27/2020 CLINICAL DATA:  Pancreatitis. Pancreatic and liver lesions on prior MRI. Abdominal pain. EXAM: MRI ABDOMEN WITHOUT CONTRAST  (INCLUDING MRCP) TECHNIQUE: Multiplanar multisequence MR imaging of the abdomen was performed. Heavily T2-weighted images of the biliary and pancreatic ducts were obtained, and three-dimensional MRCP images were rendered by post processing. COMPARISON:  03/07/2020 MRI FINDINGS: Despite efforts by the technologist and patient, severe motion artifact is present on today's exam and could not be eliminated. This reduces exam sensitivity and specificity. Due to pain, the patient terminated the exam prior to obtaining full diagnostic sequences. Lower chest: Small right pleural effusion is new compared to prior MRI. Hepatobiliary: There is been evolution of the previous regions of stippled T2 signal hyperintensity scattered in the liver to current appearance of hazy accentuated T2 signal for example on image 9 of series 4. On biopsy, these were felt to represent areas of periductal fibrosis, chronic lymphoplasmacytic infiltrate, hemosiderin deposition, and regenerative hyperplasia suggesting finding secondary to portal vein thrombosis. This process is significantly expanded throughout segment 4a and 4B of the liver which not demonstrates diffuse hazy accentuated T2 signal as shown on image 14 series 4. There is associated reduced T1 signal in these regions of involvement. No steatosis. There is some associated restriction of diffusion in these regions of involvement.  Unfortunately the MRCP images are severely blurred. No obvious extrahepatic biliary dilatation. There is some mild narrowing of the common hepatic duct along the porta hepatis for example on image 16 series 3, probably due to surrounding soft tissue swelling and possibly periportal fibrosis. As before, there is high T2 signal intensity material throughout the somewhat dilated intrahepatic portal venous system probably from thrombosis. Periportal edema noted. Pancreas: Peripancreatic edema noted with stippled accentuated T2 signal in the pancreas, and substantially dilated segments of the dorsal pancreatic duct especially in the pancreatic body, up to 1.0 cm in diameter. T2 signal hyperintensity partially involving the pancreatic head measuring about 4.5 by 1.6 by 2.6 cm, extending cephalad along the porta hepatis, I am uncertain whether this represents a thrombosed varix from the portal vein, pseudocyst, or a cystic pancreatic neoplasm. This appears roughly similar to the prior exam. Spleen:  Unremarkable Adrenals/Urinary Tract: Small renal lesions of varying complexity are present and probably a combination of simple and complex cysts, although enhancement characteristics are not interrogated today. The adrenal glands appear normal. Stomach/Bowel: Unremarkable Vascular/Lymphatic: Aortoiliac atherosclerotic vascular disease. 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 Collaterals along the porta hepatis. Other: Ascites particularly along the paracolic gutters and in the pelvis (pelvic ascites seen on coronal images). Mesenteric edema mildly improved in the central mesentery compared to previous. Omental edema noted. There is hazy stranding in the root of the mesentery as on prior exams. Musculoskeletal: Lower lumbar spondylosis and degenerative disc disease. IMPRESSION: 1. Despite efforts by the technologist  and patient, severe motion artifact is present on today's exam and could not be eliminated. The patient was also unable to complete the full sequence of the exam. This reduces exam sensitivity and specificity. 2.  Evolution of the appearance in the liver with the previously stippled T2 signal hyperintensities now appearing as hazy increased T2 signal, and with substantially increased involvement throughout segment 4 and 4B of the liver. Based on prior biopsy results this likely represents combination of fibrosis, lymphoplasmacytic infiltrate, hemosiderin deposition, and periportal hyperplasia resulting from portal vein thrombosis. 3. 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 No obvious extrahepatic biliary dilatation. There is some mild narrowing of the common hepatic duct along the porta hepatis, probably due to surrounding soft tissue swelling and possibly periportal fibrosis. 4. Peripancreatic edema with stippled accentuated T2 signal in the pancreas, and substantially dilated segments of the dorsal pancreatic duct especially in the pancreatic body. 5. Lobulated T2 signal hyperintensity along the pancreatic head, possibly a pseudocyst or a thrombosed varix of the portal vein, less likely cystic pancreatic neoplasm. 6. Small right pleural effusion is new compared to previous MRI. 7. Reduced central mesenteric edema compared to previous, although there is still some peripancreatic edema as well as mesenteric edema and ascites. 8. Lower lumbar spondylosis and degenerative disc disease. Electronically Signed   By: Van Clines M.D.   On: 09/27/2020 16:11    Lesion of liver #75 year old female patient with remote history of breast cancer; and history of portal vein thrombosis unclear etiology-on Eliquis is currently in the hospital for worsening abdominal pain.  #Worsening abdominal  pain-imaging findings highly concerning for malignancy-worsening liver lesions; soft tissue fullness in the pancreatic bed; peritoneal nodules; soft tissue nodule right lower xiphoid process.  Patient's complicated imaging reviewed at the tumor conference on 03/10.  #History of left breast cancer stage II ER/PR positive HER-2 negative [2011] currently on surveillance/not on active therapy.  However incidentally mass noted abdomen pelvis CT scan-left breast-question recurrence.  #History of portal vein thrombosis unclear etiology-previously Eliquis-worsening-unclear etiology.  Recommendations:  #Recommend tissue diagnosis.  As reviewed at the tumor conference-we will plan ultrasound-guided biopsy of soft tissue lesion right lower adjacent xiphoid process.  Recommend holding IV heparin 6 hours prior to procedure.  N.p.o. post midnight.  Will order tumor markers.  # Thank you for allowing me to participate in the care of your pleasant patient. Please do not hesitate to contact me with questions or concerns in the interim.  Discussed with Dr. Tobie Poet.  Also discussed with the patient's daughter by the bedside.  # I reviewed the blood work- with the patient in detail; also reviewed the imaging independently [as summarized above]; and with the patient in detail.   All questions were answered. The patient knows to call the clinic with any problems, questions or concerns.    Cammie Sickle, MD 10/03/2020 10:10 PM

## 2020-10-03 NOTE — ED Provider Notes (Signed)
[facility] Emergency Department Provider Note  ____________________________________________   Event Date/Time   First MD Initiated Contact with Patient 10/03/20 1050     (approximate)  I have reviewed the triage vital signs and the nursing notes.   HISTORY  Chief Complaint Abdominal Pain    HPI Theresa Andrews is a 75 y.o. female with past medical history of hypertension, hyperlipidemia, recently diagnosed portal vein thrombosis on anticoagulation, here with altered mental status, nausea, abdominal pain.  The patient reportedly has had increasing confusion over the last several days.  She was here by her PCP and had a fairly unremarkable work-up several days ago.  She did note a leukocytosis and was told to come into the ED today after family called to report ongoing confusion as well as some new right upper quadrant abdominal pain.  Patient has had some nausea.  Patient had a headache this morning as well, which is new for her.  No overt urinary symptoms, although patient has had some urinary frequency.  No cough.  No shortness of breath.  No known Covid exposures. No specific alleviating or aggravating factors.       Past Medical History:  Diagnosis Date  . Anxiety   . Breast cancer ([place])    pT2 pN0 cM0 (stage IIA) invasive mammary carcinoma of left outer breast status post lumpectomy and sentinel node study on July 14, 2010  . CAD (coronary artery disease)   . Depression   . H/O hypokalemia   . History of chemotherapy   . History of MI (myocardial infarction)   . Hyperglycemia   . Hyperlipidemia   . Hypertension   . Peripheral neuropathy due to chemotherapy ([place])   . Personal history of chemotherapy 2011   left breast ca  . Personal history of radiation therapy 2001   left breast ca    Patient Active Problem List   Diagnosis Date Noted  . Abdominal pain 10/03/2020  . Lesion of liver 03/11/2020  . Bradycardia 10/04/2018  . Obesity (BMI  30-39.9) 05/12/2018  . Statin myopathy 05/12/2018  . Vitamin D deficiency 01/09/2018  . Hyperparathyroidism ([place]) 01/07/2018  . Carcinoma of overlapping sites of left breast in female, estrogen receptor positive ([place]) 03/27/2016  . Hypercalcemia 08/20/2015  . Diuretic-induced hypokalemia 04/29/2015  . Abnormal finding on thyroid function test 10/29/2014  . Anxiety disorder 10/29/2014  . Breast CA ([place]) 10/29/2014  . Atherosclerosis of coronary artery 10/29/2014  . Prediabetes 10/29/2014  . Gravida 1 10/29/2014  . Hyperlipidemia 10/29/2014  . Disorder of peripheral nervous system 10/29/2014  . At risk for falling 10/29/2014  . Neoplasm of skin 10/29/2014  . Benign essential HTN 04/25/2014    Past Surgical History:  Procedure Laterality Date  . ABDOMINAL HYSTERECTOMY    . BREAST BIOPSY Left 2008   neg  . BREAST BIOPSY Left 03/05/2010   invasive mammary carcinoma  . BREAST LUMPECTOMY Left 07/14/2010   INVASIVE MAMMARY CARCINOMA WITH PROMINENT INFILTRATIVE PATTERN, negative margins  . CHOLECYSTECTOMY    . COLONOSCOPY WITH PROPOFOL N/A 11/22/2015   Procedure: COLONOSCOPY WITH PROPOFOL;  Surgeon: Hulen Luster, MD;  Location: [facility] ENDOSCOPY;  Service: Gastroenterology;  Laterality: N/A;  . CORONARY ANGIOPLASTY WITH STENT PLACEMENT  2008    Prior to Admission medications   Medication Sig Start Date End Date Taking? Authorizing Provider  amLODipine (NORVASC) 10 MG tablet Take 1 tablet (10 mg total) by mouth daily. 07/29/20   Steele Sizer, MD  aspirin 81 MG  EC tablet Take 81 mg by mouth daily.    Roselee Nova, MD  cholecalciferol (VITAMIN D) 1000 units tablet Take 1,000 Units by mouth daily.    [provider]  enoxaparin (LOVENOX) 40 MG/0.4ML injection Inject 0.4 mLs (40 mg total) into the skin daily. 10/01/20   Steele Sizer, MD  metoprolol succinate (TOPROL-XL) 25 MG 24 hr tablet Take 0.5 tablets (12.5 mg total) by mouth daily. 07/29/20   Steele Sizer, MD  PARoxetine  (PAXIL) 10 MG tablet Take 1 tablet (10 mg total) by mouth daily. 09/13/20   Steele Sizer, MD  potassium chloride SA (KLOR-CON) 20 MEQ tablet 1 pill twice a day 08/08/20   Cammie Sickle, MD  rosuvastatin (CRESTOR) 10 MG tablet Take 1 tablet (10 mg total) by mouth daily. 09/04/20   Steele Sizer, MD  telmisartan (MICARDIS) 80 MG tablet One daily 07/29/20   Steele Sizer, MD  tretinoin (RETIN-A) 0.025 % cream Apply topically at bedtime.  10/23/17   [provider]    Allergies Tetanus toxoids and Shellfish allergy  Family History  Problem Relation Age of Onset  . Breast cancer Sister 24  . Hypertension Mother   . Aneurysm Mother   . Heart attack Sister   . Birth defects Sister     Social History Social History   Tobacco Use  . Smoking status: Former Smoker    Packs/day: 0.25    Years: 25.00    Pack years: 6.25    Types: Cigarettes    Quit date: 2008    Years since quitting: 14.1  . Smokeless tobacco: Never Used  . Tobacco comment: smoking cessation materials not required  Vaping Use  . Vaping Use: Never used  Substance Use Topics  . Alcohol use: No    Comment: rare; holidays  . Drug use: No    Review of Systems  Review of Systems  Constitutional: Positive for fatigue. Negative for fever.  HENT: Negative for congestion and sore throat.   Eyes: Negative for visual disturbance.  Respiratory: Negative for cough and shortness of breath.   Cardiovascular: Negative for chest pain.  Gastrointestinal: Positive for abdominal pain. Negative for diarrhea, nausea and vomiting.  Genitourinary: Negative for flank pain.  Musculoskeletal: Negative for back pain and neck pain.  Skin: Negative for rash and wound.  Neurological: Negative for weakness.  Psychiatric/Behavioral: Positive for confusion.  All other systems reviewed and are negative.    ____________________________________________  PHYSICAL EXAM:      VITAL SIGNS: ED Triage Vitals  Enc Vitals Group      BP 10/03/20 1024 129/83     Pulse Rate 10/03/20 1024 (!) 101     Resp 10/03/20 1024 18     Temp 10/03/20 1024 97.8 F (36.6 C)     Temp Source 10/03/20 1024 Oral     SpO2 10/03/20 1024 100 %     Weight 10/03/20 1025 167 lb (75.8 kg)     Height 10/03/20 1025 5\' 4"  (1.626 m)     Head Circumference --      Peak Flow --      Pain Score 10/03/20 1025 2     Pain Loc --      Pain Edu? --      Excl. in [place]? --      Physical Exam Vitals and nursing note reviewed.  Constitutional:      General: She is not in acute distress.    Appearance: She is well-developed.  HENT:  Head: Normocephalic and atraumatic.  Eyes:     Conjunctiva/sclera: Conjunctivae normal.  Cardiovascular:     Rate and Rhythm: Regular rhythm. Tachycardia present.     Heart sounds: Normal heart sounds. No murmur heard. No friction rub.  Pulmonary:     Effort: Pulmonary effort is normal. No respiratory distress.     Breath sounds: Normal breath sounds. No wheezing or rales.  Abdominal:     General: There is no distension.     Palpations: Abdomen is soft.     Tenderness: There is abdominal tenderness in the right upper quadrant and periumbilical area.  Musculoskeletal:     Cervical back: Neck supple.  Skin:    General: Skin is warm.     Capillary Refill: Capillary refill takes less than 2 seconds.  Neurological:     Mental Status: She is alert and oriented to person, place, and time.     GCS: GCS eye subscore is 4. GCS verbal subscore is 5. GCS motor subscore is 6.     Cranial Nerves: Cranial nerves are intact.     Sensory: Sensation is intact.     Motor: Motor function is intact. No abnormal muscle tone.       ____________________________________________   LABS (all labs ordered are listed, but only abnormal results are displayed)  Labs Reviewed  COMPREHENSIVE METABOLIC PANEL - Abnormal; Notable for the following components:      Result Value   Glucose, Bld 120 (*)    Albumin 2.9 (*)    All  other components within normal limits  CBC - Abnormal; Notable for the following components:   WBC 11.7 (*)    RBC 3.77 (*)    Hemoglobin 10.0 (*)    HCT 31.5 (*)    RDW 17.3 (*)    All other components within normal limits  URINALYSIS, COMPLETE (UACMP) WITH MICROSCOPIC - Abnormal; Notable for the following components:   Color, Urine AMBER (*)    APPearance CLEAR (*)    Specific Gravity, Urine >1.046 (*)    Ketones, ur 5 (*)    Protein, ur 30 (*)    All other components within normal limits  CULTURE, BLOOD (ROUTINE X 2)  CULTURE, BLOOD (ROUTINE X 2)  RESP PANEL BY RT-PCR (FLU A&B, COVID) ARPGX2  LIPASE, BLOOD  AMMONIA  PROTIME-INR  APTT    ____________________________________________  EKG: Normal sinus rhythm, jugular rate 98.  QRS 84, QTc 682.  No acute ST elevations or depressions.  Prolonged QT. ________________________________________  RADIOLOGY All imaging, including plain films, CT scans, and ultrasounds, independently reviewed by me, and interpretations confirmed via formal radiology reads.  ED MD interpretation:   CT Head: NAICA CT A/P: New or progressive metastatic disease in the liver, abnormal appearance of pancreas, lymphadenopathy, progressive liver lesions concerning for worsening portal vein thrombosis  Official radiology report(s): CT Head Wo Contrast  Result Date: 10/03/2020 CLINICAL DATA:  Headache, intracranial hemorrhage suspected EXAM: CT HEAD WITHOUT CONTRAST TECHNIQUE: Contiguous axial images were obtained from the base of the skull through the vertex without intravenous contrast. COMPARISON:  10/01/2020 FINDINGS: Brain: No evidence of acute infarction, hemorrhage, hydrocephalus, extra-axial collection or mass lesion/mass effect. Mild periventricular and deep white matter hypodensity. Vascular: No hyperdense vessel or unexpected calcification. Skull: Normal. Negative for fracture or focal lesion. Sinuses/Orbits: No acute finding. Other: None. IMPRESSION:  1. No acute intracranial pathology. No non-contrast CT findings to explain headache. Specifically, no evidence of intracranial hemorrhage. 2.  Small-vessel white matter disease. Electronically  Signed   By: Eddie Candle M.D.   On: 10/03/2020 12:06   CT ABDOMEN PELVIS W CONTRAST  Result Date: 10/03/2020 CLINICAL DATA:  Right-sided abdominal pain for about a week with nausea and vomiting x2. Constipation. Remote breast cancer history. EXAM: CT ABDOMEN AND PELVIS WITH CONTRAST TECHNIQUE: Multidetector CT imaging of the abdomen and pelvis was performed using the standard protocol following bolus administration of intravenous contrast. CONTRAST:  171mL OMNIPAQUE IOHEXOL 300 MG/ML  SOLN COMPARISON:  Multiple priors, including MRCP of 09/26/2020, abdominal CT of 02/19/2020, abdominal MRI 03/07/2020. FINDINGS: Lower chest: Right base atelectasis. Small right pleural effusion is new since the prior CT. Normal heart size with multivessel coronary artery atherosclerosis. Left-sided calcification or surgical clips. Deep to the presumed lumpectomy site is a centrally hypoattenuating lesion of 2.8 x 2.2 cm on 05/02. This is likely not imaged on the prior CT, but present on the 03/07/2020 MRI where it was grossly similar. Hepatobiliary: Multiple hepatic masses are again identified. A high right hepatic lobe lesion measures 2.6 cm on 13/2 versus 1.8 cm on 02/19/2020. A wedge-shaped mass within segments 4 and 2 of the liver measures on the order of 10.5 x 5.1 cm on 19/2. Significantly enlarged from 02/19/2020, where lesions in this region measured up to 3.0 cm. Increased from on the order of 3.3 cm on 03/07/2020. Cholecystectomy without biliary duct dilatation. Pancreas: The pancreas demonstrates similar atrophy with moderate duct dilatation at 9 mm on 29/2. Duct dilatation is followed to the level of the pancreatic head, with there is mild soft tissue fullness but no well-defined mass. Example 34/2. This is unchanged. The  pancreatic tail demonstrates developing relative soft tissue fullness compared to the pancreatic body. Example at on the order of 2.3 cm in thickness on 25/2 versus 1.4 cm on 02/19/2020. Spleen: Normal in size, without focal abnormality. Adrenals/Urinary Tract: Normal adrenal glands. Upper pole left renal lesion of 1.6 cm is greater than fluid density but was determined a complex cyst on prior MRI. Normal right kidney. Contrast in the urinary bladder secondary to reported outside CT performed yesterday. Stomach/Bowel: Tiny hiatal hernia. Normal distal stomach. Moderate amount of stool within the rectum. Scattered colonic diverticula. Normal terminal ileum. Normal small bowel caliber. Vascular/Lymphatic: Aortic atherosclerosis. Diffuse portal vein thrombosis again identified. Thrombus involves the superior aspect of the superior mesenteric vein, chronic. New and progressive abdominal adenopathy. An aortocaval node measures 1.0 cm on 39/2 versus 7 mm on 02/19/2020 (when remeasured). A necrotic node along the celiac, just posterior to the pancreatic body measures 1.9 cm on 30/2 and is new. Developing adenopathy in the gastrohepatic ligament including at 1.0 cm on 24/2. More ill-defined hypoattenuating lesions in the peripancreatic space, including at 2.6 cm in the portacaval space on 29/2, increased from 2.3 cm on 02/19/2020. No pelvic sidewall adenopathy. Reproductive: Hysterectomy.  No adnexal mass. Other: Since 02/19/2020, development of small volume abdominopelvic ascites. Peritoneal nodularity, including within the pelvic cul-de-sac on 78/2 and along the right-side of the abdomen on 39/2. No free intraperitoneal air. A new periumbilical nodule or node measures 1.9 cm on 57/2. Musculoskeletal: No acute osseous abnormality. Trace L4-5 anterolisthesis. Degenerate disc disease at the lumbosacral junction. IMPRESSION: 1. When compared to multiple prior exams, including most direct comparison to the 02/19/2020 CT, new  or progressive metastatic disease, most definite involving necrotic abdominal nodes and the peritoneum as detailed above. Developing periumbilical nodularity, also likely metastatic. 2. Progressive liver lesions, also most likely indicative of metastatic disease. In the  setting of portal vein thrombosis, progressive abscesses are possible but less likely. 3. Abnormal appearance of the pancreas, at least partially felt to be attributed to chronic pancreatitis. Developing soft tissue fullness within the pancreatic tail, with considerations of primary adenocarcinoma, metastatic disease, or superimposed acute pancreatitis. Peripancreatic hypoattenuating lesions, some of which are increased compared to 02/19/2020. These could be secondary to prior pancreatitis or also represent necrotic nodal metastasis. 4. Left breast mass in the setting of prior lumpectomy for carcinoma. Suspicious for new breast primary, incompletely imaged. 5.  Possible constipation. 6. New small volume abdominopelvic ascites. 7. Similar small right pleural effusion compared to the MRI of 09/26/2020. Electronically Signed   By: Abigail Miyamoto M.D.   On: 10/03/2020 12:32    ____________________________________________  PROCEDURES   Procedure(s) performed (including Critical Care):  .1-3 Lead EKG Interpretation Performed by: Duffy Bruce, MD Authorized by: Duffy Bruce, MD     Interpretation: normal     ECG rate:  80-90   ECG rate assessment: normal     Rhythm: sinus rhythm     Ectopy: none     Conduction: normal   Comments:     Indication: Confusion, weakness    ____________________________________________  INITIAL IMPRESSION / MDM / ASSESSMENT AND PLAN / ED COURSE  As part of my medical decision making, I reviewed the following data within the [place] notes reviewed and incorporated, Old chart reviewed, Notes from prior ED visits, and [place] Controlled Substance Database       *MELIA HOPES was evaluated in Emergency Department on 10/03/2020 for the symptoms described in the history of present illness. She was evaluated in the context of the global COVID-19 pandemic, which necessitated consideration that the patient might be at risk for infection with the SARS-CoV-2 virus that causes COVID-19. Institutional protocols and algorithms that pertain to the evaluation of patients at risk for COVID-19 are in a state of rapid change based on information released by regulatory bodies including the CDC and federal and state organizations. These policies and algorithms were followed during the patient's care in the ED.  Some ED evaluations and interventions may be delayed as a result of limited staffing during the pandemic.*     Medical Decision Making: 75 year old female here with increasing confusion and abdominal pain.  Reviewed patient's extensive recent history.  Concern for possible decompensating liver disease in the setting of severe portal vein thrombosis with possible metastatic disease versus abscesses.  She does have persistent mild leukocytosis, although this could be reactive secondary to her nausea or vomiting more so than infection.  Her imaging and history of cancer is certainly more concerning for this.  Discussed with Dr. Rogue Bussing.  CT repeated today given her worsening pain and leukocytosis, shows significant worsening liver disease as well as possible reactive lymph nodes.  He will evaluate the patient and recommends admission.  Will switch her to heparin as she will likely need repeat liver biopsy.  IV fluids given.  Lab work otherwise shows dehydration with ketonuria and elevated specific gravity.  CBC and CMP reviewed.  Mild leukocytosis noted.  No other acute abnormality.  ____________________________________________  FINAL CLINICAL IMPRESSION(S) / ED DIAGNOSES  Final diagnoses:  Right upper quadrant abdominal pain  Confusion  Weakness  Portal vein thrombosis      MEDICATIONS GIVEN DURING THIS VISIT:  Medications  0.9 %  sodium chloride infusion (has no administration in time range)  iohexol (OMNIPAQUE) 300 MG/ML solution 100 mL (100 mLs  Intravenous Contrast Given 10/03/20 1125)  sodium chloride 0.9 % bolus 500 mL (500 mLs Intravenous New Bag/Given 10/03/20 1319)     ED Discharge Orders    None       Note:  This document was prepared using Dragon voice recognition software and may include unintentional dictation errors.   Duffy Bruce, MD 10/03/20 1348

## 2020-10-03 NOTE — Consult Note (Addendum)
ANTICOAGULATION CONSULT NOTE   Pharmacy Consult for heparin Indication: Portal vein thrombosis  Allergies  Allergen Reactions  . Tetanus Toxoids Shortness Of Breath and Rash  . Shellfish Allergy     Patient Measurements: Height: 5\' 4"  (162.6 cm) Weight: 75.8 kg (167 lb) IBW/kg (Calculated) : 54.7 Heparin Dosing Weight: 70.6 kg  Vital Signs: Temp: 97.8 F (36.6 C) (03/10 1024) Temp Source: Oral (03/10 1024) BP: 136/81 (03/10 1318) Pulse Rate: 86 (03/10 1318)  Labs: Recent Labs    10/01/20 0000 10/03/20 1028  HGB 9.8* 10.0*  HCT 30.8* 31.5*  PLT 249 225  CREATININE  --  0.71    Estimated Creatinine Clearance: 61.5 mL/min (by C-G formula based on SCr of 0.71 mg/dL).   Medical History: Past Medical History:  Diagnosis Date  . Anxiety   . Breast cancer ([place])    pT2 pN0 cM0 (stage IIA) invasive mammary carcinoma of left outer breast status post lumpectomy and sentinel node study on July 14, 2010  . CAD (coronary artery disease)   . Depression   . H/O hypokalemia   . History of chemotherapy   . History of MI (myocardial infarction)   . Hyperglycemia   . Hyperlipidemia   . Hypertension   . Peripheral neuropathy due to chemotherapy ([place])   . Personal history of chemotherapy 2011   left breast ca  . Personal history of radiation therapy 2001   left breast ca    Medications:  Xarelto 20 mg was changed to Lovenox 40 mg SQ daily on 10/01/20  Assessment: 75 y.o. female with past medical history of hypertension, hyperlipidemia, breast cancer recently diagnosed portal vein thrombosis on anticoagulation, here with altered mental status, nausea, abdominal pain. Hgb: 10 and plts: 225 Heparin Dosing Weight: 70.6 kg Last dose of Lovenox was 10/03/20 @ 0830  Goal of Therapy:  Heparin level 0.3-0.7 units/ml  aPTT 66-102 Monitor platelets by anticoagulation protocol: Yes   Plan:  Give 4000 units bolus x 1 Start heparin infusion at 1200 units/hr  Check anti-Xa  level in 8 hours Continue to monitor H&H and platelets   Darnelle Bos, PharmD 10/03/2020,1:40 PM

## 2020-10-03 NOTE — ED Notes (Signed)
Patient transported to CT 

## 2020-10-03 NOTE — ED Notes (Signed)
Lab in room to redraw PTT and draw blood cultures

## 2020-10-03 NOTE — Assessment & Plan Note (Addendum)
#  75 year old female patient with remote history of breast cancer; and history of portal vein thrombosis unclear etiology-on Eliquis is currently in the hospital for worsening abdominal pain.  #Worsening abdominal pain-imaging findings highly concerning for malignancy-worsening liver lesions; soft tissue fullness in the pancreatic bed; peritoneal nodules; soft tissue nodule right lower xiphoid process.  Patient awaiting biopsy morning.  IV heparin on hold.  Discussed with IR.  #History of left breast cancer stage II ER/PR positive HER-2 negative [2011] currently on surveillance/not on active therapy.  However incidentally mass noted abdomen pelvis CT scan-left breast-question recurrence.  Await above biopsy results.  #Progressive portal vein thrombosis unclear etiology-?  Underlying malignancy.  Recommend Lovenox 1 mg/kg bolus twice daily  #Discuss above plan with patient.  Also discussed with patient's daughter Lattie Haw.  We will plan for appointment at the cancer center in approximately 1 week.  The daughter is in agreement.

## 2020-10-04 ENCOUNTER — Telehealth: Payer: Self-pay | Admitting: Internal Medicine

## 2020-10-04 ENCOUNTER — Observation Stay: Payer: Medicare PPO

## 2020-10-04 ENCOUNTER — Telehealth: Payer: Medicare PPO

## 2020-10-04 DIAGNOSIS — I81 Portal vein thrombosis: Secondary | ICD-10-CM

## 2020-10-04 DIAGNOSIS — J90 Pleural effusion, not elsewhere classified: Secondary | ICD-10-CM | POA: Diagnosis not present

## 2020-10-04 DIAGNOSIS — C7989 Secondary malignant neoplasm of other specified sites: Secondary | ICD-10-CM | POA: Diagnosis not present

## 2020-10-04 DIAGNOSIS — C50812 Malignant neoplasm of overlapping sites of left female breast: Secondary | ICD-10-CM | POA: Diagnosis not present

## 2020-10-04 DIAGNOSIS — K769 Liver disease, unspecified: Secondary | ICD-10-CM | POA: Diagnosis not present

## 2020-10-04 DIAGNOSIS — Z170 Estrogen receptor positive status [ER+]: Secondary | ICD-10-CM | POA: Diagnosis not present

## 2020-10-04 DIAGNOSIS — Z853 Personal history of malignant neoplasm of breast: Secondary | ICD-10-CM | POA: Diagnosis not present

## 2020-10-04 DIAGNOSIS — R1011 Right upper quadrant pain: Secondary | ICD-10-CM | POA: Diagnosis not present

## 2020-10-04 DIAGNOSIS — L989 Disorder of the skin and subcutaneous tissue, unspecified: Secondary | ICD-10-CM | POA: Diagnosis not present

## 2020-10-04 DIAGNOSIS — R222 Localized swelling, mass and lump, trunk: Secondary | ICD-10-CM | POA: Diagnosis not present

## 2020-10-04 DIAGNOSIS — J9811 Atelectasis: Secondary | ICD-10-CM | POA: Diagnosis not present

## 2020-10-04 DIAGNOSIS — R109 Unspecified abdominal pain: Secondary | ICD-10-CM | POA: Diagnosis not present

## 2020-10-04 LAB — BASIC METABOLIC PANEL
Anion gap: 9 (ref 5–15)
BUN: 19 mg/dL (ref 8–23)
CO2: 20 mmol/L — ABNORMAL LOW (ref 22–32)
Calcium: 9.4 mg/dL (ref 8.9–10.3)
Chloride: 109 mmol/L (ref 98–111)
Creatinine, Ser: 0.64 mg/dL (ref 0.44–1.00)
GFR, Estimated: >60 mL/min (ref >60)
Glucose, Bld: 99 mg/dL (ref 70–99)
Potassium: 4.2 mmol/L (ref 3.5–5.1)
Sodium: 138 mmol/L (ref 135–145)

## 2020-10-04 LAB — CBC
HCT: 29.3 % — ABNORMAL LOW (ref 36.0–46.0)
Hemoglobin: 9.6 g/dL — ABNORMAL LOW (ref 12.0–15.0)
MCH: 26.8 pg (ref 26.0–34.0)
MCHC: 32.8 g/dL (ref 30.0–36.0)
MCV: 81.8 fL (ref 80.0–100.0)
Platelets: 200 10*3/uL (ref 150–400)
RBC: 3.58 MIL/uL — ABNORMAL LOW (ref 3.87–5.11)
RDW: 17.3 % — ABNORMAL HIGH (ref 11.5–15.5)
WBC: 11.7 10*3/uL — ABNORMAL HIGH (ref 4.0–10.5)
nRBC: 0 % (ref 0.0–0.2)

## 2020-10-04 LAB — LACTATE DEHYDROGENASE: LDH: 197 U/L — ABNORMAL HIGH (ref 98–192)

## 2020-10-04 MED ORDER — ENOXAPARIN SODIUM 40 MG/0.4ML ~~LOC~~ SOLN: 75.0000 mg | Freq: Two times a day (BID) | SUBCUTANEOUS | 0 refills | Status: DC

## 2020-10-04 MED ORDER — FENTANYL CITRATE (PF) 100 MCG/2ML IJ SOLN
INTRAMUSCULAR | Status: AC | PRN
  Administered 2020-10-04 (×2): 50 ug via INTRAVENOUS

## 2020-10-04 MED ORDER — MIDAZOLAM HCL 2 MG/2ML IJ SOLN
INTRAMUSCULAR | Status: AC | PRN
Start: 2020-10-04 — End: 2020-10-04
  Administered 2020-10-04 (×2): 1 mg via INTRAVENOUS

## 2020-10-04 MED ORDER — MIDAZOLAM HCL 2 MG/2ML IJ SOLN
INTRAMUSCULAR | Status: AC
  Filled 2020-10-04: qty 2

## 2020-10-04 MED ORDER — OXYCODONE-ACETAMINOPHEN 10-325 MG PO TABS: 1.0000 | ORAL_TABLET | Freq: Four times a day (QID) | ORAL | 0 refills | Status: DC | PRN

## 2020-10-04 MED ORDER — ONDANSETRON HCL 4 MG PO TABS: 4.0000 mg | ORAL_TABLET | Freq: Every day | ORAL | 0 refills | Status: DC | PRN

## 2020-10-04 MED ORDER — FENTANYL CITRATE (PF) 100 MCG/2ML IJ SOLN
INTRAMUSCULAR | Status: AC
  Filled 2020-10-04: qty 2

## 2020-10-04 MED ORDER — LACTULOSE 10 GM/15ML PO SOLN
20.0000 g | Freq: Once | ORAL | Status: AC
  Administered 2020-10-04: 20 g via ORAL
  Filled 2020-10-04: qty 30

## 2020-10-04 MED ORDER — LACTULOSE 20 GM/30ML PO SOLN: 20.0000 g | Freq: Every day | ORAL | 0 refills | Status: AC | PRN

## 2020-10-04 MED ORDER — POLYETHYLENE GLYCOL 3350 17 G PO PACK: 17.0000 g | PACK | Freq: Two times a day (BID) | ORAL | 0 refills | Status: DC | PRN

## 2020-10-04 NOTE — Discharge Summary (Addendum)
Physician Discharge Summary  Patient ID: Theresa Andrews MRN: [id number] DOB/AGE: 74-Oct-1947 74 y.o.  Admit date: 10/03/2020 Discharge date: 10/04/2020  Admission Diagnoses:  Discharge Diagnoses:  Principal Problem:   Abdominal pain Active Problems:   Anxiety disorder   Breast CA ([place])   Hyperlipidemia   At risk for falling   Carcinoma of overlapping sites of left breast in female, estrogen receptor positive ([place])   Benign essential HTN   Obesity (BMI 30-39.9)   Statin myopathy   Lesion of liver   Discharged Condition: good  Hospital Course:  Theresa Andrews is a 75 y.o. female with medical history significant for left sided breast cancer ten years ago s/p lumpectomy, radiaion and chemo, obesity, hyperparathyroidism, hypercalcemia, anxiety, hyperlipidemia, hypertension, presented to the emergency department for chief concerns of headache.  She reports the headache is a throbbing sensation along the right forehead, different from any headache she is ever experienced before, and it started in the a.m.  In the emergency department, she reports that her headache has resolved.  Her main concern at this time, is a right upper quadrant nagging pain, 8/10 and persistent.  She reports the pain is worse with laying down and improves with sitting up. The nagging pain started about two weeks ago.  She reports tylenol improves the pain.   CT of the abdomen and pelvis with contrast ordered in the ED read as new or progressive metastatic disease, most definite involving necrotic abdominal nodes and peritoneum.  Developing periumbilical nodularity also likely metastatic.  Progressive liver disease lesions likely indicative of metastatic disease. Developing soft tissue fullness of the pancreatic tail, with considerations of primary adenocarcinoma, metastatic disease, or superimposed acute pancreatitis.  Left breast mass in the setting of prior lumpectomy for carcinoma.  Suspicious of new breast  primary.  News small volume abdominal pelvic ascites.  Patient is consulted by oncology, biopsy of chest wall mass was obtained today, results still pending.  Per oncology, patient can be discharged home tomorrow and follow-up with them for results.  Patient also has some constipation, will give enema and lactulose.  Also prescribed laxative for the patient.  Pain medicine and Zofran was also ordered. Per oncology, patient will need Lovenox 1 mg/kg twice a day.  Consults: hematology/oncology  Significant Diagnostic Studies:  ADDENDUM REPORT: 10/03/2020 14:10  ADDENDUM: ,There is soft tissue fullness along the anterior right lower rib cartilage, including on 14/2. This is new since 02/19/2020, also highly suspicious for metastatic disease.   Electronically Signed   By: Abigail Miyamoto M.D.   On: 10/03/2020 14:10   Addended by Abigail Miyamoto, MD on 10/03/2020 2:12 PM    Study Result  Narrative & Impression  CLINICAL DATA:  Right-sided abdominal pain for about a week with nausea and vomiting x2. Constipation. Remote breast cancer history.  EXAM: CT ABDOMEN AND PELVIS WITH CONTRAST  TECHNIQUE: Multidetector CT imaging of the abdomen and pelvis was performed using the standard protocol following bolus administration of intravenous contrast.  CONTRAST:  110mL OMNIPAQUE IOHEXOL 300 MG/ML  SOLN  COMPARISON:  Multiple priors, including MRCP of 09/26/2020, abdominal CT of 02/19/2020, abdominal MRI 03/07/2020.  FINDINGS: Lower chest: Right base atelectasis. Small right pleural effusion is new since the prior CT. Normal heart size with multivessel coronary artery atherosclerosis.  Left-sided calcification or surgical clips. Deep to the presumed lumpectomy site is a centrally hypoattenuating lesion of 2.8 x 2.2 cm on 05/02. This is likely not imaged on the prior CT, but present on  the 03/07/2020 MRI where it was grossly similar.  Hepatobiliary: Multiple hepatic masses  are again identified. A high right hepatic lobe lesion measures 2.6 cm on 13/2 versus 1.8 cm on 02/19/2020.  A wedge-shaped mass within segments 4 and 2 of the liver measures on the order of 10.5 x 5.1 cm on 19/2. Significantly enlarged from 02/19/2020, where lesions in this region measured up to 3.0 cm. Increased from on the order of 3.3 cm on 03/07/2020.  Cholecystectomy without biliary duct dilatation.  Pancreas: The pancreas demonstrates similar atrophy with moderate duct dilatation at 9 mm on 29/2. Duct dilatation is followed to the level of the pancreatic head, with there is mild soft tissue fullness but no well-defined mass. Example 34/2. This is unchanged. The pancreatic tail demonstrates developing relative soft tissue fullness compared to the pancreatic body. Example at on the order of 2.3 cm in thickness on 25/2 versus 1.4 cm on 02/19/2020.  Spleen: Normal in size, without focal abnormality.  Adrenals/Urinary Tract: Normal adrenal glands. Upper pole left renal lesion of 1.6 cm is greater than fluid density but was determined a complex cyst on prior MRI. Normal right kidney. Contrast in the urinary bladder secondary to reported outside CT performed yesterday.  Stomach/Bowel: Tiny hiatal hernia. Normal distal stomach. Moderate amount of stool within the rectum. Scattered colonic diverticula. Normal terminal ileum. Normal small bowel caliber.  Vascular/Lymphatic: Aortic atherosclerosis. Diffuse portal vein thrombosis again identified. Thrombus involves the superior aspect of the superior mesenteric vein, chronic.  New and progressive abdominal adenopathy. An aortocaval node measures 1.0 cm on 39/2 versus 7 mm on 02/19/2020 (when remeasured).  A necrotic node along the celiac, just posterior to the pancreatic body measures 1.9 cm on 30/2 and is new. Developing adenopathy in the gastrohepatic ligament including at 1.0 cm on 24/2.  More ill-defined  hypoattenuating lesions in the peripancreatic space, including at 2.6 cm in the portacaval space on 29/2, increased from 2.3 cm on 02/19/2020.  No pelvic sidewall adenopathy.  Reproductive: Hysterectomy.  No adnexal mass.  Other: Since 02/19/2020, development of small volume abdominopelvic ascites. Peritoneal nodularity, including within the pelvic cul-de-sac on 78/2 and along the right-side of the abdomen on 39/2. No free intraperitoneal air. A new periumbilical nodule or node measures 1.9 cm on 57/2.  Musculoskeletal: No acute osseous abnormality. Trace L4-5 anterolisthesis. Degenerate disc disease at the lumbosacral junction.  IMPRESSION: 1. When compared to multiple prior exams, including most direct comparison to the 02/19/2020 CT, new or progressive metastatic disease, most definite involving necrotic abdominal nodes and the peritoneum as detailed above. Developing periumbilical nodularity, also likely metastatic. 2. Progressive liver lesions, also most likely indicative of metastatic disease. In the setting of portal vein thrombosis, progressive abscesses are possible but less likely. 3. Abnormal appearance of the pancreas, at least partially felt to be attributed to chronic pancreatitis. Developing soft tissue fullness within the pancreatic tail, with considerations of primary adenocarcinoma, metastatic disease, or superimposed acute pancreatitis. Peripancreatic hypoattenuating lesions, some of which are increased compared to 02/19/2020. These could be secondary to prior pancreatitis or also represent necrotic nodal metastasis. 4. Left breast mass in the setting of prior lumpectomy for carcinoma. Suspicious for new breast primary, incompletely imaged. 5.  Possible constipation. 6. New small volume abdominopelvic ascites. 7. Similar small right pleural effusion compared to the MRI of 09/26/2020.  Electronically Signed: By: Abigail Miyamoto M.D. On: 10/03/2020  12:32        Treatments: Biopsy  Discharge Exam: Blood pressure 117/68,  pulse 91, temperature 99.2 F (37.3 C), temperature source Oral, resp. rate 20, height 5\' 4"  (1.626 m), weight 75.8 kg, SpO2 94 %. General appearance: alert and cooperative Resp: clear to auscultation bilaterally Cardio: regular rate and rhythm, S1, S2 normal, no murmur, click, rub or gallop GI: soft mild tendeness, no rebound, normal bowel sounds Extremities: edema trace  Disposition: Discharge disposition: 01-Home or Self Care       Discharge Instructions    Diet - low sodium heart healthy   Complete by: As directed    Discharge wound care:   Complete by: As directed    Keep clean   Increase activity slowly   Complete by: As directed      Allergies as of 10/04/2020      Reactions   Tetanus Toxoids Shortness Of Breath, Rash   Shellfish Allergy       Medication List    TAKE these medications   amLODipine 10 MG tablet Commonly known as: NORVASC Take 1 tablet (10 mg total) by mouth daily.   cholecalciferol 1000 units tablet Commonly known as: VITAMIN D Take 1,000 Units by mouth daily.   enoxaparin 40 MG/0.4ML injection Commonly known as: LOVENOX Inject 0.4 mLs (40 mg total) into the skin daily.   Lactulose 20 GM/30ML Soln Take 30 mLs (20 g total) by mouth daily as needed.   metoprolol succinate 25 MG 24 hr tablet Commonly known as: TOPROL-XL Take 0.5 tablets (12.5 mg total) by mouth daily.   ondansetron 4 MG tablet Commonly known as: Zofran Take 1 tablet (4 mg total) by mouth daily as needed for nausea or vomiting.   oxyCODONE-acetaminophen 10-325 MG tablet Commonly known as: Percocet Take 1 tablet by mouth every 6 (six) hours as needed for pain.   PARoxetine 10 MG tablet Commonly known as: PAXIL Take 1 tablet (10 mg total) by mouth daily.   polyethylene glycol 17 g packet Commonly known as: MIRALAX / GLYCOLAX Take 17 g by mouth 2 (two) times daily as needed.    potassium chloride SA 20 MEQ tablet Commonly known as: KLOR-CON 1 pill twice a day   rosuvastatin 10 MG tablet Commonly known as: CRESTOR Take 1 tablet (10 mg total) by mouth daily.   telmisartan 80 MG tablet Commonly known as: MICARDIS One daily   tretinoin 0.025 % cream Commonly known as: RETIN-A Apply topically at bedtime as needed and may repeat dose one time if needed.            Discharge Care Instructions  (From admission, onward)         Start     Ordered   10/04/20 0000  Discharge wound care:       Comments: Keep clean   10/04/20 1242          Follow-up Information    Steele Sizer, MD Follow up in 1 week(s).   Specialty: Family Medicine Contact information: [address] [address] [phone]        Lloyd Huger, MD Follow up in 1 week(s).   Specialty: Oncology Contact information: [place] [address] [phone]               Signed: Sharen Hones 10/04/2020, 12:42 PM

## 2020-10-04 NOTE — Progress Notes (Signed)
Patient's granddaughter given instructions to keep all follow up appointments, when to return for worsening symptoms, IV and tele discontinued, Lovenox education given, & patient discharged via wheelchair. Laxatives and enema given.

## 2020-10-04 NOTE — Progress Notes (Signed)
Tumor Board Documentation  Theresa Andrews was presented by Dr Rogue Bussing at our Tumor Board on 10/03/2020, which included representatives from medical oncology,radiation oncology,internal medicine,navigation,pathology,radiology,surgical,pharmacy,research,palliative care.  [name] currently presents as a current patient,for discussion,for progression with history of the following treatments: active survellience.  Additionally, we reviewed previous medical and familial history, history of present illness, and recent lab results along with all available histopathologic and imaging studies. The tumor board considered available treatment options and made the following recommendations: Biopsy (Ct Guided Biopsy Right side of Sternum below the Xyphoid Process in the Cartilage)    The following procedures/referrals were also placed: No orders of the defined types were placed in this encounter.   Clinical Trial Status: not discussed   Staging used: To be determined  National site-specific guidelines   were discussed with respect to the case.  Tumor board is a meeting of clinicians from various specialty areas who evaluate and discuss patients for whom a multidisciplinary approach is being considered. Final determinations in the plan of care are those of the provider(s). The responsibility for follow up of recommendations given during tumor board is that of the provider.   Today's extended care, comprehensive team conference, [name] was not present for the discussion and was not examined.   Multidisciplinary Tumor Board is a multidisciplinary case peer review process.  Decisions discussed in the Multidisciplinary Tumor Board reflect the opinions of the specialists present at the conference without having examined the patient.  Ultimately, treatment and diagnostic decisions rest with the primary provider(s) and the patient.

## 2020-10-04 NOTE — Consult Note (Signed)
Chief Complaint: Patient was seen in consultation today for  Chief Complaint  Patient presents with  . Abdominal Pain    Referring Physician(s): Dr. Rogue Bussing  Supervising Physician: Mir, Sharen Heck  Patient Status: [place] - In-pt  History of Present Illness: Theresa Andrews is a 75 y.o. female with a medical history significant for left breast cancer (2011, s/p lumpectomy and chemotherapy), HTN, and MI. She is known to IR from a liver lesion biopsy 03/14/20 with pathology negative for malignancy. She presented to the [facility] ED on 10/03/20 with complaints of altered mental status, nausea and abdominal pain.    CT Abdomen/Pelvis with contrast 10/03/20 IMPRESSION: 1. When compared to multiple prior exams, including most direct comparison to the 02/19/2020 CT, new or progressive metastatic disease, most definite involving necrotic abdominal nodes and the peritoneum as detailed above. Developing periumbilical nodularity, also likely metastatic. 2. Progressive liver lesions, also most likely indicative of metastatic disease. In the setting of portal vein thrombosis, progressive abscesses are possible but less likely. 3. Abnormal appearance of the pancreas, at least partially felt to be attributed to chronic pancreatitis. Developing soft tissue fullness within the pancreatic tail, with considerations of primary adenocarcinoma, metastatic disease, or superimposed acute pancreatitis. Peripancreatic hypoattenuating lesions, some of which are increased compared to 02/19/2020. These could be secondary to prior pancreatitis or also represent necrotic nodal metastasis. 4. Left breast mass in the setting of prior lumpectomy for carcinoma. Suspicious for new breast primary, incompletely imaged. 5.  Possible constipation. 6. New small volume abdominopelvic ascites. 7. Similar small right pleural effusion compared to the MRI of 09/26/2020. ADDENDUM: There is soft tissue fullness along the  anterior right lower rib cartilage, including on 14/2. This is new since 02/19/2020, also highly suspicious for metastatic disease.  Interventional Radiology has been asked to evaluate this patient for an image-guided abdominal lesion biopsy. Imaging has been reviewed and procedure approved by Dr. Dwaine Gale.    Past Medical History:  Diagnosis Date  . Anxiety   . Breast cancer ([place])    pT2 pN0 cM0 (stage IIA) invasive mammary carcinoma of left outer breast status post lumpectomy and sentinel node study on July 14, 2010  . CAD (coronary artery disease)   . Depression   . H/O hypokalemia   . History of chemotherapy   . History of MI (myocardial infarction)   . Hyperglycemia   . Hyperlipidemia   . Hypertension   . Peripheral neuropathy due to chemotherapy ([place])   . Personal history of chemotherapy 2011   left breast ca  . Personal history of radiation therapy 2001   left breast ca    Past Surgical History:  Procedure Laterality Date  . ABDOMINAL HYSTERECTOMY    . BREAST BIOPSY Left 2008   neg  . BREAST BIOPSY Left 03/05/2010   invasive mammary carcinoma  . BREAST LUMPECTOMY Left 07/14/2010   INVASIVE MAMMARY CARCINOMA WITH PROMINENT INFILTRATIVE PATTERN, negative margins  . CHOLECYSTECTOMY    . COLONOSCOPY WITH PROPOFOL N/A 11/22/2015   Procedure: COLONOSCOPY WITH PROPOFOL;  Surgeon: Hulen Luster, MD;  Location: [facility] ENDOSCOPY;  Service: Gastroenterology;  Laterality: N/A;  . CORONARY ANGIOPLASTY WITH STENT PLACEMENT  2008    Allergies: Tetanus toxoids and Shellfish allergy  Medications: Prior to Admission medications   Medication Sig Start Date End Date Taking? Authorizing Provider  amLODipine (NORVASC) 10 MG tablet Take 1 tablet (10 mg total) by mouth daily. 07/29/20  Yes Sowles, Drue Stager, MD  cholecalciferol (VITAMIN D) 1000 units tablet Take  1,000 Units by mouth daily.   Yes [provider]  enoxaparin (LOVENOX) 40 MG/0.4ML injection Inject 0.4 mLs (40 mg total)  into the skin daily. 10/01/20  Yes Sowles, Drue Stager, MD  metoprolol succinate (TOPROL-XL) 25 MG 24 hr tablet Take 0.5 tablets (12.5 mg total) by mouth daily. 07/29/20  Yes Sowles, Drue Stager, MD  PARoxetine (PAXIL) 10 MG tablet Take 1 tablet (10 mg total) by mouth daily. 09/13/20  Yes Sowles, Drue Stager, MD  potassium chloride SA (KLOR-CON) 20 MEQ tablet 1 pill twice a day 08/08/20  Yes Cammie Sickle, MD  rosuvastatin (CRESTOR) 10 MG tablet Take 1 tablet (10 mg total) by mouth daily. 09/04/20  Yes Sowles, Drue Stager, MD  telmisartan (MICARDIS) 80 MG tablet One daily 07/29/20  Yes Sowles, Drue Stager, MD  tretinoin (RETIN-A) 0.025 % cream Apply topically at bedtime as needed and may repeat dose one time if needed. 10/23/17  Yes [provider]     Family History  Problem Relation Age of Onset  . Breast cancer Sister 72  . Hypertension Mother   . Aneurysm Mother   . Heart attack Sister   . Birth defects Sister     Social History   Socioeconomic History  . Marital status: Divorced    Spouse name: Not on file  . Number of children: 3  . Years of education: some college, no degree  . Highest education level: 12th grade  Occupational History    Employer: RETIRED  Tobacco Use  . Smoking status: Former Smoker    Packs/day: 0.25    Years: 25.00    Pack years: 6.25    Types: Cigarettes    Quit date: 2008    Years since quitting: 14.2  . Smokeless tobacco: Never Used  . Tobacco comment: smoking cessation materials not required  Vaping Use  . Vaping Use: Never used  Substance and Sexual Activity  . Alcohol use: No    Comment: rare; holidays  . Drug use: No  . Sexual activity: Not Currently  Other Topics Concern  . Not on file  Social History Narrative   Pt lives alone   Social Determinants of Health   Financial Resource Strain: Low Risk   . Difficulty of Paying Living Expenses: Not hard at all  Food Insecurity: No Food Insecurity  . Worried About Charity fundraiser in the Last  Year: Never true  . Ran Out of Food in the Last Year: Never true  Transportation Needs: No Transportation Needs  . Lack of Transportation (Medical): No  . Lack of Transportation (Non-Medical): No  Physical Activity: Inactive  . Days of Exercise per Week: 0 days  . Minutes of Exercise per Session: 0 min  Stress: No Stress Concern Present  . Feeling of Stress : Not at all  Social Connections: Socially Isolated  . Frequency of Communication with Friends and Family: More than three times a week  . Frequency of Social Gatherings with Friends and Family: Three times a week  . Attends Religious Services: Never  . Active Member of Clubs or Organizations: No  . Attends Archivist Meetings: Never  . Marital Status: Divorced    Review of Systems: A 12 point ROS discussed and pertinent positives are indicated in the HPI above.  All other systems are negative.  Review of Systems  Constitutional: Negative for appetite change and fatigue.  Respiratory: Negative for cough and shortness of breath.   Cardiovascular: Positive for leg swelling. Negative for chest pain.  Gastrointestinal: Positive for abdominal pain. Negative for diarrhea, nausea and vomiting.  Musculoskeletal: Negative for back pain.  Neurological: Negative for headaches.    Vital Signs: BP 138/60 (BP Location: Right Arm)   Pulse 95   Temp 98 F (36.7 C) (Oral)   Resp 18   Ht 5\' 4"  (1.626 m)   Wt 167 lb (75.8 kg)   SpO2 99%   BMI 28.67 kg/m   Physical Exam Constitutional:      General: She is not in acute distress. HENT:     Mouth/Throat:     Mouth: Mucous membranes are moist.     Pharynx: Oropharynx is clear.  Cardiovascular:     Rate and Rhythm: Normal rate and regular rhythm.     Pulses: Normal pulses.     Heart sounds: Normal heart sounds.  Pulmonary:     Effort: Pulmonary effort is normal.     Breath sounds: Normal breath sounds.  Abdominal:     General: Bowel sounds are normal.     Palpations:  Abdomen is soft.     Tenderness: There is abdominal tenderness.     Comments: Upper abdominal tenderness  Musculoskeletal:        General: Normal range of motion.     Right lower leg: Edema present.     Left lower leg: Edema present.  Skin:    General: Skin is warm and dry.  Neurological:     Mental Status: She is alert and oriented to person, place, and time.     Imaging: CT Head Wo Contrast  Result Date: 10/03/2020 CLINICAL DATA:  Headache, intracranial hemorrhage suspected EXAM: CT HEAD WITHOUT CONTRAST TECHNIQUE: Contiguous axial images were obtained from the base of the skull through the vertex without intravenous contrast. COMPARISON:  10/01/2020 FINDINGS: Brain: No evidence of acute infarction, hemorrhage, hydrocephalus, extra-axial collection or mass lesion/mass effect. Mild periventricular and deep white matter hypodensity. Vascular: No hyperdense vessel or unexpected calcification. Skull: Normal. Negative for fracture or focal lesion. Sinuses/Orbits: No acute finding. Other: None. IMPRESSION: 1. No acute intracranial pathology. No non-contrast CT findings to explain headache. Specifically, no evidence of intracranial hemorrhage. 2.  Small-vessel white matter disease. Electronically Signed   By: Eddie Candle M.D.   On: 10/03/2020 12:06   CT Head Wo Contrast  Result Date: 10/01/2020 CLINICAL DATA:  Altered mental status, unsteady gait EXAM: CT HEAD WITHOUT CONTRAST TECHNIQUE: Contiguous axial images were obtained from the base of the skull through the vertex without intravenous contrast. COMPARISON:  None. FINDINGS: Brain: No evidence of acute infarction, hemorrhage, hydrocephalus, extra-axial collection or mass lesion/mass effect. Scattered low-density changes within the periventricular and subcortical white matter compatible with chronic microvascular ischemic change. Mild diffuse cerebral volume loss. Vascular: Atherosclerotic calcifications involving the large vessels of the skull  base. No unexpected hyperdense vessel. Skull: Normal. Negative for fracture or focal lesion. Sinuses/Orbits: No acute finding. Other: None. IMPRESSION: 1. No acute intracranial findings. 2. Mild chronic microvascular ischemic change and cerebral volume loss. Electronically Signed   By: Davina Poke D.O.   On: 10/01/2020 16:28   CT ABDOMEN PELVIS W CONTRAST  Addendum Date: 10/03/2020   ADDENDUM REPORT: 10/03/2020 14:10 ADDENDUM: ,There is soft tissue fullness along the anterior right lower rib cartilage, including on 14/2. This is new since 02/19/2020, also highly suspicious for metastatic disease. Electronically Signed   By: Abigail Miyamoto M.D.   On: 10/03/2020 14:10   Result Date: 10/03/2020 CLINICAL DATA:  Right-sided abdominal pain for about a  week with nausea and vomiting x2. Constipation. Remote breast cancer history. EXAM: CT ABDOMEN AND PELVIS WITH CONTRAST TECHNIQUE: Multidetector CT imaging of the abdomen and pelvis was performed using the standard protocol following bolus administration of intravenous contrast. CONTRAST:  170mL OMNIPAQUE IOHEXOL 300 MG/ML  SOLN COMPARISON:  Multiple priors, including MRCP of 09/26/2020, abdominal CT of 02/19/2020, abdominal MRI 03/07/2020. FINDINGS: Lower chest: Right base atelectasis. Small right pleural effusion is new since the prior CT. Normal heart size with multivessel coronary artery atherosclerosis. Left-sided calcification or surgical clips. Deep to the presumed lumpectomy site is a centrally hypoattenuating lesion of 2.8 x 2.2 cm on 05/02. This is likely not imaged on the prior CT, but present on the 03/07/2020 MRI where it was grossly similar. Hepatobiliary: Multiple hepatic masses are again identified. A high right hepatic lobe lesion measures 2.6 cm on 13/2 versus 1.8 cm on 02/19/2020. A wedge-shaped mass within segments 4 and 2 of the liver measures on the order of 10.5 x 5.1 cm on 19/2. Significantly enlarged from 02/19/2020, where lesions in this  region measured up to 3.0 cm. Increased from on the order of 3.3 cm on 03/07/2020. Cholecystectomy without biliary duct dilatation. Pancreas: The pancreas demonstrates similar atrophy with moderate duct dilatation at 9 mm on 29/2. Duct dilatation is followed to the level of the pancreatic head, with there is mild soft tissue fullness but no well-defined mass. Example 34/2. This is unchanged. The pancreatic tail demonstrates developing relative soft tissue fullness compared to the pancreatic body. Example at on the order of 2.3 cm in thickness on 25/2 versus 1.4 cm on 02/19/2020. Spleen: Normal in size, without focal abnormality. Adrenals/Urinary Tract: Normal adrenal glands. Upper pole left renal lesion of 1.6 cm is greater than fluid density but was determined a complex cyst on prior MRI. Normal right kidney. Contrast in the urinary bladder secondary to reported outside CT performed yesterday. Stomach/Bowel: Tiny hiatal hernia. Normal distal stomach. Moderate amount of stool within the rectum. Scattered colonic diverticula. Normal terminal ileum. Normal small bowel caliber. Vascular/Lymphatic: Aortic atherosclerosis. Diffuse portal vein thrombosis again identified. Thrombus involves the superior aspect of the superior mesenteric vein, chronic. New and progressive abdominal adenopathy. An aortocaval node measures 1.0 cm on 39/2 versus 7 mm on 02/19/2020 (when remeasured). A necrotic node along the celiac, just posterior to the pancreatic body measures 1.9 cm on 30/2 and is new. Developing adenopathy in the gastrohepatic ligament including at 1.0 cm on 24/2. More ill-defined hypoattenuating lesions in the peripancreatic space, including at 2.6 cm in the portacaval space on 29/2, increased from 2.3 cm on 02/19/2020. No pelvic sidewall adenopathy. Reproductive: Hysterectomy.  No adnexal mass. Other: Since 02/19/2020, development of small volume abdominopelvic ascites. Peritoneal nodularity, including within the pelvic  cul-de-sac on 78/2 and along the right-side of the abdomen on 39/2. No free intraperitoneal air. A new periumbilical nodule or node measures 1.9 cm on 57/2. Musculoskeletal: No acute osseous abnormality. Trace L4-5 anterolisthesis. Degenerate disc disease at the lumbosacral junction. IMPRESSION: 1. When compared to multiple prior exams, including most direct comparison to the 02/19/2020 CT, new or progressive metastatic disease, most definite involving necrotic abdominal nodes and the peritoneum as detailed above. Developing periumbilical nodularity, also likely metastatic. 2. Progressive liver lesions, also most likely indicative of metastatic disease. In the setting of portal vein thrombosis, progressive abscesses are possible but less likely. 3. Abnormal appearance of the pancreas, at least partially felt to be attributed to chronic pancreatitis. Developing soft tissue fullness within  the pancreatic tail, with considerations of primary adenocarcinoma, metastatic disease, or superimposed acute pancreatitis. Peripancreatic hypoattenuating lesions, some of which are increased compared to 02/19/2020. These could be secondary to prior pancreatitis or also represent necrotic nodal metastasis. 4. Left breast mass in the setting of prior lumpectomy for carcinoma. Suspicious for new breast primary, incompletely imaged. 5.  Possible constipation. 6. New small volume abdominopelvic ascites. 7. Similar small right pleural effusion compared to the MRI of 09/26/2020. Electronically Signed: By: Abigail Miyamoto M.D. On: 10/03/2020 12:32   MR ABDOMEN MRCP WO CONTRAST  Result Date: 09/27/2020 CLINICAL DATA:  Pancreatitis. Pancreatic and liver lesions on prior MRI. Abdominal pain. EXAM: MRI ABDOMEN WITHOUT CONTRAST  (INCLUDING MRCP) TECHNIQUE: Multiplanar multisequence MR imaging of the abdomen was performed. Heavily T2-weighted images of the biliary and pancreatic ducts were obtained, and three-dimensional MRCP images were  rendered by post processing. COMPARISON:  03/07/2020 MRI FINDINGS: Despite efforts by the technologist and patient, severe motion artifact is present on today's exam and could not be eliminated. This reduces exam sensitivity and specificity. Due to pain, the patient terminated the exam prior to obtaining full diagnostic sequences. Lower chest: Small right pleural effusion is new compared to prior MRI. Hepatobiliary: There is been evolution of the previous regions of stippled T2 signal hyperintensity scattered in the liver to current appearance of hazy accentuated T2 signal for example on image 9 of series 4. On biopsy, these were felt to represent areas of periductal fibrosis, chronic lymphoplasmacytic infiltrate, hemosiderin deposition, and regenerative hyperplasia suggesting finding secondary to portal vein thrombosis. This process is significantly expanded throughout segment 4a and 4B of the liver which not demonstrates diffuse hazy accentuated T2 signal as shown on image 14 series 4. There is associated reduced T1 signal in these regions of involvement. No steatosis. There is some associated restriction of diffusion in these regions of involvement. Unfortunately the MRCP images are severely blurred. No obvious extrahepatic biliary dilatation. There is some mild narrowing of the common hepatic duct along the porta hepatis for example on image 16 series 3, probably due to surrounding soft tissue swelling and possibly periportal fibrosis. As before, there is high T2 signal intensity material throughout the somewhat dilated intrahepatic portal venous system probably from thrombosis. Periportal edema noted. Pancreas: Peripancreatic edema noted with stippled accentuated T2 signal in the pancreas, and substantially dilated segments of the dorsal pancreatic duct especially in the pancreatic body, up to 1.0 cm in diameter. T2 signal hyperintensity partially involving the pancreatic head measuring about 4.5 by 1.6 by 2.6  cm, extending cephalad along the porta hepatis, I am uncertain whether this represents a thrombosed varix from the portal vein, pseudocyst, or a cystic pancreatic neoplasm. This appears roughly similar to the prior exam. Spleen:  Unremarkable Adrenals/Urinary Tract: Small renal lesions of varying complexity are present and probably a combination of simple and complex cysts, although enhancement characteristics are not interrogated today. The adrenal glands appear normal. Stomach/Bowel: Unremarkable Vascular/Lymphatic: Aortoiliac atherosclerotic vascular disease. 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 Collaterals along the porta hepatis. Other: Ascites particularly along the paracolic gutters and in the pelvis (pelvic ascites seen on coronal images). Mesenteric edema mildly improved in the central mesentery compared to previous. Omental edema noted. There is hazy stranding in the root of the mesentery as on prior exams. Musculoskeletal: Lower lumbar spondylosis and degenerative disc disease. IMPRESSION: 1. Despite efforts by the technologist and  patient, severe motion artifact is present on today's exam and could not be eliminated. The patient was also unable to complete the full sequence of the exam. This reduces exam sensitivity and specificity. 2. Evolution of the appearance in the liver with the previously stippled T2 signal hyperintensities now appearing as hazy increased T2 signal, and with substantially increased involvement throughout segment 4 and 4B of the liver. Based on prior biopsy results this likely represents combination of fibrosis, lymphoplasmacytic infiltrate, hemosiderin deposition, and periportal hyperplasia resulting from portal vein thrombosis. 3. High suspicion for continued portal vein thrombosis, with narrowing of the portal vein along the pancreatic head and probable thrombosis  of the splenic vein. No obvious extrahepatic biliary dilatation. There is some mild narrowing of the common hepatic duct along the porta hepatis, probably due to surrounding soft tissue swelling and possibly periportal fibrosis. 4. Peripancreatic edema with stippled accentuated T2 signal in the pancreas, and substantially dilated segments of the dorsal pancreatic duct especially in the pancreatic body. 5. Lobulated T2 signal hyperintensity along the pancreatic head, possibly a pseudocyst or a thrombosed varix of the portal vein, less likely cystic pancreatic neoplasm. 6. Small right pleural effusion is new compared to previous MRI. 7. Reduced central mesenteric edema compared to previous, although there is still some peripancreatic edema as well as mesenteric edema and ascites. 8. Lower lumbar spondylosis and degenerative disc disease. Electronically Signed   By: Van Clines M.D.   On: 09/27/2020 16:11    Labs:  CBC: Recent Labs    08/21/20 0903 10/01/20 0000 10/03/20 1028 10/04/20 0449  WBC 6.8 12.1* 11.7* 11.7*  HGB 12.2 9.8* 10.0* 9.6*  HCT 38.3 30.8* 31.5* 29.3*  PLT 279 249 225 200    COAGS: Recent Labs    03/14/20 0740 10/03/20 1408  INR 1.1 1.4*  APTT  --  34    BMP: Recent Labs    03/22/20 0945 04/25/20 0852 05/29/20 1344 08/08/20 0000 08/08/20 0958 08/21/20 0903 10/01/20 0000 10/03/20 1028 10/04/20 0449  NA 141 141 142 141 137 136  --  139 138  K 3.2* 2.8* 3.2* 3.5 2.8* 4.5 4.3 4.1 4.2  CL 107 103 106 105 103 103  --  107 109  CO2 25 29 24 27 25 24   --  22 20*  GLUCOSE 125* 139* 115* 129* 149* 150*  --  120* 99  BUN 18 7* 9 9 10 14   --  19 19  CALCIUM 9.7 9.3 10.1 10.3 9.9 10.8*  --  9.4 9.4  CREATININE 0.72 0.66 0.61 0.63 0.66 0.69  --  0.71 0.64  GFRNONAA >60 >60 90 89 >60 >60  --  >60 >60  GFRAA >60 >60 104 103  --   --   --   --   --     LIVER FUNCTION TESTS: Recent Labs    05/29/20 1344 08/08/20 0000 08/08/20 0958 08/21/20 0903  10/03/20 1028  BILITOT 0.4 0.4 0.4 0.5 1.0  AST 27 22 26 20 22   ALT 22 16 19 13 9   ALKPHOS 131*  --  119 123 98  PROT 6.6 7.3 7.8 8.0 6.8  ALBUMIN 3.9  --  3.6 3.6 2.9*    TUMOR MARKERS: No results for input(s): AFPTM, CEA, CA199, CHROMGRNA in the last 8760 hours.  Assessment and Plan:  History of breast cancer with possible metastases: [name], 75 year old female, presents [facility] the [facility] Interventional Radiology department for an image-guided abdominal lesion  biopsy.   Risks and benefits of this procedure were discussed with the patient and/or patient's family including, but not limited to bleeding, infection, damage to adjacent structures or low yield requiring additional tests.  All of the questions were answered and there is agreement to proceed. She has been NPO. Labs and vitals have been reviewed.   Consent signed and in chart.  Thank you for this interesting consult.  I greatly enjoyed meeting NOVI CALIA and look forward to participating in their care.  A copy of this report was sent to the requesting provider on this date.  Electronically Signed: Soyla Dryer, AGACNP-BC [phone] 10/04/2020, 10:43 AM   I spent a total of 20 Minutes    in face to face in clinical consultation, greater than 50% of which was counseling/coordinating care for abdominal mass biopsy.

## 2020-10-04 NOTE — Progress Notes (Signed)
Patient clinically stable post chest wall mass biopsy per DR Mir, tolerated well. Vitals stable pre and post procedure. Report given to Laruth Bouchard on [place]  At bedside , denies complaints at this time. Received Versed 2 mg along with Fentanyl 100 mcg IV for procedure.

## 2020-10-04 NOTE — Telephone Encounter (Signed)
On 3/11- spoke to pt's daughter re: follow up to review the results of Bx.  C- please schedule appt on 3/21- MD; labs- cbc/cmp  Cancel other appt for may/july- Dr.B.

## 2020-10-04 NOTE — Progress Notes (Signed)
Theresa Andrews   DOB:06-06-1946   ZO#:[id number]    Subjective: No acute events.  Currently IV heparin on hold since 2 AM.  Patient awaiting biopsy this morning.  Complains of mild abdominal pain.  Objective:  Vitals:   10/04/20 1355 10/04/20 1509  BP: 136/82 118/74  Pulse: (!) 102 87  Resp: 18 20  Temp: 98.2 F (36.8 C) 97.9 F (36.6 C)  SpO2: 100% 98%     Intake/Output Summary (Last 24 hours) at 10/04/2020 2119 Last data filed at 10/04/2020 1908 Gross per 24 hour  Intake 240 ml  Output 0 ml  Net 240 ml    Physical Exam Constitutional:      Comments: Patient resting in the bed comfortably.  Alone.  HENT:     Head: Normocephalic and atraumatic.     Mouth/Throat:     Pharynx: No oropharyngeal exudate.  Eyes:     Pupils: Pupils are equal, round, and reactive to light.  Cardiovascular:     Rate and Rhythm: Normal rate and regular rhythm.  Pulmonary:     Effort: No respiratory distress.     Breath sounds: No wheezing.     Comments: Decreased breath sounds bilaterally. Abdominal:     General: Bowel sounds are normal. There is no distension.     Palpations: Abdomen is soft. There is no mass.     Tenderness: There is no abdominal tenderness. There is no guarding or rebound.     Comments: Mild abdominal distention.  Mild tenderness.  Musculoskeletal:        General: No tenderness. Normal range of motion.     Cervical back: Normal range of motion and neck supple.  Skin:    General: Skin is warm.  Neurological:     Mental Status: She is alert and oriented to person, place, and time.  Psychiatric:        Mood and Affect: Affect normal.     Labs:  Lab Results  Component Value Date   WBC 11.7 (H) 10/04/2020   HGB 9.6 (L) 10/04/2020   HCT 29.3 (L) 10/04/2020   MCV 81.8 10/04/2020   PLT 200 10/04/2020   NEUTROABS 9,353 (H) 10/01/2020    Lab Results  Component Value Date   NA 138 10/04/2020   K 4.2 10/04/2020   CL 109 10/04/2020   CO2 20 (L) 10/04/2020     Studies:  DG Chest 2 View  Result Date: 10/04/2020 CLINICAL DATA:  Status post right chest wall mass biopsy EXAM: CHEST - 2 VIEW COMPARISON:  Chest radiograph March 26, 2010 FINDINGS: The heart size and mediastinal contours are within normal limits. Calcifications aortic arch. No visible pneumothorax. Right lower lobe airspace opacity. Small right pleural effusion. No visible pneumothorax. Thoracic spondylosis. IMPRESSION: Right lower lobe atelectasis with right pleural effusion. No visible pneumothorax. Electronically Signed   By: Dahlia Bailiff MD   On: 10/04/2020 12:36   CT Head Wo Contrast  Result Date: 10/03/2020 CLINICAL DATA:  Headache, intracranial hemorrhage suspected EXAM: CT HEAD WITHOUT CONTRAST TECHNIQUE: Contiguous axial images were obtained from the base of the skull through the vertex without intravenous contrast. COMPARISON:  10/01/2020 FINDINGS: Brain: No evidence of acute infarction, hemorrhage, hydrocephalus, extra-axial collection or mass lesion/mass effect. Mild periventricular and deep white matter hypodensity. Vascular: No hyperdense vessel or unexpected calcification. Skull: Normal. Negative for fracture or focal lesion. Sinuses/Orbits: No acute finding. Other: None. IMPRESSION: 1. No acute intracranial pathology. No non-contrast CT findings to explain headache. Specifically, no  evidence of intracranial hemorrhage. 2.  Small-vessel white matter disease. Electronically Signed   By: Eddie Candle M.D.   On: 10/03/2020 12:06   CT ABDOMEN PELVIS W CONTRAST  Addendum Date: 10/03/2020   ADDENDUM REPORT: 10/03/2020 14:10 ADDENDUM: ,There is soft tissue fullness along the anterior right lower rib cartilage, including on 14/2. This is new since 02/19/2020, also highly suspicious for metastatic disease. Electronically Signed   By: Abigail Miyamoto M.D.   On: 10/03/2020 14:10   Result Date: 10/03/2020 CLINICAL DATA:  Right-sided abdominal pain for about a week with nausea and vomiting  x2. Constipation. Remote breast cancer history. EXAM: CT ABDOMEN AND PELVIS WITH CONTRAST TECHNIQUE: Multidetector CT imaging of the abdomen and pelvis was performed using the standard protocol following bolus administration of intravenous contrast. CONTRAST:  134mL OMNIPAQUE IOHEXOL 300 MG/ML  SOLN COMPARISON:  Multiple priors, including MRCP of 09/26/2020, abdominal CT of 02/19/2020, abdominal MRI 03/07/2020. FINDINGS: Lower chest: Right base atelectasis. Small right pleural effusion is new since the prior CT. Normal heart size with multivessel coronary artery atherosclerosis. Left-sided calcification or surgical clips. Deep to the presumed lumpectomy site is a centrally hypoattenuating lesion of 2.8 x 2.2 cm on 05/02. This is likely not imaged on the prior CT, but present on the 03/07/2020 MRI where it was grossly similar. Hepatobiliary: Multiple hepatic masses are again identified. A high right hepatic lobe lesion measures 2.6 cm on 13/2 versus 1.8 cm on 02/19/2020. A wedge-shaped mass within segments 4 and 2 of the liver measures on the order of 10.5 x 5.1 cm on 19/2. Significantly enlarged from 02/19/2020, where lesions in this region measured up to 3.0 cm. Increased from on the order of 3.3 cm on 03/07/2020. Cholecystectomy without biliary duct dilatation. Pancreas: The pancreas demonstrates similar atrophy with moderate duct dilatation at 9 mm on 29/2. Duct dilatation is followed to the level of the pancreatic head, with there is mild soft tissue fullness but no well-defined mass. Example 34/2. This is unchanged. The pancreatic tail demonstrates developing relative soft tissue fullness compared to the pancreatic body. Example at on the order of 2.3 cm in thickness on 25/2 versus 1.4 cm on 02/19/2020. Spleen: Normal in size, without focal abnormality. Adrenals/Urinary Tract: Normal adrenal glands. Upper pole left renal lesion of 1.6 cm is greater than fluid density but was determined a complex cyst on prior  MRI. Normal right kidney. Contrast in the urinary bladder secondary to reported outside CT performed yesterday. Stomach/Bowel: Tiny hiatal hernia. Normal distal stomach. Moderate amount of stool within the rectum. Scattered colonic diverticula. Normal terminal ileum. Normal small bowel caliber. Vascular/Lymphatic: Aortic atherosclerosis. Diffuse portal vein thrombosis again identified. Thrombus involves the superior aspect of the superior mesenteric vein, chronic. New and progressive abdominal adenopathy. An aortocaval node measures 1.0 cm on 39/2 versus 7 mm on 02/19/2020 (when remeasured). A necrotic node along the celiac, just posterior to the pancreatic body measures 1.9 cm on 30/2 and is new. Developing adenopathy in the gastrohepatic ligament including at 1.0 cm on 24/2. More ill-defined hypoattenuating lesions in the peripancreatic space, including at 2.6 cm in the portacaval space on 29/2, increased from 2.3 cm on 02/19/2020. No pelvic sidewall adenopathy. Reproductive: Hysterectomy.  No adnexal mass. Other: Since 02/19/2020, development of small volume abdominopelvic ascites. Peritoneal nodularity, including within the pelvic cul-de-sac on 78/2 and along the right-side of the abdomen on 39/2. No free intraperitoneal air. A new periumbilical nodule or node measures 1.9 cm on 57/2. Musculoskeletal: No acute osseous  abnormality. Trace L4-5 anterolisthesis. Degenerate disc disease at the lumbosacral junction. IMPRESSION: 1. When compared to multiple prior exams, including most direct comparison to the 02/19/2020 CT, new or progressive metastatic disease, most definite involving necrotic abdominal nodes and the peritoneum as detailed above. Developing periumbilical nodularity, also likely metastatic. 2. Progressive liver lesions, also most likely indicative of metastatic disease. In the setting of portal vein thrombosis, progressive abscesses are possible but less likely. 3. Abnormal appearance of the pancreas,  at least partially felt to be attributed to chronic pancreatitis. Developing soft tissue fullness within the pancreatic tail, with considerations of primary adenocarcinoma, metastatic disease, or superimposed acute pancreatitis. Peripancreatic hypoattenuating lesions, some of which are increased compared to 02/19/2020. These could be secondary to prior pancreatitis or also represent necrotic nodal metastasis. 4. Left breast mass in the setting of prior lumpectomy for carcinoma. Suspicious for new breast primary, incompletely imaged. 5.  Possible constipation. 6. New small volume abdominopelvic ascites. 7. Similar small right pleural effusion compared to the MRI of 09/26/2020. Electronically Signed: By: Abigail Miyamoto M.D. On: 10/03/2020 12:32   [place] CORE BIOPSY (SOFT TISSUE)  Result Date: 10/04/2020 INDICATION: 75 year old woman with history of breast cancer presented to interventional radiology for biopsy of new right chest wall mass. EXAM: Ultrasound-guided biopsy of right chest wall mass MEDICATIONS: None. ANESTHESIA/SEDATION: Moderate (conscious) sedation was employed during this procedure. A total of Versed 2 mg and Fentanyl 100 mcg was administered intravenously. Moderate Sedation Time: 8 minutes. The patient's level of consciousness and vital signs were monitored continuously by radiology nursing throughout the procedure under my direct supervision. COMPLICATIONS: None immediate. PROCEDURE: Informed written consent was obtained from the patient after a thorough discussion of the procedural risks, benefits and alternatives. All questions were addressed. Maximal Sterile Barrier Technique was utilized including caps, mask, sterile gowns, sterile gloves, sterile drape, hand hygiene and skin antiseptic. A timeout was performed prior to the initiation of the procedure. Patient position supine on the ultrasound table. Right anterior lower chest wall skin prepped and draped in usual sterile fashion. Following local  lidocaine administration, 18 gauge biopsy needle was advanced into the right anterior chest wall mass, and four cores were obtained utilizing continuous ultrasound guidance. Samples were sent to pathology in formalin. Needle removed and hemostasis achieved with 5 minutes of manual compression. Post procedure ultrasound images showed no evidence of significant hemorrhage. IMPRESSION: Ultrasound-guided biopsy of right anterior chest wall mass. Electronically Signed   By: Miachel Roux M.D.   On: 10/04/2020 17:04    Lesion of liver #75 year old female patient with remote history of breast cancer; and history of portal vein thrombosis unclear etiology-on Eliquis is currently in the hospital for worsening abdominal pain.  #Worsening abdominal pain-imaging findings highly concerning for malignancy-worsening liver lesions; soft tissue fullness in the pancreatic bed; peritoneal nodules; soft tissue nodule right lower xiphoid process.  Patient awaiting biopsy morning.  IV heparin on hold.  Discussed with IR.  #History of left breast cancer stage II ER/PR positive HER-2 negative [2011] currently on surveillance/not on active therapy.  However incidentally mass noted abdomen pelvis CT scan-left breast-question recurrence.  Await above biopsy results.  #Progressive portal vein thrombosis unclear etiology-?  Underlying malignancy.  Recommend Lovenox 1 mg/kg bolus twice daily  #Discuss above plan with patient.  Also discussed with patient's daughter Lattie Haw.  We will plan for appointment at the cancer center in approximately 1 week.  The daughter is in agreement.  Cammie Sickle, MD 10/04/2020  9:19 PM

## 2020-10-04 NOTE — Procedures (Signed)
Interventional Radiology Procedure Note  Procedure: Right chest wall mass biopsy  Indication: Metastatic disease  Findings: Please refer to procedural dictation for full description.  Complications: None  EBL: < 10 mL  Miachel Roux, MD [phone]

## 2020-10-04 NOTE — Consult Note (Signed)
Brief Pharmacy Note  Anticoagulation / antiplatelet / inpatient medication list review  Indication: Post-IR R chest wall mass biopsy  Bleeding risk: Standard  Medications:  Therapeutic Lovenox 1 mg/kg BID for portal vein thrombosis as outpatient. IV heparin pre-procedurally.  A/P: Okay to resume Lovenox 6 hours after biopsy  Benita Gutter 10/02/2020,3:52 PM

## 2020-10-05 LAB — CANCER ANTIGEN 15-3: CA 15-3: 78.3 U/mL — ABNORMAL HIGH (ref 0.0–25.0)

## 2020-10-05 LAB — CEA: CEA: 3.8 ng/mL (ref 0.0–4.7)

## 2020-10-05 LAB — CANCER ANTIGEN 27.29: CA 27.29: 110.2 U/mL — ABNORMAL HIGH (ref 0.0–38.6)

## 2020-10-06 ENCOUNTER — Emergency Department: Payer: Medicare PPO

## 2020-10-06 ENCOUNTER — Other Ambulatory Visit: Payer: Self-pay

## 2020-10-06 ENCOUNTER — Emergency Department
Admission: EM | Admit: 2020-10-06 | Discharge: 2020-10-06 | Disposition: A | Discharge status: Home / Self Care | Payer: Medicare PPO | Source: Home / Self Care | Attending: Emergency Medicine | Admitting: Emergency Medicine

## 2020-10-06 ENCOUNTER — Encounter: Payer: Self-pay | Admitting: Emergency Medicine

## 2020-10-06 DIAGNOSIS — C786 Secondary malignant neoplasm of retroperitoneum and peritoneum: Secondary | ICD-10-CM | POA: Diagnosis present

## 2020-10-06 DIAGNOSIS — F32A Depression, unspecified: Secondary | ICD-10-CM | POA: Diagnosis present

## 2020-10-06 DIAGNOSIS — Z853 Personal history of malignant neoplasm of breast: Secondary | ICD-10-CM | POA: Insufficient documentation

## 2020-10-06 DIAGNOSIS — K859 Acute pancreatitis without necrosis or infection, unspecified: Secondary | ICD-10-CM | POA: Diagnosis present

## 2020-10-06 DIAGNOSIS — R1013 Epigastric pain: Secondary | ICD-10-CM | POA: Diagnosis not present

## 2020-10-06 DIAGNOSIS — I10 Essential (primary) hypertension: Secondary | ICD-10-CM | POA: Diagnosis present

## 2020-10-06 DIAGNOSIS — R1110 Vomiting, unspecified: Secondary | ICD-10-CM | POA: Diagnosis present

## 2020-10-06 DIAGNOSIS — E213 Hyperparathyroidism, unspecified: Secondary | ICD-10-CM | POA: Diagnosis present

## 2020-10-06 DIAGNOSIS — Z87891 Personal history of nicotine dependence: Secondary | ICD-10-CM | POA: Insufficient documentation

## 2020-10-06 DIAGNOSIS — G9341 Metabolic encephalopathy: Secondary | ICD-10-CM | POA: Diagnosis present

## 2020-10-06 DIAGNOSIS — Z9221 Personal history of antineoplastic chemotherapy: Secondary | ICD-10-CM | POA: Diagnosis not present

## 2020-10-06 DIAGNOSIS — I81 Portal vein thrombosis: Secondary | ICD-10-CM | POA: Diagnosis present

## 2020-10-06 DIAGNOSIS — Z85828 Personal history of other malignant neoplasm of skin: Secondary | ICD-10-CM | POA: Insufficient documentation

## 2020-10-06 DIAGNOSIS — E785 Hyperlipidemia, unspecified: Secondary | ICD-10-CM | POA: Diagnosis present

## 2020-10-06 DIAGNOSIS — I82890 Acute embolism and thrombosis of other specified veins: Secondary | ICD-10-CM | POA: Diagnosis not present

## 2020-10-06 DIAGNOSIS — R4182 Altered mental status, unspecified: Secondary | ICD-10-CM | POA: Diagnosis not present

## 2020-10-06 DIAGNOSIS — K5900 Constipation, unspecified: Secondary | ICD-10-CM | POA: Insufficient documentation

## 2020-10-06 DIAGNOSIS — K8689 Other specified diseases of pancreas: Secondary | ICD-10-CM | POA: Diagnosis not present

## 2020-10-06 DIAGNOSIS — Z136 Encounter for screening for cardiovascular disorders: Secondary | ICD-10-CM | POA: Diagnosis not present

## 2020-10-06 DIAGNOSIS — Z79899 Other long term (current) drug therapy: Secondary | ICD-10-CM | POA: Insufficient documentation

## 2020-10-06 DIAGNOSIS — F419 Anxiety disorder, unspecified: Secondary | ICD-10-CM | POA: Diagnosis present

## 2020-10-06 DIAGNOSIS — R180 Malignant ascites: Secondary | ICD-10-CM | POA: Diagnosis present

## 2020-10-06 DIAGNOSIS — I672 Cerebral atherosclerosis: Secondary | ICD-10-CM | POA: Diagnosis not present

## 2020-10-06 DIAGNOSIS — Z951 Presence of aortocoronary bypass graft: Secondary | ICD-10-CM | POA: Insufficient documentation

## 2020-10-06 DIAGNOSIS — C801 Malignant (primary) neoplasm, unspecified: Secondary | ICD-10-CM | POA: Diagnosis not present

## 2020-10-06 DIAGNOSIS — Z86718 Personal history of other venous thrombosis and embolism: Secondary | ICD-10-CM | POA: Diagnosis not present

## 2020-10-06 DIAGNOSIS — R1011 Right upper quadrant pain: Secondary | ICD-10-CM | POA: Diagnosis not present

## 2020-10-06 DIAGNOSIS — F411 Generalized anxiety disorder: Secondary | ICD-10-CM | POA: Diagnosis not present

## 2020-10-06 DIAGNOSIS — R410 Disorientation, unspecified: Secondary | ICD-10-CM | POA: Diagnosis not present

## 2020-10-06 DIAGNOSIS — C7889 Secondary malignant neoplasm of other digestive organs: Secondary | ICD-10-CM | POA: Diagnosis present

## 2020-10-06 DIAGNOSIS — Z515 Encounter for palliative care: Secondary | ICD-10-CM | POA: Diagnosis not present

## 2020-10-06 DIAGNOSIS — R1084 Generalized abdominal pain: Secondary | ICD-10-CM | POA: Diagnosis not present

## 2020-10-06 DIAGNOSIS — Z20822 Contact with and (suspected) exposure to covid-19: Secondary | ICD-10-CM | POA: Diagnosis present

## 2020-10-06 DIAGNOSIS — I2510 Atherosclerotic heart disease of native coronary artery without angina pectoris: Secondary | ICD-10-CM | POA: Insufficient documentation

## 2020-10-06 DIAGNOSIS — R188 Other ascites: Secondary | ICD-10-CM | POA: Diagnosis not present

## 2020-10-06 DIAGNOSIS — Z66 Do not resuscitate: Secondary | ICD-10-CM | POA: Diagnosis present

## 2020-10-06 DIAGNOSIS — R001 Bradycardia, unspecified: Secondary | ICD-10-CM | POA: Diagnosis not present

## 2020-10-06 DIAGNOSIS — R5381 Other malaise: Secondary | ICD-10-CM | POA: Diagnosis not present

## 2020-10-06 LAB — CBC
HCT: 32.9 % — ABNORMAL LOW (ref 36.0–46.0)
Hemoglobin: 10.3 g/dL — ABNORMAL LOW (ref 12.0–15.0)
MCH: 26.4 pg (ref 26.0–34.0)
MCHC: 31.3 g/dL (ref 30.0–36.0)
MCV: 84.4 fL (ref 80.0–100.0)
Platelets: 235 10*3/uL (ref 150–400)
RBC: 3.9 MIL/uL (ref 3.87–5.11)
RDW: 17.8 % — ABNORMAL HIGH (ref 11.5–15.5)
WBC: 16.6 10*3/uL — ABNORMAL HIGH (ref 4.0–10.5)
nRBC: 0.1 % (ref 0.0–0.2)

## 2020-10-06 LAB — COMPREHENSIVE METABOLIC PANEL
ALT: 12 U/L (ref 0–44)
AST: 28 U/L (ref 15–41)
Albumin: 3 g/dL — ABNORMAL LOW (ref 3.5–5.0)
Alkaline Phosphatase: 93 U/L (ref 38–126)
Anion gap: 9 (ref 5–15)
BUN: 24 mg/dL — ABNORMAL HIGH (ref 8–23)
CO2: 22 mmol/L (ref 22–32)
Calcium: 9.8 mg/dL (ref 8.9–10.3)
Chloride: 108 mmol/L (ref 98–111)
Creatinine, Ser: 0.72 mg/dL (ref 0.44–1.00)
GFR, Estimated: >60 mL/min (ref >60)
Glucose, Bld: 132 mg/dL — ABNORMAL HIGH (ref 70–99)
Potassium: 4.2 mmol/L (ref 3.5–5.1)
Sodium: 139 mmol/L (ref 135–145)
Total Bilirubin: 0.9 mg/dL (ref 0.3–1.2)
Total Protein: 7 g/dL (ref 6.5–8.1)

## 2020-10-06 LAB — URINALYSIS, COMPLETE (UACMP) WITH MICROSCOPIC
Bilirubin Urine: NEGATIVE
Glucose, UA: NEGATIVE mg/dL
Hgb urine dipstick: NEGATIVE
Ketones, ur: 5 mg/dL — AB
Leukocytes,Ua: NEGATIVE
Nitrite: NEGATIVE
Protein, ur: 30 mg/dL — AB
Specific Gravity, Urine: >1.046 — ABNORMAL HIGH (ref 1.005–1.030)
pH: 5 (ref 5.0–8.0)

## 2020-10-06 LAB — LIPASE, BLOOD: Lipase: 48 U/L (ref 11–51)

## 2020-10-06 MED ORDER — MAGNESIUM CITRATE PO SOLN
1.0000 | Freq: Once | ORAL | Status: AC
  Administered 2020-10-06: 1
  Filled 2020-10-06: qty 296

## 2020-10-06 MED ORDER — CEPHALEXIN 500 MG PO CAPS: 500.0000 mg | ORAL_CAPSULE | Freq: Four times a day (QID) | ORAL | 0 refills | Status: DC

## 2020-10-06 MED ORDER — DOCUSATE SODIUM 50 MG/5ML PO LIQD
50.0000 mg | Freq: Once | ORAL | Status: AC
  Administered 2020-10-06: 50 mg
  Filled 2020-10-06: qty 10

## 2020-10-06 MED ORDER — IOHEXOL 300 MG/ML  SOLN
100.0000 mL | Freq: Once | INTRAMUSCULAR | Status: AC | PRN
  Administered 2020-10-06: 100 mL via INTRAVENOUS

## 2020-10-06 NOTE — ED Provider Notes (Signed)
[facility] Emergency Department Provider Note  ____________________________________________  Time seen: Approximately 6:38 PM  I have reviewed the triage vital signs and the nursing notes.   HISTORY  Chief Complaint Emesis    HPI Theresa Andrews is a 75 y.o. female who presents the emergency department complaining of ongoing right-sided abdominal pain, nausea, emesis, constipation, abdominal distention.  Patient was discharged from the hospital 2 days ago having been evaluated for right-sided abdominal pain.  Imaging at that time had revealed what appears to be new metastatic disease likely involving necrotic abdominal nodes of the peritoneum as well as fullness about the pancreatic tail concerning for metastatic disease versus primary adenocarcinoma.  Patient states that she has had worsening nausea, vomiting, right-sided abdominal pain.  She was noted to have small amount of pelvic ascites but states that she is experiencing more abdominal distention than when she was admitted.  Patient also has not had a bowel movement since leaving and was noted to be constipated on her previous admission.  Patient denies any fevers or chills, URI symptoms.  No urinary complaints.  Patient has a history of breast cancer had recent imaging was concerning for possible new mass in the left breast.  Patient is being followed by oncology for these findings.  Patient is primarily here due to the increased nausea vomiting, pain, with ongoing constipation and worsening abdominal distention.        Past Medical History:  Diagnosis Date  . Anxiety   . Breast cancer ([place])    pT2 pN0 cM0 (stage IIA) invasive mammary carcinoma of left outer breast status post lumpectomy and sentinel node study on July 14, 2010  . CAD (coronary artery disease)   . Depression   . H/O hypokalemia   . History of chemotherapy   . History of MI (myocardial infarction)   . Hyperglycemia   . Hyperlipidemia    . Hypertension   . Peripheral neuropathy due to chemotherapy ([place])   . Personal history of chemotherapy 2011   left breast ca  . Personal history of radiation therapy 2001   left breast ca    Patient Active Problem List   Diagnosis Date Noted  . Portal vein thrombosis   . Abdominal pain 10/03/2020  . Lesion of liver 03/11/2020  . Bradycardia 10/04/2018  . Obesity (BMI 30-39.9) 05/12/2018  . Statin myopathy 05/12/2018  . Vitamin D deficiency 01/09/2018  . Hyperparathyroidism ([place]) 01/07/2018  . Carcinoma of overlapping sites of left breast in female, estrogen receptor positive ([place]) 03/27/2016  . Hypercalcemia 08/20/2015  . Diuretic-induced hypokalemia 04/29/2015  . Abnormal finding on thyroid function test 10/29/2014  . Anxiety disorder 10/29/2014  . Breast CA ([place]) 10/29/2014  . Atherosclerosis of coronary artery 10/29/2014  . Prediabetes 10/29/2014  . Gravida 1 10/29/2014  . Hyperlipidemia 10/29/2014  . Disorder of peripheral nervous system 10/29/2014  . At risk for falling 10/29/2014  . Neoplasm of skin 10/29/2014  . Benign essential HTN 04/25/2014    Past Surgical History:  Procedure Laterality Date  . ABDOMINAL HYSTERECTOMY    . BREAST BIOPSY Left 2008   neg  . BREAST BIOPSY Left 03/05/2010   invasive mammary carcinoma  . BREAST LUMPECTOMY Left 07/14/2010   INVASIVE MAMMARY CARCINOMA WITH PROMINENT INFILTRATIVE PATTERN, negative margins  . CHOLECYSTECTOMY    . COLONOSCOPY WITH PROPOFOL N/A 11/22/2015   Procedure: COLONOSCOPY WITH PROPOFOL;  Surgeon: Hulen Luster, MD;  Location: [facility] ENDOSCOPY;  Service: Gastroenterology;  Laterality: N/A;  .  CORONARY ANGIOPLASTY WITH STENT PLACEMENT  2008    Prior to Admission medications   Medication Sig Start Date End Date Taking? Authorizing Provider  cephALEXin (KEFLEX) 500 MG capsule Take 1 capsule (500 mg total) by mouth 4 (four) times daily. 10/06/20  Yes Axzel Rockhill, Charline Bills, PA-C  amLODipine (NORVASC) 10 MG tablet Take  1 tablet (10 mg total) by mouth daily. 07/29/20   Steele Sizer, MD  cholecalciferol (VITAMIN D) 1000 units tablet Take 1,000 Units by mouth daily.    [provider]  enoxaparin (LOVENOX) 40 MG/0.4ML injection Inject 0.75 mLs (75 mg total) into the skin every 12 (twelve) hours. 10/04/20   Sharen Hones, MD  Lactulose 20 GM/30ML SOLN Take 30 mLs (20 g total) by mouth daily as needed. 10/04/20   Sharen Hones, MD  metoprolol succinate (TOPROL-XL) 25 MG 24 hr tablet Take 0.5 tablets (12.5 mg total) by mouth daily. 07/29/20   Steele Sizer, MD  ondansetron (ZOFRAN) 4 MG tablet Take 1 tablet (4 mg total) by mouth daily as needed for nausea or vomiting. 10/04/20 10/04/21  Sharen Hones, MD  oxyCODONE-acetaminophen (PERCOCET) 10-325 MG tablet Take 1 tablet by mouth every 6 (six) hours as needed for pain. 10/04/20   Sharen Hones, MD  PARoxetine (PAXIL) 10 MG tablet Take 1 tablet (10 mg total) by mouth daily. 09/13/20   Steele Sizer, MD  polyethylene glycol (MIRALAX / GLYCOLAX) 17 g packet Take 17 g by mouth 2 (two) times daily as needed. 10/04/20   Sharen Hones, MD  potassium chloride SA (KLOR-CON) 20 MEQ tablet 1 pill twice a day 08/08/20   Cammie Sickle, MD  rosuvastatin (CRESTOR) 10 MG tablet Take 1 tablet (10 mg total) by mouth daily. 09/04/20   Steele Sizer, MD  telmisartan (MICARDIS) 80 MG tablet One daily 07/29/20   Steele Sizer, MD  tretinoin (RETIN-A) 0.025 % cream Apply topically at bedtime as needed and may repeat dose one time if needed. 10/23/17   [provider]    Allergies Tetanus toxoids and Shellfish allergy  Family History  Problem Relation Age of Onset  . Breast cancer Sister 21  . Hypertension Mother   . Aneurysm Mother   . Heart attack Sister   . Birth defects Sister     Social History Social History   Tobacco Use  . Smoking status: Former Smoker    Packs/day: 0.25    Years: 25.00    Pack years: 6.25    Types: Cigarettes    Quit date: 2008     Years since quitting: 14.2  . Smokeless tobacco: Never Used  . Tobacco comment: smoking cessation materials not required  Vaping Use  . Vaping Use: Never used  Substance Use Topics  . Alcohol use: No    Comment: rare; holidays  . Drug use: No     Review of Systems  Constitutional: No fever/chills Eyes: No visual changes. No discharge ENT: No upper respiratory complaints. Cardiovascular: no chest pain. Respiratory: no cough. No SOB. Gastrointestinal: Worsening right-sided abdominal pain.  Worsening abdominal distention.  Positive for nausea and emesis..  No diarrhea.  Positive constipation. Genitourinary: Negative for dysuria. No hematuria Musculoskeletal: Negative for musculoskeletal pain. Skin: Negative for rash, abrasions, lacerations, ecchymosis. Neurological: Negative for headaches, focal weakness or numbness.  10 System ROS otherwise negative.  ____________________________________________   PHYSICAL EXAM:  VITAL SIGNS: ED Triage Vitals  Enc Vitals Group     BP 10/06/20 1409 97/75     Pulse Rate 10/06/20 1409 Marland Kitchen)  113     Resp 10/06/20 1409 16     Temp 10/06/20 1409 97.6 F (36.4 C)     Temp Source 10/06/20 1409 Oral     SpO2 10/06/20 1410 98 %     Weight 10/06/20 1410 165 lb (74.8 kg)     Height 10/06/20 1410 5\' 4"  (1.626 m)     Head Circumference --      Peak Flow --      Pain Score 10/06/20 1410 2     Pain Loc --      Pain Edu? --      Excl. in [place]? --      Constitutional: Alert and oriented. Well appearing and in no acute distress. Eyes: Conjunctivae are normal. PERRL. EOMI. Head: Atraumatic. ENT:      Ears:       Nose: No congestion/rhinnorhea.      Mouth/Throat: Mucous membranes are moist.  Neck: No stridor.   Cardiovascular: Normal rate, regular rhythm. Normal S1 and S2.  Good peripheral circulation. Respiratory: Normal respiratory effort without tachypnea or retractions. Lungs CTAB. Good air entry to the bases with no decreased or absent breath  sounds. Gastrointestinal: Visualization of the abdomen reveals abdominal distention.  Bowel sounds 4 quadrants.  Soft to palpation all quadrants.  Patient is tender diffusely in the right abdomen.  This encompasses both right upper and lower quadrant.  No point specific tenderness.  No guarding or rigidity. No palpable masses. No CVA tenderness. Musculoskeletal: Full range of motion to all extremities. No gross deformities appreciated. Neurologic:  Normal speech and language. No gross focal neurologic deficits are appreciated.  Skin:  Skin is warm, dry and intact. No rash noted. Psychiatric: Mood and affect are normal. Speech and behavior are normal. Patient exhibits appropriate insight and judgement.   ____________________________________________   LABS (all labs ordered are listed, but only abnormal results are displayed)  Labs Reviewed  COMPREHENSIVE METABOLIC PANEL - Abnormal; Notable for the following components:      Result Value   Glucose, Bld 132 (*)    BUN 24 (*)    Albumin 3.0 (*)    All other components within normal limits  CBC - Abnormal; Notable for the following components:   WBC 16.6 (*)    Hemoglobin 10.3 (*)    HCT 32.9 (*)    RDW 17.8 (*)    All other components within normal limits  URINALYSIS, COMPLETE (UACMP) WITH MICROSCOPIC - Abnormal; Notable for the following components:   Color, Urine YELLOW (*)    APPearance CLOUDY (*)    Specific Gravity, Urine >1.046 (*)    Ketones, ur 5 (*)    Protein, ur 30 (*)    Bacteria, UA MANY (*)    All other components within normal limits  URINE CULTURE  LIPASE, BLOOD   ____________________________________________  EKG   ____________________________________________  RADIOLOGY I personally viewed and evaluated these images as part of my medical decision making, as well as reviewing the written report by the radiologist.  ED Provider Interpretation: Concur with radiologist finding of stable ascites, multiple  lesions concerning for metastatic disease.  Patient has findings consistent with constipation as well.  No evidence of obstruction or other infectious process.  CT ABDOMEN PELVIS W CONTRAST  Result Date: 10/06/2020 CLINICAL DATA:  Abdominal swelling, vomiting, history of left breast cancer, metastatic disease on previous CT EXAM: CT ABDOMEN AND PELVIS WITH CONTRAST TECHNIQUE: Multidetector CT imaging of the abdomen and pelvis was performed using the  standard protocol following bolus administration of intravenous contrast. CONTRAST:  163mL OMNIPAQUE IOHEXOL 300 MG/ML  SOLN COMPARISON:  10/03/2020 FINDINGS: Lower chest: Stable right pleural effusion and right lower lobe atelectasis. The soft tissue mass at the right anterior sixth costochondral junction is unchanged, please correlate with recent biopsy results. Chronic postsurgical changes left breast. Hepatobiliary: No change in the multiple confluent hypodense masses within the liver consistent with presumed metastases. Dilated thrombosed portal vein again noted. Pancreas: There is marked pancreatic parenchymal atrophy. There are multiple pancreatic masses identified. In the pancreatic tail hypodense mass measures approximately 2.4 x 1.8 cm image 24/2. Hypodense mass in the dorsal aspect of the pancreatic body image 28/2 measures 2.2 x 1.9 cm, with hyperdense margins. Pancreatic duct dilation unchanged. Spleen: Spleen demonstrates normal size without focal abnormality. Splenic vein is patent. Adrenals/Urinary Tract: Indeterminate 1.8 cm hypodensity upper pole left kidney with Hounsfield attenuation of 43, stable. No other focal renal abnormalities. No renal tract calculi or obstructive uropathy. The bladder is decompressed with no focal abnormalities. The adrenals are normal. Stomach/Bowel: There is moderate gas and stool throughout the colon which may reflect an element of constipation. No evidence of bowel obstruction or ileus. Vascular/Lymphatic: Extensive  atherosclerosis of the aorta is again noted unchanged. Chronic portal vein thrombosis with cavernous transformation again seen. The splenic vein and superior mesenteric vein are patent. Pathologic adenopathy is seen throughout the upper abdomen. 10 mm lymph node in the porta hepatis image 25/2 is stable. Multiple enlarged lymph nodes are seen within the gastrohepatic ligament, largest measuring 11 mm in short axis image 23/2. Necrotic lymph node dorsal to the pancreatic body image 28/2 measures 19 mm in short axis. Aortocaval lymph node image 36/2 measures 10 mm in short axis. Reproductive: Status post hysterectomy. No adnexal masses. Other: Moderate abdominal and pelvic ascites, without appreciable change since prior study. There is some soft tissue nodularity within the right upper quadrant mesentery, just inferior to the liver and along the hepatic flexure of the colon, concerning for peritoneal implant. No free intraperitoneal gas. Stable umbilical hernia containing mesenteric fat. Inflammatory changes within the hernia may suggest incarceration. Musculoskeletal: No acute or destructive bony lesions. Reconstructed images demonstrate no additional findings. IMPRESSION: 1. Extensive intra-abdominal and intrapelvic metastases, without significant change since prior study. Given the above findings, this may reflect primary pancreatic adenocarcinoma with diffuse metastases. Correlation with recent biopsy results recommended. No significant change in the appearance of the metastatic disease since prior study. 2. Moderate ascites, without significant change since prior study. This is likely malignant ascites given the soft tissue nodularity within the right upper quadrant mesentery. 3. Chronic portal vein thrombosis with cavernous transformation. 4. No evidence of bowel obstruction or ileus. Retained gas and stool throughout the colon may reflect constipation. 5. Stable right pleural effusion. 6.  Aortic  Atherosclerosis (ICD10-I70.0). Electronically Signed   By: Randa Ngo M.D.   On: 10/06/2020 19:51    ____________________________________________    PROCEDURES  Procedure(s) performed:    Procedures    Medications  docusate (COLACE) 50 MG/5ML liquid 50 mg (has no administration in time range)  magnesium citrate solution 1 Bottle (has no administration in time range)  iohexol (OMNIPAQUE) 300 MG/ML solution 100 mL (100 mLs Intravenous Contrast Given 10/06/20 1901)     ____________________________________________   INITIAL IMPRESSION / ASSESSMENT AND PLAN / ED COURSE  Pertinent labs & imaging results that were available during my care of the patient were reviewed by me and considered in my medical  decision making (see chart for details).  Review of the [place] CSRS was performed in accordance of the [place] prior to dispensing any controlled drugs.           Patient's diagnosis is consistent with generalized abdominal pain, constipation.  Patient presented to emergency department complaining of increased abdominal pain, reported increased distention and ongoing constipation.  Patient been recently admitted.  Recent CT had showed multiple lesions concerning for new metastatic disease.  Patient is currently undergoing work-up with oncology.  Patient states that she is present in the emergency department today as her abdominal pain is worsening, not specifically along the right side.  Patient has had some nausea and emesis.  She is constipated.  No urinary symptoms.  No fevers or chills.  No URI symptoms.  Imaging revealed stable findings on CT.  Stable ascites, findings again consistent with what appears to be metastatic disease.  Patient has findings concerning for constipation on imaging as well.  Patient will be treated with enema and will continue to use laxatives already prescribed at time of discharge.  She is being followed by oncology for likely metastatic disease.  Patient did  have a slight bump in her white blood cell count with some bacteria in her urine.  I feel urinary tract infection is less likely but I will treat with Keflex.  Urine will be sent for culture.  Follow-up primary care and oncology as needed.  Return precautions discussed with the patient and her daughter. Patient is given ED precautions to return to the ED for any worsening or new symptoms.     ____________________________________________  FINAL CLINICAL IMPRESSION(S) / ED DIAGNOSES  Final diagnoses:  Generalized abdominal pain  Constipation, unspecified constipation type      NEW MEDICATIONS STARTED DURING THIS VISIT:  ED Discharge Orders         Ordered    cephALEXin (KEFLEX) 500 MG capsule  4 times daily        10/06/20 2103              This chart was dictated using voice recognition software/Dragon. Despite best efforts to proofread, errors can occur which can change the meaning. Any change was purely unintentional.    Darletta Moll, PA-C 10/06/20 2104    Nance Pear, MD 10/06/20 2106

## 2020-10-06 NOTE — ED Triage Notes (Signed)
Pt to ED via [facility] from home for vomiting. Pt states that she takes blood pressure medication and every time she takes it on an empty stomach she vomits. Pt states that this morning she took her medication on empty stomach and vomited. Pt states that her daughter called EMS to have her evaluated. Pt was D/C from hospital on 3/11 for portal vein thrombosis and concern for metastatic cancer. Pt is in NAD at this time.

## 2020-10-06 NOTE — ED Notes (Signed)
Awaiting med from pharmacy to admin enema, pt and family updated to same, pt tolerating mag citrate, no other needs at this time

## 2020-10-08 LAB — CULTURE, BLOOD (ROUTINE X 2)
Culture: NO GROWTH
Culture: NO GROWTH
Special Requests: ADEQUATE

## 2020-10-08 LAB — URINE CULTURE: Culture: <10000 — AB

## 2020-10-09 ENCOUNTER — Emergency Department: Payer: Medicare PPO

## 2020-10-09 ENCOUNTER — Telehealth: Payer: Medicare PPO

## 2020-10-09 ENCOUNTER — Other Ambulatory Visit: Payer: Self-pay | Admitting: Internal Medicine

## 2020-10-09 ENCOUNTER — Inpatient Hospital Stay: Payer: Medicare PPO

## 2020-10-09 ENCOUNTER — Other Ambulatory Visit: Payer: Self-pay

## 2020-10-09 ENCOUNTER — Inpatient Hospital Stay
Admission: EM | Admit: 2020-10-09 | Discharge: 2020-10-12 | DRG: 438 | Disposition: A | Discharge status: Home / Self Care | Payer: Medicare PPO | Attending: Internal Medicine | Admitting: Internal Medicine

## 2020-10-09 DIAGNOSIS — F32A Depression, unspecified: Secondary | ICD-10-CM | POA: Diagnosis present

## 2020-10-09 DIAGNOSIS — C7889 Secondary malignant neoplasm of other digestive organs: Secondary | ICD-10-CM | POA: Diagnosis present

## 2020-10-09 DIAGNOSIS — C801 Malignant (primary) neoplasm, unspecified: Secondary | ICD-10-CM

## 2020-10-09 DIAGNOSIS — R180 Malignant ascites: Secondary | ICD-10-CM | POA: Diagnosis present

## 2020-10-09 DIAGNOSIS — I81 Portal vein thrombosis: Secondary | ICD-10-CM | POA: Diagnosis present

## 2020-10-09 DIAGNOSIS — K859 Acute pancreatitis without necrosis or infection, unspecified: Secondary | ICD-10-CM | POA: Diagnosis present

## 2020-10-09 DIAGNOSIS — C786 Secondary malignant neoplasm of retroperitoneum and peritoneum: Secondary | ICD-10-CM | POA: Diagnosis present

## 2020-10-09 DIAGNOSIS — I10 Essential (primary) hypertension: Secondary | ICD-10-CM | POA: Diagnosis present

## 2020-10-09 DIAGNOSIS — Z515 Encounter for palliative care: Secondary | ICD-10-CM

## 2020-10-09 DIAGNOSIS — Z20822 Contact with and (suspected) exposure to covid-19: Secondary | ICD-10-CM | POA: Diagnosis present

## 2020-10-09 DIAGNOSIS — Z9221 Personal history of antineoplastic chemotherapy: Secondary | ICD-10-CM

## 2020-10-09 DIAGNOSIS — F411 Generalized anxiety disorder: Secondary | ICD-10-CM | POA: Diagnosis not present

## 2020-10-09 DIAGNOSIS — R1011 Right upper quadrant pain: Secondary | ICD-10-CM | POA: Diagnosis not present

## 2020-10-09 DIAGNOSIS — Z66 Do not resuscitate: Secondary | ICD-10-CM | POA: Diagnosis present

## 2020-10-09 DIAGNOSIS — R1110 Vomiting, unspecified: Secondary | ICD-10-CM | POA: Diagnosis present

## 2020-10-09 DIAGNOSIS — F419 Anxiety disorder, unspecified: Secondary | ICD-10-CM | POA: Diagnosis present

## 2020-10-09 DIAGNOSIS — Z853 Personal history of malignant neoplasm of breast: Secondary | ICD-10-CM

## 2020-10-09 DIAGNOSIS — R1013 Epigastric pain: Secondary | ICD-10-CM

## 2020-10-09 DIAGNOSIS — E213 Hyperparathyroidism, unspecified: Secondary | ICD-10-CM | POA: Diagnosis present

## 2020-10-09 DIAGNOSIS — Z86718 Personal history of other venous thrombosis and embolism: Secondary | ICD-10-CM | POA: Diagnosis not present

## 2020-10-09 DIAGNOSIS — R109 Unspecified abdominal pain: Secondary | ICD-10-CM | POA: Diagnosis present

## 2020-10-09 DIAGNOSIS — R4182 Altered mental status, unspecified: Secondary | ICD-10-CM

## 2020-10-09 DIAGNOSIS — E785 Hyperlipidemia, unspecified: Secondary | ICD-10-CM | POA: Diagnosis present

## 2020-10-09 DIAGNOSIS — G9341 Metabolic encephalopathy: Secondary | ICD-10-CM | POA: Diagnosis present

## 2020-10-09 LAB — COMPREHENSIVE METABOLIC PANEL
ALT: 13 U/L (ref 0–44)
AST: 25 U/L (ref 15–41)
Albumin: 3 g/dL — ABNORMAL LOW (ref 3.5–5.0)
Alkaline Phosphatase: 97 U/L (ref 38–126)
Anion gap: 8 (ref 5–15)
BUN: 28 mg/dL — ABNORMAL HIGH (ref 8–23)
CO2: 24 mmol/L (ref 22–32)
Calcium: 9.6 mg/dL (ref 8.9–10.3)
Chloride: 107 mmol/L (ref 98–111)
Creatinine, Ser: 0.7 mg/dL (ref 0.44–1.00)
GFR, Estimated: >60 mL/min (ref >60)
Glucose, Bld: 119 mg/dL — ABNORMAL HIGH (ref 70–99)
Potassium: 4.3 mmol/L (ref 3.5–5.1)
Sodium: 139 mmol/L (ref 135–145)
Total Bilirubin: 0.6 mg/dL (ref 0.3–1.2)
Total Protein: 6.8 g/dL (ref 6.5–8.1)

## 2020-10-09 LAB — URINALYSIS, COMPLETE (UACMP) WITH MICROSCOPIC
Bilirubin Urine: NEGATIVE
Glucose, UA: NEGATIVE mg/dL
Hgb urine dipstick: NEGATIVE
Ketones, ur: 5 mg/dL — AB
Nitrite: NEGATIVE
Protein, ur: 30 mg/dL — AB
Specific Gravity, Urine: 1.031 — ABNORMAL HIGH (ref 1.005–1.030)
Squamous Epithelial / HPF: >50 — ABNORMAL HIGH (ref 0–5)
pH: 5 (ref 5.0–8.0)

## 2020-10-09 LAB — CBC WITH DIFFERENTIAL/PLATELET
Abs Immature Granulocytes: 0.13 10*3/uL — ABNORMAL HIGH (ref 0.00–0.07)
Basophils Absolute: 0 10*3/uL (ref 0.0–0.1)
Basophils Relative: 0 %
Eosinophils Absolute: 0.4 10*3/uL (ref 0.0–0.5)
Eosinophils Relative: 3 %
HCT: 32.4 % — ABNORMAL LOW (ref 36.0–46.0)
Hemoglobin: 10 g/dL — ABNORMAL LOW (ref 12.0–15.0)
Immature Granulocytes: 1 %
Lymphocytes Relative: 7 %
Lymphs Abs: 1 10*3/uL (ref 0.7–4.0)
MCH: 26 pg (ref 26.0–34.0)
MCHC: 30.9 g/dL (ref 30.0–36.0)
MCV: 84.4 fL (ref 80.0–100.0)
Monocytes Absolute: 1 10*3/uL (ref 0.1–1.0)
Monocytes Relative: 8 %
Neutro Abs: 11 10*3/uL — ABNORMAL HIGH (ref 1.7–7.7)
Neutrophils Relative %: 81 %
Platelets: 265 10*3/uL (ref 150–400)
RBC: 3.84 MIL/uL — ABNORMAL LOW (ref 3.87–5.11)
RDW: 17.4 % — ABNORMAL HIGH (ref 11.5–15.5)
WBC: 13.7 10*3/uL — ABNORMAL HIGH (ref 4.0–10.5)
nRBC: 0 % (ref 0.0–0.2)

## 2020-10-09 LAB — RESP PANEL BY RT-PCR (FLU A&B, COVID) ARPGX2
Influenza A by PCR: NEGATIVE
Influenza B by PCR: NEGATIVE
SARS Coronavirus 2 by RT PCR: NEGATIVE

## 2020-10-09 LAB — BODY FLUID CELL COUNT WITH DIFFERENTIAL
Eos, Fluid: 0 %
Lymphs, Fluid: 32 %
Monocyte-Macrophage-Serous Fluid: 54 %
Neutrophil Count, Fluid: 14 %
Total Nucleated Cell Count, Fluid: 1195 cu mm

## 2020-10-09 LAB — LIPASE, BLOOD: Lipase: 24 U/L (ref 11–51)

## 2020-10-09 LAB — AMYLASE, PLEURAL OR PERITONEAL FLUID: Amylase, Fluid: 17 U/L

## 2020-10-09 LAB — PROTEIN, PLEURAL OR PERITONEAL FLUID: Total protein, fluid: <3 g/dL

## 2020-10-09 LAB — ALBUMIN, PLEURAL OR PERITONEAL FLUID: Albumin, Fluid: 1.5 g/dL

## 2020-10-09 LAB — AMMONIA: Ammonia: 11 umol/L (ref 9–35)

## 2020-10-09 MED ORDER — SODIUM CHLORIDE 0.9 % IV SOLN
2.0000 g | INTRAVENOUS | Status: DC
  Administered 2020-10-09 – 2020-10-11 (×3): 2 g via INTRAVENOUS
  Filled 2020-10-09: qty 20
  Filled 2020-10-09: qty 2
  Filled 2020-10-09 (×2): qty 20

## 2020-10-09 MED ORDER — MORPHINE SULFATE (PF) 2 MG/ML IV SOLN
2.0000 mg | INTRAVENOUS | Status: DC | PRN
  Administered 2020-10-09: 2 mg via INTRAVENOUS
  Filled 2020-10-09: qty 1

## 2020-10-09 MED ORDER — IRBESARTAN 150 MG PO TABS
300.0000 mg | ORAL_TABLET | Freq: Every day | ORAL | Status: DC
  Administered 2020-10-09 – 2020-10-12 (×4): 300 mg via ORAL
  Filled 2020-10-09 (×4): qty 2

## 2020-10-09 MED ORDER — SODIUM CHLORIDE 0.9 % IV BOLUS
500.0000 mL | Freq: Once | INTRAVENOUS | Status: AC
  Administered 2020-10-09: 500 mL via INTRAVENOUS

## 2020-10-09 MED ORDER — ONDANSETRON HCL 4 MG/2ML IJ SOLN
4.0000 mg | Freq: Four times a day (QID) | INTRAMUSCULAR | Status: DC | PRN
  Filled 2020-10-09: qty 2

## 2020-10-09 MED ORDER — METOPROLOL SUCCINATE ER 25 MG PO TB24
12.5000 mg | ORAL_TABLET | Freq: Every day | ORAL | Status: DC
  Administered 2020-10-09 – 2020-10-12 (×5): 12.5 mg via ORAL
  Filled 2020-10-09: qty 1
  Filled 2020-10-09: qty 0.5
  Filled 2020-10-09 (×2): qty 1

## 2020-10-09 MED ORDER — POLYETHYLENE GLYCOL 3350 17 G PO PACK
17.0000 g | PACK | Freq: Every day | ORAL | Status: DC
  Administered 2020-10-09 – 2020-10-12 (×4): 17 g via ORAL
  Filled 2020-10-09 (×4): qty 1

## 2020-10-09 MED ORDER — AMLODIPINE BESYLATE 10 MG PO TABS
10.0000 mg | ORAL_TABLET | Freq: Every day | ORAL | Status: DC
  Administered 2020-10-09 – 2020-10-11 (×3): 10 mg via ORAL
  Filled 2020-10-09 (×3): qty 1
  Filled 2020-10-09: qty 2

## 2020-10-09 MED ORDER — IOHEXOL 300 MG/ML  SOLN
100.0000 mL | Freq: Once | INTRAMUSCULAR | Status: AC | PRN
  Administered 2020-10-09: 100 mL via INTRAVENOUS

## 2020-10-09 MED ORDER — PAROXETINE HCL 10 MG PO TABS
10.0000 mg | ORAL_TABLET | Freq: Every day | ORAL | Status: DC
  Administered 2020-10-09 – 2020-10-12 (×4): 10 mg via ORAL
  Filled 2020-10-09 (×4): qty 1

## 2020-10-09 MED ORDER — ONDANSETRON HCL 4 MG PO TABS: 4.0000 mg | ORAL_TABLET | Freq: Four times a day (QID) | ORAL | Status: DC | PRN

## 2020-10-09 MED ORDER — SODIUM CHLORIDE 0.9 % IV SOLN
1.0000 g | Freq: Once | INTRAVENOUS | Status: DC
  Filled 2020-10-09: qty 10

## 2020-10-09 MED ORDER — ROSUVASTATIN CALCIUM 10 MG PO TABS
10.0000 mg | ORAL_TABLET | Freq: Every day | ORAL | Status: DC
  Administered 2020-10-09 – 2020-10-12 (×4): 10 mg via ORAL
  Filled 2020-10-09 (×4): qty 1

## 2020-10-09 MED ORDER — LACTULOSE 10 GM/15ML PO SOLN: 20.0000 g | Freq: Every day | ORAL | Status: DC | PRN

## 2020-10-09 MED ORDER — VITAMIN D 25 MCG (1000 UNIT) PO TABS
1000.0000 [IU] | ORAL_TABLET | Freq: Every day | ORAL | Status: DC
  Administered 2020-10-09 – 2020-10-12 (×4): 1000 [IU] via ORAL
  Filled 2020-10-09 (×4): qty 1

## 2020-10-09 MED ORDER — ENOXAPARIN SODIUM 40 MG/0.4ML ~~LOC~~ SOLN: 75.0000 mg | Freq: Two times a day (BID) | SUBCUTANEOUS | Status: DC

## 2020-10-09 NOTE — ED Provider Notes (Signed)
[facility] Emergency Department Provider Note    Event Date/Time   First MD Initiated Contact with Patient 10/09/20 [phone]     (approximate)  I have reviewed the triage vital signs and the nursing notes.   HISTORY  Chief Complaint Constipation    HPI Theresa Andrews is a 75 y.o. female bolus past medical history presents to the ER for evaluation of worsening abdominal distention concern for constipation as well as some confusion has been getting worse over the past few days.  Patient with extensive metastatic cancer disease not currently on any chemotherapy or radiation.  No new medications.  Recently put on antibiotic and was told that she had constipation when last visiting the ER.    Past Medical History:  Diagnosis Date  . Anxiety   . Breast cancer ([place])    pT2 pN0 cM0 (stage IIA) invasive mammary carcinoma of left outer breast status post lumpectomy and sentinel node study on July 14, 2010  . CAD (coronary artery disease)   . Depression   . H/O hypokalemia   . History of chemotherapy   . History of MI (myocardial infarction)   . Hyperglycemia   . Hyperlipidemia   . Hypertension   . Peripheral neuropathy due to chemotherapy ([place])   . Personal history of chemotherapy 2011   left breast ca  . Personal history of radiation therapy 2001   left breast ca   Family History  Problem Relation Age of Onset  . Breast cancer Sister 73  . Hypertension Mother   . Aneurysm Mother   . Heart attack Sister   . Birth defects Sister    Past Surgical History:  Procedure Laterality Date  . ABDOMINAL HYSTERECTOMY    . BREAST BIOPSY Left 2008   neg  . BREAST BIOPSY Left 03/05/2010   invasive mammary carcinoma  . BREAST LUMPECTOMY Left 07/14/2010   INVASIVE MAMMARY CARCINOMA WITH PROMINENT INFILTRATIVE PATTERN, negative margins  . CHOLECYSTECTOMY    . COLONOSCOPY WITH PROPOFOL N/A 11/22/2015   Procedure: COLONOSCOPY WITH PROPOFOL;  Surgeon: Hulen Luster,  MD;  Location[facility] ENDOSCOPY;  Service: Gastroenterology;  Laterality: N/A;  . CORONARY ANGIOPLASTY WITH STENT PLACEMENT  2008   Patient Active Problem List   Diagnosis Date Noted  . Portal vein thrombosis   . Abdominal pain 10/03/2020  . Lesion of liver 03/11/2020  . Bradycardia 10/04/2018  . Obesity (BMI 30-39.9) 05/12/2018  . Statin myopathy 05/12/2018  . Vitamin D deficiency 01/09/2018  . Hyperparathyroidism ([place]) 01/07/2018  . Carcinoma of overlapping sites of left breast in female, estrogen receptor positive ([place]) 03/27/2016  . Hypercalcemia 08/20/2015  . Diuretic-induced hypokalemia 04/29/2015  . Abnormal finding on thyroid function test 10/29/2014  . Anxiety disorder 10/29/2014  . Breast CA ([place]) 10/29/2014  . Atherosclerosis of coronary artery 10/29/2014  . Prediabetes 10/29/2014  . Gravida 1 10/29/2014  . Hyperlipidemia 10/29/2014  . Disorder of peripheral nervous system 10/29/2014  . At risk for falling 10/29/2014  . Neoplasm of skin 10/29/2014  . Benign essential HTN 04/25/2014      Prior to Admission medications   Medication Sig Start Date End Date Taking? Authorizing Provider  amLODipine (NORVASC) 10 MG tablet Take 1 tablet (10 mg total) by mouth daily. 07/29/20   Steele Sizer, MD  cephALEXin (KEFLEX) 500 MG capsule Take 1 capsule (500 mg total) by mouth 4 (four) times daily. 10/06/20   Cuthriell, Charline Bills, PA-C  cholecalciferol (VITAMIN D) 1000 units tablet Take  1,000 Units by mouth daily.    [provider]  enoxaparin (LOVENOX) 40 MG/0.4ML injection Inject 0.75 mLs (75 mg total) into the skin every 12 (twelve) hours. 10/04/20   Sharen Hones, MD  Lactulose 20 GM/30ML SOLN Take 30 mLs (20 g total) by mouth daily as needed. 10/04/20   Sharen Hones, MD  metoprolol succinate (TOPROL-XL) 25 MG 24 hr tablet Take 0.5 tablets (12.5 mg total) by mouth daily. 07/29/20   Steele Sizer, MD  ondansetron (ZOFRAN) 4 MG tablet Take 1 tablet (4 mg total) by mouth daily  as needed for nausea or vomiting. 10/04/20 10/04/21  Sharen Hones, MD  oxyCODONE-acetaminophen (PERCOCET) 10-325 MG tablet Take 1 tablet by mouth every 6 (six) hours as needed for pain. 10/04/20   Sharen Hones, MD  PARoxetine (PAXIL) 10 MG tablet Take 1 tablet (10 mg total) by mouth daily. 09/13/20   Steele Sizer, MD  polyethylene glycol (MIRALAX / GLYCOLAX) 17 g packet Take 17 g by mouth 2 (two) times daily as needed. 10/04/20   Sharen Hones, MD  potassium chloride SA (KLOR-CON) 20 MEQ tablet 1 pill twice a day 08/08/20   Cammie Sickle, MD  rosuvastatin (CRESTOR) 10 MG tablet Take 1 tablet (10 mg total) by mouth daily. 09/04/20   Steele Sizer, MD  telmisartan (MICARDIS) 80 MG tablet One daily 07/29/20   Steele Sizer, MD  tretinoin (RETIN-A) 0.025 % cream Apply topically at bedtime as needed and may repeat dose one time if needed. 10/23/17   [provider]    Allergies Tetanus toxoids and Shellfish allergy    Social History Social History   Tobacco Use  . Smoking status: Former Smoker    Packs/day: 0.25    Years: 25.00    Pack years: 6.25    Types: Cigarettes    Quit date: 2008    Years since quitting: 14.2  . Smokeless tobacco: Never Used  . Tobacco comment: smoking cessation materials not required  Vaping Use  . Vaping Use: Never used  Substance Use Topics  . Alcohol use: No    Comment: rare; holidays  . Drug use: No    Review of Systems Patient denies headaches, rhinorrhea, blurry vision, numbness, shortness of breath, chest pain, edema, cough, abdominal pain, nausea, vomiting, diarrhea, dysuria, fevers, rashes or hallucinations unless otherwise stated above in HPI. ____________________________________________   PHYSICAL EXAM:  VITAL SIGNS: Vitals:   10/09/20 0945 10/09/20 1000  BP: 130/74 140/69  Pulse: 89 87  Resp:  16  Temp:    SpO2: 97% 98%    Constitutional: Alert and oriented x 3 but acting more confused according to family, frail  appearing Eyes: Conjunctivae are normal.  Head: Atraumatic. Nose: No congestion/rhinnorhea. Mouth/Throat: Mucous membranes are moist.   Neck: No stridor. Painless ROM.  Cardiovascular: Normal rate, regular rhythm. Grossly normal heart sounds.  Good peripheral circulation. Respiratory: Normal respiratory effort.  No retractions. Lungs CTAB. Gastrointestinal: Soft with mild tenderness to palpation left epigastric and upper quadrant area.. No abdominal bruits. No CVA tenderness. Genitourinary: Soft brown nonmelanotic nonbloody stool in the rectal vault.  No impaction. Musculoskeletal: No lower extremity tenderness nor edema.  No joint effusions. Neurologic:  Normal speech and language. No gross focal neurologic deficits are appreciated. No facial droop Skin:  Skin is warm, dry and intact. No rash noted. Psychiatric: Mood and affect are normal. Speech and behavior are normal.  ____________________________________________   LABS (all labs ordered are listed, but only abnormal results are displayed)  Results for orders placed or performed during the hospital encounter of 10/09/20 (from the past 24 hour(s))  CBC with Differential     Status: Abnormal   Collection Time: 10/09/20  8:21 AM  Result Value Ref Range   WBC 13.7 (H) 4.0 - 10.5 K/uL   RBC 3.84 (L) 3.87 - 5.11 MIL/uL   Hemoglobin 10.0 (L) 12.0 - 15.0 g/dL   HCT 32.4 (L) 36.0 - 46.0 %   MCV 84.4 80.0 - 100.0 fL   MCH 26.0 26.0 - 34.0 pg   MCHC 30.9 30.0 - 36.0 g/dL   RDW 17.4 (H) 11.5 - 15.5 %   Platelets 265 150 - 400 K/uL   nRBC 0.0 0.0 - 0.2 %   Neutrophils Relative % 81 %   Neutro Abs 11.0 (H) 1.7 - 7.7 K/uL   Lymphocytes Relative 7 %   Lymphs Abs 1.0 0.7 - 4.0 K/uL   Monocytes Relative 8 %   Monocytes Absolute 1.0 0.1 - 1.0 K/uL   Eosinophils Relative 3 %   Eosinophils Absolute 0.4 0.0 - 0.5 K/uL   Basophils Relative 0 %   Basophils Absolute 0.0 0.0 - 0.1 K/uL   Immature Granulocytes 1 %   Abs Immature Granulocytes  0.13 (H) 0.00 - 0.07 K/uL  Comprehensive metabolic panel     Status: Abnormal   Collection Time: 10/09/20  8:21 AM  Result Value Ref Range   Sodium 139 135 - 145 mmol/L   Potassium 4.3 3.5 - 5.1 mmol/L   Chloride 107 98 - 111 mmol/L   CO2 24 22 - 32 mmol/L   Glucose, Bld 119 (H) 70 - 99 mg/dL   BUN 28 (H) 8 - 23 mg/dL   Creatinine, Ser 0.70 0.44 - 1.00 mg/dL   Calcium 9.6 8.9 - 10.3 mg/dL   Total Protein 6.8 6.5 - 8.1 g/dL   Albumin 3.0 (L) 3.5 - 5.0 g/dL   AST 25 15 - 41 U/L   ALT 13 0 - 44 U/L   Alkaline Phosphatase 97 38 - 126 U/L   Total Bilirubin 0.6 0.3 - 1.2 mg/dL   GFR, Estimated >60 >60 mL/min   Anion gap 8 5 - 15  Lipase, blood     Status: None   Collection Time: 10/09/20  8:21 AM  Result Value Ref Range   Lipase 24 11 - 51 U/L  Urinalysis, Complete w Microscopic     Status: Abnormal   Collection Time: 10/09/20  8:57 AM  Result Value Ref Range   Color, Urine AMBER (A) YELLOW   APPearance CLOUDY (A) CLEAR   Specific Gravity, Urine 1.031 (H) 1.005 - 1.030   pH 5.0 5.0 - 8.0   Glucose, UA NEGATIVE NEGATIVE mg/dL   Hgb urine dipstick NEGATIVE NEGATIVE   Bilirubin Urine NEGATIVE NEGATIVE   Ketones, ur 5 (A) NEGATIVE mg/dL   Protein, ur 30 (A) NEGATIVE mg/dL   Nitrite NEGATIVE NEGATIVE   Leukocytes,Ua TRACE (A) NEGATIVE   RBC / HPF 6-10 0 - 5 RBC/hpf   WBC, UA 11-20 0 - 5 WBC/hpf   Bacteria, UA FEW (A) NONE SEEN   Squamous Epithelial / LPF >50 (H) 0 - 5   Mucus PRESENT    Amorphous Crystal PRESENT   Ammonia     Status: None   Collection Time: 10/09/20 10:05 AM  Result Value Ref Range   Ammonia 11 9 - 35 umol/L   ____________________________________________  EKG My review and personal interpretation at Time: 8:08  Indication:confusion  Rate: 95  Rhythm: sinus Axis: normal Other: normal intervals, no stemi ____________________________________________  RADIOLOGY   ____________________________________________   PROCEDURES  Procedure(s) performed:   Procedures    Critical Care performed:  ____________________________________________   INITIAL IMPRESSION / ASSESSMENT AND PLAN / ED COURSE  Pertinent labs & imaging results that were available during my care of the patient were reviewed by me and considered in my medical decision making (see chart for details).   DDX: Dehydration, sepsis, pna, uti, hypoglycemia, cva, drug effect, withdrawal, encephalitis   TALINE NASS is a 75 y.o. who presents to the ED with presenting symptoms as described above patient chronically ill-appearing and is somewhat confused.  Very broad differential and her presentation is complicated by recent new diagnosis of metastatic pancreatic disease.  Blood work will be ordered for above differential we will give some IV fluids as she reports that her p.o. intake has been decreased.  Will order CT imaging to further evaluate.  Clinical Course as of 10/09/20 1141  Wed Oct 09, 2020  1126 I discussed the case in consultation with Dr. Mardella Layman with the patient.  Discussed my concern for her persistent ascites and abdominal pain with mild white count.  Does agree with plan for IV antibiotics and does recommend ultrasound paracentesis and admission to the hospital and they will have further discussion with patient based on results on further therapy.  I discussed CODE STATUS with the patient and daughter at bedside states that she is a full code currently.  Will consult hospitalist for admission. [PR]    Clinical Course User Index [PR] Merlyn Lot, MD    The patient was evaluated in Emergency Department today for the symptoms described in the history of present illness. He/she was evaluated in the context of the global COVID-19 pandemic, which necessitated consideration that the patient might be at risk for infection with the SARS-CoV-2 virus that causes COVID-19. Institutional protocols and algorithms that pertain to the evaluation of patients at risk  for COVID-19 are in a state of rapid change based on information released by regulatory bodies including the CDC and federal and state organizations. These policies and algorithms were followed during the patient's care in the ED.  As part of my medical decision making, I reviewed the following data within the [place] notes reviewed and incorporated, Labs reviewed, notes from prior ED visits and [place] Controlled Substance Database   ____________________________________________   FINAL CLINICAL IMPRESSION(S) / ED DIAGNOSES  Final diagnoses:  Epigastric abdominal pain  Altered mental status, unspecified altered mental status type  Malignant ascites      NEW MEDICATIONS STARTED DURING THIS VISIT:  New Prescriptions   No medications on file     Note:  This document was prepared using Dragon voice recognition software and may include unintentional dictation errors.    Merlyn Lot, MD 10/09/20 1141

## 2020-10-09 NOTE — H&P (Signed)
History and Physical    Theresa Andrews:[id number] DOB: 01/18/1946 DOA: 10/09/2020  PCP: Steele Sizer, MD   Patient coming from: Home  I have personally briefly reviewed patient's old medical records in [place]  Chief Complaint: Abdominal pain History obtained from patient and her daughter at the bedside.  HPI: Theresa Andrews is a 75 y.o. female with medical history significant for left-sided breast cancer status post lumpectomy, radiation and chemotherapy, history of hyperparathyroidism, hypertension, anxiety disorder, hypocalcemia and dyslipidemia who presents to the ER for evaluation of persistent abdominal pain as well as mental status changes.  Patient was recently discharged from the hospital for similar complaint and had a biopsy of a soft tissue mass in the right chest which showed poorly differentiated adenocarcinoma, biopsy compatible with metastatic pancreatic adenocarcinoma. Patient has had persistent confusion for about 3weeks and per her daughter is requiring more assistance with activities of daily living.  Her daughter states that she and her siblings are having to stay with the patient around the clock due to concerns about her mental status changes. Patient complains of pain in the right upper quadrant and describes it as a nagging constant pain which she rates an 8 x 10 in intensity at its worst.  Pain is nonradiating.  She has occasional pain relief when she takes her opioid therapy.  Abdominal pain is associated with poor oral intake, unintentional weight loss (about 10 pounds in the last 6 months), increased abdominal girth and constipation. She denies having any nausea, no vomiting, no diarrhea, no chest pain, no shortness of breath, no palpitations, no dizziness, no lightheadedness, no cough, no fever, no chills, no dysuria, no nocturia, no hematuria. Labs show sodium 139, potassium 4.2, chloride 107, bicarb 24, glucose 119, BUN 28, creatinine 0.70, calcium  9.6, alkaline phosphatase 97, albumin 3.0, lipase 24, AST 25, ALT 13, total protein 6.8, ammonia level, white count 13.7, hemoglobin 10.0, hematocrit 32.4, MCV 84.4, RDW 17.4, platelet count 265. CT scan of abdomen and pelvis shows extensive metastatic disease throughout the abdomen as described above. Findings stable since prior study. Chronic portal vein thrombosis with cavernous transformation, Stable. Upper abdominal and retroperitoneal adenopathy, stable. Large volume ascites, likely malignant ascites, stable. No evidence of bowel obstruction. Stable moderate right pleural effusion with right lower lobe atelectasis. Chest x-ray reviewed by me shows progression of right lower lobe airspace disease which may represent atelectasis or pneumonia. CT scan of the head without contrast shows no acute intracranial abnormality.  No abnormal enhancement. Twelve-lead EKG reviewed by me shows sinus rhythm with nonspecific T wave abnormality.   ED Course: Patient is a 75 year old African-American female who presents to the ER with her daughter for evaluation of persistent abdominal pain mostly in the right upper quadrant.  She was recently hospitalized for same and was discharged home with a diagnosis of constipation.  Patient states that she has been compliant with stool softeners without any improvement in her symptoms.  Patient had a biopsy of the right chest wall mass which is consistent with metastatic adenocarcinoma of the pancreas.  She also has massive ascites and with diffuse abdominal pain there is a concern for possible SBP.  She will be admitted for further evaluation and treatment.  Oncology was consulted in the ER.   Review of Systems: As per HPI otherwise all other systems reviewed and negative.    Past Medical History:  Diagnosis Date  . Anxiety   . Breast cancer ([place])    pT2  pN0 cM0 (stage IIA) invasive mammary carcinoma of left outer breast status post lumpectomy and sentinel node  study on July 14, 2010  . CAD (coronary artery disease)   . Depression   . H/O hypokalemia   . History of chemotherapy   . History of MI (myocardial infarction)   . Hyperglycemia   . Hyperlipidemia   . Hypertension   . Peripheral neuropathy due to chemotherapy ([place])   . Personal history of chemotherapy 2011   left breast ca  . Personal history of radiation therapy 2001   left breast ca    Past Surgical History:  Procedure Laterality Date  . ABDOMINAL HYSTERECTOMY    . BREAST BIOPSY Left 2008   neg  . BREAST BIOPSY Left 03/05/2010   invasive mammary carcinoma  . BREAST LUMPECTOMY Left 07/14/2010   INVASIVE MAMMARY CARCINOMA WITH PROMINENT INFILTRATIVE PATTERN, negative margins  . CHOLECYSTECTOMY    . COLONOSCOPY WITH PROPOFOL N/A 11/22/2015   Procedure: COLONOSCOPY WITH PROPOFOL;  Surgeon: Hulen Luster, MD;  Location: [facility] ENDOSCOPY;  Service: Gastroenterology;  Laterality: N/A;  . CORONARY ANGIOPLASTY WITH STENT PLACEMENT  2008     reports that she quit smoking about 14 years ago. Her smoking use included cigarettes. She has a 6.25 pack-year smoking history. She has never used smokeless tobacco. She reports that she does not drink alcohol and does not use drugs.  Allergies  Allergen Reactions  . Tetanus Toxoids Shortness Of Breath and Rash  . Shellfish Allergy     Family History  Problem Relation Age of Onset  . Breast cancer Sister 36  . Hypertension Mother   . Aneurysm Mother   . Heart attack Sister   . Birth defects Sister       Prior to Admission medications   Medication Sig Start Date End Date Taking? Authorizing Provider  amLODipine (NORVASC) 10 MG tablet Take 1 tablet (10 mg total) by mouth daily. 07/29/20   Steele Sizer, MD  cephALEXin (KEFLEX) 500 MG capsule Take 1 capsule (500 mg total) by mouth 4 (four) times daily. 10/06/20   Cuthriell, Charline Bills, PA-C  cholecalciferol (VITAMIN D) 1000 units tablet Take 1,000 Units by mouth daily.    [provider]  enoxaparin (LOVENOX) 40 MG/0.4ML injection Inject 0.75 mLs (75 mg total) into the skin every 12 (twelve) hours. 10/04/20   Sharen Hones, MD  Lactulose 20 GM/30ML SOLN Take 30 mLs (20 g total) by mouth daily as needed. 10/04/20   Sharen Hones, MD  metoprolol succinate (TOPROL-XL) 25 MG 24 hr tablet Take 0.5 tablets (12.5 mg total) by mouth daily. 07/29/20   Steele Sizer, MD  ondansetron (ZOFRAN) 4 MG tablet Take 1 tablet (4 mg total) by mouth daily as needed for nausea or vomiting. 10/04/20 10/04/21  Sharen Hones, MD  oxyCODONE-acetaminophen (PERCOCET) 10-325 MG tablet Take 1 tablet by mouth every 6 (six) hours as needed for pain. 10/04/20   Sharen Hones, MD  PARoxetine (PAXIL) 10 MG tablet Take 1 tablet (10 mg total) by mouth daily. 09/13/20   Steele Sizer, MD  polyethylene glycol (MIRALAX / GLYCOLAX) 17 g packet Take 17 g by mouth 2 (two) times daily as needed. 10/04/20   Sharen Hones, MD  potassium chloride SA (KLOR-CON) 20 MEQ tablet 1 pill twice a day 08/08/20   Cammie Sickle, MD  rosuvastatin (CRESTOR) 10 MG tablet Take 1 tablet (10 mg total) by mouth daily. 09/04/20   Steele Sizer, MD  telmisartan (MICARDIS) 80 MG tablet  One daily 07/29/20   Steele Sizer, MD  tretinoin (RETIN-A) 0.025 % cream Apply topically at bedtime as needed and may repeat dose one time if needed. 10/23/17   [provider]    Physical Exam: Vitals:   10/09/20 0754 10/09/20 0758 10/09/20 0945 10/09/20 1000  BP:   130/74 140/69  Pulse:   89 87  Resp:    16  Temp:      TempSrc:      SpO2:  98% 97% 98%  Weight: 74.8 kg     Height: 5\' 4"  (1.626 m)        Vitals:   10/09/20 0754 10/09/20 0758 10/09/20 0945 10/09/20 1000  BP:   130/74 140/69  Pulse:   89 87  Resp:    16  Temp:      TempSrc:      SpO2:  98% 97% 98%  Weight: 74.8 kg     Height: 5\' 4"  (1.626 m)         Constitutional: Alert and oriented x 2 person and place.  Not to time.  Chronically ill-appearing and not in  any apparent distress HEENT:      Head: Normocephalic and atraumatic.         Eyes: PERLA, EOMI, Conjunctivae are normal. Sclera is non-icteric.       Mouth/Throat: Mucous membranes are moist.       Neck: Supple with no signs of meningismus. Cardiovascular: Regular rate and rhythm. No murmurs, gallops, or rubs. 2+ symmetrical distal pulses are present . No JVD. No LE edema Respiratory: Respiratory effort normal.  Left lung sounds clear.  Decreased air entry right lung base.  No wheezes, crackles, or rhonchi.  Gastrointestinal: Soft, diffusely tender,  distended with positive bowel sounds.  Genitourinary: No CVA tenderness. Musculoskeletal: Nontender with normal range of motion in all extremities. No cyanosis, or erythema of extremities. Neurologic:  Face is symmetric. Moving all extremities. No gross focal neurologic deficits  Skin: Skin is warm, dry.  No rash or ulcers Psychiatric: Mood and affect are normal   Labs on Admission: I have personally reviewed following labs and imaging studies  CBC: Recent Labs  Lab 10/03/20 1028 10/04/20 0449 10/06/20 1419 10/09/20 0821  WBC 11.7* 11.7* 16.6* 13.7*  NEUTROABS  --   --   --  11.0*  HGB 10.0* 9.6* 10.3* 10.0*  HCT 31.5* 29.3* 32.9* 32.4*  MCV 83.6 81.8 84.4 84.4  PLT 225 200 235 497   Basic Metabolic Panel: Recent Labs  Lab 10/03/20 1028 10/04/20 0449 10/06/20 1419 10/09/20 0821  NA 139 138 139 139  K 4.1 4.2 4.2 4.3  CL 107 109 108 107  CO2 22 20* 22 24  GLUCOSE 120* 99 132* 119*  BUN 19 19 24* 28*  CREATININE 0.71 0.64 0.72 0.70  CALCIUM 9.4 9.4 9.8 9.6   GFR: Estimated Creatinine Clearance: 61.1 mL/min (by C-G formula based on SCr of 0.7 mg/dL). Liver Function Tests: Recent Labs  Lab 10/03/20 1028 10/06/20 1419 10/09/20 0821  AST 22 28 25   ALT 9 12 13   ALKPHOS 98 93 97  BILITOT 1.0 0.9 0.6  PROT 6.8 7.0 6.8  ALBUMIN 2.9* 3.0* 3.0*   Recent Labs  Lab 10/03/20 1028 10/06/20 1419 10/09/20 0821  LIPASE  39 48 24   Recent Labs  Lab 10/03/20 1127 10/09/20 1005  AMMONIA 9 11   Coagulation Profile: Recent Labs  Lab 10/03/20 1408  INR 1.4*   Cardiac Enzymes: No results  for input(s): CKTOTAL, CKMB, CKMBINDEX, TROPONINI in the last 168 hours. BNP (last 3 results) No results for input(s): PROBNP in the last 8760 hours. HbA1C: No results for input(s): HGBA1C in the last 72 hours. CBG: No results for input(s): GLUCAP in the last 168 hours. Lipid Profile: No results for input(s): CHOL, HDL, LDLCALC, TRIG, CHOLHDL, LDLDIRECT in the last 72 hours. Thyroid Function Tests: No results for input(s): TSH, T4TOTAL, FREET4, T3FREE, THYROIDAB in the last 72 hours. Anemia Panel: No results for input(s): VITAMINB12, FOLATE, FERRITIN, TIBC, IRON, RETICCTPCT in the last 72 hours. Urine analysis:    Component Value Date/Time   COLORURINE AMBER (A) 10/09/2020 0857   APPEARANCEUR CLOUDY (A) 10/09/2020 0857   LABSPEC 1.031 (H) 10/09/2020 0857   PHURINE 5.0 10/09/2020 [place] 10/09/2020 0857   HGBUR NEGATIVE 10/09/2020 0857   BILIRUBINUR NEGATIVE 10/09/2020 0857   KETONESUR 5 (A) 10/09/2020 0857   PROTEINUR 30 (A) 10/09/2020 0857   NITRITE NEGATIVE 10/09/2020 0857   LEUKOCYTESUR TRACE (A) 10/09/2020 0857    Radiological Exams on Admission: CT Head W or Wo Contrast  Result Date: 10/09/2020 CLINICAL DATA:  Mental status change EXAM: CT HEAD WITHOUT AND WITH CONTRAST TECHNIQUE: Contiguous axial images were obtained from the base of the skull through the vertex without and with intravenous contrast CONTRAST:  169mL OMNIPAQUE IOHEXOL 300 MG/ML  SOLN COMPARISON:  10/03/2020 FINDINGS: Brain: There is no acute intracranial hemorrhage, mass, mass effect, or edema. No abnormal enhancement. Gray-white differentiation is preserved. There is no extra-axial fluid collection. Ventricles and sulci are stable in size and configuration. Patchy hypoattenuation in the supratentorial white matter is  nonspecific but likely reflects stable mild chronic microvascular ischemic changes. Vascular: There is atherosclerotic calcification at the skull base. Skull: Calvarium is unremarkable. Sinuses/Orbits: No acute finding. Other: None. IMPRESSION: No acute intracranial abnormality.  No abnormal enhancement. Electronically Signed   By: Macy Mis M.D.   On: 10/09/2020 09:55   CT ABDOMEN PELVIS W CONTRAST  Result Date: 10/09/2020 CLINICAL DATA:  Confusion, abdominal bloating, pain EXAM: CT ABDOMEN AND PELVIS WITH CONTRAST TECHNIQUE: Multidetector CT imaging of the abdomen and pelvis was performed using the standard protocol following bolus administration of intravenous contrast. CONTRAST:  124mL OMNIPAQUE IOHEXOL 300 MG/ML  SOLN COMPARISON:  10/06/2020 FINDINGS: Lower chest: Moderate right pleural effusion. Compressive atelectasis in the right lower lobe. Stable mass in a right lower costochondral junction is unchanged. Postsurgical changes in the left breast. This is unchanged. Hepatobiliary: No change in the multiple masses throughout the liver presumably metastases. Dilated and thrombosed portal vein again noted with cavernous transformation, stable. Pancreas: Stable dilated pancreatic duct. Stable pancreatic body and pancreatic tail masses. Spleen: No focal abnormality.  Normal size. Adrenals/Urinary Tract: Stable low-density lesion off the upper pole of the left kidney measuring 1.8 cm. No hydronephrosis. Adrenal glands and urinary bladder unremarkable. Stomach/Bowel: Moderate stool throughout the colon. No evidence of bowel obstruction. Vascular/Lymphatic: Aortic atherosclerosis. Upper abdominal adenopathy again noted, the largest a portacaval node measuring up to 3.9 cm. Retroperitoneal adenopathy, stable. Reproductive: Prior hysterectomy Other: Large volume ascites in the abdomen and pelvis. Nodularity throughout the mesentery and omentum, most pronounced in the right abdomen and upper abdomen most likely  reflects malignant ascites and peritoneal spread of disease. This may also be reflected within umbilical nodule, stable. Musculoskeletal: No acute bony abnormality or focal lesion. IMPRESSION: Extensive metastatic disease throughout the abdomen as described above. Findings stable since prior study. Chronic portal vein thrombosis with  cavernous transformation, stable. Upper abdominal and retroperitoneal adenopathy, stable. Large volume ascites, likely malignant ascites, stable. No evidence of bowel obstruction. Stable moderate right pleural effusion with right lower lobe atelectasis. Electronically Signed   By: Rolm Baptise M.D.   On: 10/09/2020 10:15   DG Chest Portable 1 View  Result Date: 10/09/2020 CLINICAL DATA:  Altered mental status.  Rule out pneumonia EXAM: PORTABLE CHEST 1 VIEW COMPARISON:  10/04/2020 FINDINGS: Progression of right lower lobe airspace disease.  No effusion Left lung remains clear.  Negative for heart failure. IMPRESSION: Progression of right lower lobe airspace disease which may represent atelectasis or pneumonia. Electronically Signed   By: Franchot Gallo M.D.   On: 10/09/2020 10:11     Assessment/Plan Principal Problem:   Abdominal pain Active Problems:   Anxiety disorder   Benign essential HTN   Malignant ascites    Abdominal pain concerning for possible SBP Most likely secondary to extensive metastatic disease noted throughout the abdomen Patient had a recent biopsy done of the right side chest wall mass consistent with adenocarcinoma of the pancreas She also has large volume ascites most likely metastatic We will request consult for ultrasound-guided paracentesis Start patient empirically on Rocephin 2 g IV daily Follow-up results of fluid studies Pain control    Newly diagnosed metastatic adenocarcinoma of the pancreas Consult oncology   Hypertension Continue ibesartan, metoprolol and amlodipine   Depression/anxiety Continue Paxil   History of  portal vein thrombosis Hold Lovenox for planned procedure  DVT prophylaxis: SCD Code Status: full code Family Communication: Greater than 50% of time spent discussing patient's condition at the bedside.  All questions and concerns have been addressed.  She verbalizes understanding and agrees with the plan.  She lists her daughter Nereida Schepp as her healthcare power of attorney. Disposition Plan: Back to previous home environment Consults called: Oncology Status: At the time of admission, it appears that the appropriate admission status for this patient is inpatient.  This is judged to be reasonable and necessary in order to provide the required intensity of service to ensure the patient's safety given the presenting symptoms, physical exam findings, and initial radiographic and laboratory data in the context of their comorbid conditions. Patient requires inpatient status due to high intensity of service, high risk for further deterioration and high frequency of surveillance required.    Collier Bullock MD [facility]     10/09/2020, 12:50 PM

## 2020-10-09 NOTE — Progress Notes (Signed)
START OFF PATHWAY REGIMEN - Other   [id number]:Carboplatin AUC=6 IV D1 + Paclitaxel 175 mg/m2 IV D1 q21 Days:   A cycle is every 21 days:     Paclitaxel      Carboplatin   **Always confirm dose/schedule in your pharmacy ordering system**  Patient Characteristics: Intent of Therapy: Non-Curative / Palliative Intent, Discussed with Patient

## 2020-10-09 NOTE — ED Notes (Signed)
Pt stuck for second set of cultures but unable to obtain. Will opt to start antibiotics instead.

## 2020-10-09 NOTE — ED Notes (Signed)
Taken to CT.

## 2020-10-09 NOTE — ED Notes (Signed)
Pt assisted into hospital bed for comfort. Denies further needs at this time.

## 2020-10-09 NOTE — Assessment & Plan Note (Addendum)
#  75 year old female patient with remote history of breast cancer; and history of portal vein thrombosis unclear etiology-on lovenox; new diagnosis of metastatic carcinoma-unknown primary is currently admitted to hospital for worsening abdominal pain/ascites.  #Carcinoma of unknown primary-stage IV -possible pancreatic versus left breast/recurrence-currently awaiting ER/PR HER-2/neu status on the chest wall soft tissue specimen.  However given the bulky disease symptomatic disease-proceed with chemotherapy carbotaxol.  Patient understands that treatments are palliative not curative.  #Worsening abdominal pain/ascites-status post paracentesis improved.  No evidence of infection.  Defer to primary team for discontinuation of ceftriaxone.  Discussed with Dr. Dwyane Dee.  #Left breast mass-question recurrent malignancy-we will plan outpatient biopsy.  #Progressive portal vein thrombosis-likely secondary to underlying malignancy.  Continue Lovenox 1 mg/kg SQ twice daily  #Given the metastatic disease-recommend palliative care evaluation discussed with Josh Borders.   #Discussed with patient and her daughter, Mickel Baas over the phone regarding above plan of care.  They understand the potential risk versus benefits of chemotherapy; and elected proceed with chemotherapy.  Discussed with pharmacist/chemotherapy nurse at the cancer center.  If patient is clinically stable pain under control patient will be discharged home tomorrow- 3/18.  Recommend patient keep her appointment as planned on 3/21-at the cancer center.  Dr. Janese Banks will be available for any questions or concerns.  # 40 minutes face-to-face with the patient discussing the above plan of care; more than 50% of time spent on prognosis/ natural history; counseling and coordination.

## 2020-10-09 NOTE — Progress Notes (Signed)
Patient arrived to the unit from ED, assessment completed, family at the bedside, & no acute changes.

## 2020-10-09 NOTE — Consult Note (Signed)
[place] NOTE  Patient Care Team: Steele Sizer, MD as PCP - General (Family Medicine) Cammie Sickle, MD as Consulting Physician (Oncology) Corey Skains, MD as Consulting Physician (Cardiology) Solum, Betsey Holiday, MD as Consulting Physician (Endocrinology) Baxter Kail, MD as Consulting Physician (Dermatology) Anell Barr, OD as Consulting Physician (Optometry) Pieter Partridge, MD as Referring Physician (Dermatology) Vern Claude, LCSW as Social Worker Neldon Labella, RN as Registered Nurse Michaelle Birks, Glean Salvo, [facility] (Pharmacist)  CHIEF COMPLAINTS/PURPOSE OF CONSULTATION: Metastatic cancer  HISTORY OF PRESENTING ILLNESS:  Theresa Andrews 75 y.o.  female patient with remote history of breast cancer stage II ER/PR positive HER-2 negative; and also portal venous thrombosis on Lovenox-was recently admitted to hospital for worsening abdominal pain when imaging showed concerning lesions for metastatic malignancy in the abdomen.  Patient underwent biopsy of the soft tissue anterior chest wall-positive for adenocarcinoma.  However patient returns back to the hospital because of worsening abdominal pain/constipation.  Also concerned about mental status changes/confusion.  Patient also noted to have worsening abdominal pain; abdominal distention associated constipation.   Given the worsening mental status changes worsening abdominal pain patient is currently admitted to hospital.  CT scan shows-worsening ascites; with stable liver lesions pancreatic lesion omental metastases.  White count slightly elevated at 13 hemoglobin 10 platelets normal.  Patient renal function is normal.  Calcium is normal.  Review of Systems  Constitutional: Positive for malaise/fatigue and weight loss. Negative for chills, diaphoresis and fever.  HENT: Negative for nosebleeds and sore throat.   Eyes: Negative for double vision.  Respiratory: Negative for cough, hemoptysis, sputum  production, shortness of breath and wheezing.   Cardiovascular: Negative for chest pain, palpitations, orthopnea and leg swelling.  Gastrointestinal: Positive for abdominal pain and constipation. Negative for blood in stool, diarrhea, heartburn, melena, nausea and vomiting.  Genitourinary: Negative for dysuria, frequency and urgency.  Musculoskeletal: Negative for back pain and joint pain.  Skin: Negative.  Negative for itching and rash.  Neurological: Positive for weakness. Negative for dizziness, tingling, focal weakness and headaches.  Endo/Heme/Allergies: Does not bruise/bleed easily.  Psychiatric/Behavioral: Negative for depression. The patient is not nervous/anxious and does not have insomnia.      MEDICAL HISTORY:  Past Medical History:  Diagnosis Date  . Anxiety   . Breast cancer ([place])    pT2 pN0 cM0 (stage IIA) invasive mammary carcinoma of left outer breast status post lumpectomy and sentinel node study on July 14, 2010  . CAD (coronary artery disease)   . Depression   . H/O hypokalemia   . History of chemotherapy   . History of MI (myocardial infarction)   . Hyperglycemia   . Hyperlipidemia   . Hypertension   . Peripheral neuropathy due to chemotherapy ([place])   . Personal history of chemotherapy 2011   left breast ca  . Personal history of radiation therapy 2001   left breast ca    SURGICAL HISTORY: Past Surgical History:  Procedure Laterality Date  . ABDOMINAL HYSTERECTOMY    . BREAST BIOPSY Left 2008   neg  . BREAST BIOPSY Left 03/05/2010   invasive mammary carcinoma  . BREAST LUMPECTOMY Left 07/14/2010   INVASIVE MAMMARY CARCINOMA WITH PROMINENT INFILTRATIVE PATTERN, negative margins  . CHOLECYSTECTOMY    . COLONOSCOPY WITH PROPOFOL N/A 11/22/2015   Procedure: COLONOSCOPY WITH PROPOFOL;  Surgeon: Hulen Luster, MD;  Location: [facility] ENDOSCOPY;  Service: Gastroenterology;  Laterality: N/A;  . CORONARY ANGIOPLASTY WITH STENT PLACEMENT  2008  SOCIAL  HISTORY: Social History   Socioeconomic History  . Marital status: Divorced    Spouse name: Not on file  . Number of children: 3  . Years of education: some college, no degree  . Highest education level: 12th grade  Occupational History    Employer: RETIRED  Tobacco Use  . Smoking status: Former Smoker    Packs/day: 0.25    Years: 25.00    Pack years: 6.25    Types: Cigarettes    Quit date: 2008    Years since quitting: 14.2  . Smokeless tobacco: Never Used  . Tobacco comment: smoking cessation materials not required  Vaping Use  . Vaping Use: Never used  Substance and Sexual Activity  . Alcohol use: No    Comment: rare; holidays  . Drug use: No  . Sexual activity: Not Currently  Other Topics Concern  . Not on file  Social History Narrative   Pt lives alone   Social Determinants of Health   Financial Resource Strain: Low Risk   . Difficulty of Paying Living Expenses: Not hard at all  Food Insecurity: No Food Insecurity  . Worried About Charity fundraiser in the Last Year: Never true  . Ran Out of Food in the Last Year: Never true  Transportation Needs: No Transportation Needs  . Lack of Transportation (Medical): No  . Lack of Transportation (Non-Medical): No  Physical Activity: Inactive  . Days of Exercise per Week: 0 days  . Minutes of Exercise per Session: 0 min  Stress: No Stress Concern Present  . Feeling of Stress : Not at all  Social Connections: Socially Isolated  . Frequency of Communication with Friends and Family: More than three times a week  . Frequency of Social Gatherings with Friends and Family: Three times a week  . Attends Religious Services: Never  . Active Member of Clubs or Organizations: No  . Attends Archivist Meetings: Never  . Marital Status: Divorced  Human resources officer Violence: Not At Risk  . Fear of Current or Ex-Partner: No  . Emotionally Abused: No  . Physically Abused: No  . Sexually Abused: No    FAMILY  HISTORY: Family History  Problem Relation Age of Onset  . Breast cancer Sister 1  . Hypertension Mother   . Aneurysm Mother   . Heart attack Sister   . Birth defects Sister     ALLERGIES:  is allergic to tetanus toxoids and shellfish allergy.  MEDICATIONS:  Current Facility-Administered Medications  Medication Dose Route Frequency Provider Last Rate Last Admin  . amLODipine (NORVASC) tablet 10 mg  10 mg Oral Daily Agbata, Tochukwu, MD   10 mg at 10/09/20 1224  . cefTRIAXone (ROCEPHIN) 2 g in sodium chloride 0.9 % 100 mL IVPB  2 g Intravenous Q24H Agbata, Tochukwu, MD   Stopped at 10/09/20 1255  . cholecalciferol (VITAMIN D3) tablet 1,000 Units  1,000 Units Oral Daily Agbata, Tochukwu, MD   1,000 Units at 10/09/20 1224  . irbesartan (AVAPRO) tablet 300 mg  300 mg Oral Daily Agbata, Tochukwu, MD   300 mg at 10/09/20 1224  . lactulose (CHRONULAC) 10 GM/15ML solution 20 g  20 g Oral Daily PRN Agbata, Tochukwu, MD      . metoprolol succinate (TOPROL-XL) 24 hr tablet 12.5 mg  12.5 mg Oral Daily Agbata, Tochukwu, MD   12.5 mg at 10/09/20 1224  . morphine 2 MG/ML injection 2 mg  2 mg Intravenous Q4H PRN Collier Bullock, MD  2 mg at 10/09/20 1844  . ondansetron (ZOFRAN) tablet 4 mg  4 mg Oral Q6H PRN Agbata, Tochukwu, MD       Or  . ondansetron (ZOFRAN) injection 4 mg  4 mg Intravenous Q6H PRN Agbata, Tochukwu, MD      . PARoxetine (PAXIL) tablet 10 mg  10 mg Oral Daily Agbata, Tochukwu, MD   10 mg at 10/09/20 1224  . polyethylene glycol (MIRALAX / GLYCOLAX) packet 17 g  17 g Oral Daily Agbata, Tochukwu, MD   17 g at 10/09/20 1224  . rosuvastatin (CRESTOR) tablet 10 mg  10 mg Oral Daily Agbata, Tochukwu, MD   10 mg at 10/09/20 1224      .  PHYSICAL EXAMINATION:  Vitals:   10/09/20 1720 10/09/20 2005  BP: 107/66 111/70  Pulse: 98 88  Resp: 18 18  Temp: 97.8 F (36.6 C) 98.5 F (36.9 C)  SpO2: 100% 98%   Filed Weights   10/09/20 0754  Weight: 165 lb (74.8 kg)    Physical  Exam Constitutional:      Comments: Sick appearing elderly female patient.  She is accompanied by her daughter.  HENT:     Head: Normocephalic and atraumatic.     Mouth/Throat:     Pharynx: No oropharyngeal exudate.  Eyes:     Pupils: Pupils are equal, round, and reactive to light.  Cardiovascular:     Rate and Rhythm: Normal rate and regular rhythm.  Pulmonary:     Effort: No respiratory distress.     Breath sounds: No wheezing.     Comments: Decreased air entry bilaterally. Abdominal:     General: Bowel sounds are normal. There is no distension.     Palpations: Abdomen is soft. There is no mass.     Tenderness: There is no abdominal tenderness. There is no guarding or rebound.     Comments: Abdominal distention; positive ascites.   Musculoskeletal:        General: No tenderness. Normal range of motion.     Cervical back: Normal range of motion and neck supple.  Skin:    General: Skin is warm.  Neurological:     Mental Status: She is alert and oriented to person, place, and time.  Psychiatric:        Mood and Affect: Affect normal.      LABORATORY DATA:  I have reviewed the data as listed Lab Results  Component Value Date   WBC 13.7 (H) 10/09/2020   HGB 10.0 (L) 10/09/2020   HCT 32.4 (L) 10/09/2020   MCV 84.4 10/09/2020   PLT 265 10/09/2020   Recent Labs    04/25/20 0852 05/29/20 1344 08/08/20 0000 08/08/20 0958 10/03/20 1028 10/04/20 0449 10/06/20 1419 10/09/20 0821  NA 141 142 141   < > 139 138 139 139  K 2.8* 3.2* 3.5   < > 4.1 4.2 4.2 4.3  CL 103 106 105   < > 107 109 108 107  CO2 _0 < > 22 20* 22 24  GLUCOSE 139* 115* 129*   < > 120* 99 132* 119*  BUN 7* 9 9   < > 19 19 24* 28*  CREATININE 0.66 0.61 0.63   < > 0.71 0.64 0.72 0.70  CALCIUM 9.3 10.1 10.3   < > 9.4 9.4 9.8 9.6  GFRNONAA >60 90 89   < > >60 >60 >60 >60  GFRAA >60 104 103  --   --   --   --   --  PROT 7.3 6.6 7.3   < > 6.8  --  7.0 6.8  ALBUMIN 3.6 3.9  --    < > 2.9*  --   3.0* 3.0*  AST _0 < > 22  --  28 25  ALT _1 < > 9  --  12 13  ALKPHOS 101 131*  --    < > 98  --  93 97  BILITOT 0.9 0.4 0.4   < > 1.0  --  0.9 0.6   < > = values in this interval not displayed.    RADIOGRAPHIC STUDIES: I have personally reviewed the radiological images as listed and agreed with the findings in the report. DG Chest 2 View  Result Date: 10/04/2020 CLINICAL DATA:  Status post right chest wall mass biopsy EXAM: CHEST - 2 VIEW COMPARISON:  Chest radiograph March 26, 2010 FINDINGS: The heart size and mediastinal contours are within normal limits. Calcifications aortic arch. No visible pneumothorax. Right lower lobe airspace opacity. Small right pleural effusion. No visible pneumothorax. Thoracic spondylosis. IMPRESSION: Right lower lobe atelectasis with right pleural effusion. No visible pneumothorax. Electronically Signed   By: Dahlia Bailiff MD   On: 10/04/2020 12:36   CT Head Wo Contrast  Result Date: 10/03/2020 CLINICAL DATA:  Headache, intracranial hemorrhage suspected EXAM: CT HEAD WITHOUT CONTRAST TECHNIQUE: Contiguous axial images were obtained from the base of the skull through the vertex without intravenous contrast. COMPARISON:  10/01/2020 FINDINGS: Brain: No evidence of acute infarction, hemorrhage, hydrocephalus, extra-axial collection or mass lesion/mass effect. Mild periventricular and deep white matter hypodensity. Vascular: No hyperdense vessel or unexpected calcification. Skull: Normal. Negative for fracture or focal lesion. Sinuses/Orbits: No acute finding. Other: None. IMPRESSION: 1. No acute intracranial pathology. No non-contrast CT findings to explain headache. Specifically, no evidence of intracranial hemorrhage. 2.  Small-vessel white matter disease. Electronically Signed   By: Eddie Candle M.D.   On: 10/03/2020 12:06   CT Head Wo Contrast  Result Date: 10/01/2020 CLINICAL DATA:  Altered mental status, unsteady gait EXAM: CT HEAD WITHOUT  CONTRAST TECHNIQUE: Contiguous axial images were obtained from the base of the skull through the vertex without intravenous contrast. COMPARISON:  None. FINDINGS: Brain: No evidence of acute infarction, hemorrhage, hydrocephalus, extra-axial collection or mass lesion/mass effect. Scattered low-density changes within the periventricular and subcortical white matter compatible with chronic microvascular ischemic change. Mild diffuse cerebral volume loss. Vascular: Atherosclerotic calcifications involving the large vessels of the skull base. No unexpected hyperdense vessel. Skull: Normal. Negative for fracture or focal lesion. Sinuses/Orbits: No acute finding. Other: None. IMPRESSION: 1. No acute intracranial findings. 2. Mild chronic microvascular ischemic change and cerebral volume loss. Electronically Signed   By: Davina Poke D.O.   On: 10/01/2020 16:28   CT Head W or Wo Contrast  Result Date: 10/09/2020 CLINICAL DATA:  Mental status change EXAM: CT HEAD WITHOUT AND WITH CONTRAST TECHNIQUE: Contiguous axial images were obtained from the base of the skull through the vertex without and with intravenous contrast CONTRAST:  179m OMNIPAQUE IOHEXOL 300 MG/ML  SOLN COMPARISON:  10/03/2020 FINDINGS: Brain: There is no acute intracranial hemorrhage, mass, mass effect, or edema. No abnormal enhancement. Gray-white differentiation is preserved. There is no extra-axial fluid collection. Ventricles and sulci are stable in size and configuration. Patchy hypoattenuation in the supratentorial white matter is nonspecific but likely reflects stable mild chronic microvascular ischemic changes. Vascular: There is atherosclerotic calcification at the skull base. Skull: Calvarium  is unremarkable. Sinuses/Orbits: No acute finding. Other: None. IMPRESSION: No acute intracranial abnormality.  No abnormal enhancement. Electronically Signed   By: Macy Mis M.D.   On: 10/09/2020 09:55   CT ABDOMEN PELVIS W CONTRAST  Result  Date: 10/09/2020 CLINICAL DATA:  Confusion, abdominal bloating, pain EXAM: CT ABDOMEN AND PELVIS WITH CONTRAST TECHNIQUE: Multidetector CT imaging of the abdomen and pelvis was performed using the standard protocol following bolus administration of intravenous contrast. CONTRAST:  182m OMNIPAQUE IOHEXOL 300 MG/ML  SOLN COMPARISON:  10/06/2020 FINDINGS: Lower chest: Moderate right pleural effusion. Compressive atelectasis in the right lower lobe. Stable mass in a right lower costochondral junction is unchanged. Postsurgical changes in the left breast. This is unchanged. Hepatobiliary: No change in the multiple masses throughout the liver presumably metastases. Dilated and thrombosed portal vein again noted with cavernous transformation, stable. Pancreas: Stable dilated pancreatic duct. Stable pancreatic body and pancreatic tail masses. Spleen: No focal abnormality.  Normal size. Adrenals/Urinary Tract: Stable low-density lesion off the upper pole of the left kidney measuring 1.8 cm. No hydronephrosis. Adrenal glands and urinary bladder unremarkable. Stomach/Bowel: Moderate stool throughout the colon. No evidence of bowel obstruction. Vascular/Lymphatic: Aortic atherosclerosis. Upper abdominal adenopathy again noted, the largest a portacaval node measuring up to 3.9 cm. Retroperitoneal adenopathy, stable. Reproductive: Prior hysterectomy Other: Large volume ascites in the abdomen and pelvis. Nodularity throughout the mesentery and omentum, most pronounced in the right abdomen and upper abdomen most likely reflects malignant ascites and peritoneal spread of disease. This may also be reflected within umbilical nodule, stable. Musculoskeletal: No acute bony abnormality or focal lesion. IMPRESSION: Extensive metastatic disease throughout the abdomen as described above. Findings stable since prior study. Chronic portal vein thrombosis with cavernous transformation, stable. Upper abdominal and retroperitoneal adenopathy,  stable. Large volume ascites, likely malignant ascites, stable. No evidence of bowel obstruction. Stable moderate right pleural effusion with right lower lobe atelectasis. Electronically Signed   By: KRolm BaptiseM.D.   On: 10/09/2020 10:15   CT ABDOMEN PELVIS W CONTRAST  Result Date: 10/06/2020 CLINICAL DATA:  Abdominal swelling, vomiting, history of left breast cancer, metastatic disease on previous CT EXAM: CT ABDOMEN AND PELVIS WITH CONTRAST TECHNIQUE: Multidetector CT imaging of the abdomen and pelvis was performed using the standard protocol following bolus administration of intravenous contrast. CONTRAST:  1073mOMNIPAQUE IOHEXOL 300 MG/ML  SOLN COMPARISON:  10/03/2020 FINDINGS: Lower chest: Stable right pleural effusion and right lower lobe atelectasis. The soft tissue mass at the right anterior sixth costochondral junction is unchanged, please correlate with recent biopsy results. Chronic postsurgical changes left breast. Hepatobiliary: No change in the multiple confluent hypodense masses within the liver consistent with presumed metastases. Dilated thrombosed portal vein again noted. Pancreas: There is marked pancreatic parenchymal atrophy. There are multiple pancreatic masses identified. In the pancreatic tail hypodense mass measures approximately 2.4 x 1.8 cm image 24/2. Hypodense mass in the dorsal aspect of the pancreatic body image 28/2 measures 2.2 x 1.9 cm, with hyperdense margins. Pancreatic duct dilation unchanged. Spleen: Spleen demonstrates normal size without focal abnormality. Splenic vein is patent. Adrenals/Urinary Tract: Indeterminate 1.8 cm hypodensity upper pole left kidney with Hounsfield attenuation of 43, stable. No other focal renal abnormalities. No renal tract calculi or obstructive uropathy. The bladder is decompressed with no focal abnormalities. The adrenals are normal. Stomach/Bowel: There is moderate gas and stool throughout the colon which may reflect an element of  constipation. No evidence of bowel obstruction or ileus. Vascular/Lymphatic: Extensive atherosclerosis of the aorta  is again noted unchanged. Chronic portal vein thrombosis with cavernous transformation again seen. The splenic vein and superior mesenteric vein are patent. Pathologic adenopathy is seen throughout the upper abdomen. 10 mm lymph node in the porta hepatis image 25/2 is stable. Multiple enlarged lymph nodes are seen within the gastrohepatic ligament, largest measuring 11 mm in short axis image 23/2. Necrotic lymph node dorsal to the pancreatic body image 28/2 measures 19 mm in short axis. Aortocaval lymph node image 36/2 measures 10 mm in short axis. Reproductive: Status post hysterectomy. No adnexal masses. Other: Moderate abdominal and pelvic ascites, without appreciable change since prior study. There is some soft tissue nodularity within the right upper quadrant mesentery, just inferior to the liver and along the hepatic flexure of the colon, concerning for peritoneal implant. No free intraperitoneal gas. Stable umbilical hernia containing mesenteric fat. Inflammatory changes within the hernia may suggest incarceration. Musculoskeletal: No acute or destructive bony lesions. Reconstructed images demonstrate no additional findings. IMPRESSION: 1. Extensive intra-abdominal and intrapelvic metastases, without significant change since prior study. Given the above findings, this may reflect primary pancreatic adenocarcinoma with diffuse metastases. Correlation with recent biopsy results recommended. No significant change in the appearance of the metastatic disease since prior study. 2. Moderate ascites, without significant change since prior study. This is likely malignant ascites given the soft tissue nodularity within the right upper quadrant mesentery. 3. Chronic portal vein thrombosis with cavernous transformation. 4. No evidence of bowel obstruction or ileus. Retained gas and stool throughout the  colon may reflect constipation. 5. Stable right pleural effusion. 6.  Aortic Atherosclerosis (ICD10-I70.0). Electronically Signed   By: Randa Ngo M.D.   On: 10/06/2020 19:51   CT ABDOMEN PELVIS W CONTRAST  Addendum Date: 10/03/2020   ADDENDUM REPORT: 10/03/2020 14:10 ADDENDUM: ,There is soft tissue fullness along the anterior right lower rib cartilage, including on 14/2. This is new since 02/19/2020, also highly suspicious for metastatic disease. Electronically Signed   By: Abigail Miyamoto M.D.   On: 10/03/2020 14:10   Result Date: 10/03/2020 CLINICAL DATA:  Right-sided abdominal pain for about a week with nausea and vomiting x2. Constipation. Remote breast cancer history. EXAM: CT ABDOMEN AND PELVIS WITH CONTRAST TECHNIQUE: Multidetector CT imaging of the abdomen and pelvis was performed using the standard protocol following bolus administration of intravenous contrast. CONTRAST:  187m OMNIPAQUE IOHEXOL 300 MG/ML  SOLN COMPARISON:  Multiple priors, including MRCP of 09/26/2020, abdominal CT of 02/19/2020, abdominal MRI 03/07/2020. FINDINGS: Lower chest: Right base atelectasis. Small right pleural effusion is new since the prior CT. Normal heart size with multivessel coronary artery atherosclerosis. Left-sided calcification or surgical clips. Deep to the presumed lumpectomy site is a centrally hypoattenuating lesion of 2.8 x 2.2 cm on 05/02. This is likely not imaged on the prior CT, but present on the 03/07/2020 MRI where it was grossly similar. Hepatobiliary: Multiple hepatic masses are again identified. A high right hepatic lobe lesion measures 2.6 cm on 13/2 versus 1.8 cm on 02/19/2020. A wedge-shaped mass within segments 4 and 2 of the liver measures on the order of 10.5 x 5.1 cm on 19/2. Significantly enlarged from 02/19/2020, where lesions in this region measured up to 3.0 cm. Increased from on the order of 3.3 cm on 03/07/2020. Cholecystectomy without biliary duct dilatation. Pancreas: The pancreas  demonstrates similar atrophy with moderate duct dilatation at 9 mm on 29/2. Duct dilatation is followed to the level of the pancreatic head, with there is mild soft tissue fullness but no  well-defined mass. Example 34/2. This is unchanged. The pancreatic tail demonstrates developing relative soft tissue fullness compared to the pancreatic body. Example at on the order of 2.3 cm in thickness on 25/2 versus 1.4 cm on 02/19/2020. Spleen: Normal in size, without focal abnormality. Adrenals/Urinary Tract: Normal adrenal glands. Upper pole left renal lesion of 1.6 cm is greater than fluid density but was determined a complex cyst on prior MRI. Normal right kidney. Contrast in the urinary bladder secondary to reported outside CT performed yesterday. Stomach/Bowel: Tiny hiatal hernia. Normal distal stomach. Moderate amount of stool within the rectum. Scattered colonic diverticula. Normal terminal ileum. Normal small bowel caliber. Vascular/Lymphatic: Aortic atherosclerosis. Diffuse portal vein thrombosis again identified. Thrombus involves the superior aspect of the superior mesenteric vein, chronic. New and progressive abdominal adenopathy. An aortocaval node measures 1.0 cm on 39/2 versus 7 mm on 02/19/2020 (when remeasured). A necrotic node along the celiac, just posterior to the pancreatic body measures 1.9 cm on 30/2 and is new. Developing adenopathy in the gastrohepatic ligament including at 1.0 cm on 24/2. More ill-defined hypoattenuating lesions in the peripancreatic space, including at 2.6 cm in the portacaval space on 29/2, increased from 2.3 cm on 02/19/2020. No pelvic sidewall adenopathy. Reproductive: Hysterectomy.  No adnexal mass. Other: Since 02/19/2020, development of small volume abdominopelvic ascites. Peritoneal nodularity, including within the pelvic cul-de-sac on 78/2 and along the right-side of the abdomen on 39/2. No free intraperitoneal air. A new periumbilical nodule or node measures 1.9 cm on  57/2. Musculoskeletal: No acute osseous abnormality. Trace L4-5 anterolisthesis. Degenerate disc disease at the lumbosacral junction. IMPRESSION: 1. When compared to multiple prior exams, including most direct comparison to the 02/19/2020 CT, new or progressive metastatic disease, most definite involving necrotic abdominal nodes and the peritoneum as detailed above. Developing periumbilical nodularity, also likely metastatic. 2. Progressive liver lesions, also most likely indicative of metastatic disease. In the setting of portal vein thrombosis, progressive abscesses are possible but less likely. 3. Abnormal appearance of the pancreas, at least partially felt to be attributed to chronic pancreatitis. Developing soft tissue fullness within the pancreatic tail, with considerations of primary adenocarcinoma, metastatic disease, or superimposed acute pancreatitis. Peripancreatic hypoattenuating lesions, some of which are increased compared to 02/19/2020. These could be secondary to prior pancreatitis or also represent necrotic nodal metastasis. 4. Left breast mass in the setting of prior lumpectomy for carcinoma. Suspicious for new breast primary, incompletely imaged. 5.  Possible constipation. 6. New small volume abdominopelvic ascites. 7. Similar small right pleural effusion compared to the MRI of 09/26/2020. Electronically Signed: By: Abigail Miyamoto M.D. On: 10/03/2020 12:32   MR ABDOMEN MRCP WO CONTRAST  Result Date: 09/27/2020 CLINICAL DATA:  Pancreatitis. Pancreatic and liver lesions on prior MRI. Abdominal pain. EXAM: MRI ABDOMEN WITHOUT CONTRAST  (INCLUDING MRCP) TECHNIQUE: Multiplanar multisequence MR imaging of the abdomen was performed. Heavily T2-weighted images of the biliary and pancreatic ducts were obtained, and three-dimensional MRCP images were rendered by post processing. COMPARISON:  03/07/2020 MRI FINDINGS: Despite efforts by the technologist and patient, severe motion artifact is present on  today's exam and could not be eliminated. This reduces exam sensitivity and specificity. Due to pain, the patient terminated the exam prior to obtaining full diagnostic sequences. Lower chest: Small right pleural effusion is new compared to prior MRI. Hepatobiliary: There is been evolution of the previous regions of stippled T2 signal hyperintensity scattered in the liver to current appearance of hazy accentuated T2 signal for example on image 9  of series 4. On biopsy, these were felt to represent areas of periductal fibrosis, chronic lymphoplasmacytic infiltrate, hemosiderin deposition, and regenerative hyperplasia suggesting finding secondary to portal vein thrombosis. This process is significantly expanded throughout segment 4a and 4B of the liver which not demonstrates diffuse hazy accentuated T2 signal as shown on image 14 series 4. There is associated reduced T1 signal in these regions of involvement. No steatosis. There is some associated restriction of diffusion in these regions of involvement. Unfortunately the MRCP images are severely blurred. No obvious extrahepatic biliary dilatation. There is some mild narrowing of the common hepatic duct along the porta hepatis for example on image 16 series 3, probably due to surrounding soft tissue swelling and possibly periportal fibrosis. As before, there is high T2 signal intensity material throughout the somewhat dilated intrahepatic portal venous system probably from thrombosis. Periportal edema noted. Pancreas: Peripancreatic edema noted with stippled accentuated T2 signal in the pancreas, and substantially dilated segments of the dorsal pancreatic duct especially in the pancreatic body, up to 1.0 cm in diameter. T2 signal hyperintensity partially involving the pancreatic head measuring about 4.5 by 1.6 by 2.6 cm, extending cephalad along the porta hepatis, I am uncertain whether this represents a thrombosed varix from the portal vein, pseudocyst, or a cystic  pancreatic neoplasm. This appears roughly similar to the prior exam. Spleen:  Unremarkable Adrenals/Urinary Tract: Small renal lesions of varying complexity are present and probably a combination of simple and complex cysts, although enhancement characteristics are not interrogated today. The adrenal glands appear normal. Stomach/Bowel: Unremarkable Vascular/Lymphatic: Aortoiliac atherosclerotic vascular disease. 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 Collaterals along the porta hepatis. Other: Ascites particularly along the paracolic gutters and in the pelvis (pelvic ascites seen on coronal images). Mesenteric edema mildly improved in the central mesentery compared to previous. Omental edema noted. There is hazy stranding in the root of the mesentery as on prior exams. Musculoskeletal: Lower lumbar spondylosis and degenerative disc disease. IMPRESSION: 1. Despite efforts by the technologist and patient, severe motion artifact is present on today's exam and could not be eliminated. The patient was also unable to complete the full sequence of the exam. This reduces exam sensitivity and specificity. 2. Evolution of the appearance in the liver with the previously stippled T2 signal hyperintensities now appearing as hazy increased T2 signal, and with substantially increased involvement throughout segment 4 and 4B of the liver. Based on prior biopsy results this likely represents combination of fibrosis, lymphoplasmacytic infiltrate, hemosiderin deposition, and periportal hyperplasia resulting from portal vein thrombosis. 3. 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 No obvious extrahepatic biliary dilatation. There is some mild narrowing of the common hepatic duct along the porta hepatis,  probably due to surrounding soft tissue swelling and possibly periportal fibrosis. 4. Peripancreatic edema with stippled accentuated T2 signal in the pancreas, and substantially dilated segments of the dorsal pancreatic duct especially in the pancreatic body. 5. Lobulated T2 signal hyperintensity along the pancreatic head, possibly a pseudocyst or a thrombosed varix of the portal vein, less likely cystic pancreatic neoplasm. 6. Small right pleural effusion is new compared to previous MRI. 7. Reduced central mesenteric edema compared to previous, although there is still some peripancreatic edema as well as mesenteric edema and ascites. 8. Lower lumbar spondylosis and degenerative disc disease. Electronically Signed  By: Van Clines M.D.   On: 09/27/2020 16:11   US Paracentesis  Result Date: 10/09/2020 INDICATION: 75 year old female with history of breast cancer and abdominal pain found to have large volume ascites. EXAM: ULTRASOUND GUIDED left lower quadrant PARACENTESIS MEDICATIONS: None. COMPLICATIONS: None immediate. PROCEDURE: Informed written consent was obtained from the patient after a discussion of the risks, benefits and alternatives to treatment. A timeout was performed prior to the initiation of the procedure. Initial ultrasound scanning demonstrates a large amount of ascites within the left lower abdominal quadrant. The right lower abdomen was prepped and draped in the usual sterile fashion. 1% lidocaine was used for local anesthesia. Following this, a 6 Fr Safe-T-Centesis catheter was introduced. An ultrasound image was saved for documentation purposes. The paracentesis was performed. The catheter was removed and a dressing was applied. The patient tolerated the procedure well without immediate post procedural complication. FINDINGS: A total of approximately 3.05 L of translucent, straw-colored fluid was removed. Samples were sent to the laboratory as requested by the clinical team.  IMPRESSION: Successful ultrasound-guided paracentesis yielding 3.05 liters of peritoneal fluid. Ruthann Cancer, MD [facility] Radiology Electronically Signed   By: Ruthann Cancer MD   On: 10/09/2020 15:53   DG Chest Portable 1 View  Result Date: 10/09/2020 CLINICAL DATA:  Altered mental status.  Rule out pneumonia EXAM: PORTABLE CHEST 1 VIEW COMPARISON:  10/04/2020 FINDINGS: Progression of right lower lobe airspace disease.  No effusion Left lung remains clear.  Negative for heart failure. IMPRESSION: Progression of right lower lobe airspace disease which may represent atelectasis or pneumonia. Electronically Signed   By: Franchot Gallo M.D.   On: 10/09/2020 10:11   [place] CORE BIOPSY (SOFT TISSUE)  Result Date: 10/04/2020 INDICATION: 75 year old woman with history of breast cancer presented to interventional radiology for biopsy of new right chest wall mass. EXAM: Ultrasound-guided biopsy of right chest wall mass MEDICATIONS: None. ANESTHESIA/SEDATION: Moderate (conscious) sedation was employed during this procedure. A total of Versed 2 mg and Fentanyl 100 mcg was administered intravenously. Moderate Sedation Time: 8 minutes. The patient's level of consciousness and vital signs were monitored continuously by radiology nursing throughout the procedure under my direct supervision. COMPLICATIONS: None immediate. PROCEDURE: Informed written consent was obtained from the patient after a thorough discussion of the procedural risks, benefits and alternatives. All questions were addressed. Maximal Sterile Barrier Technique was utilized including caps, mask, sterile gowns, sterile gloves, sterile drape, hand hygiene and skin antiseptic. A timeout was performed prior to the initiation of the procedure. Patient position supine on the ultrasound table. Right anterior lower chest wall skin prepped and draped in usual sterile fashion. Following local lidocaine administration, 18  gauge biopsy needle was advanced into the right anterior chest wall mass, and four cores were obtained utilizing continuous ultrasound guidance. Samples were sent to pathology in formalin. Needle removed and hemostasis achieved with 5 minutes of manual compression. Post procedure ultrasound images showed no evidence of significant hemorrhage. IMPRESSION: Ultrasound-guided biopsy of right anterior chest wall mass. Electronically Signed   By: Miachel Roux M.D.   On: 10/04/2020 17:04    Carcinoma of unknown primary [facility]) #74 year old female patient with remote history of breast cancer; and history of portal vein thrombosis unclear etiology-on lovenox; new diagnosis of metastatic carcinoma-unknown primary is currently admitted to hospital for worsening abdominal pain/ascites.  #Worsening abdominal pain/ascites/liver lesions-s/p biopsy of right anterior chest wall nodule-positive for adenocarcinoma-on base of imaging suspicious for pancreatic malignancy.  However-based  on immunohistochemistry unclear-if GI origin or breast primary.  ER/PR HER-2/neu pending.  Stage IV malignancy. Awaiting NGS.   #Left breast mass-question recurrent malignancy.  Will discuss with surgery regarding evaluation while in the hospital.   #Progressive portal vein thrombosis-likely secondary to underlying malignancy.  Continue Lovenox 1 mg/kg SQ twice daily  #Recommendation:  #Recommend therapeutic paracentesis given patient's worsening abdominal pain.  Check for infection/given the mild leukocytosis.  Plan ceftriaxone while awaiting evaluation of ascitic fluid.  #Discussed with patient/daughter the patient's malignancy is unfortunately stage IV-and cannot be cured.  However while awaiting further work-up including outpatient PET scan-I think it is reasonable to proceed with chemotherapy if no infectious process noted.  Discussed with patient/daughter that response rates longevity hard to predict as the primary is still not very  clear-breast versus pancreatico-biliary.  Await CA 19 9.  Patient not too keen on chemotherapy if this is to be of pancreaticobiliary origin-based on her grandmother's experience with pancreatic cancer treated in 1980s.  However after lengthy discussion patient is interested in chemotherapy on palliative basis to help her symptoms at this time.  I would recommend palliative care evaluation with Josh Borders.  Thank you Dr.Agbata for allowing me to participate in the care of your pleasant patient. Please do not hesitate to contact me with questions or concerns in the interim.   All questions were answered. The patient knows to call the clinic with any problems, questions or concerns.    Cammie Sickle, MD 10/09/2020 10:14 PM

## 2020-10-09 NOTE — ED Notes (Signed)
Patient assisted x1 to bsc. Patient able to have medium bowel movement.

## 2020-10-09 NOTE — ED Notes (Signed)
Pt assisted x1 assist to bathroom. Pt able to have small BM. New brief given.

## 2020-10-09 NOTE — ED Notes (Signed)
Oncology at bedside

## 2020-10-09 NOTE — ED Triage Notes (Addendum)
Pt arrives via ems from home. Ems reports pt called out for constipation x 4-5 days. Seen previously for emesis on 3/13. Pt reports last BM on 3/11. Ems reports pt had recent liver biopsy, unsure of date. Pt reports pain in RUQ starting 2 weeks ago.  Pt a&o x 4 on arrival. Denies n/v. Upper abd feels distended and tight on palpation. NAD noted at this time Ems vital wnl

## 2020-10-10 ENCOUNTER — Other Ambulatory Visit: Payer: Self-pay | Admitting: Internal Medicine

## 2020-10-10 ENCOUNTER — Inpatient Hospital Stay: Payer: Medicare PPO | Attending: Internal Medicine

## 2020-10-10 VITALS — BP 109/69 | HR 78 | Temp 98.0°F | Resp 18

## 2020-10-10 DIAGNOSIS — R1011 Right upper quadrant pain: Secondary | ICD-10-CM | POA: Diagnosis not present

## 2020-10-10 DIAGNOSIS — C801 Malignant (primary) neoplasm, unspecified: Secondary | ICD-10-CM | POA: Insufficient documentation

## 2020-10-10 DIAGNOSIS — Z515 Encounter for palliative care: Secondary | ICD-10-CM

## 2020-10-10 DIAGNOSIS — I959 Hypotension, unspecified: Secondary | ICD-10-CM | POA: Insufficient documentation

## 2020-10-10 DIAGNOSIS — I81 Portal vein thrombosis: Secondary | ICD-10-CM

## 2020-10-10 DIAGNOSIS — Z5111 Encounter for antineoplastic chemotherapy: Secondary | ICD-10-CM | POA: Insufficient documentation

## 2020-10-10 LAB — BASIC METABOLIC PANEL
Anion gap: 9 (ref 5–15)
BUN: 25 mg/dL — ABNORMAL HIGH (ref 8–23)
CO2: 20 mmol/L — ABNORMAL LOW (ref 22–32)
Calcium: 9.1 mg/dL (ref 8.9–10.3)
Chloride: 110 mmol/L (ref 98–111)
Creatinine, Ser: 0.62 mg/dL (ref 0.44–1.00)
GFR, Estimated: >60 mL/min (ref >60)
Glucose, Bld: 121 mg/dL — ABNORMAL HIGH (ref 70–99)
Potassium: 4 mmol/L (ref 3.5–5.1)
Sodium: 139 mmol/L (ref 135–145)

## 2020-10-10 LAB — PHOSPHORUS: Phosphorus: 3 mg/dL (ref 2.5–4.6)

## 2020-10-10 LAB — CBC
HCT: 28.9 % — ABNORMAL LOW (ref 36.0–46.0)
Hemoglobin: 9.6 g/dL — ABNORMAL LOW (ref 12.0–15.0)
MCH: 27.1 pg (ref 26.0–34.0)
MCHC: 33.2 g/dL (ref 30.0–36.0)
MCV: 81.6 fL (ref 80.0–100.0)
Platelets: 198 10*3/uL (ref 150–400)
RBC: 3.54 MIL/uL — ABNORMAL LOW (ref 3.87–5.11)
RDW: 17.1 % — ABNORMAL HIGH (ref 11.5–15.5)
WBC: 13.3 10*3/uL — ABNORMAL HIGH (ref 4.0–10.5)
nRBC: 0 % (ref 0.0–0.2)

## 2020-10-10 LAB — MAGNESIUM: Magnesium: 2.2 mg/dL (ref 1.7–2.4)

## 2020-10-10 LAB — PROTEIN, BODY FLUID (OTHER): Total Protein, Body Fluid Other: 3.2 g/dL

## 2020-10-10 MED ORDER — SODIUM CHLORIDE 0.9 % IV SOLN
416.5000 mg | Freq: Once | INTRAVENOUS | Status: AC
  Administered 2020-10-10: 420 mg via INTRAVENOUS
  Filled 2020-10-10: qty 42

## 2020-10-10 MED ORDER — FAMOTIDINE 20 MG IN NS 100 ML IVPB
20.0000 mg | Freq: Once | INTRAVENOUS | Status: AC
  Administered 2020-10-10: 20 mg via INTRAVENOUS
  Filled 2020-10-10: qty 20

## 2020-10-10 MED ORDER — PALONOSETRON HCL INJECTION 0.25 MG/5ML
0.2500 mg | Freq: Once | INTRAVENOUS | Status: AC
  Administered 2020-10-10: 0.25 mg via INTRAVENOUS
  Filled 2020-10-10: qty 5

## 2020-10-10 MED ORDER — FAMOTIDINE IN NACL 20-0.9 MG/50ML-% IV SOLN: 20.0000 mg | Freq: Once | INTRAVENOUS | Status: DC

## 2020-10-10 MED ORDER — DIPHENHYDRAMINE HCL 50 MG/ML IJ SOLN
50.0000 mg | Freq: Once | INTRAMUSCULAR | Status: AC
  Administered 2020-10-10: 50 mg via INTRAVENOUS
  Filled 2020-10-10: qty 1

## 2020-10-10 MED ORDER — ENOXAPARIN SODIUM 40 MG/0.4ML ~~LOC~~ SOLN: 40.0000 mg | SUBCUTANEOUS | Status: DC

## 2020-10-10 MED ORDER — SODIUM CHLORIDE 0.9 % IV SOLN
Freq: Once | INTRAVENOUS | Status: AC
  Filled 2020-10-10: qty 250

## 2020-10-10 MED ORDER — SODIUM CHLORIDE 0.9 % IV SOLN
175.0000 mg/m2 | Freq: Once | INTRAVENOUS | Status: AC
  Administered 2020-10-10: 324 mg via INTRAVENOUS
  Filled 2020-10-10: qty 54

## 2020-10-10 MED ORDER — ENOXAPARIN SODIUM 80 MG/0.8ML ~~LOC~~ SOLN
1.0000 mg/kg | Freq: Two times a day (BID) | SUBCUTANEOUS | Status: DC
  Administered 2020-10-10 – 2020-10-12 (×4): 75 mg via SUBCUTANEOUS
  Filled 2020-10-10 (×6): qty 0.8

## 2020-10-10 MED ORDER — SODIUM CHLORIDE 0.9 % IV SOLN
150.0000 mg | Freq: Once | INTRAVENOUS | Status: AC
  Administered 2020-10-10: 150 mg via INTRAVENOUS
  Filled 2020-10-10: qty 150

## 2020-10-10 MED ORDER — SODIUM CHLORIDE 0.9 % IV SOLN
10.0000 mg | Freq: Once | INTRAVENOUS | Status: AC
  Administered 2020-10-10: 10 mg via INTRAVENOUS
  Filled 2020-10-10: qty 10

## 2020-10-10 NOTE — Progress Notes (Addendum)
[facility] Progress Note  Patient: Theresa Andrews    CLE:[id number]  DOA: 10/09/2020     Date of Service: the patient was seen and examined on 10/10/2020  Chief Complaint  Patient presents with  . Constipation   Brief hospital course: Theresa Andrews is a 75 y.o. female with medical history significant for left-sided breast cancer status post lumpectomy, radiation and chemotherapy, history of hyperparathyroidism, hypertension, anxiety disorder, hypocalcemia and dyslipidemia who presents to the ER for evaluation of persistent abdominal pain as well as mental status changes.  ED w/up Labs show sodium 139, potassium 4.2, chloride 107, bicarb 24, glucose 119, BUN 28, creatinine 0.70, calcium 9.6, alkaline phosphatase 97, albumin 3.0, lipase 24, AST 25, ALT 13, total protein 6.8, ammonia level, white count 13.7, hemoglobin 10.0, hematocrit 32.4, MCV 84.4, RDW 17.4, platelet count 265. CT scan of abdomen and pelvis shows extensive metastatic disease throughout the abdomen as described above. Findings stable since prior study. Chronic portal vein thrombosis with cavernous transformation, Stable. Upper abdominal and retroperitoneal adenopathy, stable. Large volume ascites, likely malignant ascites, stable. No evidence of bowel obstruction. Stable moderate right pleural effusion with right lower lobe atelectasis. Chest x-ray reviewed by me shows progression of right lower lobe airspace disease which may represent atelectasis or pneumonia. CT scan of the head without contrast shows no acute intracranial abnormality. No abnormal enhancement. Twelve-lead EKG reviewed by me shows sinus rhythm with nonspecific T wave abnormality.    Assessment and Plan: Principal Problem:   Abdominal pain Active Problems:   Anxiety disorder   Benign essential HTN   Malignant ascites    Abdominal pain most likely secondary to extensive metastatic disease noted throughout the abdomen.  Peritoneal fluid  negative for SBP Patient had a recent biopsy done of the right side chest wall mass consistent with adenocarcinoma of the pancreas She also has large volume ascites most likely metastatic S/p paracentesis 3 L fluid was tapped, cell count last, ruled out SBP,  fluid culture is still pending Continue Rocephin 2 g IV daily Pain control Discontinue ceftriaxone tomorrow if fluid culture negative and WBC count within normal range   Newly diagnosed metastatic adenocarcinoma of the pancreas Consult oncology   Hypertension Continue ibesartan, metoprolol and amlodipine   Depression/anxiety Continue Paxil   History of portal vein thrombosis Continue Lovenox q12hr PTA dose  Body mass index is 28.32 kg/m.   Poor prognosis, palliative care consulted for goals of care discussion.  CODE STATUS was changed to DNR.     Diet: Heart healthy DVT Prophylaxis: Subcutaneous Lovenox   Advance goals of care discussion: DNR  Family Communication: family was present at bedside, at the time of interview.  The pt provided permission to discuss medical plan with the family. Opportunity was given to ask question and all questions were answered satisfactorily.   Disposition:  Pt is from home, admitted with abdominal pain and AMS, still has abdominal pain and patient is not back to baseline, which precludes a safe discharge. Discharge to home, when pain and mental status will improve, cleared by oncologist.  Subjective: No significant overnight issues, patient's mental status is improving but still seems to be little confused and not back to her baseline.  Patient abdominal pain is better now after paracentesis, denied any other active issues.  Physical Exam: General:  alert oriented to place and person.  Appear in mild distress, affect appropriate Eyes: PERRLA ENT: Oral Mucosa Clear, moist  Neck: no JVD,  Cardiovascular: S1 and S2  Present, no Murmur,  Respiratory: good respiratory effort,  Bilateral Air entry equal and Decreased, no Crackles, no wheezes Abdomen: Bowel Sound present, Soft and no tenderness,  Skin: no rashes Extremities: no Pedal edema, no calf tenderness Neurologic: without any new focal findings Gait not checked due to patient safety concerns  Vitals:   10/09/20 2005 10/10/20 0458 10/10/20 0851 10/10/20 1148  BP: 111/70 106/64 115/67 112/71  Pulse: 88 95 82 89  Resp: 18 18 18 16   Temp: 98.5 F (36.9 C) 97.6 F (36.4 C) 97.7 F (36.5 C) 97.9 F (36.6 C)  TempSrc: Oral Oral Oral Oral  SpO2: 98% 98% 100% 99%  Weight:      Height:        Intake/Output Summary (Last 24 hours) at 10/10/2020 1405 Last data filed at 10/10/2020 1022 Gross per 24 hour  Intake 460.08 ml  Output -  Net 460.08 ml   Filed Weights   10/09/20 0754  Weight: 74.8 kg    Data Reviewed: I have personally reviewed and interpreted daily labs, tele strips, imagings as discussed above. I reviewed all nursing notes, pharmacy notes, vitals, pertinent old records I have discussed plan of care as described above with RN and patient/family.  CBC: Recent Labs  Lab 10/04/20 0449 10/06/20 1419 10/09/20 0821 10/10/20 0523  WBC 11.7* 16.6* 13.7* 13.3*  NEUTROABS  --   --  11.0*  --   HGB 9.6* 10.3* 10.0* 9.6*  HCT 29.3* 32.9* 32.4* 28.9*  MCV 81.8 84.4 84.4 81.6  PLT 200 235 265 762   Basic Metabolic Panel: Recent Labs  Lab 10/04/20 0449 10/06/20 1419 10/09/20 0821 10/10/20 0523  NA 138 139 139 139  K 4.2 4.2 4.3 4.0  CL 109 108 107 110  CO2 20* 22 24 20*  GLUCOSE 99 132* 119* 121*  BUN 19 24* 28* 25*  CREATININE 0.64 0.72 0.70 0.62  CALCIUM 9.4 9.8 9.6 9.1  MG  --   --   --  2.2  PHOS  --   --   --  3.0    Studies: US Paracentesis  Result Date: 10/09/2020 INDICATION: 75 year old female with history of breast cancer and abdominal pain found to have large volume ascites. EXAM: ULTRASOUND GUIDED left lower quadrant PARACENTESIS MEDICATIONS: None. COMPLICATIONS:  None immediate. PROCEDURE: Informed written consent was obtained from the patient after a discussion of the risks, benefits and alternatives to treatment. A timeout was performed prior to the initiation of the procedure. Initial ultrasound scanning demonstrates a large amount of ascites within the left lower abdominal quadrant. The right lower abdomen was prepped and draped in the usual sterile fashion. 1% lidocaine was used for local anesthesia. Following this, a 6 Fr Safe-T-Centesis catheter was introduced. An ultrasound image was saved for documentation purposes. The paracentesis was performed. The catheter was removed and a dressing was applied. The patient tolerated the procedure well without immediate post procedural complication. FINDINGS: A total of approximately 3.05 L of translucent, straw-colored fluid was removed. Samples were sent to the laboratory as requested by the clinical team. IMPRESSION: Successful ultrasound-guided paracentesis yielding 3.05 liters of peritoneal fluid. Ruthann Cancer, MD [facility] Radiology Electronically Signed   By: Ruthann Cancer MD   On: 10/09/2020 15:53    Scheduled Meds: . amLODipine  10 mg Oral Daily  . cholecalciferol  1,000 Units Oral Daily  . irbesartan  300 mg Oral Daily  . metoprolol succinate  12.5 mg Oral Daily  .  PARoxetine  10 mg Oral Daily  . polyethylene glycol  17 g Oral Daily  . rosuvastatin  10 mg Oral Daily   Continuous Infusions: . cefTRIAXone (ROCEPHIN)  IV Stopped (10/09/20 1255)   PRN Meds: lactulose, morphine injection, ondansetron **OR** ondansetron (ZOFRAN) IV  Time spent: 35 minutes  Author: Val Riles. MD [facility] 10/10/2020 2:05 PM  To reach On-call, see care teams to locate the attending and reach out to them via [email or web address]. If 7PM-7AM, please contact night-coverage If you still have difficulty reaching the attending provider, please page the [facility] (Director on  Call) for [facility] on amion for assistance.

## 2020-10-10 NOTE — Plan of Care (Signed)
Alert and oriented to person and place. Denies pain or discomfort this shift. Needs 1 person assist with adls. Bed alarm in use. Calm and pleasant and compliant with care. Continue plan of care.  Problem: Health Behavior/Discharge Planning: Goal: Ability to manage health-related needs will improve Outcome: Progressing   Problem: Clinical Measurements: Goal: Ability to maintain clinical measurements within normal limits will improve Outcome: Progressing Goal: Will remain free from infection Outcome: Progressing Goal: Cardiovascular complication will be avoided Outcome: Progressing   Problem: Activity: Goal: Risk for activity intolerance will decrease Outcome: Progressing   Problem: Nutrition: Goal: Adequate nutrition will be maintained Outcome: Progressing   Problem: Coping: Goal: Level of anxiety will decrease Outcome: Progressing   Problem: Pain Managment: Goal: General experience of comfort will improve Outcome: Progressing   Problem: Safety: Goal: Ability to remain free from injury will improve Outcome: Progressing

## 2020-10-10 NOTE — Progress Notes (Signed)
   10/10/20 1300  Clinical Encounter Type  Visited With Patient and family together  Visit Type Initial;Spiritual support;Social support  Referral From Nurse  Consult/Referral To [place] responded to AD request from nurse, per request PT. Chaplain did AD education with the PT. PT will fill it out at another time and then request chaplain to get it notarized. Chaplain was also briefly able to speak to her family member, and they both expressed their feelings.

## 2020-10-10 NOTE — Consult Note (Signed)
[place]  Telephone:([phone] [phone]   Name: Theresa Andrews Date: 10/10/2020 MRN: [id number]  DOB: 1945-12-26  Patient Care Team: Steele Sizer, MD as PCP - General (Family Medicine) Cammie Sickle, MD as Consulting Physician (Oncology) Corey Skains, MD as Consulting Physician (Cardiology) Gabriel Carina Betsey Holiday, MD as Consulting Physician (Endocrinology) Baxter Kail, MD as Consulting Physician (Dermatology) Anell Barr, OD as Consulting Physician (Optometry) Pieter Partridge, MD as Referring Physician (Dermatology) Vern Claude, LCSW as Social Worker Neldon Labella, RN as Registered Nurse Michaelle Birks, Glean Salvo, [facility] (Pharmacist)    REASON FOR CONSULTATION: Theresa Andrews is a 75 y.o. female with multiple medical problems including history of left-sided breast cancer status post lumpectomy/XRT/chemotherapy, history of parathyroidism, hypertension, anxiety, and hyperlipidemia.  Patient was admitted to the hospital on 3/72/22 with persistent abdominal pain and altered mental status.  She was recently hospitalized 10/03/2020-10/04/2020 with similar complaints.  CT of the abdomen and pelvis revealed new progressive metastatic disease involving the pancreas.  She underwent biopsy with pathology showing poorly differentiated carcinoma of unknown primary.  Palliative care was consulted help address goals.  SOCIAL HISTORY:     reports that she quit smoking about 14 years ago. Her smoking use included cigarettes. She has a 6.25 pack-year smoking history. She has never used smokeless tobacco. She reports that she does not drink alcohol and does not use drugs.  Patient is divorced.  She lives at home alone.  She has a daughter who lives next door.  She has 2 other daughters who live nearby.  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  ADVANCE DIRECTIVES:  None on file  CODE  STATUS: DNR  PAST MEDICAL HISTORY: Past Medical History:  Diagnosis Date  . Anxiety   . Breast cancer ([place])    pT2 pN0 cM0 (stage IIA) invasive mammary carcinoma of left outer breast status post lumpectomy and sentinel node study on July 14, 2010  . CAD (coronary artery disease)   . Depression   . H/O hypokalemia   . History of chemotherapy   . History of MI (myocardial infarction)   . Hyperglycemia   . Hyperlipidemia   . Hypertension   . Peripheral neuropathy due to chemotherapy ([place])   . Personal history of chemotherapy 2011   left breast ca  . Personal history of radiation therapy 2001   left breast ca    PAST SURGICAL HISTORY:  Past Surgical History:  Procedure Laterality Date  . ABDOMINAL HYSTERECTOMY    . BREAST BIOPSY Left 2008   neg  . BREAST BIOPSY Left 03/05/2010   invasive mammary carcinoma  . BREAST LUMPECTOMY Left 07/14/2010   INVASIVE MAMMARY CARCINOMA WITH PROMINENT INFILTRATIVE PATTERN, negative margins  . CHOLECYSTECTOMY    . COLONOSCOPY WITH PROPOFOL N/A 11/22/2015   Procedure: COLONOSCOPY WITH PROPOFOL;  Surgeon: Hulen Luster, MD;  Location: [facility] ENDOSCOPY;  Service: Gastroenterology;  Laterality: N/A;  . CORONARY ANGIOPLASTY WITH STENT PLACEMENT  2008    HEMATOLOGY/ONCOLOGY HISTORY:  Oncology History Overview Note  # AUG 2011- LEFT BREAST CA Stage II ER/PR- Pos; her 2 NEG; s/p neo-adj ACx4- poor response; s/p lumpec [Dr.Arru]- ypT2 [2.5cm]ypsN-0/2; Taxol weekly 9/12 [sec to PN]; finished march 2012. Arimidex-May 2012; Aug 2013- changed to Aug 2013; sep 2013- Started Tam; Mammo- in April 2017.   # Declines extended anti-hormone therapy; # Feb 2017- Bone scan- Negative [hypercalcemia] ;   # AUG  2021- liver Biopsy- LIVER; BIOPSY:[[facility] II OPINION] - LIVER PARENCHYMA WITH ECTATIC PORTAL VEINS WITH HERNIATION AND FOCI SUGGESTIVE OF ORGANIZING THROMBI, PERIDUCTAL FIBROSIS WITH CHRONIC  LYMPHOPLASMACYTIC INFILTRATE, HEMOSIDERIN DEPOSITION AND PERIPORTAL   REGENERATIVE HYPERPLASIA, SUGGESTIVE OF CHANGES SECONDARY TO PORTAL VEIN  THROMBOSIS (CLINICAL). - FEATURES OF OBSTRUCTIVE BILIARY CHANGES. - NO EVIDENCE OF MALIGNANCY IS SEEN.   # Hypercalcemia- chronic [2016]; primary hyperparathyrdoisim; improved after stopping HCTZ.    # PN- G-1  DIAGNOSIS: breast cancer  STAGE:  II       ;GOALS: cure  CURRENT/MOST RECENT THERAPY: surviellance    Breast CA ([place])  10/29/2014 Initial Diagnosis   Breast CA ([place])   Carcinoma of overlapping sites of left breast in female, estrogen receptor positive ([place])    ALLERGIES:  is allergic to tetanus toxoids and shellfish allergy.  MEDICATIONS:  Current Facility-Administered Medications  Medication Dose Route Frequency Provider Last Rate Last Admin  . amLODipine (NORVASC) tablet 10 mg  10 mg Oral Daily Agbata, Tochukwu, MD   10 mg at 10/10/20 0931  . cefTRIAXone (ROCEPHIN) 2 g in sodium chloride 0.9 % 100 mL IVPB  2 g Intravenous Q24H Agbata, Tochukwu, MD   Stopped at 10/09/20 1255  . cholecalciferol (VITAMIN D3) tablet 1,000 Units  1,000 Units Oral Daily Agbata, Tochukwu, MD   1,000 Units at 10/10/20 0931  . irbesartan (AVAPRO) tablet 300 mg  300 mg Oral Daily Agbata, Tochukwu, MD   300 mg at 10/10/20 0931  . lactulose (CHRONULAC) 10 GM/15ML solution 20 g  20 g Oral Daily PRN Agbata, Tochukwu, MD      . metoprolol succinate (TOPROL-XL) 24 hr tablet 12.5 mg  12.5 mg Oral Daily Agbata, Tochukwu, MD   12.5 mg at 10/10/20 0931  . morphine 2 MG/ML injection 2 mg  2 mg Intravenous Q4H PRN Agbata, Tochukwu, MD   2 mg at 10/09/20 1844  . ondansetron (ZOFRAN) tablet 4 mg  4 mg Oral Q6H PRN Agbata, Tochukwu, MD       Or  . ondansetron (ZOFRAN) injection 4 mg  4 mg Intravenous Q6H PRN Agbata, Tochukwu, MD      . PARoxetine (PAXIL) tablet 10 mg  10 mg Oral Daily Agbata, Tochukwu, MD   10 mg at 10/10/20 0931  . polyethylene glycol (MIRALAX / GLYCOLAX) packet 17 g  17 g Oral Daily Agbata, Tochukwu, MD   17 g at  10/10/20 0931  . rosuvastatin (CRESTOR) tablet 10 mg  10 mg Oral Daily Agbata, Tochukwu, MD   10 mg at 10/10/20 0931    VITAL SIGNS: BP 115/67 (BP Location: Right Arm)   Pulse 82   Temp 97.7 F (36.5 C) (Oral)   Resp 18   Ht $R'5\' 4"'EJ$  (1.626 m)   Wt 165 lb (74.8 kg)   SpO2 100%   BMI 28.32 kg/m  Filed Weights   10/09/20 0754  Weight: 165 lb (74.8 kg)    Estimated body mass index is 28.32 kg/m as calculated from the following:   Height as of this encounter: $RemoveBeforeD'5\' 4"'LWwklntqxOBIYZ$  (1.626 m).   Weight as of this encounter: 165 lb (74.8 kg).  LABS: CBC:    Component Value Date/Time   WBC 13.3 (H) 10/10/2020 0523   HGB 9.6 (L) 10/10/2020 0523   HGB 13.2 03/02/2014 1034   HCT 28.9 (L) 10/10/2020 0523   HCT 39.7 03/02/2014 1034   PLT 198 10/10/2020 0523   PLT 269 03/02/2014 1034   MCV 81.6 10/10/2020 0523   MCV  94 03/02/2014 1034   NEUTROABS 11.0 (H) 10/09/2020 0821   NEUTROABS 4.3 03/02/2014 1034   LYMPHSABS 1.0 10/09/2020 0821   LYMPHSABS 2.4 03/02/2014 1034   MONOABS 1.0 10/09/2020 0821   MONOABS 0.5 03/02/2014 1034   EOSABS 0.4 10/09/2020 0821   EOSABS 0.2 03/02/2014 1034   BASOSABS 0.0 10/09/2020 0821   BASOSABS 0.1 03/02/2014 1034   Comprehensive Metabolic Panel:    Component Value Date/Time   NA 139 10/10/2020 0523   NA 142 05/29/2020 1344   K 4.0 10/10/2020 0523   CL 110 10/10/2020 0523   CO2 20 (L) 10/10/2020 0523   BUN 25 (H) 10/10/2020 0523   BUN 9 05/29/2020 1344   CREATININE 0.62 10/10/2020 0523   CREATININE 0.63 08/08/2020 0000   GLUCOSE 121 (H) 10/10/2020 0523   GLUCOSE 134 (H) 03/01/2013 1414   CALCIUM 9.1 10/10/2020 0523   AST 25 10/09/2020 0821   AST 13 (L) 03/02/2014 1034   ALT 13 10/09/2020 0821   ALT 21 03/02/2014 1034   ALKPHOS 97 10/09/2020 0821   ALKPHOS 76 03/02/2014 1034   BILITOT 0.6 10/09/2020 0821   BILITOT 0.4 05/29/2020 1344   BILITOT 0.2 03/02/2014 1034   PROT 6.8 10/09/2020 0821   PROT 6.6 05/29/2020 1344   PROT 7.5 03/02/2014 1034    ALBUMIN 3.0 (L) 10/09/2020 0821   ALBUMIN 3.9 05/29/2020 1344   ALBUMIN 3.5 03/02/2014 1034    RADIOGRAPHIC STUDIES: DG Chest 2 View  Result Date: 10/04/2020 CLINICAL DATA:  Status post right chest wall mass biopsy EXAM: CHEST - 2 VIEW COMPARISON:  Chest radiograph March 26, 2010 FINDINGS: The heart size and mediastinal contours are within normal limits. Calcifications aortic arch. No visible pneumothorax. Right lower lobe airspace opacity. Small right pleural effusion. No visible pneumothorax. Thoracic spondylosis. IMPRESSION: Right lower lobe atelectasis with right pleural effusion. No visible pneumothorax. Electronically Signed   By: Dahlia Bailiff MD   On: 10/04/2020 12:36   CT Head Wo Contrast  Result Date: 10/03/2020 CLINICAL DATA:  Headache, intracranial hemorrhage suspected EXAM: CT HEAD WITHOUT CONTRAST TECHNIQUE: Contiguous axial images were obtained from the base of the skull through the vertex without intravenous contrast. COMPARISON:  10/01/2020 FINDINGS: Brain: No evidence of acute infarction, hemorrhage, hydrocephalus, extra-axial collection or mass lesion/mass effect. Mild periventricular and deep white matter hypodensity. Vascular: No hyperdense vessel or unexpected calcification. Skull: Normal. Negative for fracture or focal lesion. Sinuses/Orbits: No acute finding. Other: None. IMPRESSION: 1. No acute intracranial pathology. No non-contrast CT findings to explain headache. Specifically, no evidence of intracranial hemorrhage. 2.  Small-vessel white matter disease. Electronically Signed   By: Eddie Candle M.D.   On: 10/03/2020 12:06   CT Head Wo Contrast  Result Date: 10/01/2020 CLINICAL DATA:  Altered mental status, unsteady gait EXAM: CT HEAD WITHOUT CONTRAST TECHNIQUE: Contiguous axial images were obtained from the base of the skull through the vertex without intravenous contrast. COMPARISON:  None. FINDINGS: Brain: No evidence of acute infarction, hemorrhage, hydrocephalus,  extra-axial collection or mass lesion/mass effect. Scattered low-density changes within the periventricular and subcortical white matter compatible with chronic microvascular ischemic change. Mild diffuse cerebral volume loss. Vascular: Atherosclerotic calcifications involving the large vessels of the skull base. No unexpected hyperdense vessel. Skull: Normal. Negative for fracture or focal lesion. Sinuses/Orbits: No acute finding. Other: None. IMPRESSION: 1. No acute intracranial findings. 2. Mild chronic microvascular ischemic change and cerebral volume loss. Electronically Signed   By: Davina Poke D.O.   On: 10/01/2020  16:28   CT Head W or Wo Contrast  Result Date: 10/09/2020 CLINICAL DATA:  Mental status change EXAM: CT HEAD WITHOUT AND WITH CONTRAST TECHNIQUE: Contiguous axial images were obtained from the base of the skull through the vertex without and with intravenous contrast CONTRAST:  OMNIPAQUE IOHEXOL 300 MG/ML  SOLN COMPARISON:  10/03/2020 FINDINGS: Brain: There is no acute intracranial hemorrhage, mass, mass effect, or edema. No abnormal enhancement. Gray-white differentiation is preserved. There is no extra-axial fluid collection. Ventricles and sulci are stable in size and configuration. Patchy hypoattenuation in the supratentorial white matter is nonspecific but likely reflects stable mild chronic microvascular ischemic changes. Vascular: There is atherosclerotic calcification at the skull base. Skull: Calvarium is unremarkable. Sinuses/Orbits: No acute finding. Other: None. IMPRESSION: No acute intracranial abnormality.  No abnormal enhancement. Electronically Signed   By: Guadlupe Spanish M.D.   On: 10/09/2020 09:55   CT ABDOMEN PELVIS W CONTRAST  Result Date: 10/09/2020 CLINICAL DATA:  Confusion, abdominal bloating, pain EXAM: CT ABDOMEN AND PELVIS WITH CONTRAST TECHNIQUE: Multidetector CT imaging of the abdomen and pelvis was performed using the standard protocol following bolus  administration of intravenous contrast. CONTRAST:  OMNIPAQUE IOHEXOL 300 MG/ML  SOLN COMPARISON:  10/06/2020 FINDINGS: Lower chest: Moderate right pleural effusion. Compressive atelectasis in the right lower lobe. Stable mass in a right lower costochondral junction is unchanged. Postsurgical changes in the left breast. This is unchanged. Hepatobiliary: No change in the multiple masses throughout the liver presumably metastases. Dilated and thrombosed portal vein again noted with cavernous transformation, stable. Pancreas: Stable dilated pancreatic duct. Stable pancreatic body and pancreatic tail masses. Spleen: No focal abnormality.  Normal size. Adrenals/Urinary Tract: Stable low-density lesion off the upper pole of the left kidney measuring 1.8 cm. No hydronephrosis. Adrenal glands and urinary bladder unremarkable. Stomach/Bowel: Moderate stool throughout the colon. No evidence of bowel obstruction. Vascular/Lymphatic: Aortic atherosclerosis. Upper abdominal adenopathy again noted, the largest a portacaval node measuring up to 3.9 cm. Retroperitoneal adenopathy, stable. Reproductive: Prior hysterectomy Other: Large volume ascites in the abdomen and pelvis. Nodularity throughout the mesentery and omentum, most pronounced in the right abdomen and upper abdomen most likely reflects malignant ascites and peritoneal spread of disease. This may also be reflected within umbilical nodule, stable. Musculoskeletal: No acute bony abnormality or focal lesion. IMPRESSION: Extensive metastatic disease throughout the abdomen as described above. Findings stable since prior study. Chronic portal vein thrombosis with cavernous transformation, stable. Upper abdominal and retroperitoneal adenopathy, stable. Large volume ascites, likely malignant ascites, stable. No evidence of bowel obstruction. Stable moderate right pleural effusion with right lower lobe atelectasis. Electronically Signed   By: Charlett Nose M.D.   On:  10/09/2020 10:15   CT ABDOMEN PELVIS W CONTRAST  Result Date: 10/06/2020 CLINICAL DATA:  Abdominal swelling, vomiting, history of left breast cancer, metastatic disease on previous CT EXAM: CT ABDOMEN AND PELVIS WITH CONTRAST TECHNIQUE: Multidetector CT imaging of the abdomen and pelvis was performed using the standard protocol following bolus administration of intravenous contrast. CONTRAST:  OMNIPAQUE IOHEXOL 300 MG/ML  SOLN COMPARISON:  10/03/2020 FINDINGS: Lower chest: Stable right pleural effusion and right lower lobe atelectasis. The soft tissue mass at the right anterior sixth costochondral junction is unchanged, please correlate with recent biopsy results. Chronic postsurgical changes left breast. Hepatobiliary: No change in the multiple confluent hypodense masses within the liver consistent with presumed metastases. Dilated thrombosed portal vein again noted. Pancreas: There is marked pancreatic parenchymal atrophy. There are multiple pancreatic masses  identified. In the pancreatic tail hypodense mass measures approximately 2.4 x 1.8 cm image 24/2. Hypodense mass in the dorsal aspect of the pancreatic body image 28/2 measures 2.2 x 1.9 cm, with hyperdense margins. Pancreatic duct dilation unchanged. Spleen: Spleen demonstrates normal size without focal abnormality. Splenic vein is patent. Adrenals/Urinary Tract: Indeterminate 1.8 cm hypodensity upper pole left kidney with Hounsfield attenuation of 43, stable. No other focal renal abnormalities. No renal tract calculi or obstructive uropathy. The bladder is decompressed with no focal abnormalities. The adrenals are normal. Stomach/Bowel: There is moderate gas and stool throughout the colon which may reflect an element of constipation. No evidence of bowel obstruction or ileus. Vascular/Lymphatic: Extensive atherosclerosis of the aorta is again noted unchanged. Chronic portal vein thrombosis with cavernous transformation again seen. The splenic vein  and superior mesenteric vein are patent. Pathologic adenopathy is seen throughout the upper abdomen. 10 mm lymph node in the porta hepatis image 25/2 is stable. Multiple enlarged lymph nodes are seen within the gastrohepatic ligament, largest measuring 11 mm in short axis image 23/2. Necrotic lymph node dorsal to the pancreatic body image 28/2 measures 19 mm in short axis. Aortocaval lymph node image 36/2 measures 10 mm in short axis. Reproductive: Status post hysterectomy. No adnexal masses. Other: Moderate abdominal and pelvic ascites, without appreciable change since prior study. There is some soft tissue nodularity within the right upper quadrant mesentery, just inferior to the liver and along the hepatic flexure of the colon, concerning for peritoneal implant. No free intraperitoneal gas. Stable umbilical hernia containing mesenteric fat. Inflammatory changes within the hernia may suggest incarceration. Musculoskeletal: No acute or destructive bony lesions. Reconstructed images demonstrate no additional findings. IMPRESSION: 1. Extensive intra-abdominal and intrapelvic metastases, without significant change since prior study. Given the above findings, this may reflect primary pancreatic adenocarcinoma with diffuse metastases. Correlation with recent biopsy results recommended. No significant change in the appearance of the metastatic disease since prior study. 2. Moderate ascites, without significant change since prior study. This is likely malignant ascites given the soft tissue nodularity within the right upper quadrant mesentery. 3. Chronic portal vein thrombosis with cavernous transformation. 4. No evidence of bowel obstruction or ileus. Retained gas and stool throughout the colon may reflect constipation. 5. Stable right pleural effusion. 6.  Aortic Atherosclerosis (ICD10-I70.0). Electronically Signed   By: Randa Ngo M.D.   On: 10/06/2020 19:51   CT ABDOMEN PELVIS W CONTRAST  Addendum Date:  10/03/2020   ADDENDUM REPORT: 10/03/2020 14:10 ADDENDUM: ,There is soft tissue fullness along the anterior right lower rib cartilage, including on 14/2. This is new since 02/19/2020, also highly suspicious for metastatic disease. Electronically Signed   By: Abigail Miyamoto M.D.   On: 10/03/2020 14:10   Result Date: 10/03/2020 CLINICAL DATA:  Right-sided abdominal pain for about a week with nausea and vomiting x2. Constipation. Remote breast cancer history. EXAM: CT ABDOMEN AND PELVIS WITH CONTRAST TECHNIQUE: Multidetector CT imaging of the abdomen and pelvis was performed using the standard protocol following bolus administration of intravenous contrast. CONTRAST:  143mL OMNIPAQUE IOHEXOL 300 MG/ML  SOLN COMPARISON:  Multiple priors, including MRCP of 09/26/2020, abdominal CT of 02/19/2020, abdominal MRI 03/07/2020. FINDINGS: Lower chest: Right base atelectasis. Small right pleural effusion is new since the prior CT. Normal heart size with multivessel coronary artery atherosclerosis. Left-sided calcification or surgical clips. Deep to the presumed lumpectomy site is a centrally hypoattenuating lesion of 2.8 x 2.2 cm on 05/02. This is likely not imaged on the prior  CT, but present on the 03/07/2020 MRI where it was grossly similar. Hepatobiliary: Multiple hepatic masses are again identified. A high right hepatic lobe lesion measures 2.6 cm on 13/2 versus 1.8 cm on 02/19/2020. A wedge-shaped mass within segments 4 and 2 of the liver measures on the order of 10.5 x 5.1 cm on 19/2. Significantly enlarged from 02/19/2020, where lesions in this region measured up to 3.0 cm. Increased from on the order of 3.3 cm on 03/07/2020. Cholecystectomy without biliary duct dilatation. Pancreas: The pancreas demonstrates similar atrophy with moderate duct dilatation at 9 mm on 29/2. Duct dilatation is followed to the level of the pancreatic head, with there is mild soft tissue fullness but no well-defined mass. Example 34/2. This is  unchanged. The pancreatic tail demonstrates developing relative soft tissue fullness compared to the pancreatic body. Example at on the order of 2.3 cm in thickness on 25/2 versus 1.4 cm on 02/19/2020. Spleen: Normal in size, without focal abnormality. Adrenals/Urinary Tract: Normal adrenal glands. Upper pole left renal lesion of 1.6 cm is greater than fluid density but was determined a complex cyst on prior MRI. Normal right kidney. Contrast in the urinary bladder secondary to reported outside CT performed yesterday. Stomach/Bowel: Tiny hiatal hernia. Normal distal stomach. Moderate amount of stool within the rectum. Scattered colonic diverticula. Normal terminal ileum. Normal small bowel caliber. Vascular/Lymphatic: Aortic atherosclerosis. Diffuse portal vein thrombosis again identified. Thrombus involves the superior aspect of the superior mesenteric vein, chronic. New and progressive abdominal adenopathy. An aortocaval node measures 1.0 cm on 39/2 versus 7 mm on 02/19/2020 (when remeasured). A necrotic node along the celiac, just posterior to the pancreatic body measures 1.9 cm on 30/2 and is new. Developing adenopathy in the gastrohepatic ligament including at 1.0 cm on 24/2. More ill-defined hypoattenuating lesions in the peripancreatic space, including at 2.6 cm in the portacaval space on 29/2, increased from 2.3 cm on 02/19/2020. No pelvic sidewall adenopathy. Reproductive: Hysterectomy.  No adnexal mass. Other: Since 02/19/2020, development of small volume abdominopelvic ascites. Peritoneal nodularity, including within the pelvic cul-de-sac on 78/2 and along the right-side of the abdomen on 39/2. No free intraperitoneal air. A new periumbilical nodule or node measures 1.9 cm on 57/2. Musculoskeletal: No acute osseous abnormality. Trace L4-5 anterolisthesis. Degenerate disc disease at the lumbosacral junction. IMPRESSION: 1. When compared to multiple prior exams, including most direct comparison to the  02/19/2020 CT, new or progressive metastatic disease, most definite involving necrotic abdominal nodes and the peritoneum as detailed above. Developing periumbilical nodularity, also likely metastatic. 2. Progressive liver lesions, also most likely indicative of metastatic disease. In the setting of portal vein thrombosis, progressive abscesses are possible but less likely. 3. Abnormal appearance of the pancreas, at least partially felt to be attributed to chronic pancreatitis. Developing soft tissue fullness within the pancreatic tail, with considerations of primary adenocarcinoma, metastatic disease, or superimposed acute pancreatitis. Peripancreatic hypoattenuating lesions, some of which are increased compared to 02/19/2020. These could be secondary to prior pancreatitis or also represent necrotic nodal metastasis. 4. Left breast mass in the setting of prior lumpectomy for carcinoma. Suspicious for new breast primary, incompletely imaged. 5.  Possible constipation. 6. New small volume abdominopelvic ascites. 7. Similar small right pleural effusion compared to the MRI of 09/26/2020. Electronically Signed: By: Abigail Miyamoto M.D. On: 10/03/2020 12:32   MR ABDOMEN MRCP WO CONTRAST  Result Date: 09/27/2020 CLINICAL DATA:  Pancreatitis. Pancreatic and liver lesions on prior MRI. Abdominal pain. EXAM: MRI ABDOMEN WITHOUT CONTRAST  (  INCLUDING MRCP) TECHNIQUE: Multiplanar multisequence MR imaging of the abdomen was performed. Heavily T2-weighted images of the biliary and pancreatic ducts were obtained, and three-dimensional MRCP images were rendered by post processing. COMPARISON:  03/07/2020 MRI FINDINGS: Despite efforts by the technologist and patient, severe motion artifact is present on today's exam and could not be eliminated. This reduces exam sensitivity and specificity. Due to pain, the patient terminated the exam prior to obtaining full diagnostic sequences. Lower chest: Small right pleural effusion is new  compared to prior MRI. Hepatobiliary: There is been evolution of the previous regions of stippled T2 signal hyperintensity scattered in the liver to current appearance of hazy accentuated T2 signal for example on image 9 of series 4. On biopsy, these were felt to represent areas of periductal fibrosis, chronic lymphoplasmacytic infiltrate, hemosiderin deposition, and regenerative hyperplasia suggesting finding secondary to portal vein thrombosis. This process is significantly expanded throughout segment 4a and 4B of the liver which not demonstrates diffuse hazy accentuated T2 signal as shown on image 14 series 4. There is associated reduced T1 signal in these regions of involvement. No steatosis. There is some associated restriction of diffusion in these regions of involvement. Unfortunately the MRCP images are severely blurred. No obvious extrahepatic biliary dilatation. There is some mild narrowing of the common hepatic duct along the porta hepatis for example on image 16 series 3, probably due to surrounding soft tissue swelling and possibly periportal fibrosis. As before, there is high T2 signal intensity material throughout the somewhat dilated intrahepatic portal venous system probably from thrombosis. Periportal edema noted. Pancreas: Peripancreatic edema noted with stippled accentuated T2 signal in the pancreas, and substantially dilated segments of the dorsal pancreatic duct especially in the pancreatic body, up to 1.0 cm in diameter. T2 signal hyperintensity partially involving the pancreatic head measuring about 4.5 by 1.6 by 2.6 cm, extending cephalad along the porta hepatis, I am uncertain whether this represents a thrombosed varix from the portal vein, pseudocyst, or a cystic pancreatic neoplasm. This appears roughly similar to the prior exam. Spleen:  Unremarkable Adrenals/Urinary Tract: Small renal lesions of varying complexity are present and probably a combination of simple and complex cysts,  although enhancement characteristics are not interrogated today. The adrenal glands appear normal. Stomach/Bowel: Unremarkable Vascular/Lymphatic: Aortoiliac atherosclerotic vascular disease. 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 Collaterals along the porta hepatis. Other: Ascites particularly along the paracolic gutters and in the pelvis (pelvic ascites seen on coronal images). Mesenteric edema mildly improved in the central mesentery compared to previous. Omental edema noted. There is hazy stranding in the root of the mesentery as on prior exams. Musculoskeletal: Lower lumbar spondylosis and degenerative disc disease. IMPRESSION: 1. Despite efforts by the technologist and patient, severe motion artifact is present on today's exam and could not be eliminated. The patient was also unable to complete the full sequence of the exam. This reduces exam sensitivity and specificity. 2. Evolution of the appearance in the liver with the previously stippled T2 signal hyperintensities now appearing as hazy increased T2 signal, and with substantially increased involvement throughout segment 4 and 4B of the liver. Based on prior biopsy results this likely represents combination of fibrosis, lymphoplasmacytic infiltrate, hemosiderin deposition, and periportal hyperplasia resulting from portal vein thrombosis. 3. 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 No obvious extrahepatic  biliary dilatation. There is some mild narrowing of the common hepatic duct along the porta hepatis, probably due to surrounding soft tissue swelling and possibly periportal fibrosis. 4. Peripancreatic edema with stippled accentuated T2 signal in the pancreas, and substantially dilated segments of the dorsal pancreatic duct  especially in the pancreatic body. 5. Lobulated T2 signal hyperintensity along the pancreatic head, possibly a pseudocyst or a thrombosed varix of the portal vein, less likely cystic pancreatic neoplasm. 6. Small right pleural effusion is new compared to previous MRI. 7. Reduced central mesenteric edema compared to previous, although there is still some peripancreatic edema as well as mesenteric edema and ascites. 8. Lower lumbar spondylosis and degenerative disc disease. Electronically Signed   By: Van Clines M.D.   On: 09/27/2020 16:11   US Paracentesis  Result Date: 10/09/2020 INDICATION: 75 year old female with history of breast cancer and abdominal pain found to have large volume ascites. EXAM: ULTRASOUND GUIDED left lower quadrant PARACENTESIS MEDICATIONS: None. COMPLICATIONS: None immediate. PROCEDURE: Informed written consent was obtained from the patient after a discussion of the risks, benefits and alternatives to treatment. A timeout was performed prior to the initiation of the procedure. Initial ultrasound scanning demonstrates a large amount of ascites within the left lower abdominal quadrant. The right lower abdomen was prepped and draped in the usual sterile fashion. 1% lidocaine was used for local anesthesia. Following this, a 6 Fr Safe-T-Centesis catheter was introduced. An ultrasound image was saved for documentation purposes. The paracentesis was performed. The catheter was removed and a dressing was applied. The patient tolerated the procedure well without immediate post procedural complication. FINDINGS: A total of approximately 3.05 L of translucent, straw-colored fluid was removed. Samples were sent to the laboratory as requested by the clinical team. IMPRESSION: Successful ultrasound-guided paracentesis yielding 3.05 liters of peritoneal fluid. Ruthann Cancer, MD [facility] Radiology Electronically Signed   By: Ruthann Cancer MD   On:  10/09/2020 15:53   DG Chest Portable 1 View  Result Date: 10/09/2020 CLINICAL DATA:  Altered mental status.  Rule out pneumonia EXAM: PORTABLE CHEST 1 VIEW COMPARISON:  10/04/2020 FINDINGS: Progression of right lower lobe airspace disease.  No effusion Left lung remains clear.  Negative for heart failure. IMPRESSION: Progression of right lower lobe airspace disease which may represent atelectasis or pneumonia. Electronically Signed   By: Franchot Gallo M.D.   On: 10/09/2020 10:11   [place] CORE BIOPSY (SOFT TISSUE)  Result Date: 10/04/2020 INDICATION: 75 year old woman with history of breast cancer presented to interventional radiology for biopsy of new right chest wall mass. EXAM: Ultrasound-guided biopsy of right chest wall mass MEDICATIONS: None. ANESTHESIA/SEDATION: Moderate (conscious) sedation was employed during this procedure. A total of Versed 2 mg and Fentanyl 100 mcg was administered intravenously. Moderate Sedation Time: 8 minutes. The patient's level of consciousness and vital signs were monitored continuously by radiology nursing throughout the procedure under my direct supervision. COMPLICATIONS: None immediate. PROCEDURE: Informed written consent was obtained from the patient after a thorough discussion of the procedural risks, benefits and alternatives. All questions were addressed. Maximal Sterile Barrier Technique was utilized including caps, mask, sterile gowns, sterile gloves, sterile drape, hand hygiene and skin antiseptic. A timeout was performed prior to the initiation of the procedure. Patient position supine on the ultrasound table. Right anterior lower chest wall skin prepped and draped in usual sterile fashion. Following local lidocaine administration, 18 gauge biopsy needle was advanced into the right anterior chest wall mass, and four  cores were obtained utilizing continuous ultrasound guidance. Samples were sent to pathology in formalin. Needle removed and hemostasis achieved with 5  minutes of manual compression. Post procedure ultrasound images showed no evidence of significant hemorrhage. IMPRESSION: Ultrasound-guided biopsy of right anterior chest wall mass. Electronically Signed   By: Miachel Roux M.D.   On: 10/04/2020 17:04    PERFORMANCE STATUS (ECOG) : 2 - Symptomatic, <50% confined to bed  Review of Systems Unless otherwise noted, a complete review of systems is negative.  Physical Exam General: NAD Pulmonary: Unlabored Extremities: no edema, no joint deformities Skin: no rashes Neurological: Weakness but otherwise nonfocal  IMPRESSION: I met with patient and daughter to discuss goals.  Introduced palliative care services and attempted to establish therapeutic rapport.  Patient seems to have a reasonable understanding of her current diagnosis, work-up, and plan for treatment.  She has agreed to pursue chemotherapy, which is scheduled for later today.  However, patient makes it clear that she has had negative experiences with chemotherapy from other family members (mother and sister) who ultimately died from cancer.  Patient is interested in pursuing treatment with hope that it will prolong her life but reserves the right to discontinue care should symptoms become burdensome or her quality of life become negatively impacted.  I assured her that we would support her decision making no matter what she chooses.  Her daughter also verbalized support for her decision-making.  Currently, patient denies any distressing symptomatic complaints.  She was having abdominal pain but this is improved following paracentesis yesterday.  Patient does not have any advance directives.  She says that she is given some thought to this and likely would appoint her oldest daughter to be her healthcare proxy.  Will consult the chaplain to provide ACP documents while she is in the hospital.  We also discussed CODE STATUS.  Patient says that we all have to face end-of-life at some point and  that she would prefer just to be kept in peace when it is her time.  She verbalized that she is not interested in pursuing any aggressive resuscitation or life prolonging measures at end-of-life.  Both patient and daughter verbalized agreement DNR/DNI.  PLAN: -Continue current scope of treatment -Chaplain consult for ACP -DNR/DNI -Will plan to follow patient in the cancer center following discharge from the hospital  Case and plan discussed with Dr. Rogue Bussing   Time Total: 60 minutes  Visit consisted of counseling and education dealing with the complex and emotionally intense issues of symptom management and palliative care in the setting of serious and potentially life-threatening illness.Greater than 50%  of this time was spent counseling and coordinating care related to the above assessment and plan.  Signed by: Altha Harm, PhD, NP-C

## 2020-10-10 NOTE — Progress Notes (Signed)
Initial Nutrition Assessment  DOCUMENTATION CODES:   Not applicable  INTERVENTION:   -Ensure Enlive po TID, each supplement provides 350 kcal and 20 grams of protein -MVI with minerals daily -Liberalize diet to regular  NUTRITION DIAGNOSIS:   Increased nutrient needs related to cancer and cancer related treatments as evidenced by estimated needs.  GOAL:   Patient will meet greater than or equal to 90% of their needs  MONITOR:   PO intake,Supplement acceptance,Diet advancement,Labs,Weight trends,Skin,I & O's  REASON FOR ASSESSMENT:   Malnutrition Screening Tool    ASSESSMENT:   Theresa Andrews is a 75 y.o. female with medical history significant for left-sided breast cancer status post lumpectomy, radiation and chemotherapy, history of hyperparathyroidism, hypertension, anxiety disorder, hypocalcemia and dyslipidemia who presents to the ER for evaluation of persistent abdominal pain as well as mental status changes.  Pt with abdominal pain concerning for possible SBP.   Reviewed I/O's: +220 ml x 24 hours  Oncology has evaluated pt; pt with possible pancreatic vs left breast recurrence. Palliative care has been pt; pt desiring full scope care at this time.   Pt out of room at time of visit. No family at bedside to provide additional history.   Pt with poor oral intake; noted meal completion 30-50%.  Reviewed wt hx; pt has experienced a 7.3% wt loss over the past 6 months. While this is not significant for time frame, it is concerning given pt's recent cancer diagnosis and history of poor oral intake. Highly suspect pt with malnutrition, however, unable to identify at this time. Pt would benefit from addition of oral nutrition supplements.   Medications reviewed and include vitamin D3 and miralax.   Labs reviewed.   Diet Order:   Diet Order            Diet 2 gram sodium Room service appropriate? Yes; Fluid consistency: Thin  Diet effective now                  EDUCATION NEEDS:   No education needs have been identified at this time  Skin:  Skin Assessment: Skin Integrity Issues: Skin Integrity Issues:: Incisions Incisions: closed upper sternum  Last BM:  10/09/20  Height:   Ht Readings from Last 1 Encounters:  10/09/20 5\' 4"  (1.626 m)    Weight:   Wt Readings from Last 1 Encounters:  10/09/20 74.8 kg    Ideal Body Weight:  54.5 kg  BMI:  Body mass index is 28.32 kg/m.  Estimated Nutritional Needs:   Kcal:  2050-2250  Protein:  110-125 grams  Fluid:  > 2 L    Loistine Chance, RD, LDN, [place] Registered Dietitian II Certified Diabetes Care and Education Specialist Please refer to [facility] for RD and/or RD on-call/weekend/after hours pager

## 2020-10-10 NOTE — Progress Notes (Signed)
Theresa Andrews   DOB:1946/06/23   SP#:[id number]    Subjective: Patient denies any fevers or chills.  Appetite is fair.  Abdominal pain improved since paracentesis.  No nausea no vomiting.  Objective:  Vitals:   10/10/20 0458 10/10/20 0851  BP: 106/64 115/67  Pulse: 95 82  Resp: 18 18  Temp: 97.6 F (36.4 C) 97.7 F (36.5 C)  SpO2: 98% 100%     Intake/Output Summary (Last 24 hours) at 10/10/2020 0946 Last data filed at 10/09/2020 1853 Gross per 24 hour  Intake 220.08 ml  Output --  Net 220.08 ml    Physical Exam Constitutional:      Comments: Patient resting in the bed no acute distress.  Alone.  HENT:     Head: Normocephalic and atraumatic.     Mouth/Throat:     Pharynx: No oropharyngeal exudate.  Eyes:     Pupils: Pupils are equal, round, and reactive to light.  Cardiovascular:     Rate and Rhythm: Normal rate and regular rhythm.  Pulmonary:     Effort: No respiratory distress.     Breath sounds: No wheezing.  Abdominal:     General: Bowel sounds are normal. There is no distension.     Palpations: Abdomen is soft. There is no mass.     Tenderness: There is no abdominal tenderness. There is no guarding or rebound.     Comments: Improvement of the abdominal distention noted.  No acute tenderness noted.  Musculoskeletal:        General: No tenderness. Normal range of motion.     Cervical back: Normal range of motion and neck supple.  Skin:    General: Skin is warm.  Neurological:     Mental Status: She is alert and oriented to person, place, and time.  Psychiatric:        Mood and Affect: Affect normal.      Labs:  Lab Results  Component Value Date   WBC 13.3 (H) 10/10/2020   HGB 9.6 (L) 10/10/2020   HCT 28.9 (L) 10/10/2020   MCV 81.6 10/10/2020   PLT 198 10/10/2020   NEUTROABS 11.0 (H) 10/09/2020    Lab Results  Component Value Date   NA 139 10/10/2020   K 4.0 10/10/2020   CL 110 10/10/2020   CO2 20 (L) 10/10/2020    Studies:  CT Head W or Wo  Contrast  Result Date: 10/09/2020 CLINICAL DATA:  Mental status change EXAM: CT HEAD WITHOUT AND WITH CONTRAST TECHNIQUE: Contiguous axial images were obtained from the base of the skull through the vertex without and with intravenous contrast CONTRAST:  11m OMNIPAQUE IOHEXOL 300 MG/ML  SOLN COMPARISON:  10/03/2020 FINDINGS: Brain: There is no acute intracranial hemorrhage, mass, mass effect, or edema. No abnormal enhancement. Gray-white differentiation is preserved. There is no extra-axial fluid collection. Ventricles and sulci are stable in size and configuration. Patchy hypoattenuation in the supratentorial white matter is nonspecific but likely reflects stable mild chronic microvascular ischemic changes. Vascular: There is atherosclerotic calcification at the skull base. Skull: Calvarium is unremarkable. Sinuses/Orbits: No acute finding. Other: None. IMPRESSION: No acute intracranial abnormality.  No abnormal enhancement. Electronically Signed   By: PMacy MisM.D.   On: 10/09/2020 09:55   CT ABDOMEN PELVIS W CONTRAST  Result Date: 10/09/2020 CLINICAL DATA:  Confusion, abdominal bloating, pain EXAM: CT ABDOMEN AND PELVIS WITH CONTRAST TECHNIQUE: Multidetector CT imaging of the abdomen and pelvis was performed using the standard protocol following bolus administration  of intravenous contrast. CONTRAST:  138m OMNIPAQUE IOHEXOL 300 MG/ML  SOLN COMPARISON:  10/06/2020 FINDINGS: Lower chest: Moderate right pleural effusion. Compressive atelectasis in the right lower lobe. Stable mass in a right lower costochondral junction is unchanged. Postsurgical changes in the left breast. This is unchanged. Hepatobiliary: No change in the multiple masses throughout the liver presumably metastases. Dilated and thrombosed portal vein again noted with cavernous transformation, stable. Pancreas: Stable dilated pancreatic duct. Stable pancreatic body and pancreatic tail masses. Spleen: No focal abnormality.  Normal size.  Adrenals/Urinary Tract: Stable low-density lesion off the upper pole of the left kidney measuring 1.8 cm. No hydronephrosis. Adrenal glands and urinary bladder unremarkable. Stomach/Bowel: Moderate stool throughout the colon. No evidence of bowel obstruction. Vascular/Lymphatic: Aortic atherosclerosis. Upper abdominal adenopathy again noted, the largest a portacaval node measuring up to 3.9 cm. Retroperitoneal adenopathy, stable. Reproductive: Prior hysterectomy Other: Large volume ascites in the abdomen and pelvis. Nodularity throughout the mesentery and omentum, most pronounced in the right abdomen and upper abdomen most likely reflects malignant ascites and peritoneal spread of disease. This may also be reflected within umbilical nodule, stable. Musculoskeletal: No acute bony abnormality or focal lesion. IMPRESSION: Extensive metastatic disease throughout the abdomen as described above. Findings stable since prior study. Chronic portal vein thrombosis with cavernous transformation, stable. Upper abdominal and retroperitoneal adenopathy, stable. Large volume ascites, likely malignant ascites, stable. No evidence of bowel obstruction. Stable moderate right pleural effusion with right lower lobe atelectasis. Electronically Signed   By: KRolm BaptiseM.D.   On: 10/09/2020 10:15   [place]  Result Date: 10/09/2020 INDICATION: 75year old female with history of breast cancer and abdominal pain found to have large volume ascites. EXAM: ULTRASOUND GUIDED left lower quadrant PARACENTESIS MEDICATIONS: None. COMPLICATIONS: None immediate. PROCEDURE: Informed written consent was obtained from the patient after a discussion of the risks, benefits and alternatives to treatment. A timeout was performed prior to the initiation of the procedure. Initial ultrasound scanning demonstrates a large amount of ascites within the left lower abdominal quadrant. The right lower abdomen was prepped and draped in the usual sterile  fashion. 1% lidocaine was used for local anesthesia. Following this, a 6 Fr Safe-T-Centesis catheter was introduced. An ultrasound image was saved for documentation purposes. The paracentesis was performed. The catheter was removed and a dressing was applied. The patient tolerated the procedure well without immediate post procedural complication. FINDINGS: A total of approximately 3.05 L of translucent, straw-colored fluid was removed. Samples were sent to the laboratory as requested by the clinical team. IMPRESSION: Successful ultrasound-guided paracentesis yielding 3.05 liters of peritoneal fluid. DRuthann Cancer MD [facility] Electronically Signed   By: DRuthann CancerMD   On: 10/09/2020 15:53   DG Chest Portable 1 View  Result Date: 10/09/2020 CLINICAL DATA:  Altered mental status.  Rule out pneumonia EXAM: PORTABLE CHEST 1 VIEW COMPARISON:  10/04/2020 FINDINGS: Progression of right lower lobe airspace disease.  No effusion Left lung remains clear.  Negative for heart failure. IMPRESSION: Progression of right lower lobe airspace disease which may represent atelectasis or pneumonia. Electronically Signed   By: CFranchot GalloM.D.   On: 10/09/2020 10:11    Carcinoma of unknown primary ([facility] #75year old female patient with remote history of breast cancer; and history of portal vein thrombosis unclear etiology-on lovenox; new diagnosis of metastatic carcinoma-unknown primary is currently admitted to hospital for worsening abdominal pain/ascites.  #Carcinoma of unknown primary-stage IV -possible pancreatic versus left breast/recurrence-currently awaiting  ER/PR HER-2/neu status on the chest wall soft tissue specimen.  However given the bulky disease symptomatic disease-proceed with chemotherapy carbotaxol.  Patient understands that treatments are palliative not curative.  #Worsening abdominal pain/ascites-status post paracentesis improved.  No  evidence of infection.  Defer to primary team for discontinuation of ceftriaxone.  Discussed with Dr. Dwyane Dee.  #Left breast mass-question recurrent malignancy-we will plan outpatient biopsy.  #Progressive portal vein thrombosis-likely secondary to underlying malignancy.  Continue Lovenox 1 mg/kg SQ twice daily  #Given the metastatic disease-recommend palliative care evaluation discussed with Josh Borders.   #Discussed with patient and her daughter, Mickel Baas over the phone regarding above plan of care.  They understand the potential risk versus benefits of chemotherapy; and elected proceed with chemotherapy.  Discussed with pharmacist/chemotherapy nurse at the cancer center.  If patient is clinically stable pain under control patient will be discharged home tomorrow- 3/18.  Recommend patient keep her appointment as planned on 3/21-at the cancer center.  Dr. Janese Banks will be available for any questions or concerns.  # 40 minutes face-to-face with the patient discussing the above plan of care; more than 50% of time spent on prognosis/ natural history; counseling and coordination.   Cammie Sickle, MD 10/10/2020  9:46 AM

## 2020-10-10 NOTE — Progress Notes (Signed)
Patient transported from [facility] to the [facility] for new Taxol/Carboplatin.  Patient tolerated treatment well, report called to [place], [place] daughter Denman George updated.  Patient transported back to room in stable condition.

## 2020-10-11 ENCOUNTER — Encounter: Payer: Self-pay | Admitting: *Deleted

## 2020-10-11 DIAGNOSIS — R1011 Right upper quadrant pain: Secondary | ICD-10-CM | POA: Diagnosis not present

## 2020-10-11 DIAGNOSIS — Z515 Encounter for palliative care: Secondary | ICD-10-CM | POA: Diagnosis not present

## 2020-10-11 DIAGNOSIS — G9341 Metabolic encephalopathy: Secondary | ICD-10-CM | POA: Diagnosis present

## 2020-10-11 LAB — CBC
HCT: 29.4 % — ABNORMAL LOW (ref 36.0–46.0)
Hemoglobin: 9.2 g/dL — ABNORMAL LOW (ref 12.0–15.0)
MCH: 26.2 pg (ref 26.0–34.0)
MCHC: 31.3 g/dL (ref 30.0–36.0)
MCV: 83.8 fL (ref 80.0–100.0)
Platelets: 148 10*3/uL — ABNORMAL LOW (ref 150–400)
RBC: 3.51 MIL/uL — ABNORMAL LOW (ref 3.87–5.11)
RDW: 17.3 % — ABNORMAL HIGH (ref 11.5–15.5)
WBC: 9.5 10*3/uL (ref 4.0–10.5)
nRBC: 0.3 % — ABNORMAL HIGH (ref 0.0–0.2)

## 2020-10-11 LAB — BASIC METABOLIC PANEL
Anion gap: 8 (ref 5–15)
BUN: 28 mg/dL — ABNORMAL HIGH (ref 8–23)
CO2: 21 mmol/L — ABNORMAL LOW (ref 22–32)
Calcium: 8.7 mg/dL — ABNORMAL LOW (ref 8.9–10.3)
Chloride: 109 mmol/L (ref 98–111)
Creatinine, Ser: 0.67 mg/dL (ref 0.44–1.00)
GFR, Estimated: >60 mL/min (ref >60)
Glucose, Bld: 121 mg/dL — ABNORMAL HIGH (ref 70–99)
Potassium: 4.6 mmol/L (ref 3.5–5.1)
Sodium: 138 mmol/L (ref 135–145)

## 2020-10-11 LAB — CANCER ANTIGEN 19-9: CA 19-9: 54860 U/mL — ABNORMAL HIGH (ref 0–35)

## 2020-10-11 LAB — MAGNESIUM: Magnesium: 2.3 mg/dL (ref 1.7–2.4)

## 2020-10-11 LAB — PHOSPHORUS: Phosphorus: 4 mg/dL (ref 2.5–4.6)

## 2020-10-11 LAB — CYTOLOGY - NON PAP

## 2020-10-11 MED ORDER — SENNA 8.6 MG PO TABS: 1.0000 | ORAL_TABLET | Freq: Every day | ORAL | Status: DC | PRN

## 2020-10-11 MED ORDER — SODIUM CHLORIDE 0.9 % IV SOLN
INTRAVENOUS | Status: DC | PRN
  Administered 2020-10-11: 250 mL via INTRAVENOUS

## 2020-10-11 MED ORDER — ENSURE ENLIVE PO LIQD
237.0000 mL | Freq: Two times a day (BID) | ORAL | Status: DC
  Administered 2020-10-11 – 2020-10-12 (×4): 237 mL via ORAL

## 2020-10-11 NOTE — Progress Notes (Signed)
[facility] Progress Note  Patient: Theresa Andrews    LZJ:[id number]  DOA: 10/09/2020     Date of Service: the patient was seen and examined on 10/11/2020  Chief Complaint  Patient presents with  . Constipation   Brief hospital course: MICHALINA CALBERT is a 75 y.o. female with medical history significant for left-sided breast cancer status post lumpectomy, radiation and chemotherapy, history of hyperparathyroidism, hypertension, anxiety disorder, hypocalcemia and dyslipidemia who presents to the ER for evaluation of persistent abdominal pain as well as mental status changes.  ED w/up Labs show sodium 139, potassium 4.2, chloride 107, bicarb 24, glucose 119, BUN 28, creatinine 0.70, calcium 9.6, alkaline phosphatase 97, albumin 3.0, lipase 24, AST 25, ALT 13, total protein 6.8, ammonia level, white count 13.7, hemoglobin 10.0, hematocrit 32.4, MCV 84.4, RDW 17.4, platelet count 265. CT scan of abdomen and pelvis shows extensive metastatic disease throughout the abdomen as described above. Findings stable since prior study. Chronic portal vein thrombosis with cavernous transformation, Stable. Upper abdominal and retroperitoneal adenopathy, stable. Large volume ascites, likely malignant ascites, stable. No evidence of bowel obstruction. Stable moderate right pleural effusion with right lower lobe atelectasis. Chest x-ray reviewed by me shows progression of right lower lobe airspace disease which may represent atelectasis or pneumonia. CT scan of the head without contrast shows no acute intracranial abnormality. No abnormal enhancement. Twelve-lead EKG reviewed by me shows sinus rhythm with nonspecific T wave abnormality.    Assessment and Plan: Principal Problem:   Abdominal pain Active Problems:   Anxiety disorder   Benign essential HTN   Malignant ascites    Metabolic encephalopathy, patient is having episodes of off-and-on confusion for the past 3 weeks as per family Patient is  still mildly confused, not back to her baseline CT scan negative for brain metastasis Continue supportive care and redirection   Abdominal pain most likely secondary to extensive metastatic disease noted throughout the abdomen.  Peritoneal fluid negative for SBP Patient had a recent biopsy done of the right side chest wall mass consistent with adenocarcinoma of the pancreas She also has large volume ascites most likely metastatic S/p paracentesis 3 L fluid was tapped, cell count last, ruled out SBP,  fluid culture is still pending and cytology pending Continue Rocephin 2 g IV daily Pain control Discontinue ceftriaxone tomorrow if fluid culture negative and WBC count within normal range   Newly diagnosed metastatic adenocarcinoma of the pancreas Consult oncology Carcinoma of unknown primary-stage IV -possible pancreatic versus left breast/recurrence-currently awaiting ER/PR HER-2/neu status on the chest wall soft tissue specimen.  However given the bulky disease symptomatic disease-proceed with chemotherapy carbotaxol.  Patient understands that treatments are palliative not curative. Patient was cleared for discharge and follow-up on 3/21 at [facility]  Hypertension Continue ibesartan, metoprolol and amlodipine   Depression/anxiety Continue Paxil   History of portal vein thrombosis Continue therapeutic dose Lovenox q12hr PTA dose  Body mass index is 28.32 kg/m.   Poor prognosis, palliative care consulted for goals of care discussion.  CODE STATUS was changed to DNR.     Diet: Heart healthy DVT Prophylaxis: Subcutaneous Lovenox   Advance goals of care discussion: DNR  Family Communication: family was present at bedside, at the time of interview.  The pt provided permission to discuss medical plan with the family. Opportunity was given to ask question and all questions were answered satisfactorily.   Disposition:  Pt is from home, admitted with abdominal pain and  AMS, encephalopathy, abdominal pain  improved but patient is still having mild encephalopathy, patient's family is not comfortable to take her home due to recurrent episodes of AMS in the past 3 weeks, which precludes a safe discharge. Discharge to home, when pain and mental status will improve, most likely tomorrow a.m.  Subjective: No significant overnight issues, patient's mental status is improving but still seems to be little confused and not back to her baseline.  Patient abdominal pain is better now after paracentesis, denied any other active issues.  Physical Exam: General:  alert oriented to place and person.  Appear in mild distress, affect appropriate Eyes: PERRLA ENT: Oral Mucosa Clear, moist  Neck: no JVD,  Cardiovascular: S1 and S2 Present, no Murmur,  Respiratory: good respiratory effort, Bilateral Air entry equal and Decreased, no Crackles, no wheezes Abdomen: Bowel Sound present, Soft and no tenderness,  Skin: no rashes Extremities: no Pedal edema, no calf tenderness Neurologic: without any new focal findings Gait not checked due to patient safety concerns  Vitals:   10/10/20 1936 10/11/20 0400 10/11/20 0819 10/11/20 1158  BP: 118/75 (!) 117/94 130/82 115/66  Pulse: 94 84 83 88  Resp: 16 16 18 17   Temp: 97.9 F (36.6 C) 98.7 F (37.1 C) 98.3 F (36.8 C) 98 F (36.7 C)  TempSrc: Oral Oral Oral   SpO2: 96% 97% 97% 96%  Weight:      Height:        Intake/Output Summary (Last 24 hours) at 10/11/2020 1510 Last data filed at 10/10/2020 2047 Gross per 24 hour  Intake 100 ml  Output -  Net 100 ml   Filed Weights   10/09/20 0754  Weight: 74.8 kg    Data Reviewed: I have personally reviewed and interpreted daily labs, tele strips, imagings as discussed above. I reviewed all nursing notes, pharmacy notes, vitals, pertinent old records I have discussed plan of care as described above with RN and patient/family.  CBC: Recent Labs  Lab 10/06/20 1419  10/09/20 0821 10/10/20 0523 10/11/20 0435  WBC 16.6* 13.7* 13.3* 9.5  NEUTROABS  --  11.0*  --   --   HGB 10.3* 10.0* 9.6* 9.2*  HCT 32.9* 32.4* 28.9* 29.4*  MCV 84.4 84.4 81.6 83.8  PLT 235 265 198 081*   Basic Metabolic Panel: Recent Labs  Lab 10/06/20 1419 10/09/20 0821 10/10/20 0523 10/11/20 0435  NA 139 139 139 138  K 4.2 4.3 4.0 4.6  CL 108 107 110 109  CO2 22 24 20* 21*  GLUCOSE 132* 119* 121* 121*  BUN 24* 28* 25* 28*  CREATININE 0.72 0.70 0.62 0.67  CALCIUM 9.8 9.6 9.1 8.7*  MG  --   --  2.2 2.3  PHOS  --   --  3.0 4.0    Studies: No results found.  Scheduled Meds: . amLODipine  10 mg Oral Daily  . cholecalciferol  1,000 Units Oral Daily  . enoxaparin (LOVENOX) injection  1 mg/kg Subcutaneous Q12H  . feeding supplement  237 mL Oral BID BM  . irbesartan  300 mg Oral Daily  . metoprolol succinate  12.5 mg Oral Daily  . PARoxetine  10 mg Oral Daily  . polyethylene glycol  17 g Oral Daily  . rosuvastatin  10 mg Oral Daily   Continuous Infusions: . cefTRIAXone (ROCEPHIN)  IV Stopped (10/10/20 2047)   PRN Meds: lactulose, morphine injection, ondansetron **OR** ondansetron (ZOFRAN) IV, senna  Time spent: 35 minutes  Author: Val Riles. MD [facility] 10/11/2020 3:10 PM  To reach On-call, see care teams to locate the attending and reach out to them via [email or web address]. If 7PM-7AM, please contact night-coverage If you still have difficulty reaching the attending provider, please page the [facility] (Director on Call) for [facility] on amion for assistance.

## 2020-10-11 NOTE — Care Management Important Message (Signed)
Important Message  Patient Details  Name: Theresa Andrews MRN: [id number] Date of Birth: 07/23/1946   Medicare Important Message Given:  N/A - LOS <3 / Initial given by admissions  Initial Medicare IM given by Donnal Moat, Patient Access Associate on 10/10/2020 at 10:00am   [facility] 10/11/2020, 8:34 AM

## 2020-10-11 NOTE — Plan of Care (Signed)
  Problem: Health Behavior/Discharge Planning: Goal: Ability to manage health-related needs will improve Outcome: Progressing   Problem: Clinical Measurements: Goal: Ability to maintain clinical measurements within normal limits will improve Outcome: Progressing Goal: Will remain free from infection Outcome: Progressing Goal: Cardiovascular complication will be avoided Outcome: Progressing   Problem: Activity: Goal: Risk for activity intolerance will decrease Outcome: Progressing   Problem: Nutrition: Goal: Adequate nutrition will be maintained Outcome: Progressing   Problem: Coping: Goal: Level of anxiety will decrease Outcome: Progressing   Problem: Pain Managment: Goal: General experience of comfort will improve Outcome: Progressing   Problem: Safety: Goal: Ability to remain free from injury will improve Outcome: Progressing

## 2020-10-11 NOTE — Plan of Care (Signed)
Alert and oriented to self. Denies pain or discomfort. Returned from chemo infusion at beginning of shift. Declined meal. IV antibiotic infused and tolerated well. PT needs adls assistance room staff. Bed alarm in use. Chemo-precautions in effect for 48 hrs following treatment. No concerns at this time.  Problem: Health Behavior/Discharge Planning: Goal: Ability to manage health-related needs will improve Outcome: Progressing   Problem: Clinical Measurements: Goal: Ability to maintain clinical measurements within normal limits will improve Outcome: Progressing Goal: Will remain free from infection Outcome: Progressing Goal: Cardiovascular complication will be avoided Outcome: Progressing   Problem: Activity: Goal: Risk for activity intolerance will decrease Outcome: Progressing   Problem: Nutrition: Goal: Adequate nutrition will be maintained Outcome: Progressing   Problem: Coping: Goal: Level of anxiety will decrease Outcome: Progressing   Problem: Pain Managment: Goal: General experience of comfort will improve Outcome: Progressing   Problem: Safety: Goal: Ability to remain free from injury will improve Outcome: Progressing

## 2020-10-11 NOTE — Progress Notes (Signed)
Theresa Andrews  [phone] [phone]   Name: Theresa Andrews Date: 10/11/2020 MRN: [id number]  DOB: 1946/07/04  Patient Care Team: Steele Sizer, MD as PCP - General (Family Medicine) Cammie Sickle, MD as Consulting Physician (Oncology) Corey Skains, MD as Consulting Physician (Cardiology) Gabriel Carina Betsey Holiday, MD as Consulting Physician (Endocrinology) Baxter Kail, MD as Consulting Physician (Dermatology) Anell Barr, OD as Consulting Physician (Optometry) Pieter Partridge, MD as Referring Physician (Dermatology) Vern Claude, LCSW as Social Worker Neldon Labella, RN as Registered Nurse Michaelle Birks, Glean Salvo, [facility] (Pharmacist)    REASON FOR CONSULTATION: Theresa Andrews is a 75 y.o. female with multiple medical problems including history of left-sided breast cancer status post lumpectomy/XRT/chemotherapy, history of parathyroidism, hypertension, anxiety, and hyperlipidemia.  Patient was admitted to the hospital on 3/72/22 with persistent abdominal pain and altered mental status.  She was recently hospitalized 10/03/2020-10/04/2020 with similar complaints.  CT of the abdomen and pelvis revealed new progressive metastatic disease involving the pancreas.  She underwent biopsy with pathology showing poorly differentiated carcinoma of unknown primary.  Palliative care was consulted help address goals..    CODE STATUS: DNR  PAST MEDICAL HISTORY: Past Medical History:  Diagnosis Date  . Anxiety   . Breast cancer ([place])    pT2 pN0 cM0 (stage IIA) invasive mammary carcinoma of left outer breast status post lumpectomy and sentinel node study on July 14, 2010  . CAD (coronary artery disease)   . Depression   . H/O hypokalemia   . History of chemotherapy   . History of MI (myocardial infarction)   . Hyperglycemia   . Hyperlipidemia   . Hypertension   . Peripheral neuropathy due to chemotherapy ([place])   . Personal  history of chemotherapy 2011   left breast ca  . Personal history of radiation therapy 2001   left breast ca    PAST SURGICAL HISTORY:  Past Surgical History:  Procedure Laterality Date  . ABDOMINAL HYSTERECTOMY    . BREAST BIOPSY Left 2008   neg  . BREAST BIOPSY Left 03/05/2010   invasive mammary carcinoma  . BREAST LUMPECTOMY Left 07/14/2010   INVASIVE MAMMARY CARCINOMA WITH PROMINENT INFILTRATIVE PATTERN, negative margins  . CHOLECYSTECTOMY    . COLONOSCOPY WITH PROPOFOL N/A 11/22/2015   Procedure: COLONOSCOPY WITH PROPOFOL;  Surgeon: Hulen Luster, MD;  Location: [facility] ENDOSCOPY;  Service: Gastroenterology;  Laterality: N/A;  . CORONARY ANGIOPLASTY WITH STENT PLACEMENT  2008    HEMATOLOGY/ONCOLOGY HISTORY:  Oncology History Overview Note  # AUG 2011- LEFT BREAST CA Stage II ER/PR- Pos; her 2 NEG; s/p neo-adj ACx4- poor response; s/p lumpec [Dr.Arru]- ypT2 [2.5cm]ypsN-0/2; Taxol weekly 9/12 [sec to PN]; finished march 2012. Arimidex-May 2012; Aug 2013- changed to Aug 2013; sep 2013- Started Tam; Mammo- in April 2017.   # Declines extended anti-hormone therapy; # Feb 2017- Bone scan- Negative [hypercalcemia] ;   # AUG 2021- liver Biopsy- LIVER; BIOPSY:[[facility] II OPINION] - LIVER PARENCHYMA WITH ECTATIC PORTAL VEINS WITH HERNIATION AND FOCI SUGGESTIVE OF ORGANIZING THROMBI, PERIDUCTAL FIBROSIS WITH CHRONIC  LYMPHOPLASMACYTIC INFILTRATE, HEMOSIDERIN DEPOSITION AND PERIPORTAL  REGENERATIVE HYPERPLASIA, SUGGESTIVE OF CHANGES SECONDARY TO PORTAL VEIN  THROMBOSIS (CLINICAL). - FEATURES OF OBSTRUCTIVE BILIARY CHANGES. - NO EVIDENCE OF MALIGNANCY IS SEEN.   # Hypercalcemia- chronic [2016]; primary hyperparathyrdoisim; improved after stopping HCTZ.    # PN- G-1  DIAGNOSIS: breast cancer  STAGE:  II       ;GOALS: cure  CURRENT/MOST RECENT THERAPY: surviellance    Breast CA ([place])  10/29/2014 Initial Diagnosis   Breast CA ([place])   Carcinoma of overlapping sites of left breast in female,  estrogen receptor positive ([place])    ALLERGIES:  is allergic to tetanus toxoids and shellfish allergy.  MEDICATIONS:  Current Facility-Administered Medications  Medication Dose Route Frequency Provider Last Rate Last Admin  . amLODipine (NORVASC) tablet 10 mg  10 mg Oral Daily Agbata, Tochukwu, MD   10 mg at 10/10/20 0931  . cefTRIAXone (ROCEPHIN) 2 g in sodium chloride 0.9 % 100 mL IVPB  2 g Intravenous Q24H Collier Bullock, MD   Stopped at 10/10/20 2047  . cholecalciferol (VITAMIN D3) tablet 1,000 Units  1,000 Units Oral Daily Agbata, Tochukwu, MD   1,000 Units at 10/10/20 0931  . enoxaparin (LOVENOX) injection 75 mg  1 mg/kg Subcutaneous Q12H Val Riles, MD   75 mg at 10/10/20 2017  . feeding supplement (ENSURE ENLIVE / ENSURE PLUS) liquid 237 mL  237 mL Oral BID BM Walaa Carel, Kirt Boys, NP      . irbesartan (AVAPRO) tablet 300 mg  300 mg Oral Daily Agbata, Tochukwu, MD   300 mg at 10/10/20 0931  . lactulose (CHRONULAC) 10 GM/15ML solution 20 g  20 g Oral Daily PRN Agbata, Tochukwu, MD      . metoprolol succinate (TOPROL-XL) 24 hr tablet 12.5 mg  12.5 mg Oral Daily Agbata, Tochukwu, MD   12.5 mg at 10/10/20 0931  . morphine 2 MG/ML injection 2 mg  2 mg Intravenous Q4H PRN Agbata, Tochukwu, MD   2 mg at 10/09/20 1844  . ondansetron (ZOFRAN) tablet 4 mg  4 mg Oral Q6H PRN Agbata, Tochukwu, MD       Or  . ondansetron (ZOFRAN) injection 4 mg  4 mg Intravenous Q6H PRN Agbata, Tochukwu, MD      . PARoxetine (PAXIL) tablet 10 mg  10 mg Oral Daily Agbata, Tochukwu, MD   10 mg at 10/10/20 0931  . polyethylene glycol (MIRALAX / GLYCOLAX) packet 17 g  17 g Oral Daily Agbata, Tochukwu, MD   17 g at 10/10/20 0931  . rosuvastatin (CRESTOR) tablet 10 mg  10 mg Oral Daily Agbata, Tochukwu, MD   10 mg at 10/10/20 0931  . senna (SENOKOT) tablet 8.6 mg  1 tablet Oral Daily PRN Shuntell Foody, Kirt Boys, NP        VITAL SIGNS: BP 130/82 (BP Location: Left Arm)   Pulse 83   Temp 98.3 F (36.8 C) (Oral)   Resp  18   Ht 5\' 4"  (1.626 m)   Wt 165 lb (74.8 kg)   SpO2 97%   BMI 28.32 kg/m  Filed Weights   10/09/20 0754  Weight: 165 lb (74.8 kg)    Estimated body mass index is 28.32 kg/m as calculated from the following:   Height as of this encounter: 5\' 4"  (1.626 m).   Weight as of this encounter: 165 lb (74.8 kg).  LABS: CBC:    Component Value Date/Time   WBC 9.5 10/11/2020 0435   HGB 9.2 (L) 10/11/2020 0435   HGB 13.2 03/02/2014 1034   HCT 29.4 (L) 10/11/2020 0435   HCT 39.7 03/02/2014 1034   PLT 148 (L) 10/11/2020 0435   PLT 269 03/02/2014 1034   MCV 83.8 10/11/2020 0435   MCV 94 03/02/2014 1034   NEUTROABS 11.0 (H) 10/09/2020 0821   NEUTROABS 4.3 03/02/2014 1034   LYMPHSABS 1.0 10/09/2020 0821   LYMPHSABS  2.4 03/02/2014 1034   MONOABS 1.0 10/09/2020 0821   MONOABS 0.5 03/02/2014 1034   EOSABS 0.4 10/09/2020 0821   EOSABS 0.2 03/02/2014 1034   BASOSABS 0.0 10/09/2020 0821   BASOSABS 0.1 03/02/2014 1034   Comprehensive Metabolic Panel:    Component Value Date/Time   NA 138 10/11/2020 0435   NA 142 05/29/2020 1344   K 4.6 10/11/2020 0435   CL 109 10/11/2020 0435   CO2 21 (L) 10/11/2020 0435   BUN 28 (H) 10/11/2020 0435   BUN 9 05/29/2020 1344   CREATININE 0.67 10/11/2020 0435   CREATININE 0.63 08/08/2020 0000   GLUCOSE 121 (H) 10/11/2020 0435   GLUCOSE 134 (H) 03/01/2013 1414   CALCIUM 8.7 (L) 10/11/2020 0435   AST 25 10/09/2020 0821   AST 13 (L) 03/02/2014 1034   ALT 13 10/09/2020 0821   ALT 21 03/02/2014 1034   ALKPHOS 97 10/09/2020 0821   ALKPHOS 76 03/02/2014 1034   BILITOT 0.6 10/09/2020 0821   BILITOT 0.4 05/29/2020 1344   BILITOT 0.2 03/02/2014 1034   PROT 6.8 10/09/2020 0821   PROT 6.6 05/29/2020 1344   PROT 7.5 03/02/2014 1034   ALBUMIN 3.0 (L) 10/09/2020 0821   ALBUMIN 3.9 05/29/2020 1344   ALBUMIN 3.5 03/02/2014 1034    RADIOGRAPHIC STUDIES: DG Chest 2 View  Result Date: 10/04/2020 CLINICAL DATA:  Status post right chest wall mass biopsy  EXAM: CHEST - 2 VIEW COMPARISON:  Chest radiograph March 26, 2010 FINDINGS: The heart size and mediastinal contours are within normal limits. Calcifications aortic arch. No visible pneumothorax. Right lower lobe airspace opacity. Small right pleural effusion. No visible pneumothorax. Thoracic spondylosis. IMPRESSION: Right lower lobe atelectasis with right pleural effusion. No visible pneumothorax. Electronically Signed   By: Dahlia Bailiff MD   On: 10/04/2020 12:36   CT Head Wo Contrast  Result Date: 10/03/2020 CLINICAL DATA:  Headache, intracranial hemorrhage suspected EXAM: CT HEAD WITHOUT CONTRAST TECHNIQUE: Contiguous axial images were obtained from the base of the skull through the vertex without intravenous contrast. COMPARISON:  10/01/2020 FINDINGS: Brain: No evidence of acute infarction, hemorrhage, hydrocephalus, extra-axial collection or mass lesion/mass effect. Mild periventricular and deep white matter hypodensity. Vascular: No hyperdense vessel or unexpected calcification. Skull: Normal. Negative for fracture or focal lesion. Sinuses/Orbits: No acute finding. Other: None. IMPRESSION: 1. No acute intracranial pathology. No non-contrast CT findings to explain headache. Specifically, no evidence of intracranial hemorrhage. 2.  Small-vessel white matter disease. Electronically Signed   By: Eddie Candle M.D.   On: 10/03/2020 12:06   CT Head Wo Contrast  Result Date: 10/01/2020 CLINICAL DATA:  Altered mental status, unsteady gait EXAM: CT HEAD WITHOUT CONTRAST TECHNIQUE: Contiguous axial images were obtained from the base of the skull through the vertex without intravenous contrast. COMPARISON:  None. FINDINGS: Brain: No evidence of acute infarction, hemorrhage, hydrocephalus, extra-axial collection or mass lesion/mass effect. Scattered low-density changes within the periventricular and subcortical white matter compatible with chronic microvascular ischemic change. Mild diffuse cerebral volume loss.  Vascular: Atherosclerotic calcifications involving the large vessels of the skull base. No unexpected hyperdense vessel. Skull: Normal. Negative for fracture or focal lesion. Sinuses/Orbits: No acute finding. Other: None. IMPRESSION: 1. No acute intracranial findings. 2. Mild chronic microvascular ischemic change and cerebral volume loss. Electronically Signed   By: Davina Poke D.O.   On: 10/01/2020 16:28   CT Head W or Wo Contrast  Result Date: 10/09/2020 CLINICAL DATA:  Mental status change EXAM: CT HEAD WITHOUT AND  WITH CONTRAST TECHNIQUE: Contiguous axial images were obtained from the base of the skull through the vertex without and with intravenous contrast CONTRAST:  122mL OMNIPAQUE IOHEXOL 300 MG/ML  SOLN COMPARISON:  10/03/2020 FINDINGS: Brain: There is no acute intracranial hemorrhage, mass, mass effect, or edema. No abnormal enhancement. Gray-white differentiation is preserved. There is no extra-axial fluid collection. Ventricles and sulci are stable in size and configuration. Patchy hypoattenuation in the supratentorial white matter is nonspecific but likely reflects stable mild chronic microvascular ischemic changes. Vascular: There is atherosclerotic calcification at the skull base. Skull: Calvarium is unremarkable. Sinuses/Orbits: No acute finding. Other: None. IMPRESSION: No acute intracranial abnormality.  No abnormal enhancement. Electronically Signed   By: Macy Mis M.D.   On: 10/09/2020 09:55   CT ABDOMEN PELVIS W CONTRAST  Result Date: 10/09/2020 CLINICAL DATA:  Confusion, abdominal bloating, pain EXAM: CT ABDOMEN AND PELVIS WITH CONTRAST TECHNIQUE: Multidetector CT imaging of the abdomen and pelvis was performed using the standard protocol following bolus administration of intravenous contrast. CONTRAST:  134mL OMNIPAQUE IOHEXOL 300 MG/ML  SOLN COMPARISON:  10/06/2020 FINDINGS: Lower chest: Moderate right pleural effusion. Compressive atelectasis in the right lower lobe. Stable  mass in a right lower costochondral junction is unchanged. Postsurgical changes in the left breast. This is unchanged. Hepatobiliary: No change in the multiple masses throughout the liver presumably metastases. Dilated and thrombosed portal vein again noted with cavernous transformation, stable. Pancreas: Stable dilated pancreatic duct. Stable pancreatic body and pancreatic tail masses. Spleen: No focal abnormality.  Normal size. Adrenals/Urinary Tract: Stable low-density lesion off the upper pole of the left kidney measuring 1.8 cm. No hydronephrosis. Adrenal glands and urinary bladder unremarkable. Stomach/Bowel: Moderate stool throughout the colon. No evidence of bowel obstruction. Vascular/Lymphatic: Aortic atherosclerosis. Upper abdominal adenopathy again noted, the largest a portacaval node measuring up to 3.9 cm. Retroperitoneal adenopathy, stable. Reproductive: Prior hysterectomy Other: Large volume ascites in the abdomen and pelvis. Nodularity throughout the mesentery and omentum, most pronounced in the right abdomen and upper abdomen most likely reflects malignant ascites and peritoneal spread of disease. This may also be reflected within umbilical nodule, stable. Musculoskeletal: No acute bony abnormality or focal lesion. IMPRESSION: Extensive metastatic disease throughout the abdomen as described above. Findings stable since prior study. Chronic portal vein thrombosis with cavernous transformation, stable. Upper abdominal and retroperitoneal adenopathy, stable. Large volume ascites, likely malignant ascites, stable. No evidence of bowel obstruction. Stable moderate right pleural effusion with right lower lobe atelectasis. Electronically Signed   By: Rolm Baptise M.D.   On: 10/09/2020 10:15   CT ABDOMEN PELVIS W CONTRAST  Result Date: 10/06/2020 CLINICAL DATA:  Abdominal swelling, vomiting, history of left breast cancer, metastatic disease on previous CT EXAM: CT ABDOMEN AND PELVIS WITH CONTRAST  TECHNIQUE: Multidetector CT imaging of the abdomen and pelvis was performed using the standard protocol following bolus administration of intravenous contrast. CONTRAST:  160mL OMNIPAQUE IOHEXOL 300 MG/ML  SOLN COMPARISON:  10/03/2020 FINDINGS: Lower chest: Stable right pleural effusion and right lower lobe atelectasis. The soft tissue mass at the right anterior sixth costochondral junction is unchanged, please correlate with recent biopsy results. Chronic postsurgical changes left breast. Hepatobiliary: No change in the multiple confluent hypodense masses within the liver consistent with presumed metastases. Dilated thrombosed portal vein again noted. Pancreas: There is marked pancreatic parenchymal atrophy. There are multiple pancreatic masses identified. In the pancreatic tail hypodense mass measures approximately 2.4 x 1.8 cm image 24/2. Hypodense mass in the dorsal aspect of the pancreatic  body image 28/2 measures 2.2 x 1.9 cm, with hyperdense margins. Pancreatic duct dilation unchanged. Spleen: Spleen demonstrates normal size without focal abnormality. Splenic vein is patent. Adrenals/Urinary Tract: Indeterminate 1.8 cm hypodensity upper pole left kidney with Hounsfield attenuation of 43, stable. No other focal renal abnormalities. No renal tract calculi or obstructive uropathy. The bladder is decompressed with no focal abnormalities. The adrenals are normal. Stomach/Bowel: There is moderate gas and stool throughout the colon which may reflect an element of constipation. No evidence of bowel obstruction or ileus. Vascular/Lymphatic: Extensive atherosclerosis of the aorta is again noted unchanged. Chronic portal vein thrombosis with cavernous transformation again seen. The splenic vein and superior mesenteric vein are patent. Pathologic adenopathy is seen throughout the upper abdomen. 10 mm lymph node in the porta hepatis image 25/2 is stable. Multiple enlarged lymph nodes are seen within the gastrohepatic  ligament, largest measuring 11 mm in short axis image 23/2. Necrotic lymph node dorsal to the pancreatic body image 28/2 measures 19 mm in short axis. Aortocaval lymph node image 36/2 measures 10 mm in short axis. Reproductive: Status post hysterectomy. No adnexal masses. Other: Moderate abdominal and pelvic ascites, without appreciable change since prior study. There is some soft tissue nodularity within the right upper quadrant mesentery, just inferior to the liver and along the hepatic flexure of the colon, concerning for peritoneal implant. No free intraperitoneal gas. Stable umbilical hernia containing mesenteric fat. Inflammatory changes within the hernia may suggest incarceration. Musculoskeletal: No acute or destructive bony lesions. Reconstructed images demonstrate no additional findings. IMPRESSION: 1. Extensive intra-abdominal and intrapelvic metastases, without significant change since prior study. Given the above findings, this may reflect primary pancreatic adenocarcinoma with diffuse metastases. Correlation with recent biopsy results recommended. No significant change in the appearance of the metastatic disease since prior study. 2. Moderate ascites, without significant change since prior study. This is likely malignant ascites given the soft tissue nodularity within the right upper quadrant mesentery. 3. Chronic portal vein thrombosis with cavernous transformation. 4. No evidence of bowel obstruction or ileus. Retained gas and stool throughout the colon may reflect constipation. 5. Stable right pleural effusion. 6.  Aortic Atherosclerosis (ICD10-I70.0). Electronically Signed   By: Randa Ngo M.D.   On: 10/06/2020 19:51   CT ABDOMEN PELVIS W CONTRAST  Addendum Date: 10/03/2020   ADDENDUM REPORT: 10/03/2020 14:10 ADDENDUM: ,There is soft tissue fullness along the anterior right lower rib cartilage, including on 14/2. This is new since 02/19/2020, also highly suspicious for metastatic disease.  Electronically Signed   By: Abigail Miyamoto M.D.   On: 10/03/2020 14:10   Result Date: 10/03/2020 CLINICAL DATA:  Right-sided abdominal pain for about a week with nausea and vomiting x2. Constipation. Remote breast cancer history. EXAM: CT ABDOMEN AND PELVIS WITH CONTRAST TECHNIQUE: Multidetector CT imaging of the abdomen and pelvis was performed using the standard protocol following bolus administration of intravenous contrast. CONTRAST:  136mL OMNIPAQUE IOHEXOL 300 MG/ML  SOLN COMPARISON:  Multiple priors, including MRCP of 09/26/2020, abdominal CT of 02/19/2020, abdominal MRI 03/07/2020. FINDINGS: Lower chest: Right base atelectasis. Small right pleural effusion is new since the prior CT. Normal heart size with multivessel coronary artery atherosclerosis. Left-sided calcification or surgical clips. Deep to the presumed lumpectomy site is a centrally hypoattenuating lesion of 2.8 x 2.2 cm on 05/02. This is likely not imaged on the prior CT, but present on the 03/07/2020 MRI where it was grossly similar. Hepatobiliary: Multiple hepatic masses are again identified. A high right hepatic lobe  lesion measures 2.6 cm on 13/2 versus 1.8 cm on 02/19/2020. A wedge-shaped mass within segments 4 and 2 of the liver measures on the order of 10.5 x 5.1 cm on 19/2. Significantly enlarged from 02/19/2020, where lesions in this region measured up to 3.0 cm. Increased from on the order of 3.3 cm on 03/07/2020. Cholecystectomy without biliary duct dilatation. Pancreas: The pancreas demonstrates similar atrophy with moderate duct dilatation at 9 mm on 29/2. Duct dilatation is followed to the level of the pancreatic head, with there is mild soft tissue fullness but no well-defined mass. Example 34/2. This is unchanged. The pancreatic tail demonstrates developing relative soft tissue fullness compared to the pancreatic body. Example at on the order of 2.3 cm in thickness on 25/2 versus 1.4 cm on 02/19/2020. Spleen: Normal in size,  without focal abnormality. Adrenals/Urinary Tract: Normal adrenal glands. Upper pole left renal lesion of 1.6 cm is greater than fluid density but was determined a complex cyst on prior MRI. Normal right kidney. Contrast in the urinary bladder secondary to reported outside CT performed yesterday. Stomach/Bowel: Tiny hiatal hernia. Normal distal stomach. Moderate amount of stool within the rectum. Scattered colonic diverticula. Normal terminal ileum. Normal small bowel caliber. Vascular/Lymphatic: Aortic atherosclerosis. Diffuse portal vein thrombosis again identified. Thrombus involves the superior aspect of the superior mesenteric vein, chronic. New and progressive abdominal adenopathy. An aortocaval node measures 1.0 cm on 39/2 versus 7 mm on 02/19/2020 (when remeasured). A necrotic node along the celiac, just posterior to the pancreatic body measures 1.9 cm on 30/2 and is new. Developing adenopathy in the gastrohepatic ligament including at 1.0 cm on 24/2. More ill-defined hypoattenuating lesions in the peripancreatic space, including at 2.6 cm in the portacaval space on 29/2, increased from 2.3 cm on 02/19/2020. No pelvic sidewall adenopathy. Reproductive: Hysterectomy.  No adnexal mass. Other: Since 02/19/2020, development of small volume abdominopelvic ascites. Peritoneal nodularity, including within the pelvic cul-de-sac on 78/2 and along the right-side of the abdomen on 39/2. No free intraperitoneal air. A new periumbilical nodule or node measures 1.9 cm on 57/2. Musculoskeletal: No acute osseous abnormality. Trace L4-5 anterolisthesis. Degenerate disc disease at the lumbosacral junction. IMPRESSION: 1. When compared to multiple prior exams, including most direct comparison to the 02/19/2020 CT, new or progressive metastatic disease, most definite involving necrotic abdominal nodes and the peritoneum as detailed above. Developing periumbilical nodularity, also likely metastatic. 2. Progressive liver lesions,  also most likely indicative of metastatic disease. In the setting of portal vein thrombosis, progressive abscesses are possible but less likely. 3. Abnormal appearance of the pancreas, at least partially felt to be attributed to chronic pancreatitis. Developing soft tissue fullness within the pancreatic tail, with considerations of primary adenocarcinoma, metastatic disease, or superimposed acute pancreatitis. Peripancreatic hypoattenuating lesions, some of which are increased compared to 02/19/2020. These could be secondary to prior pancreatitis or also represent necrotic nodal metastasis. 4. Left breast mass in the setting of prior lumpectomy for carcinoma. Suspicious for new breast primary, incompletely imaged. 5.  Possible constipation. 6. New small volume abdominopelvic ascites. 7. Similar small right pleural effusion compared to the MRI of 09/26/2020. Electronically Signed: By: Abigail Miyamoto M.D. On: 10/03/2020 12:32   MR ABDOMEN MRCP WO CONTRAST  Result Date: 09/27/2020 CLINICAL DATA:  Pancreatitis. Pancreatic and liver lesions on prior MRI. Abdominal pain. EXAM: MRI ABDOMEN WITHOUT CONTRAST  (INCLUDING MRCP) TECHNIQUE: Multiplanar multisequence MR imaging of the abdomen was performed. Heavily T2-weighted images of the biliary and pancreatic ducts were obtained,  and three-dimensional MRCP images were rendered by post processing. COMPARISON:  03/07/2020 MRI FINDINGS: Despite efforts by the technologist and patient, severe motion artifact is present on today's exam and could not be eliminated. This reduces exam sensitivity and specificity. Due to pain, the patient terminated the exam prior to obtaining full diagnostic sequences. Lower chest: Small right pleural effusion is new compared to prior MRI. Hepatobiliary: There is been evolution of the previous regions of stippled T2 signal hyperintensity scattered in the liver to current appearance of hazy accentuated T2 signal for example on image 9 of series 4. On  biopsy, these were felt to represent areas of periductal fibrosis, chronic lymphoplasmacytic infiltrate, hemosiderin deposition, and regenerative hyperplasia suggesting finding secondary to portal vein thrombosis. This process is significantly expanded throughout segment 4a and 4B of the liver which not demonstrates diffuse hazy accentuated T2 signal as shown on image 14 series 4. There is associated reduced T1 signal in these regions of involvement. No steatosis. There is some associated restriction of diffusion in these regions of involvement. Unfortunately the MRCP images are severely blurred. No obvious extrahepatic biliary dilatation. There is some mild narrowing of the common hepatic duct along the porta hepatis for example on image 16 series 3, probably due to surrounding soft tissue swelling and possibly periportal fibrosis. As before, there is high T2 signal intensity material throughout the somewhat dilated intrahepatic portal venous system probably from thrombosis. Periportal edema noted. Pancreas: Peripancreatic edema noted with stippled accentuated T2 signal in the pancreas, and substantially dilated segments of the dorsal pancreatic duct especially in the pancreatic body, up to 1.0 cm in diameter. T2 signal hyperintensity partially involving the pancreatic head measuring about 4.5 by 1.6 by 2.6 cm, extending cephalad along the porta hepatis, I am uncertain whether this represents a thrombosed varix from the portal vein, pseudocyst, or a cystic pancreatic neoplasm. This appears roughly similar to the prior exam. Spleen:  Unremarkable Adrenals/Urinary Tract: Small renal lesions of varying complexity are present and probably a combination of simple and complex cysts, although enhancement characteristics are not interrogated today. The adrenal glands appear normal. Stomach/Bowel: Unremarkable Vascular/Lymphatic: Aortoiliac atherosclerotic vascular disease. 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 Collaterals along the porta hepatis. Other: Ascites particularly along the paracolic gutters and in the pelvis (pelvic ascites seen on coronal images). Mesenteric edema mildly improved in the central mesentery compared to previous. Omental edema noted. There is hazy stranding in the root of the mesentery as on prior exams. Musculoskeletal: Lower lumbar spondylosis and degenerative disc disease. IMPRESSION: 1. Despite efforts by the technologist and patient, severe motion artifact is present on today's exam and could not be eliminated. The patient was also unable to complete the full sequence of the exam. This reduces exam sensitivity and specificity. 2. Evolution of the appearance in the liver with the previously stippled T2 signal hyperintensities now appearing as hazy increased T2 signal, and with substantially increased involvement throughout segment 4 and 4B of the liver. Based on prior biopsy results this likely represents combination of fibrosis, lymphoplasmacytic infiltrate, hemosiderin deposition, and periportal hyperplasia resulting from portal vein thrombosis. 3. 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 No obvious extrahepatic biliary dilatation. There is some mild narrowing of the common hepatic duct along the porta hepatis, probably due to surrounding soft tissue swelling  and possibly periportal fibrosis. 4. Peripancreatic edema with stippled accentuated T2 signal in the pancreas, and substantially dilated segments of the dorsal pancreatic duct especially in the pancreatic body. 5. Lobulated T2 signal hyperintensity along the pancreatic head, possibly a pseudocyst or a thrombosed varix of the portal vein, less likely cystic pancreatic neoplasm. 6. Small right pleural effusion is  new compared to previous MRI. 7. Reduced central mesenteric edema compared to previous, although there is still some peripancreatic edema as well as mesenteric edema and ascites. 8. Lower lumbar spondylosis and degenerative disc disease. Electronically Signed   By: Van Clines M.D.   On: 09/27/2020 16:11   US Paracentesis  Result Date: 10/09/2020 INDICATION: 75 year old female with history of breast cancer and abdominal pain found to have large volume ascites. EXAM: ULTRASOUND GUIDED left lower quadrant PARACENTESIS MEDICATIONS: None. COMPLICATIONS: None immediate. PROCEDURE: Informed written consent was obtained from the patient after a discussion of the risks, benefits and alternatives to treatment. A timeout was performed prior to the initiation of the procedure. Initial ultrasound scanning demonstrates a large amount of ascites within the left lower abdominal quadrant. The right lower abdomen was prepped and draped in the usual sterile fashion. 1% lidocaine was used for local anesthesia. Following this, a 6 Fr Safe-T-Centesis catheter was introduced. An ultrasound image was saved for documentation purposes. The paracentesis was performed. The catheter was removed and a dressing was applied. The patient tolerated the procedure well without immediate post procedural complication. FINDINGS: A total of approximately 3.05 L of translucent, straw-colored fluid was removed. Samples were sent to the laboratory as requested by the clinical team. IMPRESSION: Successful ultrasound-guided paracentesis yielding 3.05 liters of peritoneal fluid. Ruthann Cancer, MD [facility] Electronically Signed   By: Ruthann Cancer MD   On: 10/09/2020 15:53   DG Chest Portable 1 View  Result Date: 10/09/2020 CLINICAL DATA:  Altered mental status.  Rule out pneumonia EXAM: PORTABLE CHEST 1 VIEW COMPARISON:  10/04/2020 FINDINGS: Progression of right lower lobe airspace  disease.  No effusion Left lung remains clear.  Negative for heart failure. IMPRESSION: Progression of right lower lobe airspace disease which may represent atelectasis or pneumonia. Electronically Signed   By: Franchot Gallo M.D.   On: 10/09/2020 10:11   [place] CORE BIOPSY (SOFT TISSUE)  Result Date: 10/04/2020 INDICATION: 75 year old woman with history of breast cancer presented to interventional radiology for biopsy of new right chest wall mass. EXAM: Ultrasound-guided biopsy of right chest wall mass MEDICATIONS: None. ANESTHESIA/SEDATION: Moderate (conscious) sedation was employed during this procedure. A total of Versed 2 mg and Fentanyl 100 mcg was administered intravenously. Moderate Sedation Time: 8 minutes. The patient's level of consciousness and vital signs were monitored continuously by radiology nursing throughout the procedure under my direct supervision. COMPLICATIONS: None immediate. PROCEDURE: Informed written consent was obtained from the patient after a thorough discussion of the procedural risks, benefits and alternatives. All questions were addressed. Maximal Sterile Barrier Technique was utilized including caps, mask, sterile gowns, sterile gloves, sterile drape, hand hygiene and skin antiseptic. A timeout was performed prior to the initiation of the procedure. Patient position supine on the ultrasound table. Right anterior lower chest wall skin prepped and draped in usual sterile fashion. Following local lidocaine administration, 18 gauge biopsy needle was advanced into the right anterior chest wall mass, and four cores were obtained utilizing continuous ultrasound guidance. Samples were sent to pathology in formalin. Needle removed and hemostasis achieved with 5 minutes of  manual compression. Post procedure ultrasound images showed no evidence of significant hemorrhage. IMPRESSION: Ultrasound-guided biopsy of right anterior chest wall mass. Electronically Signed   By: Miachel Roux M.D.   On:  10/04/2020 17:04    PERFORMANCE STATUS (ECOG) : 2 - Symptomatic, <50% confined to bed  Review of Systems Unless otherwise noted, a complete review of systems is negative.  Physical Exam General: NAD Pulmonary: unlabored Extremities: no edema, no joint deformities Skin: no rashes Neurological: Weakness but otherwise nonfocal  IMPRESSION: Patient received chemotherapy yesterday.  Seems to be doing reasonably well today.  Asymptomatic at present.  She does not have much of an appetite and some picking at food.  Feels weak.  Daughter is at bedside.  Patient's not had a bowel movement in several days.  We will add daily senna with her MiraLAX.  Oral intake is poor.  We will add oral nutritional supplements twice daily.  She will benefit from being followed by nutritionist.  We will consult PT for eval.  May benefit from home health support at time of discharge.  PLAN: -Continue current scope of treatment -May benefit from home health PT at time of discharge -Inpatient PT eval -Referral for [facility] nutrition -Oral nutritional supplements twice daily -Add senna with daily MiraLAX -Portable DNR signed and placed on chart -Will plan to follow patient in the [facility] after discharge  Case discussed with Dr. Dwyane Dee  Time Total: 25 minutes  Visit consisted of counseling and education dealing with the complex and emotionally intense issues of symptom management and palliative care in the setting of serious and potentially life-threatening illness.Greater than 50%  of this time was spent counseling and coordinating care related to the above assessment and plan.  Signed by: Altha Harm, PhD, NP-C

## 2020-10-12 DIAGNOSIS — R1011 Right upper quadrant pain: Secondary | ICD-10-CM | POA: Diagnosis not present

## 2020-10-12 DIAGNOSIS — G9341 Metabolic encephalopathy: Secondary | ICD-10-CM | POA: Diagnosis not present

## 2020-10-12 DIAGNOSIS — R180 Malignant ascites: Secondary | ICD-10-CM | POA: Diagnosis not present

## 2020-10-12 DIAGNOSIS — I81 Portal vein thrombosis: Secondary | ICD-10-CM | POA: Diagnosis not present

## 2020-10-12 LAB — CBC
HCT: 30.2 % — ABNORMAL LOW (ref 36.0–46.0)
Hemoglobin: 9.6 g/dL — ABNORMAL LOW (ref 12.0–15.0)
MCH: 26.4 pg (ref 26.0–34.0)
MCHC: 31.8 g/dL (ref 30.0–36.0)
MCV: 83.2 fL (ref 80.0–100.0)
Platelets: 128 10*3/uL — ABNORMAL LOW (ref 150–400)
RBC: 3.63 MIL/uL — ABNORMAL LOW (ref 3.87–5.11)
RDW: 17 % — ABNORMAL HIGH (ref 11.5–15.5)
WBC: 17.7 10*3/uL — ABNORMAL HIGH (ref 4.0–10.5)
nRBC: 0.3 % — ABNORMAL HIGH (ref 0.0–0.2)

## 2020-10-12 LAB — BASIC METABOLIC PANEL
Anion gap: 4 — ABNORMAL LOW (ref 5–15)
BUN: 32 mg/dL — ABNORMAL HIGH (ref 8–23)
CO2: 23 mmol/L (ref 22–32)
Calcium: 8.6 mg/dL — ABNORMAL LOW (ref 8.9–10.3)
Chloride: 110 mmol/L (ref 98–111)
Creatinine, Ser: 0.64 mg/dL (ref 0.44–1.00)
GFR, Estimated: >60 mL/min (ref >60)
Glucose, Bld: 109 mg/dL — ABNORMAL HIGH (ref 70–99)
Potassium: 4.2 mmol/L (ref 3.5–5.1)
Sodium: 137 mmol/L (ref 135–145)

## 2020-10-12 LAB — LIPASE, FLUID: Lipase-Fluid: 6 U/L

## 2020-10-12 LAB — PHOSPHORUS: Phosphorus: 2.5 mg/dL (ref 2.5–4.6)

## 2020-10-12 LAB — MAGNESIUM: Magnesium: 2.2 mg/dL (ref 1.7–2.4)

## 2020-10-12 MED ORDER — ENSURE ENLIVE PO LIQD: 237.0000 mL | Freq: Two times a day (BID) | ORAL | 12 refills | Status: AC

## 2020-10-12 NOTE — Evaluation (Signed)
Physical Therapy Evaluation Patient Details Name: Theresa Andrews MRN: [id number] DOB: 01-26-1946 Today's Date: 10/12/2020   History of Present Illness  Patient is a 75 year old African-American female who presents to the ER with her daughter for evaluation of persistent abdominal pain mostly in the right upper quadrant.  She was recently hospitalized for same and was discharged home with a diagnosis of constipation.  Patient states that she has been compliant with stool softeners without any improvement in her symptoms.  Patient had a biopsy of the right chest wall mass which is consistent with metastatic adenocarcinoma of the pancreas.  She also has massive ascites and with diffuse abdominal pain there is a concern for possible SBP.  She will be admitted for further evaluation and treatment.  Oncology was consulted in the ER. 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  Daughter who lives next door and is a caregiver was present, and reported her family will assist to stay with pt and care for her.  Pt is light headed upon standing after having BP of 128/115 sitting, dropped to 105/75.  Discussed the relative declined of the values with them and have now reported to MD.  Daughter has been walking pt in the room, but reports she is requiring a standing rest.  Will progress with her to walk and stairs when able unless pt is leaving today.  Follow for acute PT goals, with concentration on standing stability and safety with all gait situations.    Follow Up Recommendations Home health PT;Supervision for mobility/OOB;Supervision/Assistance - 24 hour    Equipment Recommendations  None recommended by PT    Recommendations for Other Services       Precautions /  Restrictions Precautions Precautions: Fall Precaution Comments: monitor BP Restrictions Weight Bearing Restrictions: No Other Position/Activity Restrictions: ck BP before walking      Mobility  Bed Mobility Overal bed mobility: Needs Assistance Bed Mobility: Supine to Sit;Sit to Supine     Supine to sit: Min assist Sit to supine: Min assist   General bed mobility comments: min assist to sit up and scoot to EOB    Transfers Overall transfer level: Needs assistance Equipment used: Rolling walker (2 wheeled);1 person hand held assist Transfers: Sit to/from Stand Sit to Stand: Min assist         General transfer comment: min assist to power up and then to steady min guard  Ambulation/Gait         Gait velocity: reduced   General Gait Details: did not take steps as pt did not feel well, but has been walking with daughter in room prior to this  Stairs            Wheelchair Mobility    Modified Rankin (Stroke Patients Only)       Balance Overall balance assessment: Needs assistance Sitting-balance support: Feet supported Sitting balance-Leahy Scale: Fair     Standing balance support: Bilateral upper extremity supported;During functional activity Standing balance-Leahy Scale: Poor                               Pertinent Vitals/Pain Pain Assessment: Faces Faces Pain Scale: Hurts even more Pain Location: abdomen in standing Pain Descriptors / Indicators: Aching Pain Intervention(s): Monitored during  session;Repositioned    Home Living Family/patient expects to be discharged to:: Private residence Living Arrangements: Alone Available Help at Discharge: Family;Available PRN/intermittently Type of Home: House Home Access: Stairs to enter Entrance Stairs-Rails: Can reach both Entrance Stairs-Number of Steps: 5 Home Layout: One level Home Equipment: Walker - 4 wheels;Shower seat Additional Comments: family intends to have caregivers when  they are not there    Prior Function Level of Independence: Independent with assistive device(s)         Comments: previously was walking alone on Roll[place]     Hand Dominance   Dominant Hand: Right    Extremity/Trunk Assessment   Upper Extremity Assessment Upper Extremity Assessment: Generalized weakness    Lower Extremity Assessment Lower Extremity Assessment: Generalized weakness    Cervical / Trunk Assessment Cervical / Trunk Assessment: Normal  Communication   Communication: No difficulties  Cognition Arousal/Alertness: Awake/alert Behavior During Therapy: Flat affect Overall Cognitive Status: Impaired/Different from baseline Area of Impairment: Problem solving;Awareness;Safety/judgement;Following commands;Orientation                 Orientation Level: Situation     Following Commands: Follows one step commands inconsistently;Follows one step commands with increased time Safety/Judgement: Decreased awareness of deficits Awareness: Intellectual Problem Solving: Slow processing;Requires verbal cues;Requires tactile cues General Comments: up on side of bed and noted BP drop from sitting to standing, while not hypotensive was relatively low to sitting      General Comments General comments (skin integrity, edema, etc.): Pt was checked for BP in sitting and standing with relative drop from 128/115 to 105/75    Exercises     Assessment/Plan    PT Assessment Patient needs continued PT services  PT Problem List Decreased strength;Decreased range of motion;Decreased activity tolerance;Decreased balance;Decreased mobility;Decreased cognition;Decreased knowledge of use of DME;Decreased safety awareness;Cardiopulmonary status limiting activity;Pain       PT Treatment Interventions DME instruction;Gait training;Stair training;Functional mobility training;Therapeutic activities;Therapeutic exercise;Balance training;Neuromuscular re-education;Patient/family  education    PT Goals (Current goals can be found in the Care Plan section)  Acute Rehab PT Goals Patient Stated Goal: none stated PT Goal Formulation: With family Time For Goal Achievement: 10/25/20 Potential to Achieve Goals: Good    Frequency Min 2X/week   Barriers to discharge Inaccessible home environment;Decreased caregiver support home alone with 5 steps to enter    Co-evaluation               AM-PAC PT "6 Clicks" Mobility  Outcome Measure Help needed turning from your back to your side while in a flat bed without using bedrails?: A Little Help needed moving from lying on your back to sitting on the side of a flat bed without using bedrails?: A Little Help needed moving to and from a bed to a chair (including a wheelchair)?: A Little Help needed standing up from a chair using your arms (e.g., wheelchair or bedside chair)?: A Little Help needed to walk in hospital room?: A Little Help needed climbing 3-5 steps with a railing? : A Little 6 Click Score: 18    End of Session Equipment Utilized During Treatment: Gait belt Activity Tolerance: Patient limited by fatigue;Treatment limited secondary to medical complications (Comment) Patient left: in bed;with call bell/phone within reach;with bed alarm set;with family/visitor present Nurse Communication: Mobility status;Other (comment) (update on BP) PT Visit Diagnosis: Unsteadiness on feet (R26.81);Muscle weakness (generalized) (M62.81);Difficulty in walking, not elsewhere classified (R26.2);Dizziness and giddiness (R42)    Time: 5638-7564 PT Time Calculation (min) (ACUTE ONLY): 20  min   Charges:   PT Evaluation $PT Eval Moderate Complexity: 1 Mod         Ramond Dial 10/12/2020, 3:02 PM  Mee Hives, PT MS Acute Rehab Dept. Number: [place] [facility] [place]

## 2020-10-12 NOTE — Discharge Summary (Signed)
Physician Discharge Summary  Theresa Andrews:[id number] DOB: [birth year] DOA: 10/09/2020  PCP: Steele Sizer, MD  Admit date: 10/09/2020 Discharge date: 10/12/2020  Admitted From: home Disposition:  home  Recommendations for Outpatient Follow-up:  1. Follow up with PCP in 1-2 weeks 2. Please obtain BMP/CBC in one week 3. Please follow up with oncology and palliative care at the [place]: PT, OT, RN  Equipment/Devices: none   Discharge Condition: stable CODE STATUS: DNR  Diet recommendation: Heart Healthy   Discharge Diagnoses: Principal Problem:   Abdominal pain Active Problems:   Anxiety disorder   Benign essential HTN   Portal vein thrombosis   Malignant ascites   Carcinoma of unknown primary [facility])   Palliative care encounter   Acute metabolic encephalopathy    Summary of HPI and Hospital Course:   Theresa Lampert Jonesis a 75 y.o.femalewith medical history significant forleft-sided breast cancer status post lumpectomy, radiation and chemotherapy, history of hyperparathyroidism, hypertension, anxiety disorder, hypocalcemia and dyslipidemia who presents to the ER for evaluation of persistent abdominal pain as well as mental status changes.   ED workup included CT scan of abdomen and pelvis which showedextensive metastatic disease throughout the abdomen.Marland KitchenMarland KitchenFindings stable since prior study.   Chronic portal vein thrombosis with cavernous transformation, stable.  Upper abdominal and retroperitoneal adenopathy, stable. Large volume ascites, likely malignant ascites, stable. Noevidence of bowel obstruction.  Stable moderate right pleural effusion with right lower lobe atelectasis. Chest x-ray with progression of right lower lobe airspace disease which may represent atelectasis or pneumonia. CT scan of the head without contrast showsno acute intracranial abnormality. No abnormal enhancement.   Metabolic encephalopathy, patient is having episodes of  off-and-on confusion for the past 3 weeks as per family Patient is still mildly confused, not back to her baseline CT scan negative for brain metastasis Continue supportive care and redirection   Abdominal pain most likely secondary to extensive metastatic disease noted throughout the abdomen.   Peritoneal fluid negative for SBP Patient had a recent biopsy done of the right side chest wall mass consistent with adenocarcinoma of the pancreas She also has large volume ascites most likely metastatic S/p paracentesis 3 L fluid was tapped, cell count last, ruled out SBP,  Ascites fluid culture negative to date, cytology pending Pain control as needed. Treated with empiric Rocephin ceftriaxone, stopped given negative cultures.   Newly diagnosed metastatic adenocarcinoma of the pancreas Consult oncology Carcinoma of unknown primary-stage IV-possible pancreatic versus left breast/recurrence-currently awaiting ER/PR HER-2/neu status on the chest wall soft tissue specimen. However given the bulky disease symptomatic disease-proceed with chemotherapy carbotaxol. Patient understands that treatments are palliative not curative. Patient was cleared for discharge and follow-up on 3/21 at [place]  Hypertension Continue ibesartan, metoprolol and amlodipine   Depression/anxiety Continue Paxil   History of portal vein thrombosis Continue therapeutic dose Lovenox q12hr PTA dose   Goals of care Poor prognosis, palliative care consulted for goals of care discussion.  CODE STATUS was changed to DNR.  Palliative to follow after discharge at the [facility].   Discharge Instructions   Discharge Instructions    Ambulatory Referral to [facility] Nutrition   Complete by: As directed    Call MD for:  extreme fatigue   Complete by: As directed    Call MD for:  persistant dizziness or light-headedness   Complete by: As directed    Call MD for:  persistant nausea and vomiting   Complete  by: As directed  Call MD for:  severe uncontrolled pain   Complete by: As directed    Call MD for:  temperature >100.4   Complete by: As directed    Diet - low sodium heart healthy   Complete by: As directed    Increase activity slowly   Complete by: As directed    No wound care   Complete by: As directed      Allergies as of 10/12/2020      Reactions   Tetanus Toxoids Shortness Of Breath, Rash   Shellfish Allergy       Medication List    STOP taking these medications   cephALEXin 500 MG capsule Commonly known as: KEFLEX   tretinoin 0.025 % cream Commonly known as: RETIN-A     TAKE these medications   amLODipine 10 MG tablet Commonly known as: NORVASC Take 1 tablet (10 mg total) by mouth daily.   cholecalciferol 1000 units tablet Commonly known as: VITAMIN D Take 1,000 Units by mouth daily.   enoxaparin 80 MG/0.8ML injection Commonly known as: LOVENOX Inject into the skin. What changed: Another medication with the same name was removed. Continue taking this medication, and follow the directions you see here.   feeding supplement Liqd Take 237 mLs by mouth 2 (two) times daily between meals. Start taking on: October 13, 2020   Lactulose 20 GM/30ML Soln Take 30 mLs (20 g total) by mouth daily as needed.   metoprolol succinate 25 MG 24 hr tablet Commonly known as: TOPROL-XL Take 0.5 tablets (12.5 mg total) by mouth daily.   ondansetron 4 MG tablet Commonly known as: Zofran Take 1 tablet (4 mg total) by mouth daily as needed for nausea or vomiting.   oxyCODONE-acetaminophen 10-325 MG tablet Commonly known as: Percocet Take 1 tablet by mouth every 6 (six) hours as needed for pain.   PARoxetine 10 MG tablet Commonly known as: PAXIL Take 1 tablet (10 mg total) by mouth daily.   polyethylene glycol 17 g packet Commonly known as: MIRALAX / GLYCOLAX Take 17 g by mouth 2 (two) times daily as needed.   potassium chloride SA 20 MEQ tablet Commonly known as:  KLOR-CON 1 pill twice a day   rosuvastatin 10 MG tablet Commonly known as: CRESTOR Take 1 tablet (10 mg total) by mouth daily.   telmisartan 80 MG tablet Commonly known as: MICARDIS One daily       Allergies  Allergen Reactions  . Tetanus Toxoids Shortness Of Breath and Rash  . Shellfish Allergy      If you experience worsening of your admission symptoms, develop shortness of breath, life threatening emergency, suicidal or homicidal thoughts you must seek medical attention immediately by calling 911 or calling your MD immediately  if symptoms less severe.    Please note   You were cared for by a hospitalist during your hospital stay. If you have any questions about your discharge medications or the care you received while you were in the hospital after you are discharged, you can call the unit and asked to speak with the hospitalist on call if the hospitalist that took care of you is not available. Once you are discharged, your primary care physician will handle any further medical issues. Please note that NO REFILLS for any discharge medications will be authorized once you are discharged, as it is imperative that you return to your primary care physician (or establish a relationship with a primary care physician if you do not have one) for your  aftercare needs so that they can reassess your need for medications and monitor your lab values.   Consultations:  Palliative Care   Procedures/Studies: DG Chest 2 View  Result Date: 10/04/2020 CLINICAL DATA:  Status post right chest wall mass biopsy EXAM: CHEST - 2 VIEW COMPARISON:  Chest radiograph March 26, 2010 FINDINGS: The heart size and mediastinal contours are within normal limits. Calcifications aortic arch. No visible pneumothorax. Right lower lobe airspace opacity. Small right pleural effusion. No visible pneumothorax. Thoracic spondylosis. IMPRESSION: Right lower lobe atelectasis with right pleural effusion. No visible  pneumothorax. Electronically Signed   By: Dahlia Bailiff MD   On: 10/04/2020 12:36   CT Head Wo Contrast  Result Date: 10/03/2020 CLINICAL DATA:  Headache, intracranial hemorrhage suspected EXAM: CT HEAD WITHOUT CONTRAST TECHNIQUE: Contiguous axial images were obtained from the base of the skull through the vertex without intravenous contrast. COMPARISON:  10/01/2020 FINDINGS: Brain: No evidence of acute infarction, hemorrhage, hydrocephalus, extra-axial collection or mass lesion/mass effect. Mild periventricular and deep white matter hypodensity. Vascular: No hyperdense vessel or unexpected calcification. Skull: Normal. Negative for fracture or focal lesion. Sinuses/Orbits: No acute finding. Other: None. IMPRESSION: 1. No acute intracranial pathology. No non-contrast CT findings to explain headache. Specifically, no evidence of intracranial hemorrhage. 2.  Small-vessel white matter disease. Electronically Signed   By: Eddie Candle M.D.   On: 10/03/2020 12:06   CT Head Wo Contrast  Result Date: 10/01/2020 CLINICAL DATA:  Altered mental status, unsteady gait EXAM: CT HEAD WITHOUT CONTRAST TECHNIQUE: Contiguous axial images were obtained from the base of the skull through the vertex without intravenous contrast. COMPARISON:  None. FINDINGS: Brain: No evidence of acute infarction, hemorrhage, hydrocephalus, extra-axial collection or mass lesion/mass effect. Scattered low-density changes within the periventricular and subcortical white matter compatible with chronic microvascular ischemic change. Mild diffuse cerebral volume loss. Vascular: Atherosclerotic calcifications involving the large vessels of the skull base. No unexpected hyperdense vessel. Skull: Normal. Negative for fracture or focal lesion. Sinuses/Orbits: No acute finding. Other: None. IMPRESSION: 1. No acute intracranial findings. 2. Mild chronic microvascular ischemic change and cerebral volume loss. Electronically Signed   By: Davina Poke  D.O.   On: 10/01/2020 16:28   CT Head W or Wo Contrast  Result Date: 10/09/2020 CLINICAL DATA:  Mental status change EXAM: CT HEAD WITHOUT AND WITH CONTRAST TECHNIQUE: Contiguous axial images were obtained from the base of the skull through the vertex without and with intravenous contrast CONTRAST:  168mL OMNIPAQUE IOHEXOL 300 MG/ML  SOLN COMPARISON:  10/03/2020 FINDINGS: Brain: There is no acute intracranial hemorrhage, mass, mass effect, or edema. No abnormal enhancement. Gray-white differentiation is preserved. There is no extra-axial fluid collection. Ventricles and sulci are stable in size and configuration. Patchy hypoattenuation in the supratentorial white matter is nonspecific but likely reflects stable mild chronic microvascular ischemic changes. Vascular: There is atherosclerotic calcification at the skull base. Skull: Calvarium is unremarkable. Sinuses/Orbits: No acute finding. Other: None. IMPRESSION: No acute intracranial abnormality.  No abnormal enhancement. Electronically Signed   By: Macy Mis M.D.   On: 10/09/2020 09:55   CT ABDOMEN PELVIS W CONTRAST  Result Date: 10/09/2020 CLINICAL DATA:  Confusion, abdominal bloating, pain EXAM: CT ABDOMEN AND PELVIS WITH CONTRAST TECHNIQUE: Multidetector CT imaging of the abdomen and pelvis was performed using the standard protocol following bolus administration of intravenous contrast. CONTRAST:  148mL OMNIPAQUE IOHEXOL 300 MG/ML  SOLN COMPARISON:  10/06/2020 FINDINGS: Lower chest: Moderate right pleural effusion. Compressive atelectasis in the right  lower lobe. Stable mass in a right lower costochondral junction is unchanged. Postsurgical changes in the left breast. This is unchanged. Hepatobiliary: No change in the multiple masses throughout the liver presumably metastases. Dilated and thrombosed portal vein again noted with cavernous transformation, stable. Pancreas: Stable dilated pancreatic duct. Stable pancreatic body and pancreatic tail  masses. Spleen: No focal abnormality.  Normal size. Adrenals/Urinary Tract: Stable low-density lesion off the upper pole of the left kidney measuring 1.8 cm. No hydronephrosis. Adrenal glands and urinary bladder unremarkable. Stomach/Bowel: Moderate stool throughout the colon. No evidence of bowel obstruction. Vascular/Lymphatic: Aortic atherosclerosis. Upper abdominal adenopathy again noted, the largest a portacaval node measuring up to 3.9 cm. Retroperitoneal adenopathy, stable. Reproductive: Prior hysterectomy Other: Large volume ascites in the abdomen and pelvis. Nodularity throughout the mesentery and omentum, most pronounced in the right abdomen and upper abdomen most likely reflects malignant ascites and peritoneal spread of disease. This may also be reflected within umbilical nodule, stable. Musculoskeletal: No acute bony abnormality or focal lesion. IMPRESSION: Extensive metastatic disease throughout the abdomen as described above. Findings stable since prior study. Chronic portal vein thrombosis with cavernous transformation, stable. Upper abdominal and retroperitoneal adenopathy, stable. Large volume ascites, likely malignant ascites, stable. No evidence of bowel obstruction. Stable moderate right pleural effusion with right lower lobe atelectasis. Electronically Signed   By: Rolm Baptise M.D.   On: 10/09/2020 10:15   CT ABDOMEN PELVIS W CONTRAST  Result Date: 10/06/2020 CLINICAL DATA:  Abdominal swelling, vomiting, history of left breast cancer, metastatic disease on previous CT EXAM: CT ABDOMEN AND PELVIS WITH CONTRAST TECHNIQUE: Multidetector CT imaging of the abdomen and pelvis was performed using the standard protocol following bolus administration of intravenous contrast. CONTRAST:  148mL OMNIPAQUE IOHEXOL 300 MG/ML  SOLN COMPARISON:  10/03/2020 FINDINGS: Lower chest: Stable right pleural effusion and right lower lobe atelectasis. The soft tissue mass at the right anterior sixth costochondral  junction is unchanged, please correlate with recent biopsy results. Chronic postsurgical changes left breast. Hepatobiliary: No change in the multiple confluent hypodense masses within the liver consistent with presumed metastases. Dilated thrombosed portal vein again noted. Pancreas: There is marked pancreatic parenchymal atrophy. There are multiple pancreatic masses identified. In the pancreatic tail hypodense mass measures approximately 2.4 x 1.8 cm image 24/2. Hypodense mass in the dorsal aspect of the pancreatic body image 28/2 measures 2.2 x 1.9 cm, with hyperdense margins. Pancreatic duct dilation unchanged. Spleen: Spleen demonstrates normal size without focal abnormality. Splenic vein is patent. Adrenals/Urinary Tract: Indeterminate 1.8 cm hypodensity upper pole left kidney with Hounsfield attenuation of 43, stable. No other focal renal abnormalities. No renal tract calculi or obstructive uropathy. The bladder is decompressed with no focal abnormalities. The adrenals are normal. Stomach/Bowel: There is moderate gas and stool throughout the colon which may reflect an element of constipation. No evidence of bowel obstruction or ileus. Vascular/Lymphatic: Extensive atherosclerosis of the aorta is again noted unchanged. Chronic portal vein thrombosis with cavernous transformation again seen. The splenic vein and superior mesenteric vein are patent. Pathologic adenopathy is seen throughout the upper abdomen. 10 mm lymph node in the porta hepatis image 25/2 is stable. Multiple enlarged lymph nodes are seen within the gastrohepatic ligament, largest measuring 11 mm in short axis image 23/2. Necrotic lymph node dorsal to the pancreatic body image 28/2 measures 19 mm in short axis. Aortocaval lymph node image 36/2 measures 10 mm in short axis. Reproductive: Status post hysterectomy. No adnexal masses. Other: Moderate abdominal and pelvic ascites, without  appreciable change since prior study. There is some soft  tissue nodularity within the right upper quadrant mesentery, just inferior to the liver and along the hepatic flexure of the colon, concerning for peritoneal implant. No free intraperitoneal gas. Stable umbilical hernia containing mesenteric fat. Inflammatory changes within the hernia may suggest incarceration. Musculoskeletal: No acute or destructive bony lesions. Reconstructed images demonstrate no additional findings. IMPRESSION: 1. Extensive intra-abdominal and intrapelvic metastases, without significant change since prior study. Given the above findings, this may reflect primary pancreatic adenocarcinoma with diffuse metastases. Correlation with recent biopsy results recommended. No significant change in the appearance of the metastatic disease since prior study. 2. Moderate ascites, without significant change since prior study. This is likely malignant ascites given the soft tissue nodularity within the right upper quadrant mesentery. 3. Chronic portal vein thrombosis with cavernous transformation. 4. No evidence of bowel obstruction or ileus. Retained gas and stool throughout the colon may reflect constipation. 5. Stable right pleural effusion. 6.  Aortic Atherosclerosis (ICD10-I70.0). Electronically Signed   By: Randa Ngo M.D.   On: 10/06/2020 19:51   CT ABDOMEN PELVIS W CONTRAST  Addendum Date: 10/03/2020   ADDENDUM REPORT: 10/03/2020 14:10 ADDENDUM: ,There is soft tissue fullness along the anterior right lower rib cartilage, including on 14/2. This is new since 02/19/2020, also highly suspicious for metastatic disease. Electronically Signed   By: Abigail Miyamoto M.D.   On: 10/03/2020 14:10   Result Date: 10/03/2020 CLINICAL DATA:  Right-sided abdominal pain for about a week with nausea and vomiting x2. Constipation. Remote breast cancer history. EXAM: CT ABDOMEN AND PELVIS WITH CONTRAST TECHNIQUE: Multidetector CT imaging of the abdomen and pelvis was performed using the standard protocol following  bolus administration of intravenous contrast. CONTRAST:  176mL OMNIPAQUE IOHEXOL 300 MG/ML  SOLN COMPARISON:  Multiple priors, including MRCP of 09/26/2020, abdominal CT of 02/19/2020, abdominal MRI 03/07/2020. FINDINGS: Lower chest: Right base atelectasis. Small right pleural effusion is new since the prior CT. Normal heart size with multivessel coronary artery atherosclerosis. Left-sided calcification or surgical clips. Deep to the presumed lumpectomy site is a centrally hypoattenuating lesion of 2.8 x 2.2 cm on 05/02. This is likely not imaged on the prior CT, but present on the 03/07/2020 MRI where it was grossly similar. Hepatobiliary: Multiple hepatic masses are again identified. A high right hepatic lobe lesion measures 2.6 cm on 13/2 versus 1.8 cm on 02/19/2020. A wedge-shaped mass within segments 4 and 2 of the liver measures on the order of 10.5 x 5.1 cm on 19/2. Significantly enlarged from 02/19/2020, where lesions in this region measured up to 3.0 cm. Increased from on the order of 3.3 cm on 03/07/2020. Cholecystectomy without biliary duct dilatation. Pancreas: The pancreas demonstrates similar atrophy with moderate duct dilatation at 9 mm on 29/2. Duct dilatation is followed to the level of the pancreatic head, with there is mild soft tissue fullness but no well-defined mass. Example 34/2. This is unchanged. The pancreatic tail demonstrates developing relative soft tissue fullness compared to the pancreatic body. Example at on the order of 2.3 cm in thickness on 25/2 versus 1.4 cm on 02/19/2020. Spleen: Normal in size, without focal abnormality. Adrenals/Urinary Tract: Normal adrenal glands. Upper pole left renal lesion of 1.6 cm is greater than fluid density but was determined a complex cyst on prior MRI. Normal right kidney. Contrast in the urinary bladder secondary to reported outside CT performed yesterday. Stomach/Bowel: Tiny hiatal hernia. Normal distal stomach. Moderate amount of stool within the  rectum.  Scattered colonic diverticula. Normal terminal ileum. Normal small bowel caliber. Vascular/Lymphatic: Aortic atherosclerosis. Diffuse portal vein thrombosis again identified. Thrombus involves the superior aspect of the superior mesenteric vein, chronic. New and progressive abdominal adenopathy. An aortocaval node measures 1.0 cm on 39/2 versus 7 mm on 02/19/2020 (when remeasured). A necrotic node along the celiac, just posterior to the pancreatic body measures 1.9 cm on 30/2 and is new. Developing adenopathy in the gastrohepatic ligament including at 1.0 cm on 24/2. More ill-defined hypoattenuating lesions in the peripancreatic space, including at 2.6 cm in the portacaval space on 29/2, increased from 2.3 cm on 02/19/2020. No pelvic sidewall adenopathy. Reproductive: Hysterectomy.  No adnexal mass. Other: Since 02/19/2020, development of small volume abdominopelvic ascites. Peritoneal nodularity, including within the pelvic cul-de-sac on 78/2 and along the right-side of the abdomen on 39/2. No free intraperitoneal air. A new periumbilical nodule or node measures 1.9 cm on 57/2. Musculoskeletal: No acute osseous abnormality. Trace L4-5 anterolisthesis. Degenerate disc disease at the lumbosacral junction. IMPRESSION: 1. When compared to multiple prior exams, including most direct comparison to the 02/19/2020 CT, new or progressive metastatic disease, most definite involving necrotic abdominal nodes and the peritoneum as detailed above. Developing periumbilical nodularity, also likely metastatic. 2. Progressive liver lesions, also most likely indicative of metastatic disease. In the setting of portal vein thrombosis, progressive abscesses are possible but less likely. 3. Abnormal appearance of the pancreas, at least partially felt to be attributed to chronic pancreatitis. Developing soft tissue fullness within the pancreatic tail, with considerations of primary adenocarcinoma, metastatic disease, or  superimposed acute pancreatitis. Peripancreatic hypoattenuating lesions, some of which are increased compared to 02/19/2020. These could be secondary to prior pancreatitis or also represent necrotic nodal metastasis. 4. Left breast mass in the setting of prior lumpectomy for carcinoma. Suspicious for new breast primary, incompletely imaged. 5.  Possible constipation. 6. New small volume abdominopelvic ascites. 7. Similar small right pleural effusion compared to the MRI of 09/26/2020. Electronically Signed: By: Abigail Miyamoto M.D. On: 10/03/2020 12:32   MR ABDOMEN MRCP WO CONTRAST  Result Date: 09/27/2020 CLINICAL DATA:  Pancreatitis. Pancreatic and liver lesions on prior MRI. Abdominal pain. EXAM: MRI ABDOMEN WITHOUT CONTRAST  (INCLUDING MRCP) TECHNIQUE: Multiplanar multisequence MR imaging of the abdomen was performed. Heavily T2-weighted images of the biliary and pancreatic ducts were obtained, and three-dimensional MRCP images were rendered by post processing. COMPARISON:  03/07/2020 MRI FINDINGS: Despite efforts by the technologist and patient, severe motion artifact is present on today's exam and could not be eliminated. This reduces exam sensitivity and specificity. Due to pain, the patient terminated the exam prior to obtaining full diagnostic sequences. Lower chest: Small right pleural effusion is new compared to prior MRI. Hepatobiliary: There is been evolution of the previous regions of stippled T2 signal hyperintensity scattered in the liver to current appearance of hazy accentuated T2 signal for example on image 9 of series 4. On biopsy, these were felt to represent areas of periductal fibrosis, chronic lymphoplasmacytic infiltrate, hemosiderin deposition, and regenerative hyperplasia suggesting finding secondary to portal vein thrombosis. This process is significantly expanded throughout segment 4a and 4B of the liver which not demonstrates diffuse hazy accentuated T2 signal as shown on image 14 series  4. There is associated reduced T1 signal in these regions of involvement. No steatosis. There is some associated restriction of diffusion in these regions of involvement. Unfortunately the MRCP images are severely blurred. No obvious extrahepatic biliary dilatation. There is some mild narrowing of the  common hepatic duct along the porta hepatis for example on image 16 series 3, probably due to surrounding soft tissue swelling and possibly periportal fibrosis. As before, there is high T2 signal intensity material throughout the somewhat dilated intrahepatic portal venous system probably from thrombosis. Periportal edema noted. Pancreas: Peripancreatic edema noted with stippled accentuated T2 signal in the pancreas, and substantially dilated segments of the dorsal pancreatic duct especially in the pancreatic body, up to 1.0 cm in diameter. T2 signal hyperintensity partially involving the pancreatic head measuring about 4.5 by 1.6 by 2.6 cm, extending cephalad along the porta hepatis, I am uncertain whether this represents a thrombosed varix from the portal vein, pseudocyst, or a cystic pancreatic neoplasm. This appears roughly similar to the prior exam. Spleen:  Unremarkable Adrenals/Urinary Tract: Small renal lesions of varying complexity are present and probably a combination of simple and complex cysts, although enhancement characteristics are not interrogated today. The adrenal glands appear normal. Stomach/Bowel: Unremarkable Vascular/Lymphatic: Aortoiliac atherosclerotic vascular disease. 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 Collaterals along the porta hepatis. Other: Ascites particularly along the paracolic gutters and in the pelvis (pelvic ascites seen on coronal images). Mesenteric edema mildly improved in the central mesentery compared to previous. Omental edema noted. There is hazy stranding in  the root of the mesentery as on prior exams. Musculoskeletal: Lower lumbar spondylosis and degenerative disc disease. IMPRESSION: 1. Despite efforts by the technologist and patient, severe motion artifact is present on today's exam and could not be eliminated. The patient was also unable to complete the full sequence of the exam. This reduces exam sensitivity and specificity. 2. Evolution of the appearance in the liver with the previously stippled T2 signal hyperintensities now appearing as hazy increased T2 signal, and with substantially increased involvement throughout segment 4 and 4B of the liver. Based on prior biopsy results this likely represents combination of fibrosis, lymphoplasmacytic infiltrate, hemosiderin deposition, and periportal hyperplasia resulting from portal vein thrombosis. 3. 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 No obvious extrahepatic biliary dilatation. There is some mild narrowing of the common hepatic duct along the porta hepatis, probably due to surrounding soft tissue swelling and possibly periportal fibrosis. 4. Peripancreatic edema with stippled accentuated T2 signal in the pancreas, and substantially dilated segments of the dorsal pancreatic duct especially in the pancreatic body. 5. Lobulated T2 signal hyperintensity along the pancreatic head, possibly a pseudocyst or a thrombosed varix of the portal vein, less likely cystic pancreatic neoplasm. 6. Small right pleural effusion is new compared to previous MRI. 7. Reduced central mesenteric edema compared to previous, although there is still some peripancreatic edema as well as mesenteric edema and ascites. 8. Lower lumbar spondylosis and degenerative disc disease. Electronically Signed   By: Van Clines M.D.   On: 09/27/2020 16:11   US Paracentesis  Result Date: 10/09/2020 INDICATION: 75 year old female  with history of breast cancer and abdominal pain found to have large volume ascites. EXAM: ULTRASOUND GUIDED left lower quadrant PARACENTESIS MEDICATIONS: None. COMPLICATIONS: None immediate. PROCEDURE: Informed written consent was obtained from the patient after a discussion of the risks, benefits and alternatives to treatment. A timeout was performed prior to the initiation of the procedure. Initial ultrasound scanning demonstrates a large amount of ascites within the left lower abdominal quadrant. The right lower abdomen was prepped  and draped in the usual sterile fashion. 1% lidocaine was used for local anesthesia. Following this, a 6 Fr Safe-T-Centesis catheter was introduced. An ultrasound image was saved for documentation purposes. The paracentesis was performed. The catheter was removed and a dressing was applied. The patient tolerated the procedure well without immediate post procedural complication. FINDINGS: A total of approximately 3.05 L of translucent, straw-colored fluid was removed. Samples were sent to the laboratory as requested by the clinical team. IMPRESSION: Successful ultrasound-guided paracentesis yielding 3.05 liters of peritoneal fluid. Ruthann Cancer, MD [facility] Radiology Electronically Signed   By: Ruthann Cancer MD   On: 10/09/2020 15:53   DG Chest Portable 1 View  Result Date: 10/09/2020 CLINICAL DATA:  Altered mental status.  Rule out pneumonia EXAM: PORTABLE CHEST 1 VIEW COMPARISON:  10/04/2020 FINDINGS: Progression of right lower lobe airspace disease.  No effusion Left lung remains clear.  Negative for heart failure. IMPRESSION: Progression of right lower lobe airspace disease which may represent atelectasis or pneumonia. Electronically Signed   By: Franchot Gallo M.D.   On: 10/09/2020 10:11   [place] CORE BIOPSY (SOFT TISSUE)  Result Date: 10/04/2020 INDICATION: 75 year old woman with history of breast cancer presented to  interventional radiology for biopsy of new right chest wall mass. EXAM: Ultrasound-guided biopsy of right chest wall mass MEDICATIONS: None. ANESTHESIA/SEDATION: Moderate (conscious) sedation was employed during this procedure. A total of Versed 2 mg and Fentanyl 100 mcg was administered intravenously. Moderate Sedation Time: 8 minutes. The patient's level of consciousness and vital signs were monitored continuously by radiology nursing throughout the procedure under my direct supervision. COMPLICATIONS: None immediate. PROCEDURE: Informed written consent was obtained from the patient after a thorough discussion of the procedural risks, benefits and alternatives. All questions were addressed. Maximal Sterile Barrier Technique was utilized including caps, mask, sterile gowns, sterile gloves, sterile drape, hand hygiene and skin antiseptic. A timeout was performed prior to the initiation of the procedure. Patient position supine on the ultrasound table. Right anterior lower chest wall skin prepped and draped in usual sterile fashion. Following local lidocaine administration, 18 gauge biopsy needle was advanced into the right anterior chest wall mass, and four cores were obtained utilizing continuous ultrasound guidance. Samples were sent to pathology in formalin. Needle removed and hemostasis achieved with 5 minutes of manual compression. Post procedure ultrasound images showed no evidence of significant hemorrhage. IMPRESSION: Ultrasound-guided biopsy of right anterior chest wall mass. Electronically Signed   By: Miachel Roux M.D.   On: 10/04/2020 17:04       Subjective: Pt reports pain controlled.  Tolerating diet a bit better.  Confusion improved.  No acute events reported.   Discharge Exam: Vitals:   10/12/20 0907 10/12/20 1141  BP: 108/74 105/76  Pulse: 91 (!) 110  Resp: 18 18  Temp: 98.1 F (36.7 C) (!) 97.5 F (36.4 C)  SpO2: 98% 100%   Vitals:   10/12/20 0117 10/12/20 0453 10/12/20 0907  10/12/20 1141  BP: 112/65 107/70 108/74 105/76  Pulse: 82 (!) 105 91 (!) 110  Resp: $Remo'16 16 18 18  'zoExW$ Temp: 98.2 F (36.8 C) (!) 97.5 F (36.4 C) 98.1 F (36.7 C) (!) 97.5 F (36.4 C)  TempSrc: Oral Oral Oral Oral  SpO2: 97% 100% 98% 100%  Weight:      Height:        General: Pt is alert, awake, not in acute distress Cardiovascular: RRR, S1/S2 +, no rubs, no gallops Respiratory: CTA  bilaterally, no wheezing, no rhonchi Abdominal: Soft, NT, ND, bowel sounds + Extremities: no edema, no cyanosis    The results of significant diagnostics from this hospitalization (including imaging, microbiology, ancillary and laboratory) are listed below for reference.     Microbiology: Recent Results (from the past 240 hour(s))  Resp Panel by RT-PCR (Flu A&B, Covid) Nasopharyngeal Swab     Status: None   Collection Time: 10/03/20  1:59 PM   Specimen: Nasopharyngeal Swab; Nasopharyngeal(NP) swabs in vial transport medium  Result Value Ref Range Status   SARS Coronavirus 2 by RT PCR NEGATIVE NEGATIVE Final    Comment: (NOTE) SARS-CoV-2 target nucleic acids are NOT DETECTED.  The SARS-CoV-2 RNA is generally detectable in upper respiratory specimens during the acute phase of infection. The lowest concentration of SARS-CoV-2 viral copies this assay can detect is 138 copies/mL. A negative result does not preclude SARS-Cov-2 infection and should not be used as the sole basis for treatment or other patient management decisions. A negative result may occur with  improper specimen collection/handling, submission of specimen other than nasopharyngeal swab, presence of viral mutation(s) within the areas targeted by this assay, and inadequate number of viral copies(<138 copies/mL). A negative result must be combined with clinical observations, patient history, and epidemiological information. The expected result is Negative.  Fact Sheet for Patients:  EntrepreneurPulse.com.au  Fact  Sheet for Healthcare Providers:  IncredibleEmployment.be  This test is no t yet approved or cleared by the [place] FDA and  has been authorized for detection and/or diagnosis of SARS-CoV-2 by FDA under an Emergency Use Authorization (EUA). This EUA will remain  in effect (meaning this test can be used) for the duration of the COVID-19 declaration under Section 564(b)(1) of the Act, 21 U.S.C.section 360bbb-3(b)(1), unless the authorization is terminated  or revoked sooner.       Influenza A by PCR NEGATIVE NEGATIVE Final   Influenza B by PCR NEGATIVE NEGATIVE Final    Comment: (NOTE) The Xpert Xpress SARS-CoV-2/FLU/RSV plus assay is intended as an aid in the diagnosis of influenza from Nasopharyngeal swab specimens and should not be used as a sole basis for treatment. Nasal washings and aspirates are unacceptable for Xpert Xpress SARS-CoV-2/FLU/RSV testing.  Fact Sheet for Patients: EntrepreneurPulse.com.au  Fact Sheet for Healthcare Providers: IncredibleEmployment.be  This test is not yet approved or cleared by the [place] FDA and has been authorized for detection and/or diagnosis of SARS-CoV-2 by FDA under an Emergency Use Authorization (EUA). This EUA will remain in effect (meaning this test can be used) for the duration of the COVID-19 declaration under Section 564(b)(1) of the Act, 21 U.S.C. section 360bbb-3(b)(1), unless the authorization is terminated or revoked.  Performed at [facility], [address] [place], [place] [address]   Blood culture (routine x 2)     Status: None   Collection Time: 10/03/20  2:06 PM   Specimen: BLOOD  Result Value Ref Range Status   Specimen Description BLOOD BLOOD RIGHT HAND  Final   Special Requests   Final    BOTTLES DRAWN AEROBIC ONLY Blood Culture adequate volume   Culture   Final    NO GROWTH 5 DAYS Performed at [facility], [address], [place], [place] [address]    Report Status 10/08/2020 FINAL  Final  Blood culture (routine x 2)     Status: None   Collection Time: 10/03/20  2:07 PM   Specimen: BLOOD  Result Value Ref Range Status  Specimen Description BLOOD LEFT ANTECUBITAL  Final   Special Requests   Final    BOTTLES DRAWN AEROBIC AND ANAEROBIC Blood Culture results may not be optimal due to an excessive volume of blood received in culture bottles   Culture   Final    NO GROWTH 5 DAYS Performed at [facility], [address], [place], [place] [address]    Report Status 10/08/2020 FINAL  Final  Urine culture     Status: Abnormal   Collection Time: 10/06/20  8:50 PM   Specimen: Urine, Random  Result Value Ref Range Status   Specimen Description   Final    URINE, RANDOM Performed at [facility], [address] [place], [place] [address]    Special Requests   Final    NONE Performed at [facility], [address] [place], [place] [address]    Culture (A)  Final    <10,000 COLONIES/mL INSIGNIFICANT GROWTH Performed at [facility], [place] [address], [place], [place] [address]    Report Status 10/08/2020 FINAL  Final  Resp Panel by RT-PCR (Flu A&B, Covid) Nasopharyngeal Swab     Status: None   Collection Time: 10/09/20 11:55 AM   Specimen: Nasopharyngeal Swab; Nasopharyngeal(NP) swabs in vial transport medium  Result Value Ref Range Status   SARS Coronavirus 2 by RT PCR NEGATIVE NEGATIVE Final    Comment: (NOTE) SARS-CoV-2 target nucleic acids are NOT DETECTED.  The SARS-CoV-2 RNA is generally detectable in upper respiratory specimens during the acute phase of infection. The lowest concentration of SARS-CoV-2 viral copies this assay can detect is 138 copies/mL. A negative result does not preclude SARS-Cov-2 infection and should not be used as the sole basis for treatment or other patient management decisions. A negative result may occur with  improper specimen  collection/handling, submission of specimen other than nasopharyngeal swab, presence of viral mutation(s) within the areas targeted by this assay, and inadequate number of viral copies(<138 copies/mL). A negative result must be combined with clinical observations, patient history, and epidemiological information. The expected result is Negative.  Fact Sheet for Patients:  EntrepreneurPulse.com.au  Fact Sheet for Healthcare Providers:  IncredibleEmployment.be  This test is no t yet approved or cleared by the [place] FDA and  has been authorized for detection and/or diagnosis of SARS-CoV-2 by FDA under an Emergency Use Authorization (EUA). This EUA will remain  in effect (meaning this test can be used) for the duration of the COVID-19 declaration under Section 564(b)(1) of the Act, 21 U.S.C.section 360bbb-3(b)(1), unless the authorization is terminated  or revoked sooner.       Influenza A by PCR NEGATIVE NEGATIVE Final   Influenza B by PCR NEGATIVE NEGATIVE Final    Comment: (NOTE) The Xpert Xpress SARS-CoV-2/FLU/RSV plus assay is intended as an aid in the diagnosis of influenza from Nasopharyngeal swab specimens and should not be used as a sole basis for treatment. Nasal washings and aspirates are unacceptable for Xpert Xpress SARS-CoV-2/FLU/RSV testing.  Fact Sheet for Patients: EntrepreneurPulse.com.au  Fact Sheet for Healthcare Providers: IncredibleEmployment.be  This test is not yet approved or cleared by the [place] FDA and has been authorized for detection and/or diagnosis of SARS-CoV-2 by FDA under an Emergency Use Authorization (EUA). This EUA will remain in effect (meaning this test can be used) for the duration of the COVID-19 declaration under Section 564(b)(1) of the Act, 21 U.S.C. section 360bbb-3(b)(1), unless the authorization is terminated or revoked.  Performed at [facility], [address] [place], [place] [address]   Blood culture (routine x 2)     Status: None (Preliminary result)   Collection Time: 10/09/20 11:55 AM   Specimen: BLOOD  Result Value Ref Range Status   Specimen Description BLOOD RIGHT ANTECUBITAL  Final   Special Requests   Final    BOTTLES DRAWN AEROBIC AND ANAEROBIC Blood Culture adequate volume   Culture   Final    NO GROWTH 3 DAYS Performed at [facility], [address] [place], [place] [address]    Report Status PENDING  Incomplete  Body fluid culture w Gram Stain     Status: None (Preliminary result)   Collection Time: 10/09/20  2:45 PM   Specimen: PATH Cytology Peritoneal fluid  Result Value Ref Range Status   Specimen Description   Final    PERITONEAL Performed at [facility], [address] [place], [place] [address]    Special Requests   Final    NONE Performed at [facility], [address] [place], [place] [address]    Gram Stain   Final    RARE WBC PRESENT, PREDOMINANTLY PMN NO ORGANISMS SEEN    Culture   Final    NO GROWTH 2 DAYS Performed at [facility], [place] [address], [place], [place] [address]    Report Status PENDING  Incomplete  Blood culture (routine x 2)     Status: None (Preliminary result)   Collection Time: 10/09/20  6:45 PM   Specimen: BLOOD  Result Value Ref Range Status   Specimen Description BLOOD LEFT ANTECUBITAL  Final   Special Requests   Final    BOTTLES DRAWN AEROBIC AND ANAEROBIC Blood Culture adequate volume   Culture   Final    NO GROWTH 3 DAYS Performed at [facility], [address] [place], [place] [address]    Report Status PENDING  Incomplete     Labs: BNP (last 3 results) No results for input(s): BNP in the last 8760 hours. Basic Metabolic Panel: Recent Labs  Lab 10/06/20 1419 10/09/20 0821 10/10/20 0523 10/11/20 0435 10/12/20 0446  NA 139 139 139 138 137  K 4.2 4.3 4.0 4.6 4.2  CL 108 107 110  109 110  CO2 22 24 20* 21* 23  GLUCOSE 132* 119* 121* 121* 109*  BUN 24* 28* 25* 28* 32*  CREATININE 0.72 0.70 0.62 0.67 0.64  CALCIUM 9.8 9.6 9.1 8.7* 8.6*  MG  --   --  2.2 2.3 2.2  PHOS  --   --  3.0 4.0 2.5   Liver Function Tests: Recent Labs  Lab 10/06/20 1419 10/09/20 0821  AST 28 25  ALT 12 13  ALKPHOS 93 97  BILITOT 0.9 0.6  PROT 7.0 6.8  ALBUMIN 3.0* 3.0*   Recent Labs  Lab 10/06/20 1419 10/09/20 0821  LIPASE 48 24   Recent Labs  Lab 10/09/20 1005  AMMONIA 11   CBC: Recent Labs  Lab 10/06/20 1419 10/09/20 0821 10/10/20 0523 10/11/20 0435 10/12/20 0446  WBC 16.6* 13.7* 13.3* 9.5 17.7*  NEUTROABS  --  11.0*  --   --   --   HGB 10.3* 10.0* 9.6* 9.2* 9.6*  HCT 32.9* 32.4* 28.9* 29.4* 30.2*  MCV 84.4 84.4 81.6 83.8 83.2  PLT 235 265 198 148* 128*   Cardiac Enzymes: No results for input(s): CKTOTAL, CKMB, CKMBINDEX, TROPONINI in the last 168 hours. BNP: Invalid input(s): POCBNP CBG: No results  for input(s): GLUCAP in the last 168 hours. D-Dimer No results for input(s): DDIMER in the last 72 hours. Hgb A1c No results for input(s): HGBA1C in the last 72 hours. Lipid Profile No results for input(s): CHOL, HDL, LDLCALC, TRIG, CHOLHDL, LDLDIRECT in the last 72 hours. Thyroid function studies No results for input(s): TSH, T4TOTAL, T3FREE, THYROIDAB in the last 72 hours.  Invalid input(s): FREET3 Anemia work up No results for input(s): VITAMINB12, FOLATE, FERRITIN, TIBC, IRON, RETICCTPCT in the last 72 hours. Urinalysis    Component Value Date/Time   COLORURINE AMBER (A) 10/09/2020 0857   APPEARANCEUR CLOUDY (A) 10/09/2020 0857   LABSPEC 1.031 (H) 10/09/2020 0857   PHURINE 5.0 10/09/2020 [place] 10/09/2020 0857   HGBUR NEGATIVE 10/09/2020 0857   BILIRUBINUR NEGATIVE 10/09/2020 0857   KETONESUR 5 (A) 10/09/2020 0857   PROTEINUR 30 (A) 10/09/2020 0857   NITRITE NEGATIVE 10/09/2020 0857   LEUKOCYTESUR TRACE (A) 10/09/2020 0857    Sepsis Labs Invalid input(s): PROCALCITONIN,  WBC,  LACTICIDVEN Microbiology Recent Results (from the past 240 hour(s))  Resp Panel by RT-PCR (Flu A&B, Covid) Nasopharyngeal Swab     Status: None   Collection Time: 10/03/20  1:59 PM   Specimen: Nasopharyngeal Swab; Nasopharyngeal(NP) swabs in vial transport medium  Result Value Ref Range Status   SARS Coronavirus 2 by RT PCR NEGATIVE NEGATIVE Final    Comment: (NOTE) SARS-CoV-2 target nucleic acids are NOT DETECTED.  The SARS-CoV-2 RNA is generally detectable in upper respiratory specimens during the acute phase of infection. The lowest concentration of SARS-CoV-2 viral copies this assay can detect is 138 copies/mL. A negative result does not preclude SARS-Cov-2 infection and should not be used as the sole basis for treatment or other patient management decisions. A negative result may occur with  improper specimen collection/handling, submission of specimen other than nasopharyngeal swab, presence of viral mutation(s) within the areas targeted by this assay, and inadequate number of viral copies(<138 copies/mL). A negative result must be combined with clinical observations, patient history, and epidemiological information. The expected result is Negative.  Fact Sheet for Patients:  EntrepreneurPulse.com.au  Fact Sheet for Healthcare Providers:  IncredibleEmployment.be  This test is no t yet approved or cleared by the [place] FDA and  has been authorized for detection and/or diagnosis of SARS-CoV-2 by FDA under an Emergency Use Authorization (EUA). This EUA will remain  in effect (meaning this test can be used) for the duration of the COVID-19 declaration under Section 564(b)(1) of the Act, 21 U.S.C.section 360bbb-3(b)(1), unless the authorization is terminated  or revoked sooner.       Influenza A by PCR NEGATIVE NEGATIVE Final   Influenza B by PCR NEGATIVE NEGATIVE Final     Comment: (NOTE) The Xpert Xpress SARS-CoV-2/FLU/RSV plus assay is intended as an aid in the diagnosis of influenza from Nasopharyngeal swab specimens and should not be used as a sole basis for treatment. Nasal washings and aspirates are unacceptable for Xpert Xpress SARS-CoV-2/FLU/RSV testing.  Fact Sheet for Patients: EntrepreneurPulse.com.au  Fact Sheet for Healthcare Providers: IncredibleEmployment.be  This test is not yet approved or cleared by the [place] FDA and has been authorized for detection and/or diagnosis of SARS-CoV-2 by FDA under an Emergency Use Authorization (EUA). This EUA will remain in effect (meaning this test can be used) for the duration of the COVID-19 declaration under Section 564(b)(1) of the Act, 21 U.S.C. section 360bbb-3(b)(1), unless the authorization is terminated or revoked.  Performed at [facility], [address] [place], [place] [address]   Blood culture (routine x 2)     Status: None   Collection Time: 10/03/20  2:06 PM   Specimen: BLOOD  Result Value Ref Range Status   Specimen Description BLOOD BLOOD RIGHT HAND  Final   Special Requests   Final    BOTTLES DRAWN AEROBIC ONLY Blood Culture adequate volume   Culture   Final    NO GROWTH 5 DAYS Performed at [facility], [address] [place], [place] [address]    Report Status 10/08/2020 FINAL  Final  Blood culture (routine x 2)     Status: None   Collection Time: 10/03/20  2:07 PM   Specimen: BLOOD  Result Value Ref Range Status   Specimen Description BLOOD LEFT ANTECUBITAL  Final   Special Requests   Final    BOTTLES DRAWN AEROBIC AND ANAEROBIC Blood Culture results may not be optimal due to an excessive volume of blood received in culture bottles   Culture   Final    NO GROWTH 5 DAYS Performed at [facility], [address], [place], [place] [address]    Report Status 10/08/2020 FINAL  Final  Urine culture      Status: Abnormal   Collection Time: 10/06/20  8:50 PM   Specimen: Urine, Random  Result Value Ref Range Status   Specimen Description   Final    URINE, RANDOM Performed at [facility], [address], [place], [place] [address]    Special Requests   Final    NONE Performed at [facility], [address], [place], [place] [address]    Culture (A)  Final    <10,000 COLONIES/mL INSIGNIFICANT GROWTH Performed at [facility], [place] [address] [place], [place] [address]    Report Status 10/08/2020 FINAL  Final  Resp Panel by RT-PCR (Flu A&B, Covid) Nasopharyngeal Swab     Status: None   Collection Time: 10/09/20 11:55 AM   Specimen: Nasopharyngeal Swab; Nasopharyngeal(NP) swabs in vial transport medium  Result Value Ref Range Status   SARS Coronavirus 2 by RT PCR NEGATIVE NEGATIVE Final    Comment: (NOTE) SARS-CoV-2 target nucleic acids are NOT DETECTED.  The SARS-CoV-2 RNA is generally detectable in upper respiratory specimens during the acute phase of infection. The lowest concentration of SARS-CoV-2 viral copies this assay can detect is 138 copies/mL. A negative result does not preclude SARS-Cov-2 infection and should not be used as the sole basis for treatment or other patient management decisions. A negative result may occur with  improper specimen collection/handling, submission of specimen other than nasopharyngeal swab, presence of viral mutation(s) within the areas targeted by this assay, and inadequate number of viral copies(<138 copies/mL). A negative result must be combined with clinical observations, patient history, and epidemiological information. The expected result is Negative.  Fact Sheet for Patients:  EntrepreneurPulse.com.au  Fact Sheet for Healthcare Providers:  IncredibleEmployment.be  This test is no t yet approved or cleared by the [place] FDA and  has been authorized for detection  and/or diagnosis of SARS-CoV-2 by FDA under an Emergency Use Authorization (EUA). This EUA will remain  in effect (meaning this test can be used) for the duration of the COVID-19 declaration under Section 564(b)(1) of the Act, 21 U.S.C.section 360bbb-3(b)(1), unless the authorization is terminated  or revoked sooner.       Influenza A by PCR NEGATIVE NEGATIVE Final   Influenza B by PCR  NEGATIVE NEGATIVE Final    Comment: (NOTE) The Xpert Xpress SARS-CoV-2/FLU/RSV plus assay is intended as an aid in the diagnosis of influenza from Nasopharyngeal swab specimens and should not be used as a sole basis for treatment. Nasal washings and aspirates are unacceptable for Xpert Xpress SARS-CoV-2/FLU/RSV testing.  Fact Sheet for Patients: EntrepreneurPulse.com.au  Fact Sheet for Healthcare Providers: IncredibleEmployment.be  This test is not yet approved or cleared by the [place] FDA and has been authorized for detection and/or diagnosis of SARS-CoV-2 by FDA under an Emergency Use Authorization (EUA). This EUA will remain in effect (meaning this test can be used) for the duration of the COVID-19 declaration under Section 564(b)(1) of the Act, 21 U.S.C. section 360bbb-3(b)(1), unless the authorization is terminated or revoked.  Performed at [facility], [address] [place], [place] [address]   Blood culture (routine x 2)     Status: None (Preliminary result)   Collection Time: 10/09/20 11:55 AM   Specimen: BLOOD  Result Value Ref Range Status   Specimen Description BLOOD RIGHT ANTECUBITAL  Final   Special Requests   Final    BOTTLES DRAWN AEROBIC AND ANAEROBIC Blood Culture adequate volume   Culture   Final    NO GROWTH 3 DAYS Performed at [facility], [address], [place], [place] [address]    Report Status PENDING  Incomplete  Body fluid culture w Gram Stain     Status: None (Preliminary result)   Collection  Time: 10/09/20  2:45 PM   Specimen: PATH Cytology Peritoneal fluid  Result Value Ref Range Status   Specimen Description   Final    PERITONEAL Performed at [facility], [address] [place], [place] [address]    Special Requests   Final    NONE Performed at [facility], [address] [place], [place] [address]    Gram Stain   Final    RARE WBC PRESENT, PREDOMINANTLY PMN NO ORGANISMS SEEN    Culture   Final    NO GROWTH 2 DAYS Performed at [facility], [place] [address] [place], [place] [address]    Report Status PENDING  Incomplete  Blood culture (routine x 2)     Status: None (Preliminary result)   Collection Time: 10/09/20  6:45 PM   Specimen: BLOOD  Result Value Ref Range Status   Specimen Description BLOOD LEFT ANTECUBITAL  Final   Special Requests   Final    BOTTLES DRAWN AEROBIC AND ANAEROBIC Blood Culture adequate volume   Culture   Final    NO GROWTH 3 DAYS Performed at [facility], [address], [place], [place] [address]    Report Status PENDING  Incomplete     Time coordinating discharge: Over 30 minutes  SIGNED:   Ezekiel Slocumb, DO [facility] 10/12/2020, 2:23 PM   If 7PM-7AM, please contact night-coverage [email or web address]

## 2020-10-12 NOTE — Plan of Care (Signed)
  Problem: Health Behavior/Discharge Planning: Goal: Ability to manage health-related needs will improve 10/12/2020 1519 by Milinda Hirschfeld, RN Outcome: Adequate for Discharge 10/12/2020 0739 by Milinda Hirschfeld, RN Outcome: Progressing   Problem: Clinical Measurements: Goal: Ability to maintain clinical measurements within normal limits will improve 10/12/2020 1519 by Milinda Hirschfeld, RN Outcome: Adequate for Discharge 10/12/2020 0739 by Milinda Hirschfeld, RN Outcome: Progressing Goal: Will remain free from infection 10/12/2020 1519 by Milinda Hirschfeld, RN Outcome: Adequate for Discharge 10/12/2020 0739 by Milinda Hirschfeld, RN Outcome: Progressing Goal: Cardiovascular complication will be avoided 10/12/2020 1519 by Milinda Hirschfeld, RN Outcome: Adequate for Discharge 10/12/2020 0739 by Milinda Hirschfeld, RN Outcome: Progressing   Problem: Activity: Goal: Risk for activity intolerance will decrease 10/12/2020 1519 by Milinda Hirschfeld, RN Outcome: Adequate for Discharge 10/12/2020 0739 by Milinda Hirschfeld, RN Outcome: Progressing   Problem: Nutrition: Goal: Adequate nutrition will be maintained 10/12/2020 1519 by Milinda Hirschfeld, RN Outcome: Adequate for Discharge 10/12/2020 0739 by Milinda Hirschfeld, RN Outcome: Progressing   Problem: Coping: Goal: Level of anxiety will decrease 10/12/2020 1519 by Milinda Hirschfeld, RN Outcome: Adequate for Discharge 10/12/2020 0739 by Milinda Hirschfeld, RN Outcome: Progressing   Problem: Pain Managment: Goal: General experience of comfort will improve 10/12/2020 1519 by Milinda Hirschfeld, RN Outcome: Adequate for Discharge 10/12/2020 0739 by Milinda Hirschfeld, RN Outcome: Progressing   Problem: Safety: Goal: Ability to remain free from injury will improve 10/12/2020 1519 by Milinda Hirschfeld, RN Outcome: Adequate for Discharge 10/12/2020 0739 by Milinda Hirschfeld, RN Outcome: Progressing

## 2020-10-12 NOTE — TOC Transition Note (Signed)
Transition of Care [facility]) - CM/SW Discharge Note   Patient Details  Name: Theresa Andrews MRN: [id number] Date of Birth: November 13, 1945  [facility] CM/SW Contact:  Harriet Masson, RN Phone [phone] 10/12/2020, 2:49 PM   Clinical Narrative:    RN has spoken with pt earlier today on possible [facility] needs for PT/OT pt did not have a preference for agencies. RN spoke with Helene Kelp via [facility] who will accept pt for PT/OT and RN services. Spoke with the pt's daughter Mickel Baas to confirm all arrangements if discharge today and verified a good support system with 3 daughters one next door along with a grand-daughter. Aware nursing will probably visit earlier in the week with PT/OT to follow 3-5 days via discharge. Pt obtains her medications from [facility] and get to all her scheduled medical appointments. Stress pt will need some isolation due to her low immune system from received treatment as reported by the bedside nurse. Pt states she already has  Rollator for ambulating throughout the home.  No other needs identified.   TOC team will continue to follow up accordingly.        Patient Goals and CMS Choice        Discharge Placement                       Discharge Plan and Services                                     Social Determinants of Health (SDOH) Interventions     Readmission Risk Interventions Readmission Risk Prevention Plan 10/12/2020  Transportation Screening Complete  PCP or Specialist Appt within 3-5 Days Complete  HRI or [place] (No Data)  Social Work Consult for [place] Planning/Counseling [place] Not Applicable  Medication Review Press photographer) Complete  Some recent data might be hidden

## 2020-10-12 NOTE — Plan of Care (Signed)
  Problem: Health Behavior/Discharge Planning: Goal: Ability to manage health-related needs will improve Outcome: Progressing   Problem: Clinical Measurements: Goal: Ability to maintain clinical measurements within normal limits will improve Outcome: Progressing Goal: Will remain free from infection Outcome: Progressing Goal: Cardiovascular complication will be avoided Outcome: Progressing   Problem: Activity: Goal: Risk for activity intolerance will decrease Outcome: Progressing   Problem: Nutrition: Goal: Adequate nutrition will be maintained Outcome: Progressing   Problem: Coping: Goal: Level of anxiety will decrease Outcome: Progressing   Problem: Pain Managment: Goal: General experience of comfort will improve Outcome: Progressing   Problem: Safety: Goal: Ability to remain free from injury will improve Outcome: Progressing

## 2020-10-13 LAB — BODY FLUID CULTURE W GRAM STAIN: Culture: NO GROWTH

## 2020-10-14 ENCOUNTER — Inpatient Hospital Stay: Payer: Medicare PPO

## 2020-10-14 ENCOUNTER — Telehealth: Payer: Self-pay

## 2020-10-14 ENCOUNTER — Encounter: Payer: Self-pay | Admitting: Internal Medicine

## 2020-10-14 ENCOUNTER — Inpatient Hospital Stay (HOSPITAL_BASED_OUTPATIENT_CLINIC_OR_DEPARTMENT_OTHER): Payer: Medicare PPO | Admitting: Hospice and Palliative Medicine

## 2020-10-14 ENCOUNTER — Inpatient Hospital Stay (HOSPITAL_BASED_OUTPATIENT_CLINIC_OR_DEPARTMENT_OTHER): Payer: Medicare PPO | Admitting: Internal Medicine

## 2020-10-14 ENCOUNTER — Other Ambulatory Visit: Payer: Self-pay

## 2020-10-14 VITALS — BP 106/80 | HR 85

## 2020-10-14 DIAGNOSIS — C50812 Malignant neoplasm of overlapping sites of left female breast: Secondary | ICD-10-CM

## 2020-10-14 DIAGNOSIS — Z5111 Encounter for antineoplastic chemotherapy: Secondary | ICD-10-CM | POA: Insufficient documentation

## 2020-10-14 DIAGNOSIS — C801 Malignant (primary) neoplasm, unspecified: Secondary | ICD-10-CM

## 2020-10-14 DIAGNOSIS — Z515 Encounter for palliative care: Secondary | ICD-10-CM

## 2020-10-14 DIAGNOSIS — Z79899 Other long term (current) drug therapy: Secondary | ICD-10-CM | POA: Diagnosis not present

## 2020-10-14 DIAGNOSIS — E860 Dehydration: Secondary | ICD-10-CM | POA: Diagnosis not present

## 2020-10-14 DIAGNOSIS — R42 Dizziness and giddiness: Secondary | ICD-10-CM | POA: Diagnosis not present

## 2020-10-14 DIAGNOSIS — I959 Hypotension, unspecified: Secondary | ICD-10-CM

## 2020-10-14 DIAGNOSIS — R630 Anorexia: Secondary | ICD-10-CM | POA: Diagnosis not present

## 2020-10-14 DIAGNOSIS — K769 Liver disease, unspecified: Secondary | ICD-10-CM

## 2020-10-14 LAB — CBC WITH DIFFERENTIAL/PLATELET
Abs Immature Granulocytes: 0.11 10*3/uL — ABNORMAL HIGH (ref 0.00–0.07)
Basophils Absolute: 0 10*3/uL (ref 0.0–0.1)
Basophils Relative: 0 %
Eosinophils Absolute: 0.2 10*3/uL (ref 0.0–0.5)
Eosinophils Relative: 2 %
HCT: 33.6 % — ABNORMAL LOW (ref 36.0–46.0)
Hemoglobin: 10.4 g/dL — ABNORMAL LOW (ref 12.0–15.0)
Immature Granulocytes: 1 %
Lymphocytes Relative: 4 %
Lymphs Abs: 0.6 10*3/uL — ABNORMAL LOW (ref 0.7–4.0)
MCH: 26.1 pg (ref 26.0–34.0)
MCHC: 31 g/dL (ref 30.0–36.0)
MCV: 84.4 fL (ref 80.0–100.0)
Monocytes Absolute: 0.2 10*3/uL (ref 0.1–1.0)
Monocytes Relative: 1 %
Neutro Abs: 13.1 10*3/uL — ABNORMAL HIGH (ref 1.7–7.7)
Neutrophils Relative %: 92 %
Platelets: UNDETERMINED 10*3/uL (ref 150–400)
RBC: 3.98 MIL/uL (ref 3.87–5.11)
RDW: 17.2 % — ABNORMAL HIGH (ref 11.5–15.5)
WBC: 14.2 10*3/uL — ABNORMAL HIGH (ref 4.0–10.5)
nRBC: 0 % (ref 0.0–0.2)

## 2020-10-14 LAB — CULTURE, BLOOD (ROUTINE X 2)
Culture: NO GROWTH
Culture: NO GROWTH
Special Requests: ADEQUATE
Special Requests: ADEQUATE

## 2020-10-14 LAB — COMPREHENSIVE METABOLIC PANEL
ALT: 20 U/L (ref 0–44)
AST: 30 U/L (ref 15–41)
Albumin: 2.7 g/dL — ABNORMAL LOW (ref 3.5–5.0)
Alkaline Phosphatase: 92 U/L (ref 38–126)
Anion gap: 11 (ref 5–15)
BUN: 40 mg/dL — ABNORMAL HIGH (ref 8–23)
CO2: 20 mmol/L — ABNORMAL LOW (ref 22–32)
Calcium: 9.1 mg/dL (ref 8.9–10.3)
Chloride: 107 mmol/L (ref 98–111)
Creatinine, Ser: 0.96 mg/dL (ref 0.44–1.00)
GFR, Estimated: >60 mL/min (ref >60)
Glucose, Bld: 123 mg/dL — ABNORMAL HIGH (ref 70–99)
Potassium: 4.7 mmol/L (ref 3.5–5.1)
Sodium: 138 mmol/L (ref 135–145)
Total Bilirubin: 0.8 mg/dL (ref 0.3–1.2)
Total Protein: 6.1 g/dL — ABNORMAL LOW (ref 6.5–8.1)

## 2020-10-14 MED ORDER — SODIUM CHLORIDE 0.9 % IV SOLN
INTRAVENOUS | Status: DC
  Filled 2020-10-14 (×3): qty 250

## 2020-10-14 NOTE — Progress Notes (Signed)
Wants to know results from biopsy. Having anxiety and wants to know if she can take anxiety medication. How many rounds of chemo will she have to do if any? More testing during chemo. Very restless

## 2020-10-14 NOTE — Telephone Encounter (Signed)
Transition Care Management Unsuccessful Follow-up Telephone Call  Date of discharge and from where:  10/12/20 [facility]  Attempts:  1st Attempt  Reason for unsuccessful TCM follow-up call:  Voice mail full. Pt may reach me at [phone] to complete TCM call and schedule hospital follow up appt.

## 2020-10-14 NOTE — Patient Instructions (Signed)
#   STOP blood pressure medications-until further directed [amlodipine; metoprolol; telmisartan]  #

## 2020-10-14 NOTE — Progress Notes (Signed)
[place] OFFICE PROGRESS NOTE  Patient Care Team: Steele Sizer, MD as PCP - General (Family Medicine) Cammie Sickle, MD as Consulting Physician (Oncology) Corey Skains, MD as Consulting Physician (Cardiology) Gabriel Carina Betsey Holiday, MD as Consulting Physician (Endocrinology) Baxter Kail, MD as Consulting Physician (Dermatology) Anell Barr, OD as Consulting Physician (Optometry) Pieter Partridge, MD as Referring Physician (Dermatology) Vern Claude, LCSW as Social Worker Neldon Labella, RN as Registered Nurse Germaine Pomfret, [facility] (Pharmacist)  Cancer Staging No matching staging information was found for the patient.   Oncology History Overview Note  # AUG 2011- LEFT BREAST CA Stage II ER/PR- Pos; her 2 NEG; s/p neo-adj ACx4- poor response; s/p lumpec [Dr.Arru]- ypT2 [2.5cm]ypsN-0/2; Taxol weekly 9/12 [sec to PN]; finished march 2012. Arimidex-May 2012; Aug 2013- changed to Aug 2013; sep 2013- Started Tam; Mammo- in April 2017.   # Declines extended anti-hormone therapy; # Feb 2017- Bone scan- Negative [hypercalcemia] ;   # AUG 2021- liver Biopsy- LIVER; BIOPSY:[[facility] II OPINION] - LIVER PARENCHYMA WITH ECTATIC PORTAL VEINS WITH HERNIATION AND FOCI SUGGESTIVE OF ORGANIZING THROMBI, PERIDUCTAL FIBROSIS WITH CHRONIC  LYMPHOPLASMACYTIC INFILTRATE, HEMOSIDERIN DEPOSITION AND PERIPORTAL  REGENERATIVE HYPERPLASIA, SUGGESTIVE OF CHANGES SECONDARY TO PORTAL VEIN  THROMBOSIS (CLINICAL). - FEATURES OF OBSTRUCTIVE BILIARY CHANGES. - NO EVIDENCE OF MALIGNANCY IS SEEN.   # Hypercalcemia- chronic [2016]; primary hyperparathyrdoisim; improved after stopping HCTZ.    # PN- G-1  DIAGNOSIS: breast cancer  STAGE:  II       ;GOALS: cure  CURRENT/MOST RECENT THERAPY: surviellance    Breast CA ([place])  10/29/2014 Initial Diagnosis   Breast CA (HCC)   Carcinoma of overlapping sites of left breast in female, estrogen receptor positive ([place])      INTERVAL  HISTORY:  Theresa Andrews 75 y.o.  female [place] patient above carcinoma-of unknown primary [pancreaticobiliary versus breast-remote history of breast cancer]; portal vein thrombosis on lovenox is here for follow-up.  Patient was recently admitted to the hospital for worsening generalized weakness/abdominal pain.  Patient had paracentesis done that did not show any evidence of any infection.  Patient received carbotaxol chemotherapy x1.  Today day #4.  However as per family patient has been more weak; extremely fatigued.  Poor p.o. intake.  Denies any fevers or chills.  The office patient's blood pressures are systolic 22W; no nausea vomiting.  Review of Systems  Constitutional: Positive for malaise/fatigue and weight loss. Negative for chills, diaphoresis and fever.  HENT: Negative for nosebleeds and sore throat.   Eyes: Negative for double vision.  Respiratory: Negative for cough, hemoptysis, sputum production, shortness of breath and wheezing.   Cardiovascular: Negative for chest pain, palpitations, orthopnea and leg swelling.  Gastrointestinal: Positive for abdominal pain and nausea. Negative for blood in stool, constipation, diarrhea, heartburn, melena and vomiting.  Genitourinary: Negative for dysuria, frequency and urgency.  Musculoskeletal: Negative for back pain and joint pain.  Skin: Negative.  Negative for itching and rash.  Neurological: Positive for dizziness and weakness. Negative for tingling, focal weakness and headaches.  Endo/Heme/Allergies: Does not bruise/bleed easily.  Psychiatric/Behavioral: Negative for depression. The patient is not nervous/anxious and does not have insomnia.       PAST MEDICAL HISTORY :  Past Medical History:  Diagnosis Date  . Adenocarcinoma of unknown primary ([place])   . Anxiety   . Breast cancer ([place])    pT2 pN0 cM0 (stage IIA) invasive mammary carcinoma of left outer breast status post lumpectomy and sentinel node  study on July 14, 2010  . CAD (coronary artery disease)   . Depression   . H/O hypokalemia   . History of chemotherapy   . History of MI (myocardial infarction)   . Hyperglycemia   . Hyperlipidemia   . Hypertension   . Peripheral neuropathy due to chemotherapy ([place])   . Personal history of chemotherapy 2011   left breast ca  . Personal history of radiation therapy 2001   left breast ca    PAST SURGICAL HISTORY :   Past Surgical History:  Procedure Laterality Date  . ABDOMINAL HYSTERECTOMY    . BREAST BIOPSY Left 2008   neg  . BREAST BIOPSY Left 03/05/2010   invasive mammary carcinoma  . BREAST LUMPECTOMY Left 07/14/2010   INVASIVE MAMMARY CARCINOMA WITH PROMINENT INFILTRATIVE PATTERN, negative margins  . CHOLECYSTECTOMY    . COLONOSCOPY WITH PROPOFOL N/A 11/22/2015   Procedure: COLONOSCOPY WITH PROPOFOL;  Surgeon: Hulen Luster, MD;  Location[facility] ENDOSCOPY;  Service: Gastroenterology;  Laterality: N/A;  . CORONARY ANGIOPLASTY WITH STENT PLACEMENT  2008    FAMILY HISTORY :   Family History  Problem Relation Age of Onset  . Breast cancer Sister 65  . Hypertension Mother   . Aneurysm Mother   . Heart attack Sister   . Birth defects Sister     SOCIAL HISTORY:   Social History   Tobacco Use  . Smoking status: Former Smoker    Packs/day: 0.25    Years: 25.00    Pack years: 6.25    Types: Cigarettes    Quit date: 2008    Years since quitting: 14.2  . Smokeless tobacco: Never Used  . Tobacco comment: smoking cessation materials not required  Vaping Use  . Vaping Use: Never used  Substance Use Topics  . Alcohol use: No    Comment: rare; holidays  . Drug use: No    ALLERGIES:  is allergic to tetanus toxoids and shellfish allergy.  MEDICATIONS:  Current Outpatient Medications  Medication Sig Dispense Refill  . amLODipine (NORVASC) 10 MG tablet Take 1 tablet (10 mg total) by mouth daily. 90 tablet 1  . cholecalciferol (VITAMIN D) 1000 units tablet Take 1,000 Units by mouth daily.     Marland Kitchen enoxaparin (LOVENOX) 80 MG/0.8ML injection Inject into the skin.    . feeding supplement (ENSURE ENLIVE / ENSURE PLUS) LIQD Take 237 mLs by mouth 2 (two) times daily between meals. 237 mL 12  . Lactulose 20 GM/30ML SOLN Take 30 mLs (20 g total) by mouth daily as needed. 450 mL 0  . metoprolol succinate (TOPROL-XL) 25 MG 24 hr tablet Take 0.5 tablets (12.5 mg total) by mouth daily. 45 tablet 3  . ondansetron (ZOFRAN) 4 MG tablet Take 1 tablet (4 mg total) by mouth daily as needed for nausea or vomiting. 12 tablet 0  . oxyCODONE-acetaminophen (PERCOCET) 10-325 MG tablet Take 1 tablet by mouth every 6 (six) hours as needed for pain. 12 tablet 0  . PARoxetine (PAXIL) 10 MG tablet Take 1 tablet (10 mg total) by mouth daily. 30 tablet 0  . polyethylene glycol (MIRALAX / GLYCOLAX) 17 g packet Take 17 g by mouth 2 (two) times daily as needed. 14 each 0  . potassium chloride SA (KLOR-CON) 20 MEQ tablet 1 pill twice a day 60 tablet 3  . rosuvastatin (CRESTOR) 10 MG tablet Take 1 tablet (10 mg total) by mouth daily. 90 tablet 1  . telmisartan (MICARDIS) 80 MG tablet One daily  90 tablet 1   No current facility-administered medications for this visit.   Facility-Administered Medications Ordered in Other Visits  Medication Dose Route Frequency Provider Last Rate Last Admin  . 0.9 %  sodium chloride infusion   Intravenous Continuous Cammie Sickle, MD 999 mL/hr at 10/14/20 1632 New Bag at 10/14/20 1632    PHYSICAL EXAMINATION: ECOG PERFORMANCE STATUS: 0 - Asymptomatic  BP (!) 85/61 (BP Location: Left Arm, Patient Position: Sitting, Cuff Size: Normal)   Pulse 92   Temp (!) 95.5 F (35.3 C) (Tympanic)   Resp 16   Ht _0  (1.626 m)   Wt 156 lb (70.8 kg)   SpO2 100%   BMI 26.78 kg/m   Filed Weights   10/14/20 1537  Weight: 156 lb (70.8 kg)   Physical Exam Constitutional:      Comments: Patient appears weak.;  In a wheelchair.  Accompanied by daughter.  HENT:     Head: Normocephalic  and atraumatic.     Mouth/Throat:     Pharynx: No oropharyngeal exudate.  Eyes:     Pupils: Pupils are equal, round, and reactive to light.  Cardiovascular:     Rate and Rhythm: Normal rate and regular rhythm.  Pulmonary:     Effort: Pulmonary effort is normal. No respiratory distress.     Breath sounds: Normal breath sounds. No wheezing.  Abdominal:     General: Bowel sounds are normal. There is no distension.     Palpations: Abdomen is soft. There is no mass.     Tenderness: There is no guarding or rebound.  Musculoskeletal:        General: No tenderness. Normal range of motion.     Cervical back: Normal range of motion and neck supple.  Skin:    General: Skin is warm.  Neurological:     Mental Status: She is alert and oriented to person, place, and time.  Psychiatric:        Mood and Affect: Affect normal.      LABORATORY DATA:  I have reviewed the data as listed    Component Value Date/Time   NA 138 10/14/2020 1448   NA 142 05/29/2020 1344   K 4.7 10/14/2020 1448   CL 107 10/14/2020 1448   CO2 20 (L) 10/14/2020 1448   GLUCOSE 123 (H) 10/14/2020 1448   GLUCOSE 134 (H) 03/01/2013 1414   BUN 40 (H) 10/14/2020 1448   BUN 9 05/29/2020 1344   CREATININE 0.96 10/14/2020 1448   CREATININE 0.63 08/08/2020 0000   CALCIUM 9.1 10/14/2020 1448   PROT 6.1 (L) 10/14/2020 1448   PROT 6.6 05/29/2020 1344   PROT 7.5 03/02/2014 1034   ALBUMIN 2.7 (L) 10/14/2020 1448   ALBUMIN 3.9 05/29/2020 1344   ALBUMIN 3.5 03/02/2014 1034   AST 30 10/14/2020 1448   AST 13 (L) 03/02/2014 1034   ALT 20 10/14/2020 1448   ALT 21 03/02/2014 1034   ALKPHOS 92 10/14/2020 1448   ALKPHOS 76 03/02/2014 1034   BILITOT 0.8 10/14/2020 1448   BILITOT 0.4 05/29/2020 1344   BILITOT 0.2 03/02/2014 1034   GFRNONAA >60 10/14/2020 1448   GFRNONAA 89 08/08/2020 0000   GFRAA 103 08/08/2020 0000    No results found for: SPEP, UPEP  Lab Results  Component Value Date   WBC 14.2 (H) 10/14/2020    NEUTROABS 13.1 (H) 10/14/2020   HGB 10.4 (L) 10/14/2020   HCT 33.6 (L) 10/14/2020   MCV 84.4 10/14/2020   PLT  PLATELET CLUMPS NOTED ON SMEAR, UNABLE TO ESTIMATE 10/14/2020      Chemistry      Component Value Date/Time   NA 138 10/14/2020 1448   NA 142 05/29/2020 1344   K 4.7 10/14/2020 1448   CL 107 10/14/2020 1448   CO2 20 (L) 10/14/2020 1448   BUN 40 (H) 10/14/2020 1448   BUN 9 05/29/2020 1344   CREATININE 0.96 10/14/2020 1448   CREATININE 0.63 08/08/2020 0000      Component Value Date/Time   CALCIUM 9.1 10/14/2020 1448   ALKPHOS 92 10/14/2020 1448   ALKPHOS 76 03/02/2014 1034   AST 30 10/14/2020 1448   AST 13 (L) 03/02/2014 1034   ALT 20 10/14/2020 1448   ALT 21 03/02/2014 1034   BILITOT 0.8 10/14/2020 1448   BILITOT 0.4 05/29/2020 1344   BILITOT 0.2 03/02/2014 1034       RADIOGRAPHIC STUDIES: I have personally reviewed the radiological images as listed and agreed with the findings in the report. No results found.   ASSESSMENT & PLAN:  Carcinoma of unknown primary ([place]) #Carcinoma of unknown primary-stage IV -possible pancreatic versus left breast/recurrence-currently awaiting ER/PR HER-2/neu status on the chest wall soft tissue specimen.  Currently s/p carbotaxol cycle #1; day-4.   #Again reviewed with the patient/2 daughters-primary is still.  Awaiting ER or PR HER-2/neu status.  Would recommend a PET scan for further evaluation.  Discussed being stage IV cancer patient will likely need indefinite therapies.   #Left breast mass-question recurrent malignancy-we will plan outpatient biopsy.  #Hypotension/generalized weakness-systolic blood pressure 86; second poor p.o. hydration; [patient on 3 blood pressure medications].  No concerns for sepsis clinically.  Plan IV fluids 1 L; if blood pressure improved-we will plan close follow-up/IV fluids.  If not recommend evaluation in the emergency room.  Plan holding-amlodipine; telmisartan; metoprolol-under further  directions.  Written directions given/discussed with the patient's daughters.  #Progressive portal vein thrombosis-likely secondary to underlying malignancy.  Continue Lovenox 1 mg/kg SQ twice daily   # DISPOSITION: Follow up TBD- Dr.B   No orders of the defined types were placed in this encounter.  All questions were answered. The patient knows to call the clinic with any problems, questions or concerns.      Cammie Sickle, MD 10/14/2020 4:51 PM

## 2020-10-14 NOTE — Assessment & Plan Note (Addendum)
#  Carcinoma of unknown primary-stage IV -possible pancreatic versus left breast/recurrence-currently awaiting ER/PR HER-2/neu status on the chest wall soft tissue specimen.  Currently s/p carbotaxol cycle #1; day-4.   #Again reviewed with the patient/2 daughters-primary is still.  Awaiting ER or PR HER-2/neu status.  Would recommend a PET scan for further evaluation.  Discussed being stage IV cancer patient will likely need indefinite therapies.   #Left breast mass-question recurrent malignancy-we will plan outpatient biopsy.  #Hypotension/generalized weakness-systolic blood pressure 86; second poor p.o. hydration; [patient on 3 blood pressure medications].  No concerns for sepsis clinically.  Plan IV fluids 1 L; if blood pressure improved-we will plan close follow-up/IV fluids.  If not recommend evaluation in the emergency room.  Plan holding-amlodipine; telmisartan; metoprolol-under further directions.  Written directions given/discussed with the patient's daughters.  #Progressive portal vein thrombosis-likely secondary to underlying malignancy.  Continue Lovenox 1 mg/kg SQ twice daily   # DISPOSITION: # PET ASAP # Follow up TBD- Dr.B

## 2020-10-15 ENCOUNTER — Inpatient Hospital Stay: Payer: Medicare PPO

## 2020-10-15 ENCOUNTER — Emergency Department: Payer: Medicare PPO

## 2020-10-15 ENCOUNTER — Other Ambulatory Visit: Payer: Self-pay

## 2020-10-15 ENCOUNTER — Telehealth: Payer: Self-pay

## 2020-10-15 ENCOUNTER — Observation Stay: Payer: Self-pay

## 2020-10-15 ENCOUNTER — Telehealth: Payer: Self-pay | Admitting: *Deleted

## 2020-10-15 ENCOUNTER — Observation Stay
Admission: EM | Admit: 2020-10-15 | Discharge: 2020-10-17 | Disposition: A | Discharge status: Home / Self Care | Payer: Medicare PPO | Attending: Internal Medicine | Admitting: Internal Medicine

## 2020-10-15 DIAGNOSIS — Z79899 Other long term (current) drug therapy: Secondary | ICD-10-CM | POA: Diagnosis not present

## 2020-10-15 DIAGNOSIS — D649 Anemia, unspecified: Secondary | ICD-10-CM | POA: Diagnosis not present

## 2020-10-15 DIAGNOSIS — C801 Malignant (primary) neoplasm, unspecified: Secondary | ICD-10-CM | POA: Diagnosis not present

## 2020-10-15 DIAGNOSIS — I10 Essential (primary) hypertension: Secondary | ICD-10-CM

## 2020-10-15 DIAGNOSIS — Z87891 Personal history of nicotine dependence: Secondary | ICD-10-CM | POA: Diagnosis not present

## 2020-10-15 DIAGNOSIS — R0789 Other chest pain: Secondary | ICD-10-CM | POA: Diagnosis present

## 2020-10-15 DIAGNOSIS — I25110 Atherosclerotic heart disease of native coronary artery with unstable angina pectoris: Secondary | ICD-10-CM | POA: Diagnosis not present

## 2020-10-15 DIAGNOSIS — J90 Pleural effusion, not elsewhere classified: Secondary | ICD-10-CM | POA: Diagnosis not present

## 2020-10-15 DIAGNOSIS — Z20822 Contact with and (suspected) exposure to covid-19: Secondary | ICD-10-CM | POA: Diagnosis not present

## 2020-10-15 DIAGNOSIS — R7303 Prediabetes: Secondary | ICD-10-CM | POA: Insufficient documentation

## 2020-10-15 DIAGNOSIS — I81 Portal vein thrombosis: Secondary | ICD-10-CM | POA: Diagnosis not present

## 2020-10-15 DIAGNOSIS — Z9861 Coronary angioplasty status: Secondary | ICD-10-CM | POA: Insufficient documentation

## 2020-10-15 DIAGNOSIS — I200 Unstable angina: Secondary | ICD-10-CM | POA: Diagnosis not present

## 2020-10-15 DIAGNOSIS — F32A Depression, unspecified: Secondary | ICD-10-CM | POA: Diagnosis present

## 2020-10-15 DIAGNOSIS — I214 Non-ST elevation (NSTEMI) myocardial infarction: Secondary | ICD-10-CM | POA: Diagnosis not present

## 2020-10-15 DIAGNOSIS — C799 Secondary malignant neoplasm of unspecified site: Secondary | ICD-10-CM | POA: Diagnosis not present

## 2020-10-15 DIAGNOSIS — Z853 Personal history of malignant neoplasm of breast: Secondary | ICD-10-CM | POA: Insufficient documentation

## 2020-10-15 DIAGNOSIS — I2510 Atherosclerotic heart disease of native coronary artery without angina pectoris: Secondary | ICD-10-CM | POA: Diagnosis present

## 2020-10-15 DIAGNOSIS — R079 Chest pain, unspecified: Secondary | ICD-10-CM | POA: Diagnosis not present

## 2020-10-15 LAB — TROPONIN I (HIGH SENSITIVITY)
Troponin I (High Sensitivity): 431 ng/L (ref <18)
Troponin I (High Sensitivity): 400 ng/L (ref <18)
Troponin I (High Sensitivity): 404 ng/L (ref <18)
Troponin I (High Sensitivity): 458 ng/L (ref <18)

## 2020-10-15 LAB — BASIC METABOLIC PANEL
Anion gap: 9 (ref 5–15)
BUN: 38 mg/dL — ABNORMAL HIGH (ref 8–23)
CO2: 19 mmol/L — ABNORMAL LOW (ref 22–32)
Calcium: 8.9 mg/dL (ref 8.9–10.3)
Chloride: 108 mmol/L (ref 98–111)
Creatinine, Ser: 0.83 mg/dL (ref 0.44–1.00)
GFR, Estimated: >60 mL/min (ref >60)
Glucose, Bld: 123 mg/dL — ABNORMAL HIGH (ref 70–99)
Potassium: 4.8 mmol/L (ref 3.5–5.1)
Sodium: 136 mmol/L (ref 135–145)

## 2020-10-15 LAB — RESP PANEL BY RT-PCR (FLU A&B, COVID) ARPGX2
Influenza A by PCR: NEGATIVE
Influenza B by PCR: NEGATIVE
SARS Coronavirus 2 by RT PCR: NEGATIVE

## 2020-10-15 LAB — CBC
HCT: 31 % — ABNORMAL LOW (ref 36.0–46.0)
Hemoglobin: 9.9 g/dL — ABNORMAL LOW (ref 12.0–15.0)
MCH: 26.1 pg (ref 26.0–34.0)
MCHC: 31.9 g/dL (ref 30.0–36.0)
MCV: 81.6 fL (ref 80.0–100.0)
Platelets: UNDETERMINED 10*3/uL (ref 150–400)
RBC: 3.8 MIL/uL — ABNORMAL LOW (ref 3.87–5.11)
RDW: 16.5 % — ABNORMAL HIGH (ref 11.5–15.5)
WBC: 11.4 10*3/uL — ABNORMAL HIGH (ref 4.0–10.5)
nRBC: 0 % (ref 0.0–0.2)

## 2020-10-15 LAB — LIPASE, BLOOD: Lipase: 39 U/L (ref 11–51)

## 2020-10-15 MED ORDER — OXYCODONE-ACETAMINOPHEN 5-325 MG PO TABS
1.0000 | ORAL_TABLET | Freq: Four times a day (QID) | ORAL | Status: DC | PRN
  Administered 2020-10-16: 1 via ORAL
  Filled 2020-10-15: qty 1

## 2020-10-15 MED ORDER — ASPIRIN EC 81 MG PO TBEC
81.0000 mg | DELAYED_RELEASE_TABLET | Freq: Every day | ORAL | Status: DC
  Administered 2020-10-15 – 2020-10-17 (×3): 81 mg via ORAL
  Filled 2020-10-15 (×3): qty 1

## 2020-10-15 MED ORDER — SODIUM CHLORIDE 0.9 % IV BOLUS
1000.0000 mL | Freq: Once | INTRAVENOUS | Status: AC
  Administered 2020-10-15: 1000 mL via INTRAVENOUS

## 2020-10-15 MED ORDER — OXYCODONE-ACETAMINOPHEN 10-325 MG PO TABS
1.0000 | ORAL_TABLET | Freq: Four times a day (QID) | ORAL | Status: DC | PRN
Start: 2020-10-15 — End: 2020-10-15

## 2020-10-15 MED ORDER — LACTULOSE 10 GM/15ML PO SOLN: 20.0000 g | Freq: Every day | ORAL | Status: DC | PRN

## 2020-10-15 MED ORDER — ROSUVASTATIN CALCIUM 10 MG PO TABS
10.0000 mg | ORAL_TABLET | Freq: Every day | ORAL | Status: DC
  Administered 2020-10-16 – 2020-10-17 (×2): 10 mg via ORAL
  Filled 2020-10-15 (×3): qty 1

## 2020-10-15 MED ORDER — ACETAMINOPHEN 325 MG PO TABS: 650.0000 mg | ORAL_TABLET | ORAL | Status: DC | PRN

## 2020-10-15 MED ORDER — HEPARIN (PORCINE) 25000 UT/250ML-% IV SOLN: 12.0000 [IU]/kg/h | INTRAVENOUS | Status: DC

## 2020-10-15 MED ORDER — ENOXAPARIN SODIUM 80 MG/0.8ML ~~LOC~~ SOLN
75.0000 mg | Freq: Two times a day (BID) | SUBCUTANEOUS | Status: DC
  Administered 2020-10-16 (×2): 75 mg via SUBCUTANEOUS
  Filled 2020-10-15 (×4): qty 0.8

## 2020-10-15 MED ORDER — PAROXETINE HCL 10 MG PO TABS
10.0000 mg | ORAL_TABLET | Freq: Every day | ORAL | Status: DC
Start: 2020-10-16 — End: 2020-10-17
  Administered 2020-10-16 – 2020-10-17 (×2): 10 mg via ORAL
  Filled 2020-10-15 (×2): qty 1

## 2020-10-15 MED ORDER — POLYETHYLENE GLYCOL 3350 17 G PO PACK
17.0000 g | PACK | Freq: Every day | ORAL | Status: DC
  Administered 2020-10-15 – 2020-10-16 (×2): 17 g via ORAL
  Filled 2020-10-15 (×3): qty 1

## 2020-10-15 MED ORDER — HEPARIN (PORCINE) 25000 UT/250ML-% IV SOLN: 850.0000 [IU]/h | INTRAVENOUS | Status: DC

## 2020-10-15 MED ORDER — OXYCODONE HCL 5 MG PO TABS
5.0000 mg | ORAL_TABLET | Freq: Four times a day (QID) | ORAL | Status: DC | PRN
  Administered 2020-10-15 – 2020-10-16 (×2): 5 mg via ORAL
  Filled 2020-10-15 (×2): qty 1

## 2020-10-15 MED ORDER — METOPROLOL SUCCINATE ER 25 MG PO TB24
12.5000 mg | ORAL_TABLET | Freq: Every day | ORAL | Status: DC
  Administered 2020-10-15 – 2020-10-17 (×3): 12.5 mg via ORAL
  Filled 2020-10-15: qty 1
  Filled 2020-10-15: qty 0.5
  Filled 2020-10-15: qty 1
  Filled 2020-10-15: qty 0.5

## 2020-10-15 MED ORDER — ENSURE ENLIVE PO LIQD
237.0000 mL | Freq: Two times a day (BID) | ORAL | Status: DC
  Administered 2020-10-16 – 2020-10-17 (×3): 237 mL via ORAL

## 2020-10-15 MED ORDER — ONDANSETRON HCL 4 MG/2ML IJ SOLN
4.0000 mg | Freq: Four times a day (QID) | INTRAMUSCULAR | Status: DC | PRN
  Administered 2020-10-16: 4 mg via INTRAVENOUS
  Filled 2020-10-15: qty 2

## 2020-10-15 MED ORDER — VITAMIN D 25 MCG (1000 UNIT) PO TABS
1000.0000 [IU] | ORAL_TABLET | Freq: Every day | ORAL | Status: DC
  Administered 2020-10-16 – 2020-10-17 (×2): 1000 [IU] via ORAL
  Filled 2020-10-15 (×2): qty 1

## 2020-10-15 MED ORDER — ENOXAPARIN SODIUM 80 MG/0.8ML ~~LOC~~ SOLN: 80.0000 mg | Freq: Two times a day (BID) | SUBCUTANEOUS | Status: DC

## 2020-10-15 MED ORDER — HEPARIN BOLUS VIA INFUSION
4000.0000 [IU] | Freq: Once | INTRAVENOUS | Status: DC
  Filled 2020-10-15: qty 4000

## 2020-10-15 NOTE — Progress Notes (Deleted)
Chronic Care Management Pharmacy Note  10/15/2020 Name:  Theresa Andrews MRN:  [id number] DOB:  1946/06/21  Subjective: Theresa Andrews is an 75 y.o. year old female who is a primary patient of Steele Sizer, MD.  The CCM team was consulted for assistance with disease management and care coordination needs.    Engaged with patient by telephone for initial visit in response to provider referral for pharmacy case management and/or care coordination services.   Consent to Services:  The patient was given the following information about Chronic Care Management services today, agreed to services, and gave verbal consent: 1. CCM service includes personalized support from designated clinical staff supervised by the primary care provider, including individualized plan of care and coordination with other care providers 2. 24/7 contact phone numbers for assistance for urgent and routine care needs. 3. Service will only be billed when office clinical staff spend 20 minutes or more in a month to coordinate care. 4. Only one practitioner may furnish and bill the service in a calendar month. 5.The patient may stop CCM services at any time (effective at the end of the month) by phone call to the office staff. 6. The patient will be responsible for cost sharing (co-pay) of up to 20% of the service fee (after annual deductible is met). Patient agreed to services and consent obtained.  Patient Care Team: Steele Sizer, MD as PCP - General (Family Medicine) Cammie Sickle, MD as Consulting Physician (Oncology) Corey Skains, MD as Consulting Physician (Cardiology) Solum, Betsey Holiday, MD as Consulting Physician (Endocrinology) Baxter Kail, MD as Consulting Physician (Dermatology) Anell Barr, OD as Consulting Physician (Optometry) Pieter Partridge, MD as Referring Physician (Dermatology) Vern Claude, LCSW as Social Worker Neldon Labella, RN as Registered Nurse Germaine Pomfret, [facility]  (Pharmacist)  Recent office visits: 10/01/20: Patient presented to Dr. Ancil Boozer for hospital follow-up. xarelto stopped and switched to Lovenox due to progression of portal vein thrombosis.  09/04/20: Rosuvastatin increased to 10 mg daily   Recent consult visits: [facility] visits: 3/16-3/19/22: Patient hospitalized for abdominal pain  Objective:  Lab Results  Component Value Date   CREATININE 0.96 10/14/2020   BUN 40 (H) 10/14/2020   GFRNONAA >60 10/14/2020   GFRAA 103 08/08/2020   NA 138 10/14/2020   K 4.7 10/14/2020   CALCIUM 9.1 10/14/2020   CO2 20 (L) 10/14/2020   GLUCOSE 123 (H) 10/14/2020    Lab Results  Component Value Date/Time   HGBA1C 6.1 (H) 08/08/2020 12:00 AM   HGBA1C 6.3 (H) 07/05/2019 12:00 AM   MICROALBUR 1.0 08/09/2020 04:14 PM   MICROALBUR 11.9 11/14/2019 10:05 AM    Last diabetic Eye exam: No results found for: HMDIABEYEEXA  Last diabetic Foot exam: No results found for: HMDIABFOOTEX   Lab Results  Component Value Date   CHOL 130 08/08/2020   HDL 40 (L) 08/08/2020   LDLCALC 76 08/08/2020   TRIG 68 08/08/2020   CHOLHDL 3.3 08/08/2020    Hepatic Function Latest Ref Rng & Units 10/14/2020 10/09/2020 10/06/2020  Total Protein 6.5 - 8.1 g/dL 6.1(L) 6.8 7.0  Albumin 3.5 - 5.0 g/dL 2.7(L) 3.0(L) 3.0(L)  AST 15 - 41 U/L 30 25 28   ALT 0 - 44 U/L 20 13 12   Alk Phosphatase 38 - 126 U/L 92 97 93  Total Bilirubin 0.3 - 1.2 mg/dL 0.8 0.6 0.9  Bilirubin, Direct 0.1 - 0.5 mg/dL - - -    Lab Results  Component Value  Date/Time   TSH 3.59 12/10/2016 10:59 AM   TSH 5.476 (H) 12/02/2016 09:59 AM   FREET4 1.1 12/10/2016 10:59 AM    CBC Latest Ref Rng & Units 10/14/2020 10/12/2020 10/11/2020  WBC 4.0 - 10.5 K/uL 14.2(H) 17.7(H) 9.5  Hemoglobin 12.0 - 15.0 g/dL 10.4(L) 9.6(L) 9.2(L)  Hematocrit 36.0 - 46.0 % 33.6(L) 30.2(L) 29.4(L)  Platelets 150 - 400 K/uL PLATELET CLUMPS NOTED ON SMEAR, UNABLE TO ESTIMATE 128(L) 148(L)    Lab Results  Component Value  Date/Time   VD25OH 36 08/08/2020 12:00 AM   VD25OH 19 (L) 07/05/2019 12:00 AM    Clinical ASCVD: Yes  The 10-year ASCVD risk score Mikey Bussing DC Jr., et al., 2013) is: 6.7%   Values used to calculate the score:     Age: 17 years     Sex: Female     Is Non-Hispanic African American: Yes     Diabetic: No     Tobacco smoker: No     Systolic Blood Pressure: 240 mmHg     Is BP treated: Yes     HDL Cholesterol: 40 mg/dL     Total Cholesterol: 130 mg/dL    Depression screen [facility] 2/9 10/01/2020 09/19/2020 01/05/2020  Decreased Interest 3 0 0  Down, Depressed, Hopeless 3 0 0  PHQ - 2 Score 6 0 0  Altered sleeping 3 - 0  Tired, decreased energy 3 - 0  Change in appetite 3 - 1  Feeling bad or failure about yourself  0 - 0  Trouble concentrating 3 - 0  Moving slowly or fidgety/restless 0 - 0  Suicidal thoughts 0 - 0  PHQ-9 Score 18 - 1  Difficult doing work/chores - - Not difficult at all  Some recent data might be hidden     ***Other: (CHADS2VASc if Afib, MMRC or CAT for COPD, ACT, DEXA)  Social History   Tobacco Use  Smoking Status Former Smoker  . Packs/day: 0.25  . Years: 25.00  . Pack years: 6.25  . Types: Cigarettes  . Quit date: 2008  . Years since quitting: 14.2  Smokeless Tobacco Never Used  Tobacco Comment   smoking cessation materials not required   BP Readings from Last 3 Encounters:  10/14/20 106/80  10/14/20 (!) 85/61  10/12/20 105/76   Pulse Readings from Last 3 Encounters:  10/14/20 85  10/14/20 92  10/12/20 (!) 110   Wt Readings from Last 3 Encounters:  10/14/20 156 lb (70.8 kg)  10/09/20 165 lb (74.8 kg)  10/06/20 165 lb (74.8 kg)   BMI Readings from Last 3 Encounters:  10/14/20 26.78 kg/m  10/09/20 28.32 kg/m  10/06/20 28.32 kg/m    Assessment/Interventions: Review of patient past medical history, allergies, medications, health status, including review of consultants reports, laboratory and other test data, was performed as part of comprehensive  evaluation and provision of chronic care management services.   SDOH:  (Social Determinants of Health) assessments and interventions performed: Yes   CCM Care Plan  Allergies  Allergen Reactions  . Tetanus Toxoids Shortness Of Breath and Rash  . Shellfish Allergy     Medications Reviewed Today    Reviewed by Lorrin Jackson, CMA (Certified Medical Assistant) on 10/14/20 at [place]  Med List Status: <None>  Medication Order Taking? Sig Documenting Provider Last Dose Status Informant  amLODipine (NORVASC) 10 MG tablet [id number] Yes Take 1 tablet (10 mg total) by mouth daily. Steele Sizer, MD Taking Active Self  cholecalciferol (VITAMIN D) 1000 units tablet  [id number] Yes Take 1,000 Units by mouth daily. [provider] Taking Active Self  enoxaparin (LOVENOX) 80 MG/0.8ML injection [id number] Yes Inject into the skin. [provider] Taking Active Self  feeding supplement (ENSURE ENLIVE / ENSURE PLUS) LIQD [id number] Yes Take 237 mLs by mouth 2 (two) times daily between meals. Ezekiel Slocumb, DO Taking Active   Lactulose 20 GM/30ML SOLN [id number] Yes Take 30 mLs (20 g total) by mouth daily as needed. Sharen Hones, MD Taking Active Self  metoprolol succinate (TOPROL-XL) 25 MG 24 hr tablet [id number] Yes Take 0.5 tablets (12.5 mg total) by mouth daily. Steele Sizer, MD Taking Active Self  ondansetron [facility]) 4 MG tablet [id number] Yes Take 1 tablet (4 mg total) by mouth daily as needed for nausea or vomiting. Sharen Hones, MD Taking Active Self  oxyCODONE-acetaminophen (PERCOCET) 10-325 MG tablet [id number] Yes Take 1 tablet by mouth every 6 (six) hours as needed for pain. Sharen Hones, MD Taking Active Self  PARoxetine (PAXIL) 10 MG tablet [id number] Yes Take 1 tablet (10 mg total) by mouth daily. Steele Sizer, MD Taking Active Self  polyethylene glycol (MIRALAX / GLYCOLAX) 17 g packet [id number] Yes Take 17 g by mouth 2 (two) times daily as needed. Sharen Hones, MD Taking  Active Self  potassium chloride SA (KLOR-CON) 20 MEQ tablet [id number] Yes 1 pill twice a day Cammie Sickle, MD Taking Active Self  rosuvastatin (CRESTOR) 10 MG tablet [id number] Yes Take 1 tablet (10 mg total) by mouth daily. Steele Sizer, MD Taking Active Self  telmisartan (MICARDIS) 80 MG tablet [id number] Yes One daily Steele Sizer, MD Taking Active Self          Patient Active Problem List   Diagnosis Date Noted  . Acute metabolic encephalopathy 61/95/0932  . Palliative care encounter   . Malignant ascites 10/09/2020  . Carcinoma of unknown primary ([place]) 10/09/2020  . Portal vein thrombosis   . Abdominal pain 10/03/2020  . Lesion of liver 03/11/2020  . Bradycardia 10/04/2018  . Obesity (BMI 30-39.9) 05/12/2018  . Statin myopathy 05/12/2018  . Vitamin D deficiency 01/09/2018  . Hyperparathyroidism ([place]) 01/07/2018  . Carcinoma of overlapping sites of left breast in female, estrogen receptor positive ([place]) 03/27/2016  . Hypercalcemia 08/20/2015  . Diuretic-induced hypokalemia 04/29/2015  . Abnormal finding on thyroid function test 10/29/2014  . Anxiety disorder 10/29/2014  . Breast CA ([place]) 10/29/2014  . Atherosclerosis of coronary artery 10/29/2014  . Prediabetes 10/29/2014  . Gravida 1 10/29/2014  . Hyperlipidemia 10/29/2014  . Disorder of peripheral nervous system 10/29/2014  . At risk for falling 10/29/2014  . Neoplasm of skin 10/29/2014  . Benign essential HTN 04/25/2014    Immunization History  Administered Date(s) Administered  . Influenza, High Dose Seasonal PF 04/29/2015, 05/08/2016, 04/06/2017, 05/02/2018, 04/14/2019  . Influenza-Unspecified 05/09/2014, 05/02/2018, 04/19/2020  . PFIZER(Purple Top)SARS-COV-2 Vaccination 09/20/2019, 10/11/2019, 06/24/2020  . Pneumococcal Conjugate-13 05/09/2014  . Pneumococcal Polysaccharide-23 05/08/2016  . Zoster 07/27/2012    Conditions to be addressed/monitored:  Hypertension, Hyperlipidemia, Coronary Artery  Disease and Anxiety  There are no care plans that you recently modified to display for this patient.    Medication Assistance: {MEDASSISTANCEINFO:25044}  Patient's preferred pharmacy is:  [facility] [address]), [place] ([place]) [place] [address] Phone: [phone] Fax: [phone]  Uses pill box? {Yes or If no, why not?:20788} Pt endorses ***% compliance  We discussed: {Pharmacy options:24294} Patient decided to: {US Pharmacy LPFX:90240}  Care Plan and Follow Up Patient Decision:  {FOLLOWUP:24991}  Plan: {CM FOLLOW UP PLAN:25073}  ***   Current Barriers:  . {pharmacybarriers:24917} . ***  Pharmacist Clinical Goal(s):  Marland Kitchen Patient will {PHARMACYGOALCHOICES:24921} through collaboration with PharmD and provider.  . ***  Interventions: . 1:1 collaboration with Steele Sizer, MD regarding development and update of comprehensive plan of care as evidenced by provider attestation and co-signature . Inter-disciplinary care team collaboration (see longitudinal plan of care) . Comprehensive medication review performed; medication list updated in electronic medical record  Hypertension (BP goal {CHL HP UPSTREAM Pharmacist BP ranges:[phone]}) -{US controlled/uncontrolled:25276} -Current treatment: . Amlodipine 10 mg daily  . Metoprolol XL 25 mg 1/2 tablet daily  . Telmisartan 80 mg daily -Medications previously tried: ***  -Current home readings: *** -Current dietary habits: *** -Current exercise habits: *** -{ACTIONS;DENIES/REPORTS:21021675::"Denies"} hypotensive/hypertensive symptoms -Educated on {CCM BP Counseling:25124} -Counseled to monitor BP at home ***, document, and provide log at future appointments -{CCMPHARMDINTERVENTION:25122}  Hyperlipidemia: (LDL goal < ***) -{US controlled/uncontrolled:25276} -Current treatment: . Rosuvastatin 10 mg daily  -Medications previously tried: ***   -Current dietary patterns: *** -Current exercise habits: *** -Educated on {CCM HLD Counseling:25126} -{CCMPHARMDINTERVENTION:25122}  Depression/Anxiety (Goal: ***) -{US controlled/uncontrolled:25276} -Current treatment: . Paroxetine 10 mg daily  -Medications previously tried/failed: *** -PHQ9: *** -GAD7: *** -Connected with *** for mental health support -Educated on {CCM mental health counseling:25127} -{CCMPHARMDINTERVENTION:25122}   Patient Goals/Self-Care Activities . Patient will:  - {pharmacypatientgoals:24919}  Follow Up Plan: {CM FOLLOW UP EFEO:71219}

## 2020-10-15 NOTE — Progress Notes (Signed)
° ° °  Chronic Care Management Pharmacy Assistant   Name: Theresa Andrews  MRN: [id number] DOB: 05/23/1946  Reason for Encounter: Medication Review/Initial questions for initial visit on 10/16/2020 with clinical pharmacist.   Left Voice message to do initial question prior to patient appointment on 10/16/2020  for CCM at 11:00 am  with Junius Argyle the Clinical pharmacist.    Please bring medications and supplements to appointment   Medications: Outpatient Encounter Medications as of 10/15/2020  Medication Sig   amLODipine (NORVASC) 10 MG tablet Take 1 tablet (10 mg total) by mouth daily.   cholecalciferol (VITAMIN D) 1000 units tablet Take 1,000 Units by mouth daily.   enoxaparin (LOVENOX) 80 MG/0.8ML injection Inject into the skin.   feeding supplement (ENSURE ENLIVE / ENSURE PLUS) LIQD Take 237 mLs by mouth 2 (two) times daily between meals.   Lactulose 20 GM/30ML SOLN Take 30 mLs (20 g total) by mouth daily as needed.   metoprolol succinate (TOPROL-XL) 25 MG 24 hr tablet Take 0.5 tablets (12.5 mg total) by mouth daily.   ondansetron (ZOFRAN) 4 MG tablet Take 1 tablet (4 mg total) by mouth daily as needed for nausea or vomiting.   oxyCODONE-acetaminophen (PERCOCET) 10-325 MG tablet Take 1 tablet by mouth every 6 (six) hours as needed for pain.   PARoxetine (PAXIL) 10 MG tablet Take 1 tablet (10 mg total) by mouth daily.   polyethylene glycol (MIRALAX / GLYCOLAX) 17 g packet Take 17 g by mouth 2 (two) times daily as needed.   potassium chloride SA (KLOR-CON) 20 MEQ tablet 1 pill twice a day   rosuvastatin (CRESTOR) 10 MG tablet Take 1 tablet (10 mg total) by mouth daily.   telmisartan (MICARDIS) 80 MG tablet One daily   No facility-administered encounter medications on file as of 10/15/2020.    Star Rating Drugs:rosuvastatin   [facility] Pharmacist Assistant [phone]

## 2020-10-15 NOTE — Telephone Encounter (Signed)
[facility]. She c/o severe chest pain. "feels like an elephant on my chest." transported patient via w/c to ER for evaluation of chest pain.

## 2020-10-15 NOTE — ED Notes (Signed)
Pt waiting on med reconciliation from pharmacy.  No meds to give at this time.

## 2020-10-15 NOTE — ED Notes (Signed)
Iv infiltrated.  Pt tolerated well.  Family with pt.  Per iv team nurse, picc line to be inserted tomorrow   No one here tonight that can start picc line

## 2020-10-15 NOTE — ED Notes (Signed)
Family with pt.  Pt waiting on picc line access.  Pt and family aware.

## 2020-10-15 NOTE — ED Notes (Signed)
Pt alert  Family with pt

## 2020-10-15 NOTE — ED Provider Notes (Signed)
[facility] Emergency Department Provider Note   ____________________________________________   Event Date/Time   First MD Initiated Contact with Patient 10/15/20 1150     (approximate)  I have reviewed the triage vital signs and the nursing notes.   HISTORY  Chief Complaint Chest Pain    HPI Theresa Andrews is a 75 y.o. female with a stated past medical history of metastatic adenocarcinoma from a presumed pancreatic source who presents for chest pain that she describes as 5/10, left anterior chest wall that does not have any exacerbating or relieving factors.  Currently on chemotherapy with first dose last week.  Patient denies any associated shortness of breath.  Patient denies any dyspnea on exertion.  Patient currently denies any vision changes, tinnitus, difficulty speaking, facial droop, sore throat, shortness of breath, abdominal pain, nausea/vomiting/diarrhea, dysuria, or weakness/numbness/paresthesias in any extremity         Past Medical History:  Diagnosis Date  . Adenocarcinoma of unknown primary ([place])   . Anxiety   . Breast cancer ([place])    pT2 pN0 cM0 (stage IIA) invasive mammary carcinoma of left outer breast status post lumpectomy and sentinel node study on July 14, 2010  . CAD (coronary artery disease)   . Depression   . H/O hypokalemia   . History of chemotherapy   . History of MI (myocardial infarction)   . Hyperglycemia   . Hyperlipidemia   . Hypertension   . Peripheral neuropathy due to chemotherapy ([place])   . Personal history of chemotherapy 2011   left breast ca  . Personal history of radiation therapy 2001   left breast ca    Patient Active Problem List   Diagnosis Date Noted  . Acute metabolic encephalopathy 04/59/9774  . Palliative care encounter   . Malignant ascites 10/09/2020  . Carcinoma of unknown primary ([place]) 10/09/2020  . Portal vein thrombosis   . Abdominal pain 10/03/2020  . Lesion of liver  03/11/2020  . Bradycardia 10/04/2018  . Obesity (BMI 30-39.9) 05/12/2018  . Statin myopathy 05/12/2018  . Vitamin D deficiency 01/09/2018  . Hyperparathyroidism ([place]) 01/07/2018  . Carcinoma of overlapping sites of left breast in female, estrogen receptor positive ([place]) 03/27/2016  . Hypercalcemia 08/20/2015  . Diuretic-induced hypokalemia 04/29/2015  . Abnormal finding on thyroid function test 10/29/2014  . Anxiety disorder 10/29/2014  . Breast CA ([place]) 10/29/2014  . Atherosclerosis of coronary artery 10/29/2014  . Prediabetes 10/29/2014  . Gravida 1 10/29/2014  . Hyperlipidemia 10/29/2014  . Disorder of peripheral nervous system 10/29/2014  . At risk for falling 10/29/2014  . Neoplasm of skin 10/29/2014  . Benign essential HTN 04/25/2014    Past Surgical History:  Procedure Laterality Date  . ABDOMINAL HYSTERECTOMY    . BREAST BIOPSY Left 2008   neg  . BREAST BIOPSY Left 03/05/2010   invasive mammary carcinoma  . BREAST LUMPECTOMY Left 07/14/2010   INVASIVE MAMMARY CARCINOMA WITH PROMINENT INFILTRATIVE PATTERN, negative margins  . CHOLECYSTECTOMY    . COLONOSCOPY WITH PROPOFOL N/A 11/22/2015   Procedure: COLONOSCOPY WITH PROPOFOL;  Surgeon: Hulen Luster, MD;  Location: [facility] ENDOSCOPY;  Service: Gastroenterology;  Laterality: N/A;  . CORONARY ANGIOPLASTY WITH STENT PLACEMENT  2008    Prior to Admission medications   Medication Sig Start Date End Date Taking? Authorizing Provider  amLODipine (NORVASC) 10 MG tablet Take 1 tablet (10 mg total) by mouth daily. 07/29/20   Steele Sizer, MD  cholecalciferol (VITAMIN D) 1000 units tablet Take 1,000  Units by mouth daily.    [provider]  enoxaparin (LOVENOX) 80 MG/0.8ML injection Inject into the skin. 10/04/20   [provider]  feeding supplement (ENSURE ENLIVE / ENSURE PLUS) LIQD Take 237 mLs by mouth 2 (two) times daily between meals. 10/13/20   Ezekiel Slocumb, DO  Lactulose 20 GM/30ML SOLN Take 30 mLs (20 g  total) by mouth daily as needed. 10/04/20   Sharen Hones, MD  metoprolol succinate (TOPROL-XL) 25 MG 24 hr tablet Take 0.5 tablets (12.5 mg total) by mouth daily. 07/29/20   Steele Sizer, MD  ondansetron (ZOFRAN) 4 MG tablet Take 1 tablet (4 mg total) by mouth daily as needed for nausea or vomiting. 10/04/20 10/04/21  Sharen Hones, MD  oxyCODONE-acetaminophen (PERCOCET) 10-325 MG tablet Take 1 tablet by mouth every 6 (six) hours as needed for pain. 10/04/20   Sharen Hones, MD  PARoxetine (PAXIL) 10 MG tablet Take 1 tablet (10 mg total) by mouth daily. 09/13/20   Steele Sizer, MD  polyethylene glycol (MIRALAX / GLYCOLAX) 17 g packet Take 17 g by mouth 2 (two) times daily as needed. 10/04/20   Sharen Hones, MD  potassium chloride SA (KLOR-CON) 20 MEQ tablet 1 pill twice a day 08/08/20   Cammie Sickle, MD  rosuvastatin (CRESTOR) 10 MG tablet Take 1 tablet (10 mg total) by mouth daily. 09/04/20   Steele Sizer, MD  telmisartan (MICARDIS) 80 MG tablet One daily 07/29/20   Steele Sizer, MD    Allergies Tetanus toxoids and Shellfish allergy  Family History  Problem Relation Age of Onset  . Breast cancer Sister 29  . Hypertension Mother   . Aneurysm Mother   . Heart attack Sister   . Birth defects Sister     Social History Social History   Tobacco Use  . Smoking status: Former Smoker    Packs/day: 0.25    Years: 25.00    Pack years: 6.25    Types: Cigarettes    Quit date: 2008    Years since quitting: 14.2  . Smokeless tobacco: Never Used  . Tobacco comment: smoking cessation materials not required  Vaping Use  . Vaping Use: Never used  Substance Use Topics  . Alcohol use: No    Comment: rare; holidays  . Drug use: No    Review of Systems Constitutional: No fever/chills Eyes: No visual changes. ENT: No sore throat. Cardiovascular: Endorses chest pain. Respiratory: Denies shortness of breath. Gastrointestinal: No abdominal pain.  No nausea, no vomiting.  No  diarrhea. Genitourinary: Negative for dysuria. Musculoskeletal: Negative for acute arthralgias Skin: Negative for rash. Neurological: Negative for headaches, weakness/numbness/paresthesias in any extremity Psychiatric: Negative for suicidal ideation/homicidal ideation   ____________________________________________   PHYSICAL EXAM:  VITAL SIGNS: ED Triage Vitals  Enc Vitals Group     BP 10/15/20 1030 (!) 118/101     Pulse Rate 10/15/20 1030 98     Resp 10/15/20 1030 18     Temp 10/15/20 1030 97.9 F (36.6 C)     Temp Source 10/15/20 1030 Oral     SpO2 10/15/20 1030 99 %     Weight 10/15/20 1029 165 lb (74.8 kg)     Height 10/15/20 1029 5\' 4"  (1.626 m)     Head Circumference --      Peak Flow --      Pain Score 10/15/20 1028 8     Pain Loc --      Pain Edu? --  Excl. in [place]? --    Constitutional: Alert and oriented. Well appearing and in no acute distress. Eyes: Conjunctivae are normal. PERRL. Head: Atraumatic. Nose: No congestion/rhinnorhea. Mouth/Throat: Mucous membranes are moist. Neck: No stridor Cardiovascular: Grossly normal heart sounds.  Good peripheral circulation. Respiratory: Normal respiratory effort.  No retractions. Gastrointestinal: Soft and nontender. No distention. Musculoskeletal: No obvious deformities Neurologic:  Normal speech and language. No gross focal neurologic deficits are appreciated. Skin:  Skin is warm and dry. No rash noted. Psychiatric: Mood and affect are normal. Speech and behavior are normal.  ____________________________________________   LABS (all labs ordered are listed, but only abnormal results are displayed)  Labs Reviewed  BASIC METABOLIC PANEL - Abnormal; Notable for the following components:      Result Value   CO2 19 (*)    Glucose, Bld 123 (*)    BUN 38 (*)    All other components within normal limits  CBC - Abnormal; Notable for the following components:   WBC 11.4 (*)    RBC 3.80 (*)    Hemoglobin 9.9 (*)     HCT 31.0 (*)    RDW 16.5 (*)    All other components within normal limits  TROPONIN I (HIGH SENSITIVITY) - Abnormal; Notable for the following components:   Troponin I (High Sensitivity) 431 (*)    All other components within normal limits  RESP PANEL BY RT-PCR (FLU A&B, COVID) ARPGX2  LIPASE, BLOOD  TROPONIN I (HIGH SENSITIVITY)   ____________________________________________  EKG  ED ECG REPORT I, Naaman Plummer, the attending physician, personally viewed and interpreted this ECG.  Date: 10/15/2020 EKG Time: 1036 Rate: 98 Rhythm: normal sinus rhythm QRS Axis: normal Intervals: normal ST/T Wave abnormalities: normal Narrative Interpretation: no evidence of acute ischemia  ____________________________________________  RADIOLOGY  ED MD interpretation: 2 view x-ray of the chest shows small right pleural effusion slightly increased from previous study  Official radiology report(s): DG Chest 2 View  Result Date: 10/15/2020 CLINICAL DATA:  Chest pain for 30 minutes, upper chest pain. EXAM: CHEST - 2 VIEW COMPARISON:  October 09, 2020 FINDINGS: Trachea midline. Low lung volumes. Cardiomediastinal contours are stable and accentuated by low depth of expansion. Calcified atheromatous plaque in the thoracic aorta. Basilar opacities. Increased basilar opacification on the RIGHT as compared to the previous study. Persistent linear opacity in the RIGHT mid chest. Effusion seen on lateral radiograph with blunting of RIGHT costodiaphragmatic sulcus. Likely increased slightly since the prior study. On limited assessment no acute skeletal process. IMPRESSION: Small RIGHT effusion slightly increased with increased basilar opacity at the RIGHT lung base may reflect developing infection or worsening volume loss in the setting of diminished lung volumes. Electronically Signed   By: Zetta Bills M.D.   On: 10/15/2020 12:44     ____________________________________________   PROCEDURES  Procedure(s) performed (including Critical Care):  .Critical Care Performed by: Naaman Plummer, MD Authorized by: Naaman Plummer, MD   Critical care provider statement:    Critical care time (minutes):  41   Critical care time was exclusive of:  Separately billable procedures and treating other patients   Critical care was necessary to treat or prevent imminent or life-threatening deterioration of the following conditions:  Cardiac failure   Critical care was time spent personally by me on the following activities:  Discussions with consultants, evaluation of patient's response to treatment, examination of patient, ordering and performing treatments and interventions, ordering and review of laboratory studies, ordering and review of  radiographic studies, pulse oximetry, re-evaluation of patient's condition, obtaining history from patient or surrogate and review of old charts   I assumed direction of critical care for this patient from another provider in my specialty: no     Care discussed with: admitting provider   .1-3 Lead EKG Interpretation Performed by: Naaman Plummer, MD Authorized by: Naaman Plummer, MD     Interpretation: normal     ECG rate:  95   ECG rate assessment: normal     Rhythm: sinus rhythm     Ectopy: none     Conduction: normal       ____________________________________________   INITIAL IMPRESSION / ASSESSMENT AND PLAN / ED COURSE  As part of my medical decision making, I reviewed the following data within the [place] notes reviewed and incorporated, Labs reviewed, EKG interpreted, Old chart reviewed, Radiograph reviewed and Notes from prior ED visits reviewed and incorporated        Workup: ECG, CXR, CBC, CMP, Troponin Findings: ECG: No overt evidence of STEMI. No evidence of Brugadas sign, delta wave, epsilon wave, significantly prolonged QTc, or  malignant arrhythmia Troponin: 341 Other Labs unremarkable for emergent problems. CXR: Without PTX, PNA, or widened mediastinum Last Stress Test: Never Last Heart Catheterization: Never HEART Score: 5  Given History, Exam, and Workup concern for NSTEMI.  I have low suspicion for Pneumothorax, Pneumonia, Pulmonary Embolus, Tamponade, Aortic Dissection  Interventions: ASA 324or325mg  Heparin Bolus 60-70u/kg (max 5000) Heparin gtt about 12-15u/kg/hr (max 1000/hr) PRN analgesia with fentanyl, morphine PRN antiemetic therapy  Dispo: Admit      ____________________________________________   FINAL CLINICAL IMPRESSION(S) / ED DIAGNOSES  Final diagnoses:  Unstable angina pectoris (HCC)  NSTEMI (non-ST elevated myocardial infarction) [facility])     ED Discharge Orders    None       Note:  This document was prepared using Dragon voice recognition software and may include unintentional dictation errors.   Naaman Plummer, MD 10/15/20 1250

## 2020-10-15 NOTE — Progress Notes (Signed)
[place]  [phone] [phone]   Name: Theresa Andrews Date: 10/15/2020 MRN: [id number]  DOB: 1945/09/28  Patient Care Team: Steele Sizer, MD as PCP - General (Family Medicine) Cammie Sickle, MD as Consulting Physician (Oncology) Corey Skains, MD as Consulting Physician (Cardiology) Gabriel Carina Betsey Holiday, MD as Consulting Physician (Endocrinology) Baxter Kail, MD as Consulting Physician (Dermatology) Anell Barr, OD as Consulting Physician (Optometry) Pieter Partridge, MD as Referring Physician (Dermatology) Vern Claude, LCSW as Social Worker Neldon Labella, RN as Registered Nurse Michaelle Birks, Glean Salvo, [facility] (Pharmacist)    REASON FOR CONSULTATION: Theresa Andrews is a 75 y.o. female with multiple medical problems including history of left-sided breast cancer status post lumpectomy/XRT/chemotherapy, history of parathyroidism, hypertension, anxiety, and hyperlipidemia. She was hospitalized 10/03/2020-10/04/2020 with abd pain and was again readmitted 10/09/2020-10/12/2020 with persistent abdominal pain and altered mental status. CT of the abdomen and pelvis revealed new progressive metastatic disease involving the pancreas.  She underwent biopsy with pathology showing poorly differentiated carcinoma of unknown primary.  Palliative care was consulted help address goals..   SOCIAL HISTORY:     reports that she quit smoking about 14 years ago. Her smoking use included cigarettes. She has a 6.25 pack-year smoking history. She has never used smokeless tobacco. She reports that she does not drink alcohol and does not use drugs.  Patient is divorced.  She lives at home alone.  She has a daughter who lives next door.  She has 2 other daughters who live nearby.  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  ADVANCE DIRECTIVES:  None on file  CODE STATUS: DNR (DNR order  signed on 10/11/20)  PAST MEDICAL HISTORY: Past Medical History:  Diagnosis Date  . Adenocarcinoma of unknown primary ([place])   . Anxiety   . Breast cancer ([place])    pT2 pN0 cM0 (stage IIA) invasive mammary carcinoma of left outer breast status post lumpectomy and sentinel node study on July 14, 2010  . CAD (coronary artery disease)   . Depression   . H/O hypokalemia   . History of chemotherapy   . History of MI (myocardial infarction)   . Hyperglycemia   . Hyperlipidemia   . Hypertension   . Peripheral neuropathy due to chemotherapy ([place])   . Personal history of chemotherapy 2011   left breast ca  . Personal history of radiation therapy 2001   left breast ca    PAST SURGICAL HISTORY:  Past Surgical History:  Procedure Laterality Date  . ABDOMINAL HYSTERECTOMY    . BREAST BIOPSY Left 2008   neg  . BREAST BIOPSY Left 03/05/2010   invasive mammary carcinoma  . BREAST LUMPECTOMY Left 07/14/2010   INVASIVE MAMMARY CARCINOMA WITH PROMINENT INFILTRATIVE PATTERN, negative margins  . CHOLECYSTECTOMY    . COLONOSCOPY WITH PROPOFOL N/A 11/22/2015   Procedure: COLONOSCOPY WITH PROPOFOL;  Surgeon: Hulen Luster, MD;  Location: [facility] ENDOSCOPY;  Service: Gastroenterology;  Laterality: N/A;  . CORONARY ANGIOPLASTY WITH STENT PLACEMENT  2008    HEMATOLOGY/ONCOLOGY HISTORY:  Oncology History Overview Note  # AUG 2011- LEFT BREAST CA Stage II ER/PR- Pos; her 2 NEG; s/p neo-adj ACx4- poor response; s/p lumpec [Dr.Arru]- ypT2 [2.5cm]ypsN-0/2; Taxol weekly 9/12 [sec to PN]; finished march 2012. Arimidex-May 2012; Aug 2013- changed to Aug 2013; sep 2013- Started Tam; Mammo- in April 2017.   # Declines extended anti-hormone therapy; # Feb 2017- Bone scan-  Negative [hypercalcemia] ;   # AUG 2021- liver Biopsy- LIVER; BIOPSY:[[facility] II OPINION] - LIVER PARENCHYMA WITH ECTATIC PORTAL VEINS WITH HERNIATION AND FOCI SUGGESTIVE OF ORGANIZING THROMBI, PERIDUCTAL FIBROSIS WITH CHRONIC  LYMPHOPLASMACYTIC  INFILTRATE, HEMOSIDERIN DEPOSITION AND PERIPORTAL  REGENERATIVE HYPERPLASIA, SUGGESTIVE OF CHANGES SECONDARY TO PORTAL VEIN  THROMBOSIS (CLINICAL). - FEATURES OF OBSTRUCTIVE BILIARY CHANGES. - NO EVIDENCE OF MALIGNANCY IS SEEN.   # Hypercalcemia- chronic [2016]; primary hyperparathyrdoisim; improved after stopping HCTZ.    # PN- G-1  DIAGNOSIS: breast cancer  STAGE:  II       ;GOALS: cure  CURRENT/MOST RECENT THERAPY: surviellance    Breast CA ([place])  10/29/2014 Initial Diagnosis   Breast CA ([place])   Carcinoma of overlapping sites of left breast in female, estrogen receptor positive ([place])    ALLERGIES:  is allergic to tetanus toxoids and shellfish allergy.  MEDICATIONS:  Current Outpatient Medications  Medication Sig Dispense Refill  . amLODipine (NORVASC) 10 MG tablet Take 1 tablet (10 mg total) by mouth daily. 90 tablet 1  . cholecalciferol (VITAMIN D) 1000 units tablet Take 1,000 Units by mouth daily.    Marland Kitchen enoxaparin (LOVENOX) 80 MG/0.8ML injection Inject into the skin.    . feeding supplement (ENSURE ENLIVE / ENSURE PLUS) LIQD Take 237 mLs by mouth 2 (two) times daily between meals. 237 mL 12  . Lactulose 20 GM/30ML SOLN Take 30 mLs (20 g total) by mouth daily as needed. 450 mL 0  . metoprolol succinate (TOPROL-XL) 25 MG 24 hr tablet Take 0.5 tablets (12.5 mg total) by mouth daily. 45 tablet 3  . ondansetron (ZOFRAN) 4 MG tablet Take 1 tablet (4 mg total) by mouth daily as needed for nausea or vomiting. 12 tablet 0  . oxyCODONE-acetaminophen (PERCOCET) 10-325 MG tablet Take 1 tablet by mouth every 6 (six) hours as needed for pain. 12 tablet 0  . PARoxetine (PAXIL) 10 MG tablet Take 1 tablet (10 mg total) by mouth daily. 30 tablet 0  . polyethylene glycol (MIRALAX / GLYCOLAX) 17 g packet Take 17 g by mouth 2 (two) times daily as needed. 14 each 0  . potassium chloride SA (KLOR-CON) 20 MEQ tablet 1 pill twice a day 60 tablet 3  . rosuvastatin (CRESTOR) 10 MG tablet Take 1 tablet  (10 mg total) by mouth daily. 90 tablet 1  . telmisartan (MICARDIS) 80 MG tablet One daily 90 tablet 1   No current facility-administered medications for this visit.    VITAL SIGNS: There were no vitals taken for this visit. There were no vitals filed for this visit.  Estimated body mass index is 26.78 kg/m as calculated from the following:   Height as of an earlier encounter on 10/14/20: 5\' 4"  (1.626 m).   Weight as of an earlier encounter on 10/14/20: 156 lb (70.8 kg).  LABS: CBC:    Component Value Date/Time   WBC 14.2 (H) 10/14/2020 1448   HGB 10.4 (L) 10/14/2020 1448   HGB 13.2 03/02/2014 1034   HCT 33.6 (L) 10/14/2020 1448   HCT 39.7 03/02/2014 1034   PLT PLATELET CLUMPS NOTED ON SMEAR, UNABLE TO ESTIMATE 10/14/2020 1448   PLT 269 03/02/2014 1034   MCV 84.4 10/14/2020 1448   MCV 94 03/02/2014 1034   NEUTROABS 13.1 (H) 10/14/2020 1448   NEUTROABS 4.3 03/02/2014 1034   LYMPHSABS 0.6 (L) 10/14/2020 1448   LYMPHSABS 2.4 03/02/2014 1034   MONOABS 0.2 10/14/2020 1448   MONOABS 0.5 03/02/2014 1034   EOSABS 0.2  10/14/2020 1448   EOSABS 0.2 03/02/2014 1034   BASOSABS 0.0 10/14/2020 1448   BASOSABS 0.1 03/02/2014 1034   Comprehensive Metabolic Panel:    Component Value Date/Time   NA 138 10/14/2020 1448   NA 142 05/29/2020 1344   K 4.7 10/14/2020 1448   CL 107 10/14/2020 1448   CO2 20 (L) 10/14/2020 1448   BUN 40 (H) 10/14/2020 1448   BUN 9 05/29/2020 1344   CREATININE 0.96 10/14/2020 1448   CREATININE 0.63 08/08/2020 0000   GLUCOSE 123 (H) 10/14/2020 1448   GLUCOSE 134 (H) 03/01/2013 1414   CALCIUM 9.1 10/14/2020 1448   AST 30 10/14/2020 1448   AST 13 (L) 03/02/2014 1034   ALT 20 10/14/2020 1448   ALT 21 03/02/2014 1034   ALKPHOS 92 10/14/2020 1448   ALKPHOS 76 03/02/2014 1034   BILITOT 0.8 10/14/2020 1448   BILITOT 0.4 05/29/2020 1344   BILITOT 0.2 03/02/2014 1034   PROT 6.1 (L) 10/14/2020 1448   PROT 6.6 05/29/2020 1344   PROT 7.5 03/02/2014 1034    ALBUMIN 2.7 (L) 10/14/2020 1448   ALBUMIN 3.9 05/29/2020 1344   ALBUMIN 3.5 03/02/2014 1034    RADIOGRAPHIC STUDIES: DG Chest 2 View  Result Date: 10/04/2020 CLINICAL DATA:  Status post right chest wall mass biopsy EXAM: CHEST - 2 VIEW COMPARISON:  Chest radiograph March 26, 2010 FINDINGS: The heart size and mediastinal contours are within normal limits. Calcifications aortic arch. No visible pneumothorax. Right lower lobe airspace opacity. Small right pleural effusion. No visible pneumothorax. Thoracic spondylosis. IMPRESSION: Right lower lobe atelectasis with right pleural effusion. No visible pneumothorax. Electronically Signed   By: Dahlia Bailiff MD   On: 10/04/2020 12:36   CT Head Wo Contrast  Result Date: 10/03/2020 CLINICAL DATA:  Headache, intracranial hemorrhage suspected EXAM: CT HEAD WITHOUT CONTRAST TECHNIQUE: Contiguous axial images were obtained from the base of the skull through the vertex without intravenous contrast. COMPARISON:  10/01/2020 FINDINGS: Brain: No evidence of acute infarction, hemorrhage, hydrocephalus, extra-axial collection or mass lesion/mass effect. Mild periventricular and deep white matter hypodensity. Vascular: No hyperdense vessel or unexpected calcification. Skull: Normal. Negative for fracture or focal lesion. Sinuses/Orbits: No acute finding. Other: None. IMPRESSION: 1. No acute intracranial pathology. No non-contrast CT findings to explain headache. Specifically, no evidence of intracranial hemorrhage. 2.  Small-vessel white matter disease. Electronically Signed   By: Eddie Candle M.D.   On: 10/03/2020 12:06   CT Head Wo Contrast  Result Date: 10/01/2020 CLINICAL DATA:  Altered mental status, unsteady gait EXAM: CT HEAD WITHOUT CONTRAST TECHNIQUE: Contiguous axial images were obtained from the base of the skull through the vertex without intravenous contrast. COMPARISON:  None. FINDINGS: Brain: No evidence of acute infarction, hemorrhage, hydrocephalus,  extra-axial collection or mass lesion/mass effect. Scattered low-density changes within the periventricular and subcortical white matter compatible with chronic microvascular ischemic change. Mild diffuse cerebral volume loss. Vascular: Atherosclerotic calcifications involving the large vessels of the skull base. No unexpected hyperdense vessel. Skull: Normal. Negative for fracture or focal lesion. Sinuses/Orbits: No acute finding. Other: None. IMPRESSION: 1. No acute intracranial findings. 2. Mild chronic microvascular ischemic change and cerebral volume loss. Electronically Signed   By: Davina Poke D.O.   On: 10/01/2020 16:28   CT Head W or Wo Contrast  Result Date: 10/09/2020 CLINICAL DATA:  Mental status change EXAM: CT HEAD WITHOUT AND WITH CONTRAST TECHNIQUE: Contiguous axial images were obtained from the base of the skull through the vertex without and  with intravenous contrast CONTRAST:  136mL OMNIPAQUE IOHEXOL 300 MG/ML  SOLN COMPARISON:  10/03/2020 FINDINGS: Brain: There is no acute intracranial hemorrhage, mass, mass effect, or edema. No abnormal enhancement. Gray-white differentiation is preserved. There is no extra-axial fluid collection. Ventricles and sulci are stable in size and configuration. Patchy hypoattenuation in the supratentorial white matter is nonspecific but likely reflects stable mild chronic microvascular ischemic changes. Vascular: There is atherosclerotic calcification at the skull base. Skull: Calvarium is unremarkable. Sinuses/Orbits: No acute finding. Other: None. IMPRESSION: No acute intracranial abnormality.  No abnormal enhancement. Electronically Signed   By: Macy Mis M.D.   On: 10/09/2020 09:55   CT ABDOMEN PELVIS W CONTRAST  Result Date: 10/09/2020 CLINICAL DATA:  Confusion, abdominal bloating, pain EXAM: CT ABDOMEN AND PELVIS WITH CONTRAST TECHNIQUE: Multidetector CT imaging of the abdomen and pelvis was performed using the standard protocol following bolus  administration of intravenous contrast. CONTRAST:  165mL OMNIPAQUE IOHEXOL 300 MG/ML  SOLN COMPARISON:  10/06/2020 FINDINGS: Lower chest: Moderate right pleural effusion. Compressive atelectasis in the right lower lobe. Stable mass in a right lower costochondral junction is unchanged. Postsurgical changes in the left breast. This is unchanged. Hepatobiliary: No change in the multiple masses throughout the liver presumably metastases. Dilated and thrombosed portal vein again noted with cavernous transformation, stable. Pancreas: Stable dilated pancreatic duct. Stable pancreatic body and pancreatic tail masses. Spleen: No focal abnormality.  Normal size. Adrenals/Urinary Tract: Stable low-density lesion off the upper pole of the left kidney measuring 1.8 cm. No hydronephrosis. Adrenal glands and urinary bladder unremarkable. Stomach/Bowel: Moderate stool throughout the colon. No evidence of bowel obstruction. Vascular/Lymphatic: Aortic atherosclerosis. Upper abdominal adenopathy again noted, the largest a portacaval node measuring up to 3.9 cm. Retroperitoneal adenopathy, stable. Reproductive: Prior hysterectomy Other: Large volume ascites in the abdomen and pelvis. Nodularity throughout the mesentery and omentum, most pronounced in the right abdomen and upper abdomen most likely reflects malignant ascites and peritoneal spread of disease. This may also be reflected within umbilical nodule, stable. Musculoskeletal: No acute bony abnormality or focal lesion. IMPRESSION: Extensive metastatic disease throughout the abdomen as described above. Findings stable since prior study. Chronic portal vein thrombosis with cavernous transformation, stable. Upper abdominal and retroperitoneal adenopathy, stable. Large volume ascites, likely malignant ascites, stable. No evidence of bowel obstruction. Stable moderate right pleural effusion with right lower lobe atelectasis. Electronically Signed   By: Rolm Baptise M.D.   On:  10/09/2020 10:15   CT ABDOMEN PELVIS W CONTRAST  Result Date: 10/06/2020 CLINICAL DATA:  Abdominal swelling, vomiting, history of left breast cancer, metastatic disease on previous CT EXAM: CT ABDOMEN AND PELVIS WITH CONTRAST TECHNIQUE: Multidetector CT imaging of the abdomen and pelvis was performed using the standard protocol following bolus administration of intravenous contrast. CONTRAST:  167mL OMNIPAQUE IOHEXOL 300 MG/ML  SOLN COMPARISON:  10/03/2020 FINDINGS: Lower chest: Stable right pleural effusion and right lower lobe atelectasis. The soft tissue mass at the right anterior sixth costochondral junction is unchanged, please correlate with recent biopsy results. Chronic postsurgical changes left breast. Hepatobiliary: No change in the multiple confluent hypodense masses within the liver consistent with presumed metastases. Dilated thrombosed portal vein again noted. Pancreas: There is marked pancreatic parenchymal atrophy. There are multiple pancreatic masses identified. In the pancreatic tail hypodense mass measures approximately 2.4 x 1.8 cm image 24/2. Hypodense mass in the dorsal aspect of the pancreatic body image 28/2 measures 2.2 x 1.9 cm, with hyperdense margins. Pancreatic duct dilation unchanged. Spleen: Spleen demonstrates normal  size without focal abnormality. Splenic vein is patent. Adrenals/Urinary Tract: Indeterminate 1.8 cm hypodensity upper pole left kidney with Hounsfield attenuation of 43, stable. No other focal renal abnormalities. No renal tract calculi or obstructive uropathy. The bladder is decompressed with no focal abnormalities. The adrenals are normal. Stomach/Bowel: There is moderate gas and stool throughout the colon which may reflect an element of constipation. No evidence of bowel obstruction or ileus. Vascular/Lymphatic: Extensive atherosclerosis of the aorta is again noted unchanged. Chronic portal vein thrombosis with cavernous transformation again seen. The splenic vein  and superior mesenteric vein are patent. Pathologic adenopathy is seen throughout the upper abdomen. 10 mm lymph node in the porta hepatis image 25/2 is stable. Multiple enlarged lymph nodes are seen within the gastrohepatic ligament, largest measuring 11 mm in short axis image 23/2. Necrotic lymph node dorsal to the pancreatic body image 28/2 measures 19 mm in short axis. Aortocaval lymph node image 36/2 measures 10 mm in short axis. Reproductive: Status post hysterectomy. No adnexal masses. Other: Moderate abdominal and pelvic ascites, without appreciable change since prior study. There is some soft tissue nodularity within the right upper quadrant mesentery, just inferior to the liver and along the hepatic flexure of the colon, concerning for peritoneal implant. No free intraperitoneal gas. Stable umbilical hernia containing mesenteric fat. Inflammatory changes within the hernia may suggest incarceration. Musculoskeletal: No acute or destructive bony lesions. Reconstructed images demonstrate no additional findings. IMPRESSION: 1. Extensive intra-abdominal and intrapelvic metastases, without significant change since prior study. Given the above findings, this may reflect primary pancreatic adenocarcinoma with diffuse metastases. Correlation with recent biopsy results recommended. No significant change in the appearance of the metastatic disease since prior study. 2. Moderate ascites, without significant change since prior study. This is likely malignant ascites given the soft tissue nodularity within the right upper quadrant mesentery. 3. Chronic portal vein thrombosis with cavernous transformation. 4. No evidence of bowel obstruction or ileus. Retained gas and stool throughout the colon may reflect constipation. 5. Stable right pleural effusion. 6.  Aortic Atherosclerosis (ICD10-I70.0). Electronically Signed   By: Randa Ngo M.D.   On: 10/06/2020 19:51   CT ABDOMEN PELVIS W CONTRAST  Addendum Date:  10/03/2020   ADDENDUM REPORT: 10/03/2020 14:10 ADDENDUM: ,There is soft tissue fullness along the anterior right lower rib cartilage, including on 14/2. This is new since 02/19/2020, also highly suspicious for metastatic disease. Electronically Signed   By: Abigail Miyamoto M.D.   On: 10/03/2020 14:10   Result Date: 10/03/2020 CLINICAL DATA:  Right-sided abdominal pain for about a week with nausea and vomiting x2. Constipation. Remote breast cancer history. EXAM: CT ABDOMEN AND PELVIS WITH CONTRAST TECHNIQUE: Multidetector CT imaging of the abdomen and pelvis was performed using the standard protocol following bolus administration of intravenous contrast. CONTRAST:  156mL OMNIPAQUE IOHEXOL 300 MG/ML  SOLN COMPARISON:  Multiple priors, including MRCP of 09/26/2020, abdominal CT of 02/19/2020, abdominal MRI 03/07/2020. FINDINGS: Lower chest: Right base atelectasis. Small right pleural effusion is new since the prior CT. Normal heart size with multivessel coronary artery atherosclerosis. Left-sided calcification or surgical clips. Deep to the presumed lumpectomy site is a centrally hypoattenuating lesion of 2.8 x 2.2 cm on 05/02. This is likely not imaged on the prior CT, but present on the 03/07/2020 MRI where it was grossly similar. Hepatobiliary: Multiple hepatic masses are again identified. A high right hepatic lobe lesion measures 2.6 cm on 13/2 versus 1.8 cm on 02/19/2020. A wedge-shaped mass within segments 4 and 2  of the liver measures on the order of 10.5 x 5.1 cm on 19/2. Significantly enlarged from 02/19/2020, where lesions in this region measured up to 3.0 cm. Increased from on the order of 3.3 cm on 03/07/2020. Cholecystectomy without biliary duct dilatation. Pancreas: The pancreas demonstrates similar atrophy with moderate duct dilatation at 9 mm on 29/2. Duct dilatation is followed to the level of the pancreatic head, with there is mild soft tissue fullness but no well-defined mass. Example 34/2. This is  unchanged. The pancreatic tail demonstrates developing relative soft tissue fullness compared to the pancreatic body. Example at on the order of 2.3 cm in thickness on 25/2 versus 1.4 cm on 02/19/2020. Spleen: Normal in size, without focal abnormality. Adrenals/Urinary Tract: Normal adrenal glands. Upper pole left renal lesion of 1.6 cm is greater than fluid density but was determined a complex cyst on prior MRI. Normal right kidney. Contrast in the urinary bladder secondary to reported outside CT performed yesterday. Stomach/Bowel: Tiny hiatal hernia. Normal distal stomach. Moderate amount of stool within the rectum. Scattered colonic diverticula. Normal terminal ileum. Normal small bowel caliber. Vascular/Lymphatic: Aortic atherosclerosis. Diffuse portal vein thrombosis again identified. Thrombus involves the superior aspect of the superior mesenteric vein, chronic. New and progressive abdominal adenopathy. An aortocaval node measures 1.0 cm on 39/2 versus 7 mm on 02/19/2020 (when remeasured). A necrotic node along the celiac, just posterior to the pancreatic body measures 1.9 cm on 30/2 and is new. Developing adenopathy in the gastrohepatic ligament including at 1.0 cm on 24/2. More ill-defined hypoattenuating lesions in the peripancreatic space, including at 2.6 cm in the portacaval space on 29/2, increased from 2.3 cm on 02/19/2020. No pelvic sidewall adenopathy. Reproductive: Hysterectomy.  No adnexal mass. Other: Since 02/19/2020, development of small volume abdominopelvic ascites. Peritoneal nodularity, including within the pelvic cul-de-sac on 78/2 and along the right-side of the abdomen on 39/2. No free intraperitoneal air. A new periumbilical nodule or node measures 1.9 cm on 57/2. Musculoskeletal: No acute osseous abnormality. Trace L4-5 anterolisthesis. Degenerate disc disease at the lumbosacral junction. IMPRESSION: 1. When compared to multiple prior exams, including most direct comparison to the  02/19/2020 CT, new or progressive metastatic disease, most definite involving necrotic abdominal nodes and the peritoneum as detailed above. Developing periumbilical nodularity, also likely metastatic. 2. Progressive liver lesions, also most likely indicative of metastatic disease. In the setting of portal vein thrombosis, progressive abscesses are possible but less likely. 3. Abnormal appearance of the pancreas, at least partially felt to be attributed to chronic pancreatitis. Developing soft tissue fullness within the pancreatic tail, with considerations of primary adenocarcinoma, metastatic disease, or superimposed acute pancreatitis. Peripancreatic hypoattenuating lesions, some of which are increased compared to 02/19/2020. These could be secondary to prior pancreatitis or also represent necrotic nodal metastasis. 4. Left breast mass in the setting of prior lumpectomy for carcinoma. Suspicious for new breast primary, incompletely imaged. 5.  Possible constipation. 6. New small volume abdominopelvic ascites. 7. Similar small right pleural effusion compared to the MRI of 09/26/2020. Electronically Signed: By: Abigail Miyamoto M.D. On: 10/03/2020 12:32   MR ABDOMEN MRCP WO CONTRAST  Result Date: 09/27/2020 CLINICAL DATA:  Pancreatitis. Pancreatic and liver lesions on prior MRI. Abdominal pain. EXAM: MRI ABDOMEN WITHOUT CONTRAST  (INCLUDING MRCP) TECHNIQUE: Multiplanar multisequence MR imaging of the abdomen was performed. Heavily T2-weighted images of the biliary and pancreatic ducts were obtained, and three-dimensional MRCP images were rendered by post processing. COMPARISON:  03/07/2020 MRI FINDINGS: Despite efforts by the technologist  and patient, severe motion artifact is present on today's exam and could not be eliminated. This reduces exam sensitivity and specificity. Due to pain, the patient terminated the exam prior to obtaining full diagnostic sequences. Lower chest: Small right pleural effusion is new  compared to prior MRI. Hepatobiliary: There is been evolution of the previous regions of stippled T2 signal hyperintensity scattered in the liver to current appearance of hazy accentuated T2 signal for example on image 9 of series 4. On biopsy, these were felt to represent areas of periductal fibrosis, chronic lymphoplasmacytic infiltrate, hemosiderin deposition, and regenerative hyperplasia suggesting finding secondary to portal vein thrombosis. This process is significantly expanded throughout segment 4a and 4B of the liver which not demonstrates diffuse hazy accentuated T2 signal as shown on image 14 series 4. There is associated reduced T1 signal in these regions of involvement. No steatosis. There is some associated restriction of diffusion in these regions of involvement. Unfortunately the MRCP images are severely blurred. No obvious extrahepatic biliary dilatation. There is some mild narrowing of the common hepatic duct along the porta hepatis for example on image 16 series 3, probably due to surrounding soft tissue swelling and possibly periportal fibrosis. As before, there is high T2 signal intensity material throughout the somewhat dilated intrahepatic portal venous system probably from thrombosis. Periportal edema noted. Pancreas: Peripancreatic edema noted with stippled accentuated T2 signal in the pancreas, and substantially dilated segments of the dorsal pancreatic duct especially in the pancreatic body, up to 1.0 cm in diameter. T2 signal hyperintensity partially involving the pancreatic head measuring about 4.5 by 1.6 by 2.6 cm, extending cephalad along the porta hepatis, I am uncertain whether this represents a thrombosed varix from the portal vein, pseudocyst, or a cystic pancreatic neoplasm. This appears roughly similar to the prior exam. Spleen:  Unremarkable Adrenals/Urinary Tract: Small renal lesions of varying complexity are present and probably a combination of simple and complex cysts,  although enhancement characteristics are not interrogated today. The adrenal glands appear normal. Stomach/Bowel: Unremarkable Vascular/Lymphatic: Aortoiliac atherosclerotic vascular disease. 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 Collaterals along the porta hepatis. Other: Ascites particularly along the paracolic gutters and in the pelvis (pelvic ascites seen on coronal images). Mesenteric edema mildly improved in the central mesentery compared to previous. Omental edema noted. There is hazy stranding in the root of the mesentery as on prior exams. Musculoskeletal: Lower lumbar spondylosis and degenerative disc disease. IMPRESSION: 1. Despite efforts by the technologist and patient, severe motion artifact is present on today's exam and could not be eliminated. The patient was also unable to complete the full sequence of the exam. This reduces exam sensitivity and specificity. 2. Evolution of the appearance in the liver with the previously stippled T2 signal hyperintensities now appearing as hazy increased T2 signal, and with substantially increased involvement throughout segment 4 and 4B of the liver. Based on prior biopsy results this likely represents combination of fibrosis, lymphoplasmacytic infiltrate, hemosiderin deposition, and periportal hyperplasia resulting from portal vein thrombosis. 3. 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 No obvious extrahepatic biliary dilatation. There is some mild narrowing of the common hepatic duct along the porta hepatis, probably due to surrounding soft tissue swelling and possibly periportal fibrosis. 4. Peripancreatic edema with stippled accentuated T2 signal in the pancreas, and substantially dilated segments  of the dorsal pancreatic duct  especially in the pancreatic body. 5. Lobulated T2 signal hyperintensity along the pancreatic head, possibly a pseudocyst or a thrombosed varix of the portal vein, less likely cystic pancreatic neoplasm. 6. Small right pleural effusion is new compared to previous MRI. 7. Reduced central mesenteric edema compared to previous, although there is still some peripancreatic edema as well as mesenteric edema and ascites. 8. Lower lumbar spondylosis and degenerative disc disease. Electronically Signed   By: Van Clines M.D.   On: 09/27/2020 16:11   US Paracentesis  Result Date: 10/09/2020 INDICATION: 75 year old female with history of breast cancer and abdominal pain found to have large volume ascites. EXAM: ULTRASOUND GUIDED left lower quadrant PARACENTESIS MEDICATIONS: None. COMPLICATIONS: None immediate. PROCEDURE: Informed written consent was obtained from the patient after a discussion of the risks, benefits and alternatives to treatment. A timeout was performed prior to the initiation of the procedure. Initial ultrasound scanning demonstrates a large amount of ascites within the left lower abdominal quadrant. The right lower abdomen was prepped and draped in the usual sterile fashion. 1% lidocaine was used for local anesthesia. Following this, a 6 Fr Safe-T-Centesis catheter was introduced. An ultrasound image was saved for documentation purposes. The paracentesis was performed. The catheter was removed and a dressing was applied. The patient tolerated the procedure well without immediate post procedural complication. FINDINGS: A total of approximately 3.05 L of translucent, straw-colored fluid was removed. Samples were sent to the laboratory as requested by the clinical team. IMPRESSION: Successful ultrasound-guided paracentesis yielding 3.05 liters of peritoneal fluid. Ruthann Cancer, MD [facility] Electronically Signed   By: Ruthann Cancer MD   On:  10/09/2020 15:53   DG Chest Portable 1 View  Result Date: 10/09/2020 CLINICAL DATA:  Altered mental status.  Rule out pneumonia EXAM: PORTABLE CHEST 1 VIEW COMPARISON:  10/04/2020 FINDINGS: Progression of right lower lobe airspace disease.  No effusion Left lung remains clear.  Negative for heart failure. IMPRESSION: Progression of right lower lobe airspace disease which may represent atelectasis or pneumonia. Electronically Signed   By: Franchot Gallo M.D.   On: 10/09/2020 10:11   [place] CORE BIOPSY (SOFT TISSUE)  Result Date: 10/04/2020 INDICATION: 75 year old woman with history of breast cancer presented to interventional radiology for biopsy of new right chest wall mass. EXAM: Ultrasound-guided biopsy of right chest wall mass MEDICATIONS: None. ANESTHESIA/SEDATION: Moderate (conscious) sedation was employed during this procedure. A total of Versed 2 mg and Fentanyl 100 mcg was administered intravenously. Moderate Sedation Time: 8 minutes. The patient's level of consciousness and vital signs were monitored continuously by radiology nursing throughout the procedure under my direct supervision. COMPLICATIONS: None immediate. PROCEDURE: Informed written consent was obtained from the patient after a thorough discussion of the procedural risks, benefits and alternatives. All questions were addressed. Maximal Sterile Barrier Technique was utilized including caps, mask, sterile gowns, sterile gloves, sterile drape, hand hygiene and skin antiseptic. A timeout was performed prior to the initiation of the procedure. Patient position supine on the ultrasound table. Right anterior lower chest wall skin prepped and draped in usual sterile fashion. Following local lidocaine administration, 18 gauge biopsy needle was advanced into the right anterior chest wall mass, and four cores were obtained utilizing continuous ultrasound guidance. Samples were sent to pathology in formalin. Needle removed and hemostasis achieved with 5  minutes of manual compression. Post procedure ultrasound images showed no evidence of significant hemorrhage. IMPRESSION: Ultrasound-guided biopsy of right anterior  chest wall mass. Electronically Signed   By: Miachel Roux M.D.   On: 10/04/2020 17:04    PERFORMANCE STATUS (ECOG) : 2 - Symptomatic, <50% confined to bed  Review of Systems Unless otherwise noted, a complete review of systems is negative.  Physical Exam General: NAD Pulmonary: Unlabored Extremities: no edema, no joint deformities Skin: no rashes Neurological: Weakness but otherwise nonfocal  IMPRESSION: Routine post hospital follow-up.  Patient is accompanied by her daughter.  Since discharging home from the hospital, patient says that she is not eating and drinking much.  She has had very little fluids and reports dry oral mucosa and some dizziness with standing.  She was hypotensive today in the clinic.  However, she is on several antihypertensives.  It appears that she is clinically dehydrated.  It is unclear if we will be able to give IV fluids today in the clinic given the late timing of her visit this afternoon.  However, patient will see Dr. Rogue Bussing today as well.  We will send a referral for nutritionist to follow.  Plan is to continue chemotherapy.  Patient denies pain or other distressing symptoms today.  She is frail-appearing.  ACP documents were provided in the hospital.  Patient opted for DNR/DNI and was sent home with a DNR form.  PLAN: -Continue current scope of treatment -ACP documents provided in the hospital -DNR/DNI -Referral for nutritionist -RTC 2 weeks   Patient expressed understanding and was in agreement with this plan. She also understands that She can call the clinic at any time with any questions, concerns, or complaints.     Time Total: 15 minutes  Visit consisted of counseling and education dealing with the complex and emotionally intense issues of symptom management and palliative  care in the setting of serious and potentially life-threatening illness.Greater than 50%  of this time was spent counseling and coordinating care related to the above assessment and plan.  Signed by: Altha Harm, PhD, NP-C

## 2020-10-15 NOTE — ED Notes (Signed)
Resumed care from [place] rn.  Dr Ubaldo Glassing in with pt now. Pt alert.  Pt has no iv.  Pt waiting on picc line placement.

## 2020-10-15 NOTE — Consult Note (Signed)
ANTICOAGULATION CONSULT NOTE - Consult  Pharmacy Consult for Heparin gtt Indication: chest pain/ACS (NSTEMI)  Allergies  Allergen Reactions  . Tetanus Toxoids Shortness Of Breath and Rash  . Shellfish Allergy     Patient Measurements: Height: 5\' 4"  (162.6 cm) Weight: 74.8 kg (165 lb) IBW/kg (Calculated) : 54.7 Heparin Dosing Weight: 70.3 kg  Vital Signs: Temp: 97.9 F (36.6 C) (03/22 1030) Temp Source: Oral (03/22 1030) BP: 118/101 (03/22 1030) Pulse Rate: 98 (03/22 1030)  Labs: Recent Labs    10/14/20 1448 10/15/20 1130  HGB 10.4* 9.9*  HCT 33.6* 31.0*  PLT PLATELET CLUMPS NOTED ON SMEAR, UNABLE TO ESTIMATE PLATELET CLUMPS NOTED ON SMEAR, UNABLE TO ESTIMATE  CREATININE 0.96 0.83  TROPONINIHS  --  431*    Estimated Creatinine Clearance: 58.9 mL/min (by C-G formula based on SCr of 0.83 mg/dL).   Medications:  PTA pt most recently prescribed enoxaparin 80mg  SQ q12h (last dose 3/21 @1100 ). 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 First dose of chemotherapy last week PTA. PTA pt on enoxaparin 1mg /kg q12h SQ last reported dose 1100 3/21. Pharmacy consulted for management of heparin gtt.  Labs:  Trop: 341> Hgb: 10.4>9.9 Plt: "non-reported - clumps on smear"  Goal of Therapy:  Heparin level 0.3-0.7 units/ml Monitor platelets by anticoagulation protocol: Yes   Plan:  PTA lovenox 80mg  q12h SQ (last dose 3/21 1100); will order BL anti-Xa and start heparin gtt as detailed below.   Give 4000 units bolus x 1 Start heparin infusion at 850 units/hr Check anti-Xa level in 8 hours and daily while on heparin Continue to monitor H&H and platelets   Theresa Andrews 10/15/2020,12:53 PM

## 2020-10-15 NOTE — Consult Note (Signed)
Cardiology Consultation Note    Patient ID: Theresa Andrews, MRN: [id number], DOB/AGE: 08/05/1945 75 y.o. Admit date: 10/15/2020   Date of Consult: 10/15/2020 Primary Physician: Steele Sizer, MD Primary Cardiologist: Dr. Nehemiah Massed  Chief Complaint: chest pain Reason for Consultation: chest pain/abnormal troponin Requesting MD: Dr. Francine Graven  HPI: Theresa Andrews is a 75 y.o. female with history of significant for breast carcinoma status post lumpectomy radiation and chemotherapy, history of dyslipidemia, history of portal vein thrombosis who presented to the emergency room complaining of chest pain.  She recently received chemotherapy including carboplatin and paclitaxel.  1 day prior to admission she was seen for poor oral intake and weakness and fatigue.  She was hypotensive in the office and received IV fluids and held her blood pressure medications.  She was seen in the cancer center today for follow-up and complained of midsternal chest pain.  She had no shortness of breath.  She was referred to the emergency room where laboratory showed a potassium of 4.8 creatinine of 0.83 troponin IV 131 and 400.  Hemoglobin was normal.  EKG showed sinus rhythm with nonspecific ST-T wave changes.  No acute ischemia.  Chest x-ray revealed a small right pleural effusion.  Symptoms have improved since presenting to the emergency room.  She is currently being treated with enteric-coated aspirin.  Metoprolol succinate 25 daily, rosuvastatin 10 mg daily.  Past Medical History:  Diagnosis Date  . Adenocarcinoma of unknown primary ([place])   . Anxiety   . Breast cancer ([place])    pT2 pN0 cM0 (stage IIA) invasive mammary carcinoma of left outer breast status post lumpectomy and sentinel node study on July 14, 2010  . CAD (coronary artery disease)   . Depression   . H/O hypokalemia   . History of chemotherapy   . History of MI (myocardial infarction)   . Hyperglycemia   . Hyperlipidemia   . Hypertension   .  Peripheral neuropathy due to chemotherapy ([place])   . Personal history of chemotherapy 2011   left breast ca  . Personal history of radiation therapy 2001   left breast ca      Surgical History:  Past Surgical History:  Procedure Laterality Date  . ABDOMINAL HYSTERECTOMY    . BREAST BIOPSY Left 2008   neg  . BREAST BIOPSY Left 03/05/2010   invasive mammary carcinoma  . BREAST LUMPECTOMY Left 07/14/2010   INVASIVE MAMMARY CARCINOMA WITH PROMINENT INFILTRATIVE PATTERN, negative margins  . CHOLECYSTECTOMY    . COLONOSCOPY WITH PROPOFOL N/A 11/22/2015   Procedure: COLONOSCOPY WITH PROPOFOL;  Surgeon: Hulen Luster, MD;  Location: [facility] ENDOSCOPY;  Service: Gastroenterology;  Laterality: N/A;  . CORONARY ANGIOPLASTY WITH STENT PLACEMENT  2008     Home Meds: Prior to Admission medications   Medication Sig Start Date End Date Taking? Authorizing Provider  amLODipine (NORVASC) 10 MG tablet Take 1 tablet (10 mg total) by mouth daily. 07/29/20   Steele Sizer, MD  cholecalciferol (VITAMIN D) 1000 units tablet Take 1,000 Units by mouth daily.    [provider]  enoxaparin (LOVENOX) 80 MG/0.8ML injection Inject into the skin. 10/04/20   [provider]  feeding supplement (ENSURE ENLIVE / ENSURE PLUS) LIQD Take 237 mLs by mouth 2 (two) times daily between meals. 10/13/20   Ezekiel Slocumb, DO  Lactulose 20 GM/30ML SOLN Take 30 mLs (20 g total) by mouth daily as needed. 10/04/20   Sharen Hones, MD  metoprolol succinate (TOPROL-XL) 25 MG 24 hr  tablet Take 0.5 tablets (12.5 mg total) by mouth daily. 07/29/20   Steele Sizer, MD  ondansetron (ZOFRAN) 4 MG tablet Take 1 tablet (4 mg total) by mouth daily as needed for nausea or vomiting. 10/04/20 10/04/21  Sharen Hones, MD  oxyCODONE-acetaminophen (PERCOCET) 10-325 MG tablet Take 1 tablet by mouth every 6 (six) hours as needed for pain. 10/04/20   Sharen Hones, MD  PARoxetine (PAXIL) 10 MG tablet Take 1 tablet (10 mg total) by mouth daily.  09/13/20   Steele Sizer, MD  polyethylene glycol (MIRALAX / GLYCOLAX) 17 g packet Take 17 g by mouth 2 (two) times daily as needed. 10/04/20   Sharen Hones, MD  potassium chloride SA (KLOR-CON) 20 MEQ tablet 1 pill twice a day 08/08/20   Cammie Sickle, MD  rosuvastatin (CRESTOR) 10 MG tablet Take 1 tablet (10 mg total) by mouth daily. 09/04/20   Steele Sizer, MD  telmisartan (MICARDIS) 80 MG tablet One daily 07/29/20   Steele Sizer, MD    Inpatient Medications:  . aspirin EC  81 mg Oral Daily  . cholecalciferol  1,000 Units Oral Daily  . feeding supplement  237 mL Oral BID BM  . heparin  4,000 Units Intravenous Once  . metoprolol succinate  12.5 mg Oral Daily  . PARoxetine  10 mg Oral Daily  . polyethylene glycol  17 g Oral Daily  . rosuvastatin  10 mg Oral Daily   . heparin    . sodium chloride      Allergies:  Allergies  Allergen Reactions  . Tetanus Toxoids Shortness Of Breath and Rash  . Shellfish Allergy     Social History   Socioeconomic History  . Marital status: Divorced    Spouse name: Not on file  . Number of children: 3  . Years of education: some college, no degree  . Highest education level: 12th grade  Occupational History    Employer: RETIRED  Tobacco Use  . Smoking status: Former Smoker    Packs/day: 0.25    Years: 25.00    Pack years: 6.25    Types: Cigarettes    Quit date: 2008    Years since quitting: 14.2  . Smokeless tobacco: Never Used  . Tobacco comment: smoking cessation materials not required  Vaping Use  . Vaping Use: Never used  Substance and Sexual Activity  . Alcohol use: No    Comment: rare; holidays  . Drug use: No  . Sexual activity: Not Currently  Other Topics Concern  . Not on file  Social History Narrative   Pt lives alone   Social Determinants of Health   Financial Resource Strain: Low Risk   . Difficulty of Paying Living Expenses: Not hard at all  Food Insecurity: No Food Insecurity  . Worried About  Charity fundraiser in the Last Year: Never true  . Ran Out of Food in the Last Year: Never true  Transportation Needs: No Transportation Needs  . Lack of Transportation (Medical): No  . Lack of Transportation (Non-Medical): No  Physical Activity: Inactive  . Days of Exercise per Week: 0 days  . Minutes of Exercise per Session: 0 min  Stress: No Stress Concern Present  . Feeling of Stress : Not at all  Social Connections: Socially Isolated  . Frequency of Communication with Friends and Family: More than three times a week  . Frequency of Social Gatherings with Friends and Family: Three times a week  . Attends Religious Services: Never  .  Active Member of Clubs or Organizations: No  . Attends Archivist Meetings: Never  . Marital Status: Divorced  Human resources officer Violence: Not At Risk  . Fear of Current or Ex-Partner: No  . Emotionally Abused: No  . Physically Abused: No  . Sexually Abused: No     Family History  Problem Relation Age of Onset  . Breast cancer Sister 28  . Hypertension Mother   . Aneurysm Mother   . Heart attack Sister   . Birth defects Sister      Review of Systems: A 12-system review of systems was performed and is negative except as noted in the HPI.  Labs: No results for input(s): CKTOTAL, CKMB, TROPONINI in the last 72 hours. Lab Results  Component Value Date   WBC 11.4 (H) 10/15/2020   HGB 9.9 (L) 10/15/2020   HCT 31.0 (L) 10/15/2020   MCV 81.6 10/15/2020   PLT PLATELET CLUMPS NOTED ON SMEAR, UNABLE TO ESTIMATE 10/15/2020    Recent Labs  Lab 10/14/20 1448 10/15/20 1130  NA 138 136  K 4.7 4.8  CL 107 108  CO2 20* 19*  BUN 40* 38*  CREATININE 0.96 0.83  CALCIUM 9.1 8.9  PROT 6.1*  --   BILITOT 0.8  --   ALKPHOS 92  --   ALT 20  --   AST 30  --   GLUCOSE 123* 123*   Lab Results  Component Value Date   CHOL 130 08/08/2020   HDL 40 (L) 08/08/2020   LDLCALC 76 08/08/2020   TRIG 68 08/08/2020   No results found for:  DDIMER  Radiology/Studies:  DG Chest 2 View  Result Date: 10/15/2020 CLINICAL DATA:  Chest pain for 30 minutes, upper chest pain. EXAM: CHEST - 2 VIEW COMPARISON:  October 09, 2020 FINDINGS: Trachea midline. Low lung volumes. Cardiomediastinal contours are stable and accentuated by low depth of expansion. Calcified atheromatous plaque in the thoracic aorta. Basilar opacities. Increased basilar opacification on the RIGHT as compared to the previous study. Persistent linear opacity in the RIGHT mid chest. Effusion seen on lateral radiograph with blunting of RIGHT costodiaphragmatic sulcus. Likely increased slightly since the prior study. On limited assessment no acute skeletal process. IMPRESSION: Small RIGHT effusion slightly increased with increased basilar opacity at the RIGHT lung base may reflect developing infection or worsening volume loss in the setting of diminished lung volumes. Electronically Signed   By: Zetta Bills M.D.   On: 10/15/2020 12:44   DG Chest 2 View  Result Date: 10/04/2020 CLINICAL DATA:  Status post right chest wall mass biopsy EXAM: CHEST - 2 VIEW COMPARISON:  Chest radiograph March 26, 2010 FINDINGS: The heart size and mediastinal contours are within normal limits. Calcifications aortic arch. No visible pneumothorax. Right lower lobe airspace opacity. Small right pleural effusion. No visible pneumothorax. Thoracic spondylosis. IMPRESSION: Right lower lobe atelectasis with right pleural effusion. No visible pneumothorax. Electronically Signed   By: Dahlia Bailiff MD   On: 10/04/2020 12:36   CT Head Wo Contrast  Result Date: 10/03/2020 CLINICAL DATA:  Headache, intracranial hemorrhage suspected EXAM: CT HEAD WITHOUT CONTRAST TECHNIQUE: Contiguous axial images were obtained from the base of the skull through the vertex without intravenous contrast. COMPARISON:  10/01/2020 FINDINGS: Brain: No evidence of acute infarction, hemorrhage, hydrocephalus, extra-axial collection or mass  lesion/mass effect. Mild periventricular and deep white matter hypodensity. Vascular: No hyperdense vessel or unexpected calcification. Skull: Normal. Negative for fracture or focal lesion. Sinuses/Orbits: No acute finding. Other:  None. IMPRESSION: 1. No acute intracranial pathology. No non-contrast CT findings to explain headache. Specifically, no evidence of intracranial hemorrhage. 2.  Small-vessel white matter disease. Electronically Signed   By: Eddie Candle M.D.   On: 10/03/2020 12:06   CT Head Wo Contrast  Result Date: 10/01/2020 CLINICAL DATA:  Altered mental status, unsteady gait EXAM: CT HEAD WITHOUT CONTRAST TECHNIQUE: Contiguous axial images were obtained from the base of the skull through the vertex without intravenous contrast. COMPARISON:  None. FINDINGS: Brain: No evidence of acute infarction, hemorrhage, hydrocephalus, extra-axial collection or mass lesion/mass effect. Scattered low-density changes within the periventricular and subcortical white matter compatible with chronic microvascular ischemic change. Mild diffuse cerebral volume loss. Vascular: Atherosclerotic calcifications involving the large vessels of the skull base. No unexpected hyperdense vessel. Skull: Normal. Negative for fracture or focal lesion. Sinuses/Orbits: No acute finding. Other: None. IMPRESSION: 1. No acute intracranial findings. 2. Mild chronic microvascular ischemic change and cerebral volume loss. Electronically Signed   By: Davina Poke D.O.   On: 10/01/2020 16:28   CT Head W or Wo Contrast  Result Date: 10/09/2020 CLINICAL DATA:  Mental status change EXAM: CT HEAD WITHOUT AND WITH CONTRAST TECHNIQUE: Contiguous axial images were obtained from the base of the skull through the vertex without and with intravenous contrast CONTRAST:  135mL OMNIPAQUE IOHEXOL 300 MG/ML  SOLN COMPARISON:  10/03/2020 FINDINGS: Brain: There is no acute intracranial hemorrhage, mass, mass effect, or edema. No abnormal enhancement.  Gray-white differentiation is preserved. There is no extra-axial fluid collection. Ventricles and sulci are stable in size and configuration. Patchy hypoattenuation in the supratentorial white matter is nonspecific but likely reflects stable mild chronic microvascular ischemic changes. Vascular: There is atherosclerotic calcification at the skull base. Skull: Calvarium is unremarkable. Sinuses/Orbits: No acute finding. Other: None. IMPRESSION: No acute intracranial abnormality.  No abnormal enhancement. Electronically Signed   By: Macy Mis M.D.   On: 10/09/2020 09:55   CT ABDOMEN PELVIS W CONTRAST  Result Date: 10/09/2020 CLINICAL DATA:  Confusion, abdominal bloating, pain EXAM: CT ABDOMEN AND PELVIS WITH CONTRAST TECHNIQUE: Multidetector CT imaging of the abdomen and pelvis was performed using the standard protocol following bolus administration of intravenous contrast. CONTRAST:  153mL OMNIPAQUE IOHEXOL 300 MG/ML  SOLN COMPARISON:  10/06/2020 FINDINGS: Lower chest: Moderate right pleural effusion. Compressive atelectasis in the right lower lobe. Stable mass in a right lower costochondral junction is unchanged. Postsurgical changes in the left breast. This is unchanged. Hepatobiliary: No change in the multiple masses throughout the liver presumably metastases. Dilated and thrombosed portal vein again noted with cavernous transformation, stable. Pancreas: Stable dilated pancreatic duct. Stable pancreatic body and pancreatic tail masses. Spleen: No focal abnormality.  Normal size. Adrenals/Urinary Tract: Stable low-density lesion off the upper pole of the left kidney measuring 1.8 cm. No hydronephrosis. Adrenal glands and urinary bladder unremarkable. Stomach/Bowel: Moderate stool throughout the colon. No evidence of bowel obstruction. Vascular/Lymphatic: Aortic atherosclerosis. Upper abdominal adenopathy again noted, the largest a portacaval node measuring up to 3.9 cm. Retroperitoneal adenopathy, stable.  Reproductive: Prior hysterectomy Other: Large volume ascites in the abdomen and pelvis. Nodularity throughout the mesentery and omentum, most pronounced in the right abdomen and upper abdomen most likely reflects malignant ascites and peritoneal spread of disease. This may also be reflected within umbilical nodule, stable. Musculoskeletal: No acute bony abnormality or focal lesion. IMPRESSION: Extensive metastatic disease throughout the abdomen as described above. Findings stable since prior study. Chronic portal vein thrombosis with cavernous transformation, stable. Upper  abdominal and retroperitoneal adenopathy, stable. Large volume ascites, likely malignant ascites, stable. No evidence of bowel obstruction. Stable moderate right pleural effusion with right lower lobe atelectasis. Electronically Signed   By: Rolm Baptise M.D.   On: 10/09/2020 10:15   CT ABDOMEN PELVIS W CONTRAST  Result Date: 10/06/2020 CLINICAL DATA:  Abdominal swelling, vomiting, history of left breast cancer, metastatic disease on previous CT EXAM: CT ABDOMEN AND PELVIS WITH CONTRAST TECHNIQUE: Multidetector CT imaging of the abdomen and pelvis was performed using the standard protocol following bolus administration of intravenous contrast. CONTRAST:  179mL OMNIPAQUE IOHEXOL 300 MG/ML  SOLN COMPARISON:  10/03/2020 FINDINGS: Lower chest: Stable right pleural effusion and right lower lobe atelectasis. The soft tissue mass at the right anterior sixth costochondral junction is unchanged, please correlate with recent biopsy results. Chronic postsurgical changes left breast. Hepatobiliary: No change in the multiple confluent hypodense masses within the liver consistent with presumed metastases. Dilated thrombosed portal vein again noted. Pancreas: There is marked pancreatic parenchymal atrophy. There are multiple pancreatic masses identified. In the pancreatic tail hypodense mass measures approximately 2.4 x 1.8 cm image 24/2. Hypodense mass in  the dorsal aspect of the pancreatic body image 28/2 measures 2.2 x 1.9 cm, with hyperdense margins. Pancreatic duct dilation unchanged. Spleen: Spleen demonstrates normal size without focal abnormality. Splenic vein is patent. Adrenals/Urinary Tract: Indeterminate 1.8 cm hypodensity upper pole left kidney with Hounsfield attenuation of 43, stable. No other focal renal abnormalities. No renal tract calculi or obstructive uropathy. The bladder is decompressed with no focal abnormalities. The adrenals are normal. Stomach/Bowel: There is moderate gas and stool throughout the colon which may reflect an element of constipation. No evidence of bowel obstruction or ileus. Vascular/Lymphatic: Extensive atherosclerosis of the aorta is again noted unchanged. Chronic portal vein thrombosis with cavernous transformation again seen. The splenic vein and superior mesenteric vein are patent. Pathologic adenopathy is seen throughout the upper abdomen. 10 mm lymph node in the porta hepatis image 25/2 is stable. Multiple enlarged lymph nodes are seen within the gastrohepatic ligament, largest measuring 11 mm in short axis image 23/2. Necrotic lymph node dorsal to the pancreatic body image 28/2 measures 19 mm in short axis. Aortocaval lymph node image 36/2 measures 10 mm in short axis. Reproductive: Status post hysterectomy. No adnexal masses. Other: Moderate abdominal and pelvic ascites, without appreciable change since prior study. There is some soft tissue nodularity within the right upper quadrant mesentery, just inferior to the liver and along the hepatic flexure of the colon, concerning for peritoneal implant. No free intraperitoneal gas. Stable umbilical hernia containing mesenteric fat. Inflammatory changes within the hernia may suggest incarceration. Musculoskeletal: No acute or destructive bony lesions. Reconstructed images demonstrate no additional findings. IMPRESSION: 1. Extensive intra-abdominal and intrapelvic  metastases, without significant change since prior study. Given the above findings, this may reflect primary pancreatic adenocarcinoma with diffuse metastases. Correlation with recent biopsy results recommended. No significant change in the appearance of the metastatic disease since prior study. 2. Moderate ascites, without significant change since prior study. This is likely malignant ascites given the soft tissue nodularity within the right upper quadrant mesentery. 3. Chronic portal vein thrombosis with cavernous transformation. 4. No evidence of bowel obstruction or ileus. Retained gas and stool throughout the colon may reflect constipation. 5. Stable right pleural effusion. 6.  Aortic Atherosclerosis (ICD10-I70.0). Electronically Signed   By: Randa Ngo M.D.   On: 10/06/2020 19:51   CT ABDOMEN PELVIS W CONTRAST  Addendum Date: 10/03/2020  ADDENDUM REPORT: 10/03/2020 14:10 ADDENDUM: ,There is soft tissue fullness along the anterior right lower rib cartilage, including on 14/2. This is new since 02/19/2020, also highly suspicious for metastatic disease. Electronically Signed   By: Abigail Miyamoto M.D.   On: 10/03/2020 14:10   Result Date: 10/03/2020 CLINICAL DATA:  Right-sided abdominal pain for about a week with nausea and vomiting x2. Constipation. Remote breast cancer history. EXAM: CT ABDOMEN AND PELVIS WITH CONTRAST TECHNIQUE: Multidetector CT imaging of the abdomen and pelvis was performed using the standard protocol following bolus administration of intravenous contrast. CONTRAST:  132mL OMNIPAQUE IOHEXOL 300 MG/ML  SOLN COMPARISON:  Multiple priors, including MRCP of 09/26/2020, abdominal CT of 02/19/2020, abdominal MRI 03/07/2020. FINDINGS: Lower chest: Right base atelectasis. Small right pleural effusion is new since the prior CT. Normal heart size with multivessel coronary artery atherosclerosis. Left-sided calcification or surgical clips. Deep to the presumed lumpectomy site is a centrally  hypoattenuating lesion of 2.8 x 2.2 cm on 05/02. This is likely not imaged on the prior CT, but present on the 03/07/2020 MRI where it was grossly similar. Hepatobiliary: Multiple hepatic masses are again identified. A high right hepatic lobe lesion measures 2.6 cm on 13/2 versus 1.8 cm on 02/19/2020. A wedge-shaped mass within segments 4 and 2 of the liver measures on the order of 10.5 x 5.1 cm on 19/2. Significantly enlarged from 02/19/2020, where lesions in this region measured up to 3.0 cm. Increased from on the order of 3.3 cm on 03/07/2020. Cholecystectomy without biliary duct dilatation. Pancreas: The pancreas demonstrates similar atrophy with moderate duct dilatation at 9 mm on 29/2. Duct dilatation is followed to the level of the pancreatic head, with there is mild soft tissue fullness but no well-defined mass. Example 34/2. This is unchanged. The pancreatic tail demonstrates developing relative soft tissue fullness compared to the pancreatic body. Example at on the order of 2.3 cm in thickness on 25/2 versus 1.4 cm on 02/19/2020. Spleen: Normal in size, without focal abnormality. Adrenals/Urinary Tract: Normal adrenal glands. Upper pole left renal lesion of 1.6 cm is greater than fluid density but was determined a complex cyst on prior MRI. Normal right kidney. Contrast in the urinary bladder secondary to reported outside CT performed yesterday. Stomach/Bowel: Tiny hiatal hernia. Normal distal stomach. Moderate amount of stool within the rectum. Scattered colonic diverticula. Normal terminal ileum. Normal small bowel caliber. Vascular/Lymphatic: Aortic atherosclerosis. Diffuse portal vein thrombosis again identified. Thrombus involves the superior aspect of the superior mesenteric vein, chronic. New and progressive abdominal adenopathy. An aortocaval node measures 1.0 cm on 39/2 versus 7 mm on 02/19/2020 (when remeasured). A necrotic node along the celiac, just posterior to the pancreatic body measures 1.9  cm on 30/2 and is new. Developing adenopathy in the gastrohepatic ligament including at 1.0 cm on 24/2. More ill-defined hypoattenuating lesions in the peripancreatic space, including at 2.6 cm in the portacaval space on 29/2, increased from 2.3 cm on 02/19/2020. No pelvic sidewall adenopathy. Reproductive: Hysterectomy.  No adnexal mass. Other: Since 02/19/2020, development of small volume abdominopelvic ascites. Peritoneal nodularity, including within the pelvic cul-de-sac on 78/2 and along the right-side of the abdomen on 39/2. No free intraperitoneal air. A new periumbilical nodule or node measures 1.9 cm on 57/2. Musculoskeletal: No acute osseous abnormality. Trace L4-5 anterolisthesis. Degenerate disc disease at the lumbosacral junction. IMPRESSION: 1. When compared to multiple prior exams, including most direct comparison to the 02/19/2020 CT, new or progressive metastatic disease, most definite involving necrotic abdominal  nodes and the peritoneum as detailed above. Developing periumbilical nodularity, also likely metastatic. 2. Progressive liver lesions, also most likely indicative of metastatic disease. In the setting of portal vein thrombosis, progressive abscesses are possible but less likely. 3. Abnormal appearance of the pancreas, at least partially felt to be attributed to chronic pancreatitis. Developing soft tissue fullness within the pancreatic tail, with considerations of primary adenocarcinoma, metastatic disease, or superimposed acute pancreatitis. Peripancreatic hypoattenuating lesions, some of which are increased compared to 02/19/2020. These could be secondary to prior pancreatitis or also represent necrotic nodal metastasis. 4. Left breast mass in the setting of prior lumpectomy for carcinoma. Suspicious for new breast primary, incompletely imaged. 5.  Possible constipation. 6. New small volume abdominopelvic ascites. 7. Similar small right pleural effusion compared to the MRI of 09/26/2020.  Electronically Signed: By: Abigail Miyamoto M.D. On: 10/03/2020 12:32   MR ABDOMEN MRCP WO CONTRAST  Result Date: 09/27/2020 CLINICAL DATA:  Pancreatitis. Pancreatic and liver lesions on prior MRI. Abdominal pain. EXAM: MRI ABDOMEN WITHOUT CONTRAST  (INCLUDING MRCP) TECHNIQUE: Multiplanar multisequence MR imaging of the abdomen was performed. Heavily T2-weighted images of the biliary and pancreatic ducts were obtained, and three-dimensional MRCP images were rendered by post processing. COMPARISON:  03/07/2020 MRI FINDINGS: Despite efforts by the technologist and patient, severe motion artifact is present on today's exam and could not be eliminated. This reduces exam sensitivity and specificity. Due to pain, the patient terminated the exam prior to obtaining full diagnostic sequences. Lower chest: Small right pleural effusion is new compared to prior MRI. Hepatobiliary: There is been evolution of the previous regions of stippled T2 signal hyperintensity scattered in the liver to current appearance of hazy accentuated T2 signal for example on image 9 of series 4. On biopsy, these were felt to represent areas of periductal fibrosis, chronic lymphoplasmacytic infiltrate, hemosiderin deposition, and regenerative hyperplasia suggesting finding secondary to portal vein thrombosis. This process is significantly expanded throughout segment 4a and 4B of the liver which not demonstrates diffuse hazy accentuated T2 signal as shown on image 14 series 4. There is associated reduced T1 signal in these regions of involvement. No steatosis. There is some associated restriction of diffusion in these regions of involvement. Unfortunately the MRCP images are severely blurred. No obvious extrahepatic biliary dilatation. There is some mild narrowing of the common hepatic duct along the porta hepatis for example on image 16 series 3, probably due to surrounding soft tissue swelling and possibly periportal fibrosis. As before, there is high  T2 signal intensity material throughout the somewhat dilated intrahepatic portal venous system probably from thrombosis. Periportal edema noted. Pancreas: Peripancreatic edema noted with stippled accentuated T2 signal in the pancreas, and substantially dilated segments of the dorsal pancreatic duct especially in the pancreatic body, up to 1.0 cm in diameter. T2 signal hyperintensity partially involving the pancreatic head measuring about 4.5 by 1.6 by 2.6 cm, extending cephalad along the porta hepatis, I am uncertain whether this represents a thrombosed varix from the portal vein, pseudocyst, or a cystic pancreatic neoplasm. This appears roughly similar to the prior exam. Spleen:  Unremarkable Adrenals/Urinary Tract: Small renal lesions of varying complexity are present and probably a combination of simple and complex cysts, although enhancement characteristics are not interrogated today. The adrenal glands appear normal. Stomach/Bowel: Unremarkable Vascular/Lymphatic: Aortoiliac atherosclerotic vascular disease. 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 Collaterals along the porta hepatis. Other: Ascites  particularly along the paracolic gutters and in the pelvis (pelvic ascites seen on coronal images). Mesenteric edema mildly improved in the central mesentery compared to previous. Omental edema noted. There is hazy stranding in the root of the mesentery as on prior exams. Musculoskeletal: Lower lumbar spondylosis and degenerative disc disease. IMPRESSION: 1. Despite efforts by the technologist and patient, severe motion artifact is present on today's exam and could not be eliminated. The patient was also unable to complete the full sequence of the exam. This reduces exam sensitivity and specificity. 2. Evolution of the appearance in the liver with the previously stippled T2 signal hyperintensities now  appearing as hazy increased T2 signal, and with substantially increased involvement throughout segment 4 and 4B of the liver. Based on prior biopsy results this likely represents combination of fibrosis, lymphoplasmacytic infiltrate, hemosiderin deposition, and periportal hyperplasia resulting from portal vein thrombosis. 3. 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 No obvious extrahepatic biliary dilatation. There is some mild narrowing of the common hepatic duct along the porta hepatis, probably due to surrounding soft tissue swelling and possibly periportal fibrosis. 4. Peripancreatic edema with stippled accentuated T2 signal in the pancreas, and substantially dilated segments of the dorsal pancreatic duct especially in the pancreatic body. 5. Lobulated T2 signal hyperintensity along the pancreatic head, possibly a pseudocyst or a thrombosed varix of the portal vein, less likely cystic pancreatic neoplasm. 6. Small right pleural effusion is new compared to previous MRI. 7. Reduced central mesenteric edema compared to previous, although there is still some peripancreatic edema as well as mesenteric edema and ascites. 8. Lower lumbar spondylosis and degenerative disc disease. Electronically Signed   By: Van Clines M.D.   On: 09/27/2020 16:11   US Paracentesis  Result Date: 10/09/2020 INDICATION: 75 year old female with history of breast cancer and abdominal pain found to have large volume ascites. EXAM: ULTRASOUND GUIDED left lower quadrant PARACENTESIS MEDICATIONS: None. COMPLICATIONS: None immediate. PROCEDURE: Informed written consent was obtained from the patient after a discussion of the risks, benefits and alternatives to treatment. A timeout was performed prior to the initiation of the procedure. Initial ultrasound scanning demonstrates a large amount of ascites within the left  lower abdominal quadrant. The right lower abdomen was prepped and draped in the usual sterile fashion. 1% lidocaine was used for local anesthesia. Following this, a 6 Fr Safe-T-Centesis catheter was introduced. An ultrasound image was saved for documentation purposes. The paracentesis was performed. The catheter was removed and a dressing was applied. The patient tolerated the procedure well without immediate post procedural complication. FINDINGS: A total of approximately 3.05 L of translucent, straw-colored fluid was removed. Samples were sent to the laboratory as requested by the clinical team. IMPRESSION: Successful ultrasound-guided paracentesis yielding 3.05 liters of peritoneal fluid. Ruthann Cancer, MD [facility] Electronically Signed   By: Ruthann Cancer MD   On: 10/09/2020 15:53   DG Chest Portable 1 View  Result Date: 10/09/2020 CLINICAL DATA:  Altered mental status.  Rule out pneumonia EXAM: PORTABLE CHEST 1 VIEW COMPARISON:  10/04/2020 FINDINGS: Progression of right lower lobe airspace disease.  No effusion Left lung remains clear.  Negative for heart failure. IMPRESSION: Progression of right lower lobe airspace disease which may represent atelectasis or pneumonia. Electronically Signed   By: Franchot Gallo M.D.   On: 10/09/2020 10:11   [place] CORE BIOPSY (SOFT TISSUE)  Result Date: 10/04/2020 INDICATION: 74 year old woman with history of breast cancer presented to interventional radiology for biopsy of new right chest wall mass. EXAM: Ultrasound-guided biopsy of right chest wall mass MEDICATIONS: None. ANESTHESIA/SEDATION: Moderate (conscious) sedation was employed during this procedure. A total of Versed 2 mg and Fentanyl 100 mcg was administered intravenously. Moderate Sedation Time: 8 minutes. The patient's level of consciousness and vital signs were monitored continuously by radiology nursing throughout the procedure under my direct  supervision. COMPLICATIONS: None immediate. PROCEDURE: Informed written consent was obtained from the patient after a thorough discussion of the procedural risks, benefits and alternatives. All questions were addressed. Maximal Sterile Barrier Technique was utilized including caps, mask, sterile gowns, sterile gloves, sterile drape, hand hygiene and skin antiseptic. A timeout was performed prior to the initiation of the procedure. Patient position supine on the ultrasound table. Right anterior lower chest wall skin prepped and draped in usual sterile fashion. Following local lidocaine administration, 18 gauge biopsy needle was advanced into the right anterior chest wall mass, and four cores were obtained utilizing continuous ultrasound guidance. Samples were sent to pathology in formalin. Needle removed and hemostasis achieved with 5 minutes of manual compression. Post procedure ultrasound images showed no evidence of significant hemorrhage. IMPRESSION: Ultrasound-guided biopsy of right anterior chest wall mass. Electronically Signed   By: Miachel Roux M.D.   On: 10/04/2020 17:04   [place] EKG SITE RITE  Result Date: 10/15/2020 If [facility] image not attached, placement could not be confirmed due to current cardiac rhythm.   Wt Readings from Last 3 Encounters:  10/15/20 74.8 kg  10/14/20 70.8 kg  10/09/20 74.8 kg   With nonspecific ST-T wave changes EKG: Sinus rhythm  Physical Exam:  Blood pressure 105/68, pulse 90, temperature 97.9 F (36.6 C), temperature source Oral, resp. rate 20, height 5\' 4"  (1.626 m), weight 74.8 kg, SpO2 97 %. Body mass index is 28.32 kg/m. General: Well developed, well nourished, in no acute distress. Head: Normocephalic, atraumatic, sclera non-icteric, no xanthomas, nares are without discharge.  Neck: Negative for carotid bruits. JVD not elevated. Lungs: Clear bilaterally to auscultation without wheezes, rales, or rhonchi. Breathing is unlabored. Heart: RRR with S1 S2. No  murmurs, rubs, or gallops appreciated. Abdomen: Soft, non-tender, non-distended with normoactive bowel sounds. No hepatomegaly. No rebound/guarding. No obvious abdominal masses. Msk:  Strength and tone appear normal for age. Extremities: No clubbing or cyanosis. No edema.  Distal pedal pulses are 2+ and equal bilaterally. Neuro: Alert and oriented X 3. No facial asymmetry. No focal deficit. Moves all extremities spontaneously. Psych:  Responds to questions appropriately with a normal affect.     Assessment and Plan  75 year old female with history of metastatic breast carcinoma on chemotherapy currently receiving chemotherapy.  She presented after developing weakness and chest heaviness.  She was somewhat hypotensive in the oncologist office.  EKG on presentation is unremarkable for ischemia.  She does have elevated serum troponins however they are flat.  Symptoms and EKG as well as troponin not consistent with acute coronary syndrome.  We will continue to medical management.  1.  Would continue with metoprolol and rosuvastatin as well as aspirin.  Not a candidate for invasive evaluation at present given symptoms.  Not a candidate for heparin.  Will evaluate echo when available.  2 breast carcinoma-being treated by oncology.  Signed, Teodoro Spray MD 10/15/2020, 3:18 PM Pager: [phone]

## 2020-10-15 NOTE — H&P (Addendum)
History and Physical    Theresa Andrews EXN:[id number] DOB: 10-Apr-1946 DOA: 10/15/2020  PCP: Steele Sizer, MD   Patient coming from: Home  I have personally briefly reviewed patient's old medical records in [place]  Chief Complaint: Chest pain  HPI: Theresa Andrews is a 75 y.o. female with medical history significant for left-sided breast cancer status post lumpectomy, radiation and chemotherapy, history of hypertension, anxiety disorder, dyslipidemia, history of portal vein thrombosis and recently diagnosed metastatic carcinoma with primary either from the breast vs or pancreaticobiliary.  Patient received her first chemotherapy on 10/10/20 and received treatment with carboplatin and paclitaxel. Patient was seen by her oncologist 1 day prior to admission for evaluation of weakness, poor oral intake and fatigue.  Her blood pressure was in the 80s during the office visit and she received IV fluids and instructions to hold her antihypertensive medications.  Blood pressure improved with IV hydration and she was discharged home. Patient was seen again in the cancer center on the day of admission for follow-up and she complained of chest pain mostly midsternal, nonradiating and rated 8 x 10 in intensity at its worst.  She described it as feeling like she had an elephant on her chest.  She continues to complain of constipation She denied having any shortness of breath, no nausea, no vomiting, no palpitations, no diaphoresis, no fever, no chills, no cough, no diarrhea, no urinary symptoms, no headache, no blurred vision, no dizziness or lightheadedness. Labs show sodium 136, potassium 4.8, chloride 108, bicarb 19, glucose 123, BUN 38, creatinine 0.83, calcium 8.9, lipase 39, troponin 431 >> 400, white count 11.4, hemoglobin 9.9, hematocrit 31, MCV 81.6, RDW 16.5 Respiratory viral panel is negative Chest x-ray reviewed by me shows small RIGHT effusion slightly increased with increased basilar  opacity at the RIGHT lung base which may reflect developing infection or worsening volume loss in the setting of diminished lung volumes. Twelve-lead EKG reviewed by me shows sinus rhythm, multiple artifact.     ED Course: Patient is a 75 year old African-American female who was sent to the emergency room from the cancer center for evaluation of chest pain which was mostly midsternal and described as feeling like an elephant was sitting on her chest.  Pain was nonradiating and she denied having any other associated symptoms.  Her initial troponin was elevated at 431 and twelve-lead EKG shows no acute findings.  Patient received a dose of aspirin in the ER and was started on a heparin drip for presumed non-ST elevation MI.  She is currently chest pain-free.  She will be referred to observation status for further evaluation.    Review of Systems: As per HPI otherwise all other systems reviewed and negative.    Past Medical History:  Diagnosis Date  . Adenocarcinoma of unknown primary ([place])   . Anxiety   . Breast cancer ([place])    pT2 pN0 cM0 (stage IIA) invasive mammary carcinoma of left outer breast status post lumpectomy and sentinel node study on July 14, 2010  . CAD (coronary artery disease)   . Depression   . H/O hypokalemia   . History of chemotherapy   . History of MI (myocardial infarction)   . Hyperglycemia   . Hyperlipidemia   . Hypertension   . Peripheral neuropathy due to chemotherapy ([place])   . Personal history of chemotherapy 2011   left breast ca  . Personal history of radiation therapy 2001   left breast ca    Past Surgical  History:  Procedure Laterality Date  . ABDOMINAL HYSTERECTOMY    . BREAST BIOPSY Left 2008   neg  . BREAST BIOPSY Left 03/05/2010   invasive mammary carcinoma  . BREAST LUMPECTOMY Left 07/14/2010   INVASIVE MAMMARY CARCINOMA WITH PROMINENT INFILTRATIVE PATTERN, negative margins  . CHOLECYSTECTOMY    . COLONOSCOPY WITH PROPOFOL N/A  11/22/2015   Procedure: COLONOSCOPY WITH PROPOFOL;  Surgeon: Hulen Luster, MD;  [facility];  Service: Gastroenterology;  Laterality: N/A;  . CORONARY ANGIOPLASTY WITH STENT PLACEMENT  2008     reports that she quit smoking about 14 years ago. Her smoking use included cigarettes. She has a 6.25 pack-year smoking history. She has never used smokeless tobacco. She reports that she does not drink alcohol and does not use drugs.  Allergies  Allergen Reactions  . Tetanus Toxoids Shortness Of Breath and Rash  . Shellfish Allergy     Family History  Problem Relation Age of Onset  . Breast cancer Sister 7  . Hypertension Mother   . Aneurysm Mother   . Heart attack Sister   . Birth defects Sister       Prior to Admission medications   Medication Sig Start Date End Date Taking? Authorizing Provider  amLODipine (NORVASC) 10 MG tablet Take 1 tablet (10 mg total) by mouth daily. 07/29/20   Steele Sizer, MD  cholecalciferol (VITAMIN D) 1000 units tablet Take 1,000 Units by mouth daily.    [provider]  enoxaparin (LOVENOX) 80 MG/0.8ML injection Inject into the skin. 10/04/20   [provider]  feeding supplement (ENSURE ENLIVE / ENSURE PLUS) LIQD Take 237 mLs by mouth 2 (two) times daily between meals. 10/13/20   Ezekiel Slocumb, DO  Lactulose 20 GM/30ML SOLN Take 30 mLs (20 g total) by mouth daily as needed. 10/04/20   Sharen Hones, MD  metoprolol succinate (TOPROL-XL) 25 MG 24 hr tablet Take 0.5 tablets (12.5 mg total) by mouth daily. 07/29/20   Steele Sizer, MD  ondansetron (ZOFRAN) 4 MG tablet Take 1 tablet (4 mg total) by mouth daily as needed for nausea or vomiting. 10/04/20 10/04/21  Sharen Hones, MD  oxyCODONE-acetaminophen (PERCOCET) 10-325 MG tablet Take 1 tablet by mouth every 6 (six) hours as needed for pain. 10/04/20   Sharen Hones, MD  PARoxetine (PAXIL) 10 MG tablet Take 1 tablet (10 mg total) by mouth daily. 09/13/20   Steele Sizer, MD  polyethylene  glycol (MIRALAX / GLYCOLAX) 17 g packet Take 17 g by mouth 2 (two) times daily as needed. 10/04/20   Sharen Hones, MD  potassium chloride SA (KLOR-CON) 20 MEQ tablet 1 pill twice a day 08/08/20   Cammie Sickle, MD  rosuvastatin (CRESTOR) 10 MG tablet Take 1 tablet (10 mg total) by mouth daily. 09/04/20   Steele Sizer, MD  telmisartan (MICARDIS) 80 MG tablet One daily 07/29/20   Steele Sizer, MD    Physical Exam: Vitals:   10/15/20 1029 10/15/20 1030 10/15/20 1250  BP:  (!) 118/101 105/68  Pulse:  98 90  Resp:  18 20  Temp:  97.9 F (36.6 C)   TempSrc:  Oral   SpO2:  99% 97%  Weight: 74.8 kg    Height: 5\' 4"  (1.626 m)       Vitals:   10/15/20 1029 10/15/20 1030 10/15/20 1250  BP:  (!) 118/101 105/68  Pulse:  98 90  Resp:  18 20  Temp:  97.9 F (36.6 C)   TempSrc:  Oral   SpO2:  99% 97%  Weight: 74.8 kg    Height: 5\' 4"  (1.626 m)        Constitutional: Alert and oriented x 3 . Not in any apparent distress.  Chronically ill-appearing HEENT:      Head: Normocephalic and atraumatic.         Eyes: PERLA, EOMI, Conjunctivae pallor. Sclera is non-icteric.       Mouth/Throat: Mucous membranes are moist.       Neck: Supple with no signs of meningismus. Cardiovascular: Regular rate and rhythm. No murmurs, gallops, or rubs. 2+ symmetrical distal pulses are present . No JVD. No LE edema Respiratory: Respiratory effort normal .Lungs sounds clear bilaterally. No wheezes, crackles, or rhonchi.  Gastrointestinal: Soft, diffusely tender, distended with positive bowel sounds.  Genitourinary: No CVA tenderness. Musculoskeletal: Nontender with normal range of motion in all extremities. No cyanosis, or erythema of extremities. Neurologic:  Face is symmetric. Moving all extremities. No gross focal neurologic deficits.  Generalized weakness Skin: Skin is warm, dry.  No rash or ulcers Psychiatric: Mood and affect are normal   Labs on Admission: I have personally reviewed following  labs and imaging studies  CBC: Recent Labs  Lab 10/09/20 0821 10/10/20 0523 10/11/20 0435 10/12/20 0446 10/14/20 1448 10/15/20 1130  WBC 13.7* 13.3* 9.5 17.7* 14.2* 11.4*  NEUTROABS 11.0*  --   --   --  13.1*  --   HGB 10.0* 9.6* 9.2* 9.6* 10.4* 9.9*  HCT 32.4* 28.9* 29.4* 30.2* 33.6* 31.0*  MCV 84.4 81.6 83.8 83.2 84.4 81.6  PLT 265 198 148* 128* PLATELET CLUMPS NOTED ON SMEAR, UNABLE TO ESTIMATE PLATELET CLUMPS NOTED ON SMEAR, UNABLE TO ESTIMATE   Basic Metabolic Panel: Recent Labs  Lab 10/10/20 0523 10/11/20 0435 10/12/20 0446 10/14/20 1448 10/15/20 1130  NA 139 138 137 138 136  K 4.0 4.6 4.2 4.7 4.8  CL 110 109 110 107 108  CO2 20* 21* 23 20* 19*  GLUCOSE 121* 121* 109* 123* 123*  BUN 25* 28* 32* 40* 38*  CREATININE 0.62 0.67 0.64 0.96 0.83  CALCIUM 9.1 8.7* 8.6* 9.1 8.9  MG 2.2 2.3 2.2  --   --   PHOS 3.0 4.0 2.5  --   --    GFR: Estimated Creatinine Clearance: 58.9 mL/min (by C-G formula based on SCr of 0.83 mg/dL). Liver Function Tests: Recent Labs  Lab 10/09/20 0821 10/14/20 1448  AST 25 30  ALT 13 20  ALKPHOS 97 92  BILITOT 0.6 0.8  PROT 6.8 6.1*  ALBUMIN 3.0* 2.7*   Recent Labs  Lab 10/09/20 0821 10/15/20 1130  LIPASE 24 39   Recent Labs  Lab 10/09/20 1005  AMMONIA 11   Coagulation Profile: No results for input(s): INR, PROTIME in the last 168 hours. Cardiac Enzymes: No results for input(s): CKTOTAL, CKMB, CKMBINDEX, TROPONINI in the last 168 hours. BNP (last 3 results) No results for input(s): PROBNP in the last 8760 hours. HbA1C: No results for input(s): HGBA1C in the last 72 hours. CBG: No results for input(s): GLUCAP in the last 168 hours. Lipid Profile: No results for input(s): CHOL, HDL, LDLCALC, TRIG, CHOLHDL, LDLDIRECT in the last 72 hours. Thyroid Function Tests: No results for input(s): TSH, T4TOTAL, FREET4, T3FREE, THYROIDAB in the last 72 hours. Anemia Panel: No results for input(s): VITAMINB12, FOLATE, FERRITIN, TIBC,  IRON, RETICCTPCT in the last 72 hours. Urine analysis:    Component Value Date/Time   COLORURINE AMBER (A) 10/09/2020 9767  APPEARANCEUR CLOUDY (A) 10/09/2020 0857   LABSPEC 1.031 (H) 10/09/2020 0857   PHURINE 5.0 10/09/2020 [place] 10/09/2020 0857   HGBUR NEGATIVE 10/09/2020 0857   BILIRUBINUR NEGATIVE 10/09/2020 0857   KETONESUR 5 (A) 10/09/2020 0857   PROTEINUR 30 (A) 10/09/2020 0857   NITRITE NEGATIVE 10/09/2020 0857   LEUKOCYTESUR TRACE (A) 10/09/2020 0857    Radiological Exams on Admission: DG Chest 2 View  Result Date: 10/15/2020 CLINICAL DATA:  Chest pain for 30 minutes, upper chest pain. EXAM: CHEST - 2 VIEW COMPARISON:  October 09, 2020 FINDINGS: Trachea midline. Low lung volumes. Cardiomediastinal contours are stable and accentuated by low depth of expansion. Calcified atheromatous plaque in the thoracic aorta. Basilar opacities. Increased basilar opacification on the RIGHT as compared to the previous study. Persistent linear opacity in the RIGHT mid chest. Effusion seen on lateral radiograph with blunting of RIGHT costodiaphragmatic sulcus. Likely increased slightly since the prior study. On limited assessment no acute skeletal process. IMPRESSION: Small RIGHT effusion slightly increased with increased basilar opacity at the RIGHT lung base may reflect developing infection or worsening volume loss in the setting of diminished lung volumes. Electronically Signed   By: Zetta Bills M.D.   On: 10/15/2020 12:44   [place] EKG SITE RITE  Result Date: 10/15/2020 If [facility] image not attached, placement could not be confirmed due to current cardiac rhythm.    Assessment/Plan Principal Problem:   NSTEMI (non-ST elevated myocardial infarction) ([place]) Active Problems:   Portal vein thrombosis   Atherosclerosis of coronary artery   Benign essential HTN   Carcinoma of unknown primary ([place])   Depression      Non-ST elevation MI Patient has a history of coronary  artery disease previously diagnosed by imaging studies Patient's cardiac risk factors include age, history of cardiac disease, postmenopausal female, hypertension and dyslipidemia She presents for evaluation of chest pain mostly midsternal and non radiating. Chest pain was described as feeling like an elephant was sitting on her chest Twelve-lead EKG shows no acute findings but patient has elevated troponin levels. Continue low-dose metoprolol, aspirin and statins Continue heparin drip initiated in the ER Obtain 2D echocardiogram to assess LVEF and rule out regional wall motion abnormality We will consult cardiology     Metastatic adenocarcinoma with unknown primary Possible pancreaticobiliary vs breast Patient received her first dose of chemotherapy on 10/11/19 Follow-up with oncology on discharge   Hypertension Continue metoprolol Hold amlodipine atorvastatin due to relative hypotension   Depression/anxiety Continue Paxil   History of portal vein thrombosis Patient is on a heparin drip Resume therapeutic Lovenox upon discharge  DVT prophylaxis: Heparin Code Status: DO NOT RESUSCITATE Family Communication: Greater than 50% of time was spent discussing patient's condition and plan of care with her and her daughter at the bedside.  All questions and concerns have been addressed.  They verbalized understanding and agree with the plan.  CODE STATUS was discussed and she is a DNR Disposition Plan: Back to previous home environment Consults called: Cardiology Status: Observation    Tochukwu Agbata MD [facility]     10/15/2020, 2:16 PM

## 2020-10-15 NOTE — ED Provider Notes (Signed)
Angiocath insertion Performed by: Nance Pear  Consent: Verbal consent obtained. Risks and benefits: risks, benefits and alternatives were discussed Time out: Immediately prior to procedure a "time out" was called to verify the correct patient, procedure, equipment, support staff and site/side marked as required.  Preparation: Patient was prepped and draped in the usual sterile fashion.  Vein Location: right ac   Ultrasound Guided  Gauge: 20  Normal blood return and flush without difficulty Patient tolerance: Patient tolerated the procedure well with no immediate complications.      Nance Pear, MD 10/15/20 2329

## 2020-10-15 NOTE — ED Notes (Signed)
Family updated as to patient's status.

## 2020-10-15 NOTE — Consult Note (Addendum)
ANTICOAGULATION CONSULT NOTE - Consult  Pharmacy Consult for Heparin gtt --> Enoxaparin Tx Indication: chest pain/ACS (NSTEMI)  Allergies  Allergen Reactions  . Tetanus Toxoids Shortness Of Breath and Rash  . Shellfish Allergy     Patient Measurements: Height: 5\' 4"  (162.6 cm) Weight: 74.8 kg (165 lb) IBW/kg (Calculated) : 54.7 Heparin Dosing Weight: 70.3 kg  Vital Signs: Temp: 97.9 F (36.6 C) (03/22 1030) Temp Source: Oral (03/22 1030) BP: 109/89 (03/22 1515) Pulse Rate: 90 (03/22 1530)  Labs: Recent Labs    10/14/20 1448 10/15/20 1130 10/15/20 1250  HGB 10.4* 9.9*  --   HCT 33.6* 31.0*  --   PLT PLATELET CLUMPS NOTED ON SMEAR, UNABLE TO ESTIMATE PLATELET CLUMPS NOTED ON SMEAR, UNABLE TO ESTIMATE  --   CREATININE 0.96 0.83  --   TROPONINIHS  --  431* 400*    Estimated Creatinine Clearance: 58.9 mL/min (by C-G formula based on SCr of 0.83 mg/dL).   Medications:  PTA pt most recently prescribed enoxaparin 80mg  SQ q12h (last dose 3/21 @1100 ). 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 First dose of chemotherapy last week PTA. PTA pt on enoxaparin 1mg /kg q12h SQ last reported dose 1100 3/21. Pharmacy consulted for management of heparin gtt, now transitioning back to lovenox for ACS.  Labs:  Trop: 431> 400 (peaked) Hgb: 10.4>9.9 Plt: "non-reported - clumps on smear"  Goal of Therapy:  Heparin level 0.3-0.7 units/ml Monitor platelets by anticoagulation protocol: Yes   Plan:  Was on PTA lovenox 80mg  q12h SQ (last dose 3/21 1100). Started heparin gtt for ACS, order active from 3/22 1330-1600. To transition, stop heparin gtt and start lovenox first dose now.  Will dose lovenox 75mg  (1mg /kg) q12h SQ. Continue to  monitor H&H and platelets daily while on therapeutic AC.  Shanon Brow Deziah Renwick 10/15/2020,3:53 PM

## 2020-10-15 NOTE — ED Notes (Signed)
Pt assisted to toilet, assisted to bed, and repositioned. Pt is resting comfortably with eyes closed.

## 2020-10-15 NOTE — Telephone Encounter (Signed)
Copied from [place] [id number]. Topic: Quick Communication - Home Health Verbal Orders >> Oct 14, 2020  3:01 PM Tessa Lerner A wrote: - education on disrCaller/Agency: [facility] [place] Number: [phone]  Requesting OT/PT/Skilled Nursing/Social Work/Speech Therapy: Skilled Nursing  Frequency: 2w2 1w2 63m1 2prn - educations on diseases proc and medication effectiveness     10/15/2020 @ 11:24 AM - Spoke with Carlyon Shadow, Clinical Nurse Manager and gave requested verbal orders per Dr. Ancil Boozer.

## 2020-10-15 NOTE — ED Triage Notes (Signed)
Pt arrives to ER from [facility] center for central upper CP that began 30 minutes ago. States was at [facility] center for normal check up and began having pain. A&O, speaking in complete sentences. In wheelchair. Hx pancreatic CA, pt states dx last week.

## 2020-10-15 NOTE — Plan of Care (Signed)

## 2020-10-16 ENCOUNTER — Observation Stay
Admit: 2020-10-16 | Discharge: 2020-10-16 | Disposition: A | Discharge status: Home / Self Care | Payer: Medicare PPO | Attending: Internal Medicine | Admitting: Internal Medicine

## 2020-10-16 ENCOUNTER — Telehealth: Payer: Medicare PPO

## 2020-10-16 DIAGNOSIS — C801 Malignant (primary) neoplasm, unspecified: Secondary | ICD-10-CM

## 2020-10-16 DIAGNOSIS — Z7901 Long term (current) use of anticoagulants: Secondary | ICD-10-CM

## 2020-10-16 DIAGNOSIS — R079 Chest pain, unspecified: Secondary | ICD-10-CM

## 2020-10-16 DIAGNOSIS — I214 Non-ST elevation (NSTEMI) myocardial infarction: Secondary | ICD-10-CM | POA: Diagnosis not present

## 2020-10-16 DIAGNOSIS — D6959 Other secondary thrombocytopenia: Secondary | ICD-10-CM | POA: Diagnosis not present

## 2020-10-16 DIAGNOSIS — D696 Thrombocytopenia, unspecified: Secondary | ICD-10-CM

## 2020-10-16 DIAGNOSIS — Z66 Do not resuscitate: Secondary | ICD-10-CM | POA: Diagnosis not present

## 2020-10-16 DIAGNOSIS — I10 Essential (primary) hypertension: Secondary | ICD-10-CM | POA: Diagnosis not present

## 2020-10-16 DIAGNOSIS — Z87891 Personal history of nicotine dependence: Secondary | ICD-10-CM | POA: Diagnosis not present

## 2020-10-16 DIAGNOSIS — I81 Portal vein thrombosis: Secondary | ICD-10-CM | POA: Diagnosis not present

## 2020-10-16 LAB — IRON AND TIBC
Iron: 12 ug/dL — ABNORMAL LOW (ref 28–170)
Saturation Ratios: 6 % — ABNORMAL LOW (ref 10.4–31.8)
TIBC: 218 ug/dL — ABNORMAL LOW (ref 250–450)
UIBC: 206 ug/dL

## 2020-10-16 LAB — CBC
HCT: 30.7 % — ABNORMAL LOW (ref 36.0–46.0)
Hemoglobin: 9.5 g/dL — ABNORMAL LOW (ref 12.0–15.0)
MCH: 26.1 pg (ref 26.0–34.0)
MCHC: 30.9 g/dL (ref 30.0–36.0)
MCV: 84.3 fL (ref 80.0–100.0)
Platelets: 38 10*3/uL — ABNORMAL LOW (ref 150–400)
RBC: 3.64 MIL/uL — ABNORMAL LOW (ref 3.87–5.11)
RDW: 17.1 % — ABNORMAL HIGH (ref 11.5–15.5)
WBC: 8.2 10*3/uL (ref 4.0–10.5)
nRBC: 0 % (ref 0.0–0.2)

## 2020-10-16 LAB — ECHOCARDIOGRAM COMPLETE
AR max vel: 1.55 cm2
AV Area VTI: 1.76 cm2
AV Area mean vel: 1.57 cm2
AV Mean grad: 4 mmHg
AV Peak grad: 7.4 mmHg
Ao pk vel: 1.36 m/s
Area-P 1/2: 2.77 cm2
Height: 64 in
MV VTI: 2.03 cm2
S' Lateral: 2.1 cm
Weight: 2522.06 oz

## 2020-10-16 LAB — VITAMIN B12: Vitamin B-12: 1176 pg/mL — ABNORMAL HIGH (ref 180–914)

## 2020-10-16 MED ORDER — PERFLUTREN LIPID MICROSPHERE
1.0000 mL | INTRAVENOUS | Status: AC | PRN
  Administered 2020-10-16: 3 mL via INTRAVENOUS
  Filled 2020-10-16: qty 10

## 2020-10-16 MED ORDER — HYDROXYZINE HCL 25 MG PO TABS
25.0000 mg | ORAL_TABLET | Freq: Three times a day (TID) | ORAL | Status: DC | PRN
  Administered 2020-10-16 (×2): 25 mg via ORAL
  Filled 2020-10-16 (×2): qty 1

## 2020-10-16 NOTE — Progress Notes (Signed)
*  PRELIMINARY RESULTS* Echocardiogram 2D Echocardiogram has been performed.  Theresa Andrews 10/16/2020, 8:55 AM

## 2020-10-16 NOTE — Progress Notes (Signed)
Cancel PICC order received from Dr. Roosevelt Locks as patient has IV access and no IV fluids/meds at this time.

## 2020-10-16 NOTE — Assessment & Plan Note (Addendum)
#  75 year old female patient with a history of carcinoma of unknown primary is currently admitted to hospital for chest pain-noted to have thrombocytopenia:  #Carcinoma of unknown primary-stage IV -possible pancreatic versus left breast/recurrence-currently awaiting ER/PR HER-2/neu status on the chest wall soft tissue specimen.  Currently s/p carbotaxol cycle #1; day-7. Left breast mass-question recurrent malignancy-we will plan outpatient biopsy.  #Thrombocytopenia platelets 33-likely secondary chemotherapy; however immature patient fraction is up.  I would expect platelet count in the next few days.  #Chest pain/non-STEMI/elevated troponins; 2D echo-no significant wall motion abnormalities noted.  Reasonable to continue aspirin 81 mg a day.  #Portal vein thrombosis-recommend continued holding of anticoagulation.  Unless platelets above 50,000.  #Discussed with Dr. Roosevelt Locks; patient could be discharged home; will follow up closely early next week.  I left a message for patient's daughter Lisa-regarding the above plan.

## 2020-10-16 NOTE — Consult Note (Signed)
[place] NOTE  Patient Care Team: Steele Sizer, MD as PCP - General (Family Medicine) Cammie Sickle, MD as Consulting Physician (Oncology) Corey Skains, MD as Consulting Physician (Cardiology) Solum, Betsey Holiday, MD as Consulting Physician (Endocrinology) Baxter Kail, MD as Consulting Physician (Dermatology) Anell Barr, OD as Consulting Physician (Optometry) Pieter Partridge, MD as Referring Physician (Dermatology) Vern Claude, LCSW as Social Worker Neldon Labella, RN as Registered Nurse Michaelle Birks, Glean Salvo, [facility] (Pharmacist)  CHIEF COMPLAINTS/PURPOSE OF CONSULTATION:Cancer chemotherapy/chest pain  HISTORY OF PRESENTING ILLNESS:  Theresa Andrews 75 y.o.  female carcinoma of unknown primary [breast versus pancreatic] metastatic/stage IV; portal vein thrombosis on Lovenox; history of CAD is currently admitted to hospital for  chest pain.  Patient received chemotherapy with carbotaxol on 3/17 while in the hospital.  Patient was seen 1 day prior to admission-noted to have systolic blood pressures [id number] received IV fluids in the clinic; and held her antihypertensives.   Patient was again seen in the clinic for a follow-up the following day/on the day of admission-when she complained of chest pressure for which she was transferred to the emergency room for further work-up and evaluation.  On admission to hospital patient was noted to have-troponins in the range of 400; EKG showed-sinus rhythm.  Started on IV heparin.  However IV heparin subsequently on hold because of low platelets-38.  Chest x-ray-small right lung effusion/opacity.. White count 11; hemoglobin 9.9.  Covid negative.  Review of Systems  Constitutional: Positive for malaise/fatigue and weight loss. Negative for chills, diaphoresis and fever.  HENT: Negative for nosebleeds and sore throat.   Eyes: Negative for double vision.  Respiratory: Positive for shortness of breath. Negative for  cough, hemoptysis, sputum production and wheezing.   Cardiovascular: Positive for chest pain. Negative for palpitations, orthopnea and leg swelling.  Gastrointestinal: Negative for abdominal pain, blood in stool, constipation, diarrhea, heartburn, melena, nausea and vomiting.  Genitourinary: Negative for dysuria, frequency and urgency.  Musculoskeletal: Negative for back pain and joint pain.  Skin: Negative.  Negative for itching and rash.  Neurological: Positive for dizziness. Negative for tingling, focal weakness, weakness and headaches.  Endo/Heme/Allergies: Does not bruise/bleed easily.  Psychiatric/Behavioral: Negative for depression. The patient is not nervous/anxious and does not have insomnia.      MEDICAL HISTORY:  Past Medical History:  Diagnosis Date  . Adenocarcinoma of unknown primary ([place])   . Anxiety   . Breast cancer ([place])    pT2 pN0 cM0 (stage IIA) invasive mammary carcinoma of left outer breast status post lumpectomy and sentinel node study on July 14, 2010  . CAD (coronary artery disease)   . Depression   . H/O hypokalemia   . History of chemotherapy   . History of MI (myocardial infarction)   . Hyperglycemia   . Hyperlipidemia   . Hypertension   . Peripheral neuropathy due to chemotherapy ([place])   . Personal history of chemotherapy 2011   left breast ca  . Personal history of radiation therapy 2001   left breast ca    SURGICAL HISTORY: Past Surgical History:  Procedure Laterality Date  . ABDOMINAL HYSTERECTOMY    . BREAST BIOPSY Left 2008   neg  . BREAST BIOPSY Left 03/05/2010   invasive mammary carcinoma  . BREAST LUMPECTOMY Left 07/14/2010   INVASIVE MAMMARY CARCINOMA WITH PROMINENT INFILTRATIVE PATTERN, negative margins  . CHOLECYSTECTOMY    . COLONOSCOPY WITH PROPOFOL N/A 11/22/2015   Procedure: COLONOSCOPY WITH PROPOFOL;  Surgeon: Eddie Dibbles  Johnell Comings, MD;  Location: [facility] ENDOSCOPY;  Service: Gastroenterology;  Laterality: N/A;  . CORONARY ANGIOPLASTY  WITH STENT PLACEMENT  2008    SOCIAL HISTORY: Social History   Socioeconomic History  . Marital status: Divorced    Spouse name: Not on file  . Number of children: 3  . Years of education: some college, no degree  . Highest education level: 12th grade  Occupational History    Employer: RETIRED  Tobacco Use  . Smoking status: Former Smoker    Packs/day: 0.25    Years: 25.00    Pack years: 6.25    Types: Cigarettes    Quit date: 2008    Years since quitting: 14.2  . Smokeless tobacco: Never Used  . Tobacco comment: smoking cessation materials not required  Vaping Use  . Vaping Use: Never used  Substance and Sexual Activity  . Alcohol use: No    Comment: rare; holidays  . Drug use: No  . Sexual activity: Not Currently  Other Topics Concern  . Not on file  Social History Narrative   Pt lives alone   Social Determinants of Health   Financial Resource Strain: Low Risk   . Difficulty of Paying Living Expenses: Not hard at all  Food Insecurity: No Food Insecurity  . Worried About Charity fundraiser in the Last Year: Never true  . Ran Out of Food in the Last Year: Never true  Transportation Needs: No Transportation Needs  . Lack of Transportation (Medical): No  . Lack of Transportation (Non-Medical): No  Physical Activity: Inactive  . Days of Exercise per Week: 0 days  . Minutes of Exercise per Session: 0 min  Stress: No Stress Concern Present  . Feeling of Stress : Not at all  Social Connections: Socially Isolated  . Frequency of Communication with Friends and Family: More than three times a week  . Frequency of Social Gatherings with Friends and Family: Three times a week  . Attends Religious Services: Never  . Active Member of Clubs or Organizations: No  . Attends Archivist Meetings: Never  . Marital Status: Divorced  Human resources officer Violence: Not At Risk  . Fear of Current or Ex-Partner: No  . Emotionally Abused: No  . Physically Abused: No  .  Sexually Abused: No    FAMILY HISTORY: Family History  Problem Relation Age of Onset  . Breast cancer Sister 15  . Hypertension Mother   . Aneurysm Mother   . Heart attack Sister   . Birth defects Sister     ALLERGIES:  is allergic to tetanus toxoids and shellfish allergy.  MEDICATIONS:  Current Facility-Administered Medications  Medication Dose Route Frequency Provider Last Rate Last Admin  . acetaminophen (TYLENOL) tablet 650 mg  650 mg Oral Q4H PRN Agbata, Tochukwu, MD      . aspirin EC tablet 81 mg  81 mg Oral Daily Agbata, Tochukwu, MD   81 mg at 10/16/20 1013  . cholecalciferol (VITAMIN D3) tablet 1,000 Units  1,000 Units Oral Daily Agbata, Tochukwu, MD   1,000 Units at 10/16/20 1013  . feeding supplement (ENSURE ENLIVE / ENSURE PLUS) liquid 237 mL  237 mL Oral BID BM Agbata, Tochukwu, MD   237 mL at 10/16/20 1428  . hydrOXYzine (ATARAX/VISTARIL) tablet 25 mg  25 mg Oral TID PRN Sharen Hones, MD   25 mg at 10/16/20 1807  . lactulose (CHRONULAC) 10 GM/15ML solution 20 g  20 g Oral Daily PRN Collier Bullock, MD      .  metoprolol succinate (TOPROL-XL) 24 hr tablet 12.5 mg  12.5 mg Oral Daily Agbata, Tochukwu, MD   12.5 mg at 10/16/20 1013  . ondansetron (ZOFRAN) injection 4 mg  4 mg Intravenous Q6H PRN Agbata, Tochukwu, MD      . oxyCODONE-acetaminophen (PERCOCET/ROXICET) 5-325 MG per tablet 1 tablet  1 tablet Oral Q6H PRN Agbata, Tochukwu, MD   1 tablet at 10/16/20 1807   And  . oxyCODONE (Oxy IR/ROXICODONE) immediate release tablet 5 mg  5 mg Oral Q6H PRN Agbata, Tochukwu, MD   5 mg at 10/16/20 1807  . PARoxetine (PAXIL) tablet 10 mg  10 mg Oral Daily Agbata, Tochukwu, MD   10 mg at 10/16/20 1018  . polyethylene glycol (MIRALAX / GLYCOLAX) packet 17 g  17 g Oral Daily Agbata, Tochukwu, MD   17 g at 10/16/20 1018  . rosuvastatin (CRESTOR) tablet 10 mg  10 mg Oral Daily Agbata, Tochukwu, MD   10 mg at 10/16/20 1013      .  PHYSICAL EXAMINATION:  Vitals:   10/16/20 1643  10/16/20 1937  BP: 119/67 108/64  Pulse: 85 83  Resp:  15  Temp: 98.6 F (37 C) (!) 97.5 F (36.4 C)  SpO2: 98% 99%   Filed Weights   10/15/20 2245 10/16/20 0324 10/16/20 1025  Weight: 160 lb 4.4 oz (72.7 kg) 157 lb 10.1 oz (71.5 kg) 152 lb 8 oz (69.2 kg)    Physical Exam Constitutional:      Comments: Patient resting in the bed comfortably.  No acute distress  HENT:     Head: Normocephalic and atraumatic.     Mouth/Throat:     Pharynx: No oropharyngeal exudate.  Eyes:     Pupils: Pupils are equal, round, and reactive to light.  Cardiovascular:     Rate and Rhythm: Normal rate and regular rhythm.  Pulmonary:     Effort: No respiratory distress.     Breath sounds: No wheezing.     Comments: Decreased breath sounds bilaterally at the bases. Abdominal:     General: Bowel sounds are normal. There is no distension.     Palpations: Abdomen is soft. There is no mass.     Tenderness: There is no abdominal tenderness. There is no guarding or rebound.  Musculoskeletal:        General: No tenderness. Normal range of motion.     Cervical back: Normal range of motion and neck supple.  Skin:    General: Skin is warm.  Neurological:     Mental Status: She is alert and oriented to person, place, and time.  Psychiatric:        Mood and Affect: Affect normal.      LABORATORY DATA:  I have reviewed the data as listed Lab Results  Component Value Date   WBC 8.2 10/16/2020   HGB 9.5 (L) 10/16/2020   HCT 30.7 (L) 10/16/2020   MCV 84.3 10/16/2020   PLT 38 (L) 10/16/2020   Recent Labs    04/25/20 0852 05/29/20 1344 08/08/20 0000 08/08/20 0958 10/06/20 1419 10/09/20 0821 10/10/20 0523 10/12/20 0446 10/14/20 1448 10/15/20 1130  NA 141 142 141   < > 139 139   < > 137 138 136  K 2.8* 3.2* 3.5   < > 4.2 4.3   < > 4.2 4.7 4.8  CL 103 106 105   < > 108 107   < > 110 107 108  CO2 $Re'29 24 27   'TgW$ < > 22  24   < > 23 20* 19*  GLUCOSE 139* 115* 129*   < > 132* 119*   < > 109* 123* 123*   BUN 7* 9 9   < > 24* 28*   < > 32* 40* 38*  CREATININE 0.66 0.61 0.63   < > 0.72 0.70   < > 0.64 0.96 0.83  CALCIUM 9.3 10.1 10.3   < > 9.8 9.6   < > 8.6* 9.1 8.9  GFRNONAA >60 90 89   < > >60 >60   < > >60 >60 >60  GFRAA >60 104 103  --   --   --   --   --   --   --   PROT 7.3 6.6 7.3   < > 7.0 6.8  --   --  6.1*  --   ALBUMIN 3.6 3.9  --    < > 3.0* 3.0*  --   --  2.7*  --   AST $Re'20 27 22   'lyr$ < > 28 25  --   --  30  --   ALT $Re'15 22 16   'jPj$ < > 12 13  --   --  20  --   ALKPHOS 101 131*  --    < > 93 97  --   --  92  --   BILITOT 0.9 0.4 0.4   < > 0.9 0.6  --   --  0.8  --    < > = values in this interval not displayed.    RADIOGRAPHIC STUDIES: I have personally reviewed the radiological images as listed and agreed with the findings in the report. DG Chest 2 View  Result Date: 10/15/2020 CLINICAL DATA:  Chest pain for 30 minutes, upper chest pain. EXAM: CHEST - 2 VIEW COMPARISON:  October 09, 2020 FINDINGS: Trachea midline. Low lung volumes. Cardiomediastinal contours are stable and accentuated by low depth of expansion. Calcified atheromatous plaque in the thoracic aorta. Basilar opacities. Increased basilar opacification on the RIGHT as compared to the previous study. Persistent linear opacity in the RIGHT mid chest. Effusion seen on lateral radiograph with blunting of RIGHT costodiaphragmatic sulcus. Likely increased slightly since the prior study. On limited assessment no acute skeletal process. IMPRESSION: Small RIGHT effusion slightly increased with increased basilar opacity at the RIGHT lung base may reflect developing infection or worsening volume loss in the setting of diminished lung volumes. Electronically Signed   By: Zetta Bills M.D.   On: 10/15/2020 12:44   DG Chest 2 View  Result Date: 10/04/2020 CLINICAL DATA:  Status post right chest wall mass biopsy EXAM: CHEST - 2 VIEW COMPARISON:  Chest radiograph March 26, 2010 FINDINGS: The heart size and mediastinal contours are within  normal limits. Calcifications aortic arch. No visible pneumothorax. Right lower lobe airspace opacity. Small right pleural effusion. No visible pneumothorax. Thoracic spondylosis. IMPRESSION: Right lower lobe atelectasis with right pleural effusion. No visible pneumothorax. Electronically Signed   By: Dahlia Bailiff MD   On: 10/04/2020 12:36   CT Head Wo Contrast  Result Date: 10/03/2020 CLINICAL DATA:  Headache, intracranial hemorrhage suspected EXAM: CT HEAD WITHOUT CONTRAST TECHNIQUE: Contiguous axial images were obtained from the base of the skull through the vertex without intravenous contrast. COMPARISON:  10/01/2020 FINDINGS: Brain: No evidence of acute infarction, hemorrhage, hydrocephalus, extra-axial collection or mass lesion/mass effect. Mild periventricular and deep white matter hypodensity. Vascular: No hyperdense vessel or unexpected calcification. Skull: Normal. Negative for fracture or  focal lesion. Sinuses/Orbits: No acute finding. Other: None. IMPRESSION: 1. No acute intracranial pathology. No non-contrast CT findings to explain headache. Specifically, no evidence of intracranial hemorrhage. 2.  Small-vessel white matter disease. Electronically Signed   By: Lauralyn Primes M.D.   On: 10/03/2020 12:06   CT Head Wo Contrast  Result Date: 10/01/2020 CLINICAL DATA:  Altered mental status, unsteady gait EXAM: CT HEAD WITHOUT CONTRAST TECHNIQUE: Contiguous axial images were obtained from the base of the skull through the vertex without intravenous contrast. COMPARISON:  None. FINDINGS: Brain: No evidence of acute infarction, hemorrhage, hydrocephalus, extra-axial collection or mass lesion/mass effect. Scattered low-density changes within the periventricular and subcortical white matter compatible with chronic microvascular ischemic change. Mild diffuse cerebral volume loss. Vascular: Atherosclerotic calcifications involving the large vessels of the skull base. No unexpected hyperdense vessel. Skull:  Normal. Negative for fracture or focal lesion. Sinuses/Orbits: No acute finding. Other: None. IMPRESSION: 1. No acute intracranial findings. 2. Mild chronic microvascular ischemic change and cerebral volume loss. Electronically Signed   By: Duanne Guess D.O.   On: 10/01/2020 16:28   CT Head W or Wo Contrast  Result Date: 10/09/2020 CLINICAL DATA:  Mental status change EXAM: CT HEAD WITHOUT AND WITH CONTRAST TECHNIQUE: Contiguous axial images were obtained from the base of the skull through the vertex without and with intravenous contrast CONTRAST:  OMNIPAQUE IOHEXOL 300 MG/ML  SOLN COMPARISON:  10/03/2020 FINDINGS: Brain: There is no acute intracranial hemorrhage, mass, mass effect, or edema. No abnormal enhancement. Gray-white differentiation is preserved. There is no extra-axial fluid collection. Ventricles and sulci are stable in size and configuration. Patchy hypoattenuation in the supratentorial white matter is nonspecific but likely reflects stable mild chronic microvascular ischemic changes. Vascular: There is atherosclerotic calcification at the skull base. Skull: Calvarium is unremarkable. Sinuses/Orbits: No acute finding. Other: None. IMPRESSION: No acute intracranial abnormality.  No abnormal enhancement. Electronically Signed   By: Guadlupe Spanish M.D.   On: 10/09/2020 09:55   CT ABDOMEN PELVIS W CONTRAST  Result Date: 10/09/2020 CLINICAL DATA:  Confusion, abdominal bloating, pain EXAM: CT ABDOMEN AND PELVIS WITH CONTRAST TECHNIQUE: Multidetector CT imaging of the abdomen and pelvis was performed using the standard protocol following bolus administration of intravenous contrast. CONTRAST:  OMNIPAQUE IOHEXOL 300 MG/ML  SOLN COMPARISON:  10/06/2020 FINDINGS: Lower chest: Moderate right pleural effusion. Compressive atelectasis in the right lower lobe. Stable mass in a right lower costochondral junction is unchanged. Postsurgical changes in the left breast. This is unchanged.  Hepatobiliary: No change in the multiple masses throughout the liver presumably metastases. Dilated and thrombosed portal vein again noted with cavernous transformation, stable. Pancreas: Stable dilated pancreatic duct. Stable pancreatic body and pancreatic tail masses. Spleen: No focal abnormality.  Normal size. Adrenals/Urinary Tract: Stable low-density lesion off the upper pole of the left kidney measuring 1.8 cm. No hydronephrosis. Adrenal glands and urinary bladder unremarkable. Stomach/Bowel: Moderate stool throughout the colon. No evidence of bowel obstruction. Vascular/Lymphatic: Aortic atherosclerosis. Upper abdominal adenopathy again noted, the largest a portacaval node measuring up to 3.9 cm. Retroperitoneal adenopathy, stable. Reproductive: Prior hysterectomy Other: Large volume ascites in the abdomen and pelvis. Nodularity throughout the mesentery and omentum, most pronounced in the right abdomen and upper abdomen most likely reflects malignant ascites and peritoneal spread of disease. This may also be reflected within umbilical nodule, stable. Musculoskeletal: No acute bony abnormality or focal lesion. IMPRESSION: Extensive metastatic disease throughout the abdomen as described above. Findings stable since prior study. Chronic portal  vein thrombosis with cavernous transformation, stable. Upper abdominal and retroperitoneal adenopathy, stable. Large volume ascites, likely malignant ascites, stable. No evidence of bowel obstruction. Stable moderate right pleural effusion with right lower lobe atelectasis. Electronically Signed   By: Rolm Baptise M.D.   On: 10/09/2020 10:15   CT ABDOMEN PELVIS W CONTRAST  Result Date: 10/06/2020 CLINICAL DATA:  Abdominal swelling, vomiting, history of left breast cancer, metastatic disease on previous CT EXAM: CT ABDOMEN AND PELVIS WITH CONTRAST TECHNIQUE: Multidetector CT imaging of the abdomen and pelvis was performed using the standard protocol following bolus  administration of intravenous contrast. CONTRAST:  140mL OMNIPAQUE IOHEXOL 300 MG/ML  SOLN COMPARISON:  10/03/2020 FINDINGS: Lower chest: Stable right pleural effusion and right lower lobe atelectasis. The soft tissue mass at the right anterior sixth costochondral junction is unchanged, please correlate with recent biopsy results. Chronic postsurgical changes left breast. Hepatobiliary: No change in the multiple confluent hypodense masses within the liver consistent with presumed metastases. Dilated thrombosed portal vein again noted. Pancreas: There is marked pancreatic parenchymal atrophy. There are multiple pancreatic masses identified. In the pancreatic tail hypodense mass measures approximately 2.4 x 1.8 cm image 24/2. Hypodense mass in the dorsal aspect of the pancreatic body image 28/2 measures 2.2 x 1.9 cm, with hyperdense margins. Pancreatic duct dilation unchanged. Spleen: Spleen demonstrates normal size without focal abnormality. Splenic vein is patent. Adrenals/Urinary Tract: Indeterminate 1.8 cm hypodensity upper pole left kidney with Hounsfield attenuation of 43, stable. No other focal renal abnormalities. No renal tract calculi or obstructive uropathy. The bladder is decompressed with no focal abnormalities. The adrenals are normal. Stomach/Bowel: There is moderate gas and stool throughout the colon which may reflect an element of constipation. No evidence of bowel obstruction or ileus. Vascular/Lymphatic: Extensive atherosclerosis of the aorta is again noted unchanged. Chronic portal vein thrombosis with cavernous transformation again seen. The splenic vein and superior mesenteric vein are patent. Pathologic adenopathy is seen throughout the upper abdomen. 10 mm lymph node in the porta hepatis image 25/2 is stable. Multiple enlarged lymph nodes are seen within the gastrohepatic ligament, largest measuring 11 mm in short axis image 23/2. Necrotic lymph node dorsal to the pancreatic body image 28/2  measures 19 mm in short axis. Aortocaval lymph node image 36/2 measures 10 mm in short axis. Reproductive: Status post hysterectomy. No adnexal masses. Other: Moderate abdominal and pelvic ascites, without appreciable change since prior study. There is some soft tissue nodularity within the right upper quadrant mesentery, just inferior to the liver and along the hepatic flexure of the colon, concerning for peritoneal implant. No free intraperitoneal gas. Stable umbilical hernia containing mesenteric fat. Inflammatory changes within the hernia may suggest incarceration. Musculoskeletal: No acute or destructive bony lesions. Reconstructed images demonstrate no additional findings. IMPRESSION: 1. Extensive intra-abdominal and intrapelvic metastases, without significant change since prior study. Given the above findings, this may reflect primary pancreatic adenocarcinoma with diffuse metastases. Correlation with recent biopsy results recommended. No significant change in the appearance of the metastatic disease since prior study. 2. Moderate ascites, without significant change since prior study. This is likely malignant ascites given the soft tissue nodularity within the right upper quadrant mesentery. 3. Chronic portal vein thrombosis with cavernous transformation. 4. No evidence of bowel obstruction or ileus. Retained gas and stool throughout the colon may reflect constipation. 5. Stable right pleural effusion. 6.  Aortic Atherosclerosis (ICD10-I70.0). Electronically Signed   By: Randa Ngo M.D.   On: 10/06/2020 19:51   CT ABDOMEN PELVIS  W CONTRAST  Addendum Date: 10/03/2020   ADDENDUM REPORT: 10/03/2020 14:10 ADDENDUM: ,There is soft tissue fullness along the anterior right lower rib cartilage, including on 14/2. This is new since 02/19/2020, also highly suspicious for metastatic disease. Electronically Signed   By: Abigail Miyamoto M.D.   On: 10/03/2020 14:10   Result Date: 10/03/2020 CLINICAL DATA:   Right-sided abdominal pain for about a week with nausea and vomiting x2. Constipation. Remote breast cancer history. EXAM: CT ABDOMEN AND PELVIS WITH CONTRAST TECHNIQUE: Multidetector CT imaging of the abdomen and pelvis was performed using the standard protocol following bolus administration of intravenous contrast. CONTRAST:  129mL OMNIPAQUE IOHEXOL 300 MG/ML  SOLN COMPARISON:  Multiple priors, including MRCP of 09/26/2020, abdominal CT of 02/19/2020, abdominal MRI 03/07/2020. FINDINGS: Lower chest: Right base atelectasis. Small right pleural effusion is new since the prior CT. Normal heart size with multivessel coronary artery atherosclerosis. Left-sided calcification or surgical clips. Deep to the presumed lumpectomy site is a centrally hypoattenuating lesion of 2.8 x 2.2 cm on 05/02. This is likely not imaged on the prior CT, but present on the 03/07/2020 MRI where it was grossly similar. Hepatobiliary: Multiple hepatic masses are again identified. A high right hepatic lobe lesion measures 2.6 cm on 13/2 versus 1.8 cm on 02/19/2020. A wedge-shaped mass within segments 4 and 2 of the liver measures on the order of 10.5 x 5.1 cm on 19/2. Significantly enlarged from 02/19/2020, where lesions in this region measured up to 3.0 cm. Increased from on the order of 3.3 cm on 03/07/2020. Cholecystectomy without biliary duct dilatation. Pancreas: The pancreas demonstrates similar atrophy with moderate duct dilatation at 9 mm on 29/2. Duct dilatation is followed to the level of the pancreatic head, with there is mild soft tissue fullness but no well-defined mass. Example 34/2. This is unchanged. The pancreatic tail demonstrates developing relative soft tissue fullness compared to the pancreatic body. Example at on the order of 2.3 cm in thickness on 25/2 versus 1.4 cm on 02/19/2020. Spleen: Normal in size, without focal abnormality. Adrenals/Urinary Tract: Normal adrenal glands. Upper pole left renal lesion of 1.6 cm is  greater than fluid density but was determined a complex cyst on prior MRI. Normal right kidney. Contrast in the urinary bladder secondary to reported outside CT performed yesterday. Stomach/Bowel: Tiny hiatal hernia. Normal distal stomach. Moderate amount of stool within the rectum. Scattered colonic diverticula. Normal terminal ileum. Normal small bowel caliber. Vascular/Lymphatic: Aortic atherosclerosis. Diffuse portal vein thrombosis again identified. Thrombus involves the superior aspect of the superior mesenteric vein, chronic. New and progressive abdominal adenopathy. An aortocaval node measures 1.0 cm on 39/2 versus 7 mm on 02/19/2020 (when remeasured). A necrotic node along the celiac, just posterior to the pancreatic body measures 1.9 cm on 30/2 and is new. Developing adenopathy in the gastrohepatic ligament including at 1.0 cm on 24/2. More ill-defined hypoattenuating lesions in the peripancreatic space, including at 2.6 cm in the portacaval space on 29/2, increased from 2.3 cm on 02/19/2020. No pelvic sidewall adenopathy. Reproductive: Hysterectomy.  No adnexal mass. Other: Since 02/19/2020, development of small volume abdominopelvic ascites. Peritoneal nodularity, including within the pelvic cul-de-sac on 78/2 and along the right-side of the abdomen on 39/2. No free intraperitoneal air. A new periumbilical nodule or node measures 1.9 cm on 57/2. Musculoskeletal: No acute osseous abnormality. Trace L4-5 anterolisthesis. Degenerate disc disease at the lumbosacral junction. IMPRESSION: 1. When compared to multiple prior exams, including most direct comparison to the 02/19/2020 CT, new or  progressive metastatic disease, most definite involving necrotic abdominal nodes and the peritoneum as detailed above. Developing periumbilical nodularity, also likely metastatic. 2. Progressive liver lesions, also most likely indicative of metastatic disease. In the setting of portal vein thrombosis, progressive abscesses  are possible but less likely. 3. Abnormal appearance of the pancreas, at least partially felt to be attributed to chronic pancreatitis. Developing soft tissue fullness within the pancreatic tail, with considerations of primary adenocarcinoma, metastatic disease, or superimposed acute pancreatitis. Peripancreatic hypoattenuating lesions, some of which are increased compared to 02/19/2020. These could be secondary to prior pancreatitis or also represent necrotic nodal metastasis. 4. Left breast mass in the setting of prior lumpectomy for carcinoma. Suspicious for new breast primary, incompletely imaged. 5.  Possible constipation. 6. New small volume abdominopelvic ascites. 7. Similar small right pleural effusion compared to the MRI of 09/26/2020. Electronically Signed: By: Abigail Miyamoto M.D. On: 10/03/2020 12:32   MR ABDOMEN MRCP WO CONTRAST  Result Date: 09/27/2020 CLINICAL DATA:  Pancreatitis. Pancreatic and liver lesions on prior MRI. Abdominal pain. EXAM: MRI ABDOMEN WITHOUT CONTRAST  (INCLUDING MRCP) TECHNIQUE: Multiplanar multisequence MR imaging of the abdomen was performed. Heavily T2-weighted images of the biliary and pancreatic ducts were obtained, and three-dimensional MRCP images were rendered by post processing. COMPARISON:  03/07/2020 MRI FINDINGS: Despite efforts by the technologist and patient, severe motion artifact is present on today's exam and could not be eliminated. This reduces exam sensitivity and specificity. Due to pain, the patient terminated the exam prior to obtaining full diagnostic sequences. Lower chest: Small right pleural effusion is new compared to prior MRI. Hepatobiliary: There is been evolution of the previous regions of stippled T2 signal hyperintensity scattered in the liver to current appearance of hazy accentuated T2 signal for example on image 9 of series 4. On biopsy, these were felt to represent areas of periductal fibrosis, chronic lymphoplasmacytic infiltrate,  hemosiderin deposition, and regenerative hyperplasia suggesting finding secondary to portal vein thrombosis. This process is significantly expanded throughout segment 4a and 4B of the liver which not demonstrates diffuse hazy accentuated T2 signal as shown on image 14 series 4. There is associated reduced T1 signal in these regions of involvement. No steatosis. There is some associated restriction of diffusion in these regions of involvement. Unfortunately the MRCP images are severely blurred. No obvious extrahepatic biliary dilatation. There is some mild narrowing of the common hepatic duct along the porta hepatis for example on image 16 series 3, probably due to surrounding soft tissue swelling and possibly periportal fibrosis. As before, there is high T2 signal intensity material throughout the somewhat dilated intrahepatic portal venous system probably from thrombosis. Periportal edema noted. Pancreas: Peripancreatic edema noted with stippled accentuated T2 signal in the pancreas, and substantially dilated segments of the dorsal pancreatic duct especially in the pancreatic body, up to 1.0 cm in diameter. T2 signal hyperintensity partially involving the pancreatic head measuring about 4.5 by 1.6 by 2.6 cm, extending cephalad along the porta hepatis, I am uncertain whether this represents a thrombosed varix from the portal vein, pseudocyst, or a cystic pancreatic neoplasm. This appears roughly similar to the prior exam. Spleen:  Unremarkable Adrenals/Urinary Tract: Small renal lesions of varying complexity are present and probably a combination of simple and complex cysts, although enhancement characteristics are not interrogated today. The adrenal glands appear normal. Stomach/Bowel: Unremarkable Vascular/Lymphatic: Aortoiliac atherosclerotic vascular disease. High suspicion for continued portal vein thrombosis, with narrowing of the portal vein along the pancreatic head, and probable splenic vein  thrombosis.  Collaterals along the porta hepatis. Other: Ascites particularly along the paracolic gutters and in the pelvis (pelvic ascites seen on coronal images). Mesenteric edema mildly improved in the central mesentery compared to previous. Omental edema noted. There is hazy stranding in the root of the mesentery as on prior exams. Musculoskeletal: Lower lumbar spondylosis and degenerative disc disease. IMPRESSION: 1. Despite efforts by the technologist and patient, severe motion artifact is present on today's exam and could not be eliminated. The patient was also unable to complete the full sequence of the exam. This reduces exam sensitivity and specificity. 2. Evolution of the appearance in the liver with the previously stippled T2 signal hyperintensities now appearing as hazy increased T2 signal, and with substantially increased involvement throughout segment 4 and 4B of the liver. Based on prior biopsy results this likely represents combination of fibrosis, lymphoplasmacytic infiltrate, hemosiderin deposition, and periportal hyperplasia resulting from portal vein thrombosis. 3. 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 No obvious extrahepatic biliary dilatation. There is some mild narrowing of the common hepatic duct along the porta hepatis, probably due to surrounding soft tissue swelling and possibly periportal fibrosis. 4. Peripancreatic edema with stippled accentuated T2 signal in the pancreas, and substantially dilated segments of the dorsal pancreatic duct especially in the pancreatic body. 5. Lobulated T2 signal hyperintensity along the pancreatic head, possibly a pseudocyst or a thrombosed varix of the portal vein, less likely cystic pancreatic neoplasm. 6. Small right pleural effusion is new compared to previous MRI. 7. Reduced central mesenteric edema compared to previous, although there is still  some peripancreatic edema as well as mesenteric edema and ascites. 8. Lower lumbar spondylosis and degenerative disc disease. Electronically Signed   By: Van Clines M.D.   On: 09/27/2020 16:11   US Paracentesis  Result Date: 10/09/2020 INDICATION: 75 year old female with history of breast cancer and abdominal pain found to have large volume ascites. EXAM: ULTRASOUND GUIDED left lower quadrant PARACENTESIS MEDICATIONS: None. COMPLICATIONS: None immediate. PROCEDURE: Informed written consent was obtained from the patient after a discussion of the risks, benefits and alternatives to treatment. A timeout was performed prior to the initiation of the procedure. Initial ultrasound scanning demonstrates a large amount of ascites within the left lower abdominal quadrant. The right lower abdomen was prepped and draped in the usual sterile fashion. 1% lidocaine was used for local anesthesia. Following this, a 6 Fr Safe-T-Centesis catheter was introduced. An ultrasound image was saved for documentation purposes. The paracentesis was performed. The catheter was removed and a dressing was applied. The patient tolerated the procedure well without immediate post procedural complication. FINDINGS: A total of approximately 3.05 L of translucent, straw-colored fluid was removed. Samples were sent to the laboratory as requested by the clinical team. IMPRESSION: Successful ultrasound-guided paracentesis yielding 3.05 liters of peritoneal fluid. Ruthann Cancer, MD [facility] Electronically Signed   By: Ruthann Cancer MD   On: 10/09/2020 15:53   DG Chest Portable 1 View  Result Date: 10/09/2020 CLINICAL DATA:  Altered mental status.  Rule out pneumonia EXAM: PORTABLE CHEST 1 VIEW COMPARISON:  10/04/2020 FINDINGS: Progression of right lower lobe airspace disease.  No effusion Left lung remains clear.  Negative for heart failure. IMPRESSION: Progression of right lower  lobe airspace disease which may represent atelectasis or pneumonia. Electronically Signed   By: Franchot Gallo M.D.   On: 10/09/2020  10:11   ECHOCARDIOGRAM COMPLETE  Result Date: 10/16/2020    ECHOCARDIOGRAM REPORT   Patient Name:   Theresa Andrews Date of Exam: 10/16/2020 Medical Rec #:  [id number]      Height:       64.0 in Accession #:    [id number]     Weight:       157.6 lb Date of Birth:  [birth year]       BSA:          1.768 m Patient Age:    75 years       BP:           136/69 mmHg Patient Gender: F              HR:           88 bpm. Exam Location:  [facility] Procedure: 2D Echo, Color Doppler, Cardiac Doppler and Intracardiac            Opacification Agent Indications:     I21.4 NSTEMI; R07.9 Chest Pain  History:         Patient has no prior history of Echocardiogram examinations.                  Previous Myocardial Infarction and CAD; Risk                  Factors:Hypertension and Dyslipidemia. Hx of chemotherapy.  Sonographer:     Charmayne Sheer RDCS (AE) Referring Phys:  [id number] Collier Bullock Diagnosing Phys: Bartholome Bill MD  Sonographer Comments: Suboptimal apical window and suboptimal subcostal window. IMPRESSIONS  1. Left ventricular ejection fraction, by estimation, is 65 to 70%. The left ventricle has normal function. The left ventricle has no regional wall motion abnormalities. Left ventricular diastolic parameters are consistent with Grade I diastolic dysfunction (impaired relaxation).  2. Right ventricular systolic function is normal. The right ventricular size is normal.  3. The mitral valve is grossly normal. Mild mitral valve regurgitation.  4. The aortic valve is tricuspid. Aortic valve regurgitation is trivial. FINDINGS  Left Ventricle: Left ventricular ejection fraction, by estimation, is 65 to 70%. The left ventricle has normal function. The left ventricle has no regional wall motion abnormalities. Definity contrast agent was given IV to delineate the left ventricular  endocardial borders. The left  ventricular internal cavity size was normal in size. There is no left ventricular hypertrophy. Left ventricular diastolic parameters are consistent with Grade I diastolic dysfunction (impaired relaxation). Right Ventricle: The right ventricular size is normal. No increase in right ventricular wall thickness. Right ventricular systolic function is normal. Left Atrium: Left atrial size was normal in size. Right Atrium: Right atrial size was normal in size. Pericardium: There is no evidence of pericardial effusion. Mitral Valve: The mitral valve is grossly normal. Mild mitral valve regurgitation. MV peak gradient, 3.6 mmHg. The mean mitral valve gradient is 1.0 mmHg. Tricuspid Valve: The tricuspid valve is grossly normal. Tricuspid valve regurgitation is mild. Aortic Valve: The aortic valve is tricuspid. Aortic valve regurgitation is trivial. Aortic valve mean gradient measures 4.0 mmHg. Aortic valve peak gradient measures 7.4 mmHg. Aortic valve area, by VTI measures 1.76 cm. Pulmonic Valve: The pulmonic valve was not well visualized. Pulmonic valve regurgitation is trivial. Aorta: The aortic root is normal in size and structure. IAS/Shunts: The interatrial septum was not assessed.  LEFT VENTRICLE PLAX 2D LVIDd:         3.40 cm  Diastology LVIDs:  2.10 cm  LV e' medial:    7.07 cm/s LV PW:         0.90 cm  LV E/e' medial:  8.0 LV IVS:        0.70 cm  LV e' lateral:   8.70 cm/s LVOT diam:     1.60 cm  LV E/e' lateral: 6.5 LV SV:         38 LV SV Index:   22 LVOT Area:     2.01 cm  RIGHT VENTRICLE RV Basal diam:  2.60 cm LEFT ATRIUM             Index LA diam:        3.00 cm 1.70 cm/m LA Vol (A2C):   29.6 ml 16.74 ml/m LA Vol (A4C):   27.0 ml 15.27 ml/m LA Biplane Vol: 28.2 ml 15.95 ml/m  AORTIC VALVE                   PULMONIC VALVE AV Area (Vmax):    1.55 cm    PV Vmax:       1.22 m/s AV Area (Vmean):   1.57 cm    PV Vmean:      86.600 cm/s AV Area (VTI):     1.76 cm    PV VTI:        0.203 m AV Vmax:            136.00 cm/s PV Peak grad:  6.0 mmHg AV Vmean:          95.500 cm/s PV Mean grad:  3.0 mmHg AV VTI:            0.217 m AV Peak Grad:      7.4 mmHg AV Mean Grad:      4.0 mmHg LVOT Vmax:         105.00 cm/s LVOT Vmean:        74.400 cm/s LVOT VTI:          0.190 m LVOT/AV VTI ratio: 0.88  AORTA Ao Root diam: 2.80 cm MITRAL VALVE MV Area (PHT): 2.77 cm    SHUNTS MV Area VTI:   2.03 cm    Systemic VTI:  0.19 m MV Peak grad:  3.6 mmHg    Systemic Diam: 1.60 cm MV Mean grad:  1.0 mmHg MV Vmax:       0.95 m/s MV Vmean:      49.6 cm/s MV Decel Time: 274 msec MV E velocity: 56.60 cm/s MV A velocity: 81.40 cm/s MV E/A ratio:  0.70 Bartholome Bill MD Electronically signed by Bartholome Bill MD Signature Date/Time: 10/16/2020/10:09:22 AM    Final    [place] CORE BIOPSY (SOFT TISSUE)  Result Date: 10/04/2020 INDICATION: 75 year old woman with history of breast cancer presented to interventional radiology for biopsy of new right chest wall mass. EXAM: Ultrasound-guided biopsy of right chest wall mass MEDICATIONS: None. ANESTHESIA/SEDATION: Moderate (conscious) sedation was employed during this procedure. A total of Versed 2 mg and Fentanyl 100 mcg was administered intravenously. Moderate Sedation Time: 8 minutes. The patient's level of consciousness and vital signs were monitored continuously by radiology nursing throughout the procedure under my direct supervision. COMPLICATIONS: None immediate. PROCEDURE: Informed written consent was obtained from the patient after a thorough discussion of the procedural risks, benefits and alternatives. All questions were addressed. Maximal Sterile Barrier Technique was utilized including caps, mask, sterile gowns, sterile gloves, sterile drape, hand hygiene and skin antiseptic. A timeout was performed prior to  the initiation of the procedure. Patient position supine on the ultrasound table. Right anterior lower chest wall skin prepped and draped in usual sterile fashion. Following local  lidocaine administration, 18 gauge biopsy needle was advanced into the right anterior chest wall mass, and four cores were obtained utilizing continuous ultrasound guidance. Samples were sent to pathology in formalin. Needle removed and hemostasis achieved with 5 minutes of manual compression. Post procedure ultrasound images showed no evidence of significant hemorrhage. IMPRESSION: Ultrasound-guided biopsy of right anterior chest wall mass. Electronically Signed   By: Miachel Roux M.D.   On: 10/04/2020 17:04   [place] EKG SITE RITE  Result Date: 10/15/2020 If [facility] image not attached, placement could not be confirmed due to current cardiac rhythm.   Carcinoma of unknown primary [facility]) #75 year old female patient with a history of carcinoma of unknown primary is currently admitted to hospital for chest pain-noted to have thrombocytopenia:  #Carcinoma of unknown primary-stage IV -possible pancreatic versus left breast/recurrence-currently awaiting ER/PR HER-2/neu status on the chest wall soft tissue specimen.  Currently s/p carbotaxol cycle #1; day-6. Left breast mass-question recurrent malignancy-we will plan outpatient biopsy.  #Thrombocytopenia platelets 38-likely secondary chemotherapy.  I expect platelets to improve starting in the next few days.  #Chest pain/non-STEMI/elevated troponins.  #Portal vein thrombosis-anticoagulation currently on hold.   #DNR/DNI  Recommendations:  #Appreciate cardiac evaluation; await 2D echo.  From hematology standpoint-okay to continue with aspirin 81 mg in the context of low platelets-38.  Discussed with Dr. Ubaldo Glassing.  # It is reasonable to hold anticoagulation/Lovenox-the context of thrombocytopenia.  Recommend restarting Lovenox-if platelets greater than 50,000.  Thank you Dr.Agbata for allowing me to participate in the care of your pleasant patient. Please do not hesitate to contact me with questions or concerns in the interim.    All questions were  answered. The patient knows to call the clinic with any problems, questions or concerns.       Cammie Sickle, MD 10/16/2020 8:31 PM

## 2020-10-16 NOTE — Progress Notes (Signed)
Patient Name: Theresa Andrews Date of Encounter: 10/16/2020  Hospital Problem List     Principal Problem:   NSTEMI (non-ST elevated myocardial infarction) [facility]) Active Problems:   Atherosclerosis of coronary artery   Benign essential HTN   Portal vein thrombosis   Carcinoma of unknown primary [facility])   Depression    Patient Profile      75 y.o. female with history of significant for breast carcinoma status post lumpectomy radiation and chemotherapy, history of dyslipidemia, history of portal vein thrombosis who presented to the emergency room complaining of chest pain.  She recently received chemotherapy including carboplatin and paclitaxel.  1 day prior to admission she was seen for poor oral intake and weakness and fatigue.  She was hypotensive in the office and received IV fluids and held her blood pressure medications.  She was seen in the cancer center today for follow-up and complained of midsternal chest pain.  She had no shortness of breath.  She was referred to the emergency room where laboratory showed a potassium of 4.8 creatinine of 0.83 troponin IV 131 and 400.  Hemoglobin was normal.  EKG showed sinus rhythm with nonspecific ST-T wave changes.  No acute ischemia.  Chest x-ray revealed a small right pleural effusion.  Symptoms have improved since presenting to the emergency room.  She is currently being treated with enteric-coated aspirin.  Metoprolol succinate 25 daily, rosuvastatin 10 mg daily.  Subjective   Denies chest pain  Inpatient Medications    . aspirin EC  81 mg Oral Daily  . cholecalciferol  1,000 Units Oral Daily  . feeding supplement  237 mL Oral BID BM  . metoprolol succinate  12.5 mg Oral Daily  . PARoxetine  10 mg Oral Daily  . polyethylene glycol  17 g Oral Daily  . rosuvastatin  10 mg Oral Daily    Vital Signs    Vitals:   10/16/20 0324 10/16/20 0740 10/16/20 1025 10/16/20 1120  BP: 118/68 136/69  125/67  Pulse: 88 86  81  Resp: 17 16    Temp:  97.9 F (36.6 C) 98.6 F (37 C)  98.4 F (36.9 C)  TempSrc: Oral Oral  Oral  SpO2: 98% 99%  99%  Weight: 71.5 kg  69.2 kg   Height:        Intake/Output Summary (Last 24 hours) at 10/16/2020 1206 Last data filed at 10/15/2020 2245 Gross per 24 hour  Intake 0 ml  Output 0 ml  Net 0 ml   Filed Weights   10/15/20 2245 10/16/20 0324 10/16/20 1025  Weight: 72.7 kg 71.5 kg 69.2 kg    Physical Exam    GEN: Well nourished, well developed, in no acute distress.  HEENT: normal.  Neck: Supple, no JVD, carotid bruits, or masses. Cardiac: RRR, no murmurs, rubs, or gallops. No clubbing, cyanosis, edema.  Radials/DP/PT 2+ and equal bilaterally.  Respiratory:  Respirations regular and unlabored, clear to auscultation bilaterally. GI: Soft, nontender, nondistended, BS + x 4. MS: no deformity or atrophy. Skin: warm and dry, no rash. Neuro:  Strength and sensation are intact. Psych: Normal affect.  Labs    CBC Recent Labs    10/14/20 1448 10/15/20 1130 10/16/20 0602  WBC 14.2* 11.4* 8.2  NEUTROABS 13.1*  --   --   HGB 10.4* 9.9* 9.5*  HCT 33.6* 31.0* 30.7*  MCV 84.4 81.6 84.3  PLT PLATELET CLUMPS NOTED ON SMEAR, UNABLE TO ESTIMATE PLATELET CLUMPS NOTED ON SMEAR, UNABLE TO ESTIMATE  38*   Basic Metabolic Panel Recent Labs    10/14/20 1448 10/15/20 1130  NA 138 136  K 4.7 4.8  CL 107 108  CO2 20* 19*  GLUCOSE 123* 123*  BUN 40* 38*  CREATININE 0.96 0.83  CALCIUM 9.1 8.9   Liver Function Tests Recent Labs    10/14/20 1448  AST 30  ALT 20  ALKPHOS 92  BILITOT 0.8  PROT 6.1*  ALBUMIN 2.7*   Recent Labs    10/15/20 1130  LIPASE 39   Cardiac Enzymes No results for input(s): CKTOTAL, CKMB, CKMBINDEX, TROPONINI in the last 72 hours. BNP No results for input(s): BNP in the last 72 hours. D-Dimer No results for input(s): DDIMER in the last 72 hours. Hemoglobin A1C No results for input(s): HGBA1C in the last 72 hours. Fasting Lipid Panel No results for input(s):  CHOL, HDL, LDLCALC, TRIG, CHOLHDL, LDLDIRECT in the last 72 hours. Thyroid Function Tests No results for input(s): TSH, T4TOTAL, T3FREE, THYROIDAB in the last 72 hours.  Invalid input(s): FREET3  Telemetry    Normal sinus rhythm  ECG    Normal sinus rhythm with no ischemia  Radiology    DG Chest 2 View  Result Date: 10/15/2020 CLINICAL DATA:  Chest pain for 30 minutes, upper chest pain. EXAM: CHEST - 2 VIEW COMPARISON:  October 09, 2020 FINDINGS: Trachea midline. Low lung volumes. Cardiomediastinal contours are stable and accentuated by low depth of expansion. Calcified atheromatous plaque in the thoracic aorta. Basilar opacities. Increased basilar opacification on the RIGHT as compared to the previous study. Persistent linear opacity in the RIGHT mid chest. Effusion seen on lateral radiograph with blunting of RIGHT costodiaphragmatic sulcus. Likely increased slightly since the prior study. On limited assessment no acute skeletal process. IMPRESSION: Small RIGHT effusion slightly increased with increased basilar opacity at the RIGHT lung base may reflect developing infection or worsening volume loss in the setting of diminished lung volumes. Electronically Signed   By: Zetta Bills M.D.   On: 10/15/2020 12:44   DG Chest 2 View  Result Date: 10/04/2020 CLINICAL DATA:  Status post right chest wall mass biopsy EXAM: CHEST - 2 VIEW COMPARISON:  Chest radiograph March 26, 2010 FINDINGS: The heart size and mediastinal contours are within normal limits. Calcifications aortic arch. No visible pneumothorax. Right lower lobe airspace opacity. Small right pleural effusion. No visible pneumothorax. Thoracic spondylosis. IMPRESSION: Right lower lobe atelectasis with right pleural effusion. No visible pneumothorax. Electronically Signed   By: Dahlia Bailiff MD   On: 10/04/2020 12:36   CT Head Wo Contrast  Result Date: 10/03/2020 CLINICAL DATA:  Headache, intracranial hemorrhage suspected EXAM: CT HEAD  WITHOUT CONTRAST TECHNIQUE: Contiguous axial images were obtained from the base of the skull through the vertex without intravenous contrast. COMPARISON:  10/01/2020 FINDINGS: Brain: No evidence of acute infarction, hemorrhage, hydrocephalus, extra-axial collection or mass lesion/mass effect. Mild periventricular and deep white matter hypodensity. Vascular: No hyperdense vessel or unexpected calcification. Skull: Normal. Negative for fracture or focal lesion. Sinuses/Orbits: No acute finding. Other: None. IMPRESSION: 1. No acute intracranial pathology. No non-contrast CT findings to explain headache. Specifically, no evidence of intracranial hemorrhage. 2.  Small-vessel white matter disease. Electronically Signed   By: Eddie Candle M.D.   On: 10/03/2020 12:06   CT Head Wo Contrast  Result Date: 10/01/2020 CLINICAL DATA:  Altered mental status, unsteady gait EXAM: CT HEAD WITHOUT CONTRAST TECHNIQUE: Contiguous axial images were obtained from the base of the skull through the vertex  without intravenous contrast. COMPARISON:  None. FINDINGS: Brain: No evidence of acute infarction, hemorrhage, hydrocephalus, extra-axial collection or mass lesion/mass effect. Scattered low-density changes within the periventricular and subcortical white matter compatible with chronic microvascular ischemic change. Mild diffuse cerebral volume loss. Vascular: Atherosclerotic calcifications involving the large vessels of the skull base. No unexpected hyperdense vessel. Skull: Normal. Negative for fracture or focal lesion. Sinuses/Orbits: No acute finding. Other: None. IMPRESSION: 1. No acute intracranial findings. 2. Mild chronic microvascular ischemic change and cerebral volume loss. Electronically Signed   By: Davina Poke D.O.   On: 10/01/2020 16:28   CT Head W or Wo Contrast  Result Date: 10/09/2020 CLINICAL DATA:  Mental status change EXAM: CT HEAD WITHOUT AND WITH CONTRAST TECHNIQUE: Contiguous axial images were obtained  from the base of the skull through the vertex without and with intravenous contrast CONTRAST:  139mL OMNIPAQUE IOHEXOL 300 MG/ML  SOLN COMPARISON:  10/03/2020 FINDINGS: Brain: There is no acute intracranial hemorrhage, mass, mass effect, or edema. No abnormal enhancement. Gray-white differentiation is preserved. There is no extra-axial fluid collection. Ventricles and sulci are stable in size and configuration. Patchy hypoattenuation in the supratentorial white matter is nonspecific but likely reflects stable mild chronic microvascular ischemic changes. Vascular: There is atherosclerotic calcification at the skull base. Skull: Calvarium is unremarkable. Sinuses/Orbits: No acute finding. Other: None. IMPRESSION: No acute intracranial abnormality.  No abnormal enhancement. Electronically Signed   By: Macy Mis M.D.   On: 10/09/2020 09:55   CT ABDOMEN PELVIS W CONTRAST  Result Date: 10/09/2020 CLINICAL DATA:  Confusion, abdominal bloating, pain EXAM: CT ABDOMEN AND PELVIS WITH CONTRAST TECHNIQUE: Multidetector CT imaging of the abdomen and pelvis was performed using the standard protocol following bolus administration of intravenous contrast. CONTRAST:  152mL OMNIPAQUE IOHEXOL 300 MG/ML  SOLN COMPARISON:  10/06/2020 FINDINGS: Lower chest: Moderate right pleural effusion. Compressive atelectasis in the right lower lobe. Stable mass in a right lower costochondral junction is unchanged. Postsurgical changes in the left breast. This is unchanged. Hepatobiliary: No change in the multiple masses throughout the liver presumably metastases. Dilated and thrombosed portal vein again noted with cavernous transformation, stable. Pancreas: Stable dilated pancreatic duct. Stable pancreatic body and pancreatic tail masses. Spleen: No focal abnormality.  Normal size. Adrenals/Urinary Tract: Stable low-density lesion off the upper pole of the left kidney measuring 1.8 cm. No hydronephrosis. Adrenal glands and urinary bladder  unremarkable. Stomach/Bowel: Moderate stool throughout the colon. No evidence of bowel obstruction. Vascular/Lymphatic: Aortic atherosclerosis. Upper abdominal adenopathy again noted, the largest a portacaval node measuring up to 3.9 cm. Retroperitoneal adenopathy, stable. Reproductive: Prior hysterectomy Other: Large volume ascites in the abdomen and pelvis. Nodularity throughout the mesentery and omentum, most pronounced in the right abdomen and upper abdomen most likely reflects malignant ascites and peritoneal spread of disease. This may also be reflected within umbilical nodule, stable. Musculoskeletal: No acute bony abnormality or focal lesion. IMPRESSION: Extensive metastatic disease throughout the abdomen as described above. Findings stable since prior study. Chronic portal vein thrombosis with cavernous transformation, stable. Upper abdominal and retroperitoneal adenopathy, stable. Large volume ascites, likely malignant ascites, stable. No evidence of bowel obstruction. Stable moderate right pleural effusion with right lower lobe atelectasis. Electronically Signed   By: Rolm Baptise M.D.   On: 10/09/2020 10:15   CT ABDOMEN PELVIS W CONTRAST  Result Date: 10/06/2020 CLINICAL DATA:  Abdominal swelling, vomiting, history of left breast cancer, metastatic disease on previous CT EXAM: CT ABDOMEN AND PELVIS WITH CONTRAST TECHNIQUE: Multidetector CT  imaging of the abdomen and pelvis was performed using the standard protocol following bolus administration of intravenous contrast. CONTRAST:  16mL OMNIPAQUE IOHEXOL 300 MG/ML  SOLN COMPARISON:  10/03/2020 FINDINGS: Lower chest: Stable right pleural effusion and right lower lobe atelectasis. The soft tissue mass at the right anterior sixth costochondral junction is unchanged, please correlate with recent biopsy results. Chronic postsurgical changes left breast. Hepatobiliary: No change in the multiple confluent hypodense masses within the liver consistent with  presumed metastases. Dilated thrombosed portal vein again noted. Pancreas: There is marked pancreatic parenchymal atrophy. There are multiple pancreatic masses identified. In the pancreatic tail hypodense mass measures approximately 2.4 x 1.8 cm image 24/2. Hypodense mass in the dorsal aspect of the pancreatic body image 28/2 measures 2.2 x 1.9 cm, with hyperdense margins. Pancreatic duct dilation unchanged. Spleen: Spleen demonstrates normal size without focal abnormality. Splenic vein is patent. Adrenals/Urinary Tract: Indeterminate 1.8 cm hypodensity upper pole left kidney with Hounsfield attenuation of 43, stable. No other focal renal abnormalities. No renal tract calculi or obstructive uropathy. The bladder is decompressed with no focal abnormalities. The adrenals are normal. Stomach/Bowel: There is moderate gas and stool throughout the colon which may reflect an element of constipation. No evidence of bowel obstruction or ileus. Vascular/Lymphatic: Extensive atherosclerosis of the aorta is again noted unchanged. Chronic portal vein thrombosis with cavernous transformation again seen. The splenic vein and superior mesenteric vein are patent. Pathologic adenopathy is seen throughout the upper abdomen. 10 mm lymph node in the porta hepatis image 25/2 is stable. Multiple enlarged lymph nodes are seen within the gastrohepatic ligament, largest measuring 11 mm in short axis image 23/2. Necrotic lymph node dorsal to the pancreatic body image 28/2 measures 19 mm in short axis. Aortocaval lymph node image 36/2 measures 10 mm in short axis. Reproductive: Status post hysterectomy. No adnexal masses. Other: Moderate abdominal and pelvic ascites, without appreciable change since prior study. There is some soft tissue nodularity within the right upper quadrant mesentery, just inferior to the liver and along the hepatic flexure of the colon, concerning for peritoneal implant. No free intraperitoneal gas. Stable umbilical  hernia containing mesenteric fat. Inflammatory changes within the hernia may suggest incarceration. Musculoskeletal: No acute or destructive bony lesions. Reconstructed images demonstrate no additional findings. IMPRESSION: 1. Extensive intra-abdominal and intrapelvic metastases, without significant change since prior study. Given the above findings, this may reflect primary pancreatic adenocarcinoma with diffuse metastases. Correlation with recent biopsy results recommended. No significant change in the appearance of the metastatic disease since prior study. 2. Moderate ascites, without significant change since prior study. This is likely malignant ascites given the soft tissue nodularity within the right upper quadrant mesentery. 3. Chronic portal vein thrombosis with cavernous transformation. 4. No evidence of bowel obstruction or ileus. Retained gas and stool throughout the colon may reflect constipation. 5. Stable right pleural effusion. 6.  Aortic Atherosclerosis (ICD10-I70.0). Electronically Signed   By: Randa Ngo M.D.   On: 10/06/2020 19:51   CT ABDOMEN PELVIS W CONTRAST  Addendum Date: 10/03/2020   ADDENDUM REPORT: 10/03/2020 14:10 ADDENDUM: ,There is soft tissue fullness along the anterior right lower rib cartilage, including on 14/2. This is new since 02/19/2020, also highly suspicious for metastatic disease. Electronically Signed   By: Abigail Miyamoto M.D.   On: 10/03/2020 14:10   Result Date: 10/03/2020 CLINICAL DATA:  Right-sided abdominal pain for about a week with nausea and vomiting x2. Constipation. Remote breast cancer history. EXAM: CT ABDOMEN AND PELVIS WITH CONTRAST  TECHNIQUE: Multidetector CT imaging of the abdomen and pelvis was performed using the standard protocol following bolus administration of intravenous contrast. CONTRAST:  157mL OMNIPAQUE IOHEXOL 300 MG/ML  SOLN COMPARISON:  Multiple priors, including MRCP of 09/26/2020, abdominal CT of 02/19/2020, abdominal MRI 03/07/2020.  FINDINGS: Lower chest: Right base atelectasis. Small right pleural effusion is new since the prior CT. Normal heart size with multivessel coronary artery atherosclerosis. Left-sided calcification or surgical clips. Deep to the presumed lumpectomy site is a centrally hypoattenuating lesion of 2.8 x 2.2 cm on 05/02. This is likely not imaged on the prior CT, but present on the 03/07/2020 MRI where it was grossly similar. Hepatobiliary: Multiple hepatic masses are again identified. A high right hepatic lobe lesion measures 2.6 cm on 13/2 versus 1.8 cm on 02/19/2020. A wedge-shaped mass within segments 4 and 2 of the liver measures on the order of 10.5 x 5.1 cm on 19/2. Significantly enlarged from 02/19/2020, where lesions in this region measured up to 3.0 cm. Increased from on the order of 3.3 cm on 03/07/2020. Cholecystectomy without biliary duct dilatation. Pancreas: The pancreas demonstrates similar atrophy with moderate duct dilatation at 9 mm on 29/2. Duct dilatation is followed to the level of the pancreatic head, with there is mild soft tissue fullness but no well-defined mass. Example 34/2. This is unchanged. The pancreatic tail demonstrates developing relative soft tissue fullness compared to the pancreatic body. Example at on the order of 2.3 cm in thickness on 25/2 versus 1.4 cm on 02/19/2020. Spleen: Normal in size, without focal abnormality. Adrenals/Urinary Tract: Normal adrenal glands. Upper pole left renal lesion of 1.6 cm is greater than fluid density but was determined a complex cyst on prior MRI. Normal right kidney. Contrast in the urinary bladder secondary to reported outside CT performed yesterday. Stomach/Bowel: Tiny hiatal hernia. Normal distal stomach. Moderate amount of stool within the rectum. Scattered colonic diverticula. Normal terminal ileum. Normal small bowel caliber. Vascular/Lymphatic: Aortic atherosclerosis. Diffuse portal vein thrombosis again identified. Thrombus involves the  superior aspect of the superior mesenteric vein, chronic. New and progressive abdominal adenopathy. An aortocaval node measures 1.0 cm on 39/2 versus 7 mm on 02/19/2020 (when remeasured). A necrotic node along the celiac, just posterior to the pancreatic body measures 1.9 cm on 30/2 and is new. Developing adenopathy in the gastrohepatic ligament including at 1.0 cm on 24/2. More ill-defined hypoattenuating lesions in the peripancreatic space, including at 2.6 cm in the portacaval space on 29/2, increased from 2.3 cm on 02/19/2020. No pelvic sidewall adenopathy. Reproductive: Hysterectomy.  No adnexal mass. Other: Since 02/19/2020, development of small volume abdominopelvic ascites. Peritoneal nodularity, including within the pelvic cul-de-sac on 78/2 and along the right-side of the abdomen on 39/2. No free intraperitoneal air. A new periumbilical nodule or node measures 1.9 cm on 57/2. Musculoskeletal: No acute osseous abnormality. Trace L4-5 anterolisthesis. Degenerate disc disease at the lumbosacral junction. IMPRESSION: 1. When compared to multiple prior exams, including most direct comparison to the 02/19/2020 CT, new or progressive metastatic disease, most definite involving necrotic abdominal nodes and the peritoneum as detailed above. Developing periumbilical nodularity, also likely metastatic. 2. Progressive liver lesions, also most likely indicative of metastatic disease. In the setting of portal vein thrombosis, progressive abscesses are possible but less likely. 3. Abnormal appearance of the pancreas, at least partially felt to be attributed to chronic pancreatitis. Developing soft tissue fullness within the pancreatic tail, with considerations of primary adenocarcinoma, metastatic disease, or superimposed acute pancreatitis. Peripancreatic hypoattenuating lesions, some  of which are increased compared to 02/19/2020. These could be secondary to prior pancreatitis or also represent necrotic nodal  metastasis. 4. Left breast mass in the setting of prior lumpectomy for carcinoma. Suspicious for new breast primary, incompletely imaged. 5.  Possible constipation. 6. New small volume abdominopelvic ascites. 7. Similar small right pleural effusion compared to the MRI of 09/26/2020. Electronically Signed: By: Abigail Miyamoto M.D. On: 10/03/2020 12:32   MR ABDOMEN MRCP WO CONTRAST  Result Date: 09/27/2020 CLINICAL DATA:  Pancreatitis. Pancreatic and liver lesions on prior MRI. Abdominal pain. EXAM: MRI ABDOMEN WITHOUT CONTRAST  (INCLUDING MRCP) TECHNIQUE: Multiplanar multisequence MR imaging of the abdomen was performed. Heavily T2-weighted images of the biliary and pancreatic ducts were obtained, and three-dimensional MRCP images were rendered by post processing. COMPARISON:  03/07/2020 MRI FINDINGS: Despite efforts by the technologist and patient, severe motion artifact is present on today's exam and could not be eliminated. This reduces exam sensitivity and specificity. Due to pain, the patient terminated the exam prior to obtaining full diagnostic sequences. Lower chest: Small right pleural effusion is new compared to prior MRI. Hepatobiliary: There is been evolution of the previous regions of stippled T2 signal hyperintensity scattered in the liver to current appearance of hazy accentuated T2 signal for example on image 9 of series 4. On biopsy, these were felt to represent areas of periductal fibrosis, chronic lymphoplasmacytic infiltrate, hemosiderin deposition, and regenerative hyperplasia suggesting finding secondary to portal vein thrombosis. This process is significantly expanded throughout segment 4a and 4B of the liver which not demonstrates diffuse hazy accentuated T2 signal as shown on image 14 series 4. There is associated reduced T1 signal in these regions of involvement. No steatosis. There is some associated restriction of diffusion in these regions of involvement. Unfortunately the MRCP images are  severely blurred. No obvious extrahepatic biliary dilatation. There is some mild narrowing of the common hepatic duct along the porta hepatis for example on image 16 series 3, probably due to surrounding soft tissue swelling and possibly periportal fibrosis. As before, there is high T2 signal intensity material throughout the somewhat dilated intrahepatic portal venous system probably from thrombosis. Periportal edema noted. Pancreas: Peripancreatic edema noted with stippled accentuated T2 signal in the pancreas, and substantially dilated segments of the dorsal pancreatic duct especially in the pancreatic body, up to 1.0 cm in diameter. T2 signal hyperintensity partially involving the pancreatic head measuring about 4.5 by 1.6 by 2.6 cm, extending cephalad along the porta hepatis, I am uncertain whether this represents a thrombosed varix from the portal vein, pseudocyst, or a cystic pancreatic neoplasm. This appears roughly similar to the prior exam. Spleen:  Unremarkable Adrenals/Urinary Tract: Small renal lesions of varying complexity are present and probably a combination of simple and complex cysts, although enhancement characteristics are not interrogated today. The adrenal glands appear normal. Stomach/Bowel: Unremarkable Vascular/Lymphatic: Aortoiliac atherosclerotic vascular disease. 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 Collaterals along the porta hepatis. Other: Ascites particularly along the paracolic gutters and in the pelvis (pelvic ascites seen on coronal images). Mesenteric edema mildly improved in the central mesentery compared to previous. Omental edema noted. There is hazy stranding in the root of the mesentery as on prior exams. Musculoskeletal: Lower lumbar spondylosis and degenerative disc disease. IMPRESSION: 1. Despite efforts by the technologist and patient, severe motion artifact  is present on today's exam and could not be eliminated. The patient was  also unable to complete the full sequence of the exam. This reduces exam sensitivity and specificity. 2. Evolution of the appearance in the liver with the previously stippled T2 signal hyperintensities now appearing as hazy increased T2 signal, and with substantially increased involvement throughout segment 4 and 4B of the liver. Based on prior biopsy results this likely represents combination of fibrosis, lymphoplasmacytic infiltrate, hemosiderin deposition, and periportal hyperplasia resulting from portal vein thrombosis. 3. 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 No obvious extrahepatic biliary dilatation. There is some mild narrowing of the common hepatic duct along the porta hepatis, probably due to surrounding soft tissue swelling and possibly periportal fibrosis. 4. Peripancreatic edema with stippled accentuated T2 signal in the pancreas, and substantially dilated segments of the dorsal pancreatic duct especially in the pancreatic body. 5. Lobulated T2 signal hyperintensity along the pancreatic head, possibly a pseudocyst or a thrombosed varix of the portal vein, less likely cystic pancreatic neoplasm. 6. Small right pleural effusion is new compared to previous MRI. 7. Reduced central mesenteric edema compared to previous, although there is still some peripancreatic edema as well as mesenteric edema and ascites. 8. Lower lumbar spondylosis and degenerative disc disease. Electronically Signed   By: Van Clines M.D.   On: 09/27/2020 16:11   US Paracentesis  Result Date: 10/09/2020 INDICATION: 75 year old female with history of breast cancer and abdominal pain found to have large volume ascites. EXAM: ULTRASOUND GUIDED left lower quadrant PARACENTESIS MEDICATIONS: None. COMPLICATIONS: None immediate. PROCEDURE:  Informed written consent was obtained from the patient after a discussion of the risks, benefits and alternatives to treatment. A timeout was performed prior to the initiation of the procedure. Initial ultrasound scanning demonstrates a large amount of ascites within the left lower abdominal quadrant. The right lower abdomen was prepped and draped in the usual sterile fashion. 1% lidocaine was used for local anesthesia. Following this, a 6 Fr Safe-T-Centesis catheter was introduced. An ultrasound image was saved for documentation purposes. The paracentesis was performed. The catheter was removed and a dressing was applied. The patient tolerated the procedure well without immediate post procedural complication. FINDINGS: A total of approximately 3.05 L of translucent, straw-colored fluid was removed. Samples were sent to the laboratory as requested by the clinical team. IMPRESSION: Successful ultrasound-guided paracentesis yielding 3.05 liters of peritoneal fluid. Ruthann Cancer, MD [facility] Electronically Signed   By: Ruthann Cancer MD   On: 10/09/2020 15:53   DG Chest Portable 1 View  Result Date: 10/09/2020 CLINICAL DATA:  Altered mental status.  Rule out pneumonia EXAM: PORTABLE CHEST 1 VIEW COMPARISON:  10/04/2020 FINDINGS: Progression of right lower lobe airspace disease.  No effusion Left lung remains clear.  Negative for heart failure. IMPRESSION: Progression of right lower lobe airspace disease which may represent atelectasis or pneumonia. Electronically Signed   By: Franchot Gallo M.D.   On: 10/09/2020 10:11   ECHOCARDIOGRAM COMPLETE  Result Date: 10/16/2020    ECHOCARDIOGRAM REPORT   Patient Name:   Theresa Andrews Date of Exam: 10/16/2020 Medical Rec #:  [id number]      Height:       64.0 in Accession #:    [id number]     Weight:       157.6 lb Date of Birth:  1945-12-01       BSA:  1.768 m Patient Age:    87 years       BP:            136/69 mmHg Patient Gender: F              HR:           88 bpm. Exam Location:  [facility] Procedure: 2D Echo, Color Doppler, Cardiac Doppler and Intracardiac            Opacification Agent Indications:     I21.4 NSTEMI; R07.9 Chest Pain  History:         Patient has no prior history of Echocardiogram examinations.                  Previous Myocardial Infarction and CAD; Risk                  Factors:Hypertension and Dyslipidemia. Hx of chemotherapy.  Sonographer:     Charmayne Sheer RDCS (AE) Referring Phys:  [id number] Collier Bullock Diagnosing Phys: Bartholome Bill MD  Sonographer Comments: Suboptimal apical window and suboptimal subcostal window. IMPRESSIONS  1. Left ventricular ejection fraction, by estimation, is 65 to 70%. The left ventricle has normal function. The left ventricle has no regional wall motion abnormalities. Left ventricular diastolic parameters are consistent with Grade I diastolic dysfunction (impaired relaxation).  2. Right ventricular systolic function is normal. The right ventricular size is normal.  3. The mitral valve is grossly normal. Mild mitral valve regurgitation.  4. The aortic valve is tricuspid. Aortic valve regurgitation is trivial. FINDINGS  Left Ventricle: Left ventricular ejection fraction, by estimation, is 65 to 70%. The left ventricle has normal function. The left ventricle has no regional wall motion abnormalities. Definity contrast agent was given IV to delineate the left ventricular  endocardial borders. The left ventricular internal cavity size was normal in size. There is no left ventricular hypertrophy. Left ventricular diastolic parameters are consistent with Grade I diastolic dysfunction (impaired relaxation). Right Ventricle: The right ventricular size is normal. No increase in right ventricular wall thickness. Right ventricular systolic function is normal. Left Atrium: Left atrial size was normal in size. Right Atrium: Right atrial size was normal in size. Pericardium: There is  no evidence of pericardial effusion. Mitral Valve: The mitral valve is grossly normal. Mild mitral valve regurgitation. MV peak gradient, 3.6 mmHg. The mean mitral valve gradient is 1.0 mmHg. Tricuspid Valve: The tricuspid valve is grossly normal. Tricuspid valve regurgitation is mild. Aortic Valve: The aortic valve is tricuspid. Aortic valve regurgitation is trivial. Aortic valve mean gradient measures 4.0 mmHg. Aortic valve peak gradient measures 7.4 mmHg. Aortic valve area, by VTI measures 1.76 cm. Pulmonic Valve: The pulmonic valve was not well visualized. Pulmonic valve regurgitation is trivial. Aorta: The aortic root is normal in size and structure. IAS/Shunts: The interatrial septum was not assessed.  LEFT VENTRICLE PLAX 2D LVIDd:         3.40 cm  Diastology LVIDs:         2.10 cm  LV e' medial:    7.07 cm/s LV PW:         0.90 cm  LV E/e' medial:  8.0 LV IVS:        0.70 cm  LV e' lateral:   8.70 cm/s LVOT diam:     1.60 cm  LV E/e' lateral: 6.5 LV SV:         38 LV SV Index:   22 LVOT Area:  2.01 cm  RIGHT VENTRICLE RV Basal diam:  2.60 cm LEFT ATRIUM             Index LA diam:        3.00 cm 1.70 cm/m LA Vol (A2C):   29.6 ml 16.74 ml/m LA Vol (A4C):   27.0 ml 15.27 ml/m LA Biplane Vol: 28.2 ml 15.95 ml/m  AORTIC VALVE                   PULMONIC VALVE AV Area (Vmax):    1.55 cm    PV Vmax:       1.22 m/s AV Area (Vmean):   1.57 cm    PV Vmean:      86.600 cm/s AV Area (VTI):     1.76 cm    PV VTI:        0.203 m AV Vmax:           136.00 cm/s PV Peak grad:  6.0 mmHg AV Vmean:          95.500 cm/s PV Mean grad:  3.0 mmHg AV VTI:            0.217 m AV Peak Grad:      7.4 mmHg AV Mean Grad:      4.0 mmHg LVOT Vmax:         105.00 cm/s LVOT Vmean:        74.400 cm/s LVOT VTI:          0.190 m LVOT/AV VTI ratio: 0.88  AORTA Ao Root diam: 2.80 cm MITRAL VALVE MV Area (PHT): 2.77 cm    SHUNTS MV Area VTI:   2.03 cm    Systemic VTI:  0.19 m MV Peak grad:  3.6 mmHg    Systemic Diam: 1.60 cm MV Mean  grad:  1.0 mmHg MV Vmax:       0.95 m/s MV Vmean:      49.6 cm/s MV Decel Time: 274 msec MV E velocity: 56.60 cm/s MV A velocity: 81.40 cm/s MV E/A ratio:  0.70 Bartholome Bill MD Electronically signed by Bartholome Bill MD Signature Date/Time: 10/16/2020/10:09:22 AM    Final    [place] CORE BIOPSY (SOFT TISSUE)  Result Date: 10/04/2020 INDICATION: 75 year old woman with history of breast cancer presented to interventional radiology for biopsy of new right chest wall mass. EXAM: Ultrasound-guided biopsy of right chest wall mass MEDICATIONS: None. ANESTHESIA/SEDATION: Moderate (conscious) sedation was employed during this procedure. A total of Versed 2 mg and Fentanyl 100 mcg was administered intravenously. Moderate Sedation Time: 8 minutes. The patient's level of consciousness and vital signs were monitored continuously by radiology nursing throughout the procedure under my direct supervision. COMPLICATIONS: None immediate. PROCEDURE: Informed written consent was obtained from the patient after a thorough discussion of the procedural risks, benefits and alternatives. All questions were addressed. Maximal Sterile Barrier Technique was utilized including caps, mask, sterile gowns, sterile gloves, sterile drape, hand hygiene and skin antiseptic. A timeout was performed prior to the initiation of the procedure. Patient position supine on the ultrasound table. Right anterior lower chest wall skin prepped and draped in usual sterile fashion. Following local lidocaine administration, 18 gauge biopsy needle was advanced into the right anterior chest wall mass, and four cores were obtained utilizing continuous ultrasound guidance. Samples were sent to pathology in formalin. Needle removed and hemostasis achieved with 5 minutes of manual compression. Post procedure ultrasound images showed no evidence of significant hemorrhage. IMPRESSION: Ultrasound-guided biopsy of right  anterior chest wall mass. Electronically Signed   By: Miachel Roux M.D.   On: 10/04/2020 17:04   [place] EKG SITE RITE  Result Date: 10/15/2020 If [facility] image not attached, placement could not be confirmed due to current cardiac rhythm.   Assessment & Plan    75 year old female with history of metastatic breast carcinoma on chemotherapy currently receiving chemotherapy.  She presented after developing weakness and chest heaviness.  She was somewhat hypotensive in the oncologist office.  EKG on presentation is unremarkable for ischemia.  She does have elevated serum troponins however they are flat.  Symptoms and EKG as well as troponin not consistent with acute coronary syndrome.  We will continue to medical management.  1.  Would continue with metoprolol and rosuvastatin as well as aspirin.  Not a candidate for invasive evaluation at present given symptoms.  Not a candidate for heparin.    Echo revealed normal LV function with no regional wall motion abnormalities.  No  pericardial effusion noted.  No significant valvular abnormalities noted.  No regional wall motion abnormalities.  Appears stable from a cardiac standpoint.  Troponin elevation does not appear to suggest acute coronary syndrome.  Would be okay for discharge from a cardiac standpoint.  2 breast carcinoma-being treated by oncology.  Signed, Javier Docker Esme Durkin MD 10/16/2020, 12:06 PM  Pager: [phone]

## 2020-10-16 NOTE — Progress Notes (Signed)
PROGRESS NOTE    FOSTER SONNIER  VFI:[id number] DOB: [birth year] DOA: 10/15/2020 PCP: Steele Sizer, MD    Brief Narrative:  AYARI LIWANAG is a 75 y.o. female with medical history significant for left-sided breast cancer status post lumpectomy, radiation and chemotherapy, history of hypertension, anxiety disorder, dyslipidemia, history of portal vein thrombosis and recently diagnosed metastatic carcinoma with primary either from the breast vs or pancreaticobiliary.  Patient received her first chemotherapy on 10/10/20 and received treatment with carboplatin and paclitaxel. Patient was seen by her oncologist 1 day prior to admission for evaluation of weakness, poor oral intake and fatigue.  Her blood pressure was in the 80s during the office visit and she received IV fluids and instructions to hold her antihypertensive medications.  Blood pressure improved with IV hydration and she was discharged home. Patient came back to the hospital complaining of chest pain.  With mild elevation troponin.    Assessment & Plan:   Principal Problem:   NSTEMI (non-ST elevated myocardial infarction) ([place]) Active Problems:   Atherosclerosis of coronary artery   Benign essential HTN   Portal vein thrombosis   Carcinoma of unknown primary ([place])   Depression  #1.  Non-STEMI. Most likely secondary to hypotension and dehydration. Patient has been seen by cardiology, no further work-up is indicated.  2.  Metastatic adenocarcinoma. Thrombocytopenia. Patient received chemotherapy.  Patient had a significant drop of platelets today, oncology consult is pending.  #3.  Essential hypertension. Recent hypotension Continue metoprolol. Hold off amlodipine.  #4.  History of portal vein thrombosis. Discontinued heparin due to significant thrombocytopenia  #5.  Anemia. Check iron B12 level.    DVT prophylaxis: None Code Status: Full Family Communication: Daughter at bedside. Disposition Plan:   .   Status is: Observation    Dispo: The patient is from: Home              Anticipated d/c is to: Home              Patient currently is not medically stable to d/c.   Difficult to place patient No        No intake/output data recorded. No intake/output data recorded.     Consultants:   Cardiology and oncology.  Procedures: None  Antimicrobials:None  Subjective: Patient has a poor appetite, otherwise doing well.  No nausea vomiting abdominal pain. No chest pain or shortness of breath. No dysuria hematuria.   Objective: Vitals:   10/16/20 0324 10/16/20 0740 10/16/20 1025 10/16/20 1120  BP: 118/68 136/69  125/67  Pulse: 88 86  81  Resp: 17 16    Temp: 97.9 F (36.6 C) 98.6 F (37 C)  98.4 F (36.9 C)  TempSrc: Oral Oral  Oral  SpO2: 98% 99%  99%  Weight: 71.5 kg  69.2 kg   Height:        Intake/Output Summary (Last 24 hours) at 10/16/2020 1623 Last data filed at 10/15/2020 2245 Gross per 24 hour  Intake 0 ml  Output 0 ml  Net 0 ml   Filed Weights   10/15/20 2245 10/16/20 0324 10/16/20 1025  Weight: 72.7 kg 71.5 kg 69.2 kg    Examination:  General exam: Appears calm and comfortable  Respiratory system: Clear to auscultation. Respiratory effort normal. Cardiovascular system: S1 & S2 heard, RRR. No JVD, murmurs, rubs, gallops or clicks. No pedal edema. Gastrointestinal system: Abdomen is nondistended, soft and nontender. No organomegaly or masses felt. Normal bowel sounds heard. Central nervous system: Alert  and oriented x3. No focal neurological deficits. Extremities: Symmetric 5 x 5 power. Skin: No rashes, lesions or ulcers Psychiatry:  Mood & affect appropriate.     Data Reviewed: I have personally reviewed following labs and imaging studies  CBC: Recent Labs  Lab 10/11/20 0435 10/12/20 0446 10/14/20 1448 10/15/20 1130 10/16/20 0602  WBC 9.5 17.7* 14.2* 11.4* 8.2  NEUTROABS  --   --  13.1*  --   --   HGB 9.2* 9.6* 10.4* 9.9* 9.5*   HCT 29.4* 30.2* 33.6* 31.0* 30.7*  MCV 83.8 83.2 84.4 81.6 84.3  PLT 148* 128* PLATELET CLUMPS NOTED ON SMEAR, UNABLE TO ESTIMATE PLATELET CLUMPS NOTED ON SMEAR, UNABLE TO ESTIMATE 38*   Basic Metabolic Panel: Recent Labs  Lab 10/10/20 0523 10/11/20 0435 10/12/20 0446 10/14/20 1448 10/15/20 1130  NA 139 138 137 138 136  K 4.0 4.6 4.2 4.7 4.8  CL 110 109 110 107 108  CO2 20* 21* 23 20* 19*  GLUCOSE 121* 121* 109* 123* 123*  BUN 25* 28* 32* 40* 38*  CREATININE 0.62 0.67 0.64 0.96 0.83  CALCIUM 9.1 8.7* 8.6* 9.1 8.9  MG 2.2 2.3 2.2  --   --   PHOS 3.0 4.0 2.5  --   --    GFR: Estimated Creatinine Clearance: 56.8 mL/min (by C-G formula based on SCr of 0.83 mg/dL). Liver Function Tests: Recent Labs  Lab 10/14/20 1448  AST 30  ALT 20  ALKPHOS 92  BILITOT 0.8  PROT 6.1*  ALBUMIN 2.7*   Recent Labs  Lab 10/15/20 1130  LIPASE 39   No results for input(s): AMMONIA in the last 168 hours. Coagulation Profile: No results for input(s): INR, PROTIME in the last 168 hours. Cardiac Enzymes: No results for input(s): CKTOTAL, CKMB, CKMBINDEX, TROPONINI in the last 168 hours. BNP (last 3 results) No results for input(s): PROBNP in the last 8760 hours. HbA1C: No results for input(s): HGBA1C in the last 72 hours. CBG: No results for input(s): GLUCAP in the last 168 hours. Lipid Profile: No results for input(s): CHOL, HDL, LDLCALC, TRIG, CHOLHDL, LDLDIRECT in the last 72 hours. Thyroid Function Tests: No results for input(s): TSH, T4TOTAL, FREET4, T3FREE, THYROIDAB in the last 72 hours. Anemia Panel: No results for input(s): VITAMINB12, FOLATE, FERRITIN, TIBC, IRON, RETICCTPCT in the last 72 hours. Sepsis Labs: No results for input(s): PROCALCITON, LATICACIDVEN in the last 168 hours.  Recent Results (from the past 240 hour(s))  Urine culture     Status: Abnormal   Collection Time: 10/06/20  8:50 PM   Specimen: Urine, Random  Result Value Ref Range Status   Specimen  Description   Final    URINE, RANDOM Performed at [facility], [address], [place], [place] [address]    Special Requests   Final    NONE Performed at [facility], [address], [place], [place] [address]    Culture (A)  Final    <10,000 COLONIES/mL INSIGNIFICANT GROWTH Performed at [facility] [address] [place], [place] [address]    Report Status 10/08/2020 FINAL  Final  Resp Panel by RT-PCR (Flu A&B, Covid) Nasopharyngeal Swab     Status: None   Collection Time: 10/09/20 11:55 AM   Specimen: Nasopharyngeal Swab; Nasopharyngeal(NP) swabs in vial transport medium  Result Value Ref Range Status   SARS Coronavirus 2 by RT PCR NEGATIVE NEGATIVE Final    Comment: (NOTE) SARS-CoV-2 target nucleic acids are NOT DETECTED.  The SARS-CoV-2  RNA is generally detectable in upper respiratory specimens during the acute phase of infection. The lowest concentration of SARS-CoV-2 viral copies this assay can detect is 138 copies/mL. A negative result does not preclude SARS-Cov-2 infection and should not be used as the sole basis for treatment or other patient management decisions. A negative result may occur with  improper specimen collection/handling, submission of specimen other than nasopharyngeal swab, presence of viral mutation(s) within the areas targeted by this assay, and inadequate number of viral copies(<138 copies/mL). A negative result must be combined with clinical observations, patient history, and epidemiological information. The expected result is Negative.  Fact Sheet for Patients:  EntrepreneurPulse.com.au  Fact Sheet for Healthcare Providers:  IncredibleEmployment.be  This test is no t yet approved or cleared by the [place] FDA and  has been authorized for detection and/or diagnosis of SARS-CoV-2 by FDA under an Emergency Use Authorization (EUA). This EUA will remain  in effect (meaning this  test can be used) for the duration of the COVID-19 declaration under Section 564(b)(1) of the Act, 21 U.S.C.section 360bbb-3(b)(1), unless the authorization is terminated  or revoked sooner.       Influenza A by PCR NEGATIVE NEGATIVE Final   Influenza B by PCR NEGATIVE NEGATIVE Final    Comment: (NOTE) The Xpert Xpress SARS-CoV-2/FLU/RSV plus assay is intended as an aid in the diagnosis of influenza from Nasopharyngeal swab specimens and should not be used as a sole basis for treatment. Nasal washings and aspirates are unacceptable for Xpert Xpress SARS-CoV-2/FLU/RSV testing.  Fact Sheet for Patients: EntrepreneurPulse.com.au  Fact Sheet for Healthcare Providers: IncredibleEmployment.be  This test is not yet approved or cleared by the [place] FDA and has been authorized for detection and/or diagnosis of SARS-CoV-2 by FDA under an Emergency Use Authorization (EUA). This EUA will remain in effect (meaning this test can be used) for the duration of the COVID-19 declaration under Section 564(b)(1) of the Act, 21 U.S.C. section 360bbb-3(b)(1), unless the authorization is terminated or revoked.  Performed at [facility], [address] [place], [place] [address]   Blood culture (routine x 2)     Status: None   Collection Time: 10/09/20 11:55 AM   Specimen: BLOOD  Result Value Ref Range Status   Specimen Description BLOOD RIGHT ANTECUBITAL  Final   Special Requests   Final    BOTTLES DRAWN AEROBIC AND ANAEROBIC Blood Culture adequate volume   Culture   Final    NO GROWTH 5 DAYS Performed at [facility], [address], [place], [place] [address]    Report Status 10/14/2020 FINAL  Final  Body fluid culture w Gram Stain     Status: None   Collection Time: 10/09/20  2:45 PM   Specimen: PATH Cytology Peritoneal fluid  Result Value Ref Range Status   Specimen Description   Final    PERITONEAL Performed at [facility], [address] [place], [place] [address]    Special Requests   Final    NONE Performed at [facility], [address] [place], [place] [address]    Gram Stain   Final    RARE WBC PRESENT, PREDOMINANTLY PMN NO ORGANISMS SEEN    Culture   Final    NO GROWTH Performed at [facility], [place] [address] [place],  [address]    Report Status 10/13/2020 FINAL  Final  Blood culture (routine x 2)     Status: None   Collection Time:  10/09/20  6:45 PM   Specimen: BLOOD  Result Value Ref Range Status   Specimen Description BLOOD LEFT ANTECUBITAL  Final   Special Requests   Final    BOTTLES DRAWN AEROBIC AND ANAEROBIC Blood Culture adequate volume   Culture   Final    NO GROWTH 5 DAYS Performed at [facility], [address] [place], [place] [address]    Report Status 10/14/2020 FINAL  Final  Resp Panel by RT-PCR (Flu A&B, Covid) Nasopharyngeal Swab     Status: None   Collection Time: 10/15/20 12:50 PM   Specimen: Nasopharyngeal Swab; Nasopharyngeal(NP) swabs in vial transport medium  Result Value Ref Range Status   SARS Coronavirus 2 by RT PCR NEGATIVE NEGATIVE Final    Comment: (NOTE) SARS-CoV-2 target nucleic acids are NOT DETECTED.  The SARS-CoV-2 RNA is generally detectable in upper respiratory specimens during the acute phase of infection. The lowest concentration of SARS-CoV-2 viral copies this assay can detect is 138 copies/mL. A negative result does not preclude SARS-Cov-2 infection and should not be used as the sole basis for treatment or other patient management decisions. A negative result may occur with  improper specimen collection/handling, submission of specimen other than nasopharyngeal swab, presence of viral mutation(s) within the areas targeted by this assay, and inadequate number of viral copies(<138 copies/mL). A negative result must be combined with clinical observations, patient history, and  epidemiological information. The expected result is Negative.  Fact Sheet for Patients:  EntrepreneurPulse.com.au  Fact Sheet for Healthcare Providers:  IncredibleEmployment.be  This test is no t yet approved or cleared by the [place] FDA and  has been authorized for detection and/or diagnosis of SARS-CoV-2 by FDA under an Emergency Use Authorization (EUA). This EUA will remain  in effect (meaning this test can be used) for the duration of the COVID-19 declaration under Section 564(b)(1) of the Act, 21 U.S.C.section 360bbb-3(b)(1), unless the authorization is terminated  or revoked sooner.       Influenza A by PCR NEGATIVE NEGATIVE Final   Influenza B by PCR NEGATIVE NEGATIVE Final    Comment: (NOTE) The Xpert Xpress SARS-CoV-2/FLU/RSV plus assay is intended as an aid in the diagnosis of influenza from Nasopharyngeal swab specimens and should not be used as a sole basis for treatment. Nasal washings and aspirates are unacceptable for Xpert Xpress SARS-CoV-2/FLU/RSV testing.  Fact Sheet for Patients: EntrepreneurPulse.com.au  Fact Sheet for Healthcare Providers: IncredibleEmployment.be  This test is not yet approved or cleared by the [place] FDA and has been authorized for detection and/or diagnosis of SARS-CoV-2 by FDA under an Emergency Use Authorization (EUA). This EUA will remain in effect (meaning this test can be used) for the duration of the COVID-19 declaration under Section 564(b)(1) of the Act, 21 U.S.C. section 360bbb-3(b)(1), unless the authorization is terminated or revoked.  Performed at [facility], [address] [place], [place] [address]          Radiology Studies: DG Chest 2 View  Result Date: 10/15/2020 CLINICAL DATA:  Chest pain for 30 minutes, upper chest pain. EXAM: CHEST - 2 VIEW COMPARISON:  October 09, 2020 FINDINGS: Trachea midline. Low lung  volumes. Cardiomediastinal contours are stable and accentuated by low depth of expansion. Calcified atheromatous plaque in the thoracic aorta. Basilar opacities. Increased basilar opacification on the RIGHT as compared to the previous study. Persistent linear opacity in the RIGHT mid chest. Effusion seen on lateral radiograph with blunting of RIGHT costodiaphragmatic sulcus. Likely increased slightly  since the prior study. On limited assessment no acute skeletal process. IMPRESSION: Small RIGHT effusion slightly increased with increased basilar opacity at the RIGHT lung base may reflect developing infection or worsening volume loss in the setting of diminished lung volumes. Electronically Signed   By: Zetta Bills M.D.   On: 10/15/2020 12:44   ECHOCARDIOGRAM COMPLETE  Result Date: 10/16/2020    ECHOCARDIOGRAM REPORT   Patient Name:   CHESTINE BELKNAP Date of Exam: 10/16/2020 Medical Rec #:  [id number]      Height:       64.0 in Accession #:    [id number]     Weight:       157.6 lb Date of Birth:  1945-11-09       BSA:          1.768 m Patient Age:    71 years       BP:           136/69 mmHg Patient Gender: F              HR:           88 bpm. Exam Location:  [facility] Procedure: 2D Echo, Color Doppler, Cardiac Doppler and Intracardiac            Opacification Agent Indications:     I21.4 NSTEMI; R07.9 Chest Pain  History:         Patient has no prior history of Echocardiogram examinations.                  Previous Myocardial Infarction and CAD; Risk                  Factors:Hypertension and Dyslipidemia. Hx of chemotherapy.  Sonographer:     Charmayne Sheer RDCS (AE) Referring Phys:  [id number] Collier Bullock Diagnosing Phys: Bartholome Bill MD  Sonographer Comments: Suboptimal apical window and suboptimal subcostal window. IMPRESSIONS  1. Left ventricular ejection fraction, by estimation, is 65 to 70%. The left ventricle has normal function. The left ventricle has no regional wall motion abnormalities. Left ventricular diastolic  parameters are consistent with Grade I diastolic dysfunction (impaired relaxation).  2. Right ventricular systolic function is normal. The right ventricular size is normal.  3. The mitral valve is grossly normal. Mild mitral valve regurgitation.  4. The aortic valve is tricuspid. Aortic valve regurgitation is trivial. FINDINGS  Left Ventricle: Left ventricular ejection fraction, by estimation, is 65 to 70%. The left ventricle has normal function. The left ventricle has no regional wall motion abnormalities. Definity contrast agent was given IV to delineate the left ventricular  endocardial borders. The left ventricular internal cavity size was normal in size. There is no left ventricular hypertrophy. Left ventricular diastolic parameters are consistent with Grade I diastolic dysfunction (impaired relaxation). Right Ventricle: The right ventricular size is normal. No increase in right ventricular wall thickness. Right ventricular systolic function is normal. Left Atrium: Left atrial size was normal in size. Right Atrium: Right atrial size was normal in size. Pericardium: There is no evidence of pericardial effusion. Mitral Valve: The mitral valve is grossly normal. Mild mitral valve regurgitation. MV peak gradient, 3.6 mmHg. The mean mitral valve gradient is 1.0 mmHg. Tricuspid Valve: The tricuspid valve is grossly normal. Tricuspid valve regurgitation is mild. Aortic Valve: The aortic valve is tricuspid. Aortic valve regurgitation is trivial. Aortic valve mean gradient measures 4.0 mmHg. Aortic valve peak gradient measures 7.4 mmHg. Aortic valve area, by VTI measures 1.76 cm. Pulmonic  Valve: The pulmonic valve was not well visualized. Pulmonic valve regurgitation is trivial. Aorta: The aortic root is normal in size and structure. IAS/Shunts: The interatrial septum was not assessed.  LEFT VENTRICLE PLAX 2D LVIDd:         3.40 cm  Diastology LVIDs:         2.10 cm  LV e' medial:    7.07 cm/s LV PW:         0.90 cm   LV E/e' medial:  8.0 LV IVS:        0.70 cm  LV e' lateral:   8.70 cm/s LVOT diam:     1.60 cm  LV E/e' lateral: 6.5 LV SV:         38 LV SV Index:   22 LVOT Area:     2.01 cm  RIGHT VENTRICLE RV Basal diam:  2.60 cm LEFT ATRIUM             Index LA diam:        3.00 cm 1.70 cm/m LA Vol (A2C):   29.6 ml 16.74 ml/m LA Vol (A4C):   27.0 ml 15.27 ml/m LA Biplane Vol: 28.2 ml 15.95 ml/m  AORTIC VALVE                   PULMONIC VALVE AV Area (Vmax):    1.55 cm    PV Vmax:       1.22 m/s AV Area (Vmean):   1.57 cm    PV Vmean:      86.600 cm/s AV Area (VTI):     1.76 cm    PV VTI:        0.203 m AV Vmax:           136.00 cm/s PV Peak grad:  6.0 mmHg AV Vmean:          95.500 cm/s PV Mean grad:  3.0 mmHg AV VTI:            0.217 m AV Peak Grad:      7.4 mmHg AV Mean Grad:      4.0 mmHg LVOT Vmax:         105.00 cm/s LVOT Vmean:        74.400 cm/s LVOT VTI:          0.190 m LVOT/AV VTI ratio: 0.88  AORTA Ao Root diam: 2.80 cm MITRAL VALVE MV Area (PHT): 2.77 cm    SHUNTS MV Area VTI:   2.03 cm    Systemic VTI:  0.19 m MV Peak grad:  3.6 mmHg    Systemic Diam: 1.60 cm MV Mean grad:  1.0 mmHg MV Vmax:       0.95 m/s MV Vmean:      49.6 cm/s MV Decel Time: 274 msec MV E velocity: 56.60 cm/s MV A velocity: 81.40 cm/s MV E/A ratio:  0.70 Bartholome Bill MD Electronically signed by Bartholome Bill MD Signature Date/Time: 10/16/2020/10:09:22 AM    Final    [place] EKG SITE RITE  Result Date: 10/15/2020 If Site Rite image not attached, placement could not be confirmed due to current cardiac rhythm.       Scheduled Meds: . aspirin EC  81 mg Oral Daily  . cholecalciferol  1,000 Units Oral Daily  . feeding supplement  237 mL Oral BID BM  . metoprolol succinate  12.5 mg Oral Daily  . PARoxetine  10 mg Oral Daily  . polyethylene glycol  17 g Oral  Daily  . rosuvastatin  10 mg Oral Daily   Continuous Infusions:   LOS: 0 days    Time spent: 28 minutes    Sharen Hones, MD [facility]   To contact the  attending provider between 7A-7P or the covering provider during after hours 7P-7A, please log into the web site [email or web address] and access using universal [place] password for that web site. If you do not have the password, please call the hospital operator.  10/16/2020, 4:23 PM

## 2020-10-16 NOTE — TOC Initial Note (Signed)
Transition of Care [facility]) - Initial/Assessment Note    Patient Details  Name: Theresa Andrews MRN: [id number] Date of Birth: [birth year]  Transition of Care [facility]) CM/SW Contact:    Kerin Salen, RN Phone Number: 10/16/2020, 2:07 PM  Clinical Narrative: Spoke with patient with daughter at bedside. Daughter states she lives next door, with other siblings they rotate hours caring for patients. Daughter states patient is never at home alone for maybe 64min in 24hr day. Patient able to do ADL's however with assistance from daughter that lives with her. Use [facility] pharmacy, [address] Daughters do shopping, cooking and transport to medical visits. TOC will continue to track for discharge needs.                  Expected Discharge Plan: Home/Self Care Barriers to Discharge: Continued Medical Work up   Patient Goals and CMS Choice Patient states their goals for this hospitalization and ongoing recovery are:: To return home per daughter.   Choice offered to / list presented to : NA  Expected Discharge Plan and Services Expected Discharge Plan: Home/Self Care   Discharge Planning Services: NA Post Acute Care Choice: NA Living arrangements for the past 2 months: Single Family Home                 DME Arranged: N/A DME Agency: NA       HH Arranged: NA HH Agency: NA        Prior Living Arrangements/Services Living arrangements for the past 2 months: Single Family Home Lives with:: Adult Children Patient language and need for interpreter reviewed:: Yes Do you feel safe going back to the place where you live?: Yes      Need for Family Participation in Patient Care: Yes (Comment) Care giver support system in place?: Yes (comment)   Criminal Activity/Legal Involvement Pertinent to Current Situation/Hospitalization: No - Comment as needed  Activities of Daily Living Home Assistive Devices/Equipment: None ADL Screening (condition at time of admission) Patient's cognitive ability  adequate to safely complete daily activities?: Yes Is the patient deaf or have difficulty hearing?: No Does the patient have difficulty seeing, even when wearing glasses/contacts?: No Does the patient have difficulty concentrating, remembering, or making decisions?: Yes Patient able to express need for assistance with ADLs?: Yes Does the patient have difficulty dressing or bathing?: Yes Independently performs ADLs?: Yes (appropriate for developmental age) Does the patient have difficulty walking or climbing stairs?: Yes Weakness of Legs: Both Weakness of Arms/Hands: None  Permission Sought/Granted                  Emotional Assessment Appearance:: Appears younger than stated age Attitude/Demeanor/Rapport: Engaged Affect (typically observed): Flat Orientation: : Oriented to Self,Oriented to Place,Oriented to  Time,Oriented to Situation Alcohol / Substance Use: Not Applicable Psych Involvement: No (comment)  Admission diagnosis:  Unstable angina pectoris (HCC) [I20.0] NSTEMI (non-ST elevated myocardial infarction) [facility]) [I21.4] Patient Active Problem List   Diagnosis Date Noted  . NSTEMI (non-ST elevated myocardial infarction) ([place]) 10/15/2020  . Depression   . Acute metabolic encephalopathy 38/75/6433  . Palliative care encounter   . Malignant ascites 10/09/2020  . Carcinoma of unknown primary ([place]) 10/09/2020  . Portal vein thrombosis   . Abdominal pain 10/03/2020  . Lesion of liver 03/11/2020  . Bradycardia 10/04/2018  . Obesity (BMI 30-39.9) 05/12/2018  . Statin myopathy 05/12/2018  . Vitamin D deficiency 01/09/2018  . Hyperparathyroidism ([place]) 01/07/2018  . Carcinoma of overlapping sites of left  breast in female, estrogen receptor positive ([place]) 03/27/2016  . Hypercalcemia 08/20/2015  . Diuretic-induced hypokalemia 04/29/2015  . Abnormal finding on thyroid function test 10/29/2014  . Anxiety disorder 10/29/2014  . Breast CA ([place]) 10/29/2014  . Atherosclerosis of  coronary artery 10/29/2014  . Prediabetes 10/29/2014  . Gravida 1 10/29/2014  . Hyperlipidemia 10/29/2014  . Disorder of peripheral nervous system 10/29/2014  . At risk for falling 10/29/2014  . Neoplasm of skin 10/29/2014  . Benign essential HTN 04/25/2014   PCP:  Steele Sizer, MD Pharmacy:   [facility] [place] [address] [place])  [id number] Phone: [phone] Fax: [phone]     Social Determinants of Health (SDOH) Interventions    Readmission Risk Interventions Readmission Risk Prevention Plan 10/12/2020  Transportation Screening Complete  PCP or Specialist Appt within 3-5 Days Complete  HRI or [place] (No Data)  Social Work Consult for [place] Planning/Counseling [place] Not Applicable  Medication Review [employer]) Complete  Some recent data might be hidden

## 2020-10-16 NOTE — Progress Notes (Signed)
Mobility Specialist - Progress Note   10/16/20 1300  Mobility  Activity Transferred to/from [facility]  Level of Assistance Moderate assist, patient does 50-74%  Assistive Device Front wheel walker  Distance Ambulated (ft) 2 ft  Mobility Response Tolerated poorly  Mobility performed by Mobility specialist  $Mobility charge 1 Mobility    Mobility responded to bed alarm as pt is attempting OOB transfer to [facility]. Pt Aox3, but very confused. Pt struggles to carry over single-step commands, requiring max verbal/tactile cueing throughout session. Pt fatigues quickly and returns to sitting position every ~10 seconds. Pt did transfer to [facility] with SPT and RW. Had to return to bed for peri-hygiene assistance d/t fatigue. Clean bed chucks provided. Bed alarm set. Recommend +2 for ambulation.    Kathee Delton Mobility Specialist 10/16/20, 2:05 PM

## 2020-10-17 ENCOUNTER — Telehealth: Payer: Self-pay | Admitting: *Deleted

## 2020-10-17 ENCOUNTER — Other Ambulatory Visit: Payer: Self-pay | Admitting: *Deleted

## 2020-10-17 DIAGNOSIS — I214 Non-ST elevation (NSTEMI) myocardial infarction: Secondary | ICD-10-CM | POA: Diagnosis not present

## 2020-10-17 DIAGNOSIS — I10 Essential (primary) hypertension: Secondary | ICD-10-CM | POA: Diagnosis not present

## 2020-10-17 DIAGNOSIS — C801 Malignant (primary) neoplasm, unspecified: Secondary | ICD-10-CM | POA: Diagnosis not present

## 2020-10-17 DIAGNOSIS — D696 Thrombocytopenia, unspecified: Secondary | ICD-10-CM | POA: Diagnosis not present

## 2020-10-17 DIAGNOSIS — I81 Portal vein thrombosis: Secondary | ICD-10-CM | POA: Diagnosis not present

## 2020-10-17 LAB — CBC
HCT: 28.5 % — ABNORMAL LOW (ref 36.0–46.0)
Hemoglobin: 9 g/dL — ABNORMAL LOW (ref 12.0–15.0)
MCH: 26.2 pg (ref 26.0–34.0)
MCHC: 31.6 g/dL (ref 30.0–36.0)
MCV: 82.8 fL (ref 80.0–100.0)
Platelets: 33 10*3/uL — ABNORMAL LOW (ref 150–400)
RBC: 3.44 MIL/uL — ABNORMAL LOW (ref 3.87–5.11)
RDW: 17.3 % — ABNORMAL HIGH (ref 11.5–15.5)
WBC: 6.7 10*3/uL (ref 4.0–10.5)
nRBC: 0 % (ref 0.0–0.2)

## 2020-10-17 LAB — IMMATURE PLATELET FRACTION: Immature Platelet Fraction: 21.9 % — ABNORMAL HIGH (ref 1.2–8.6)

## 2020-10-17 LAB — BASIC METABOLIC PANEL
Anion gap: 4 — ABNORMAL LOW (ref 5–15)
BUN: 32 mg/dL — ABNORMAL HIGH (ref 8–23)
CO2: 23 mmol/L (ref 22–32)
Calcium: 9 mg/dL (ref 8.9–10.3)
Chloride: 110 mmol/L (ref 98–111)
Creatinine, Ser: 0.83 mg/dL (ref 0.44–1.00)
GFR, Estimated: >60 mL/min (ref >60)
Glucose, Bld: 111 mg/dL — ABNORMAL HIGH (ref 70–99)
Potassium: 4.8 mmol/L (ref 3.5–5.1)
Sodium: 137 mmol/L (ref 135–145)

## 2020-10-17 LAB — MAGNESIUM: Magnesium: 2.2 mg/dL (ref 1.7–2.4)

## 2020-10-17 MED ORDER — POLYETHYLENE GLYCOL 3350 17 G PO PACK: 17.0000 g | PACK | Freq: Two times a day (BID) | ORAL | 0 refills | Status: AC | PRN

## 2020-10-17 MED ORDER — ORAL CARE MOUTH RINSE
15.0000 mL | Freq: Two times a day (BID) | OROMUCOSAL | Status: DC
  Administered 2020-10-17 (×2): 15 mL via OROMUCOSAL

## 2020-10-17 MED ORDER — VITAMIN D 1000 UNITS PO TABS: 1000.0000 [IU] | ORAL_TABLET | Freq: Every day | ORAL | Status: AC

## 2020-10-17 MED ORDER — FERROUS SULFATE 325 (65 FE) MG PO TABS: 325.0000 mg | ORAL_TABLET | Freq: Every day | ORAL | 0 refills | Status: AC

## 2020-10-17 MED ORDER — SODIUM CHLORIDE 0.9 % IV SOLN
300.0000 mg | Freq: Once | INTRAVENOUS | Status: AC
  Administered 2020-10-17: 300 mg via INTRAVENOUS
  Filled 2020-10-17: qty 15

## 2020-10-17 MED ORDER — ASPIRIN 81 MG PO TBEC: 81.0000 mg | DELAYED_RELEASE_TABLET | Freq: Every day | ORAL | 0 refills | Status: AC

## 2020-10-17 NOTE — Telephone Encounter (Signed)
apts and labs scheduled - Per message from Dr. Yevette Edwards   schedule follow up on 3/28- MD ;labs- cbc/cmp; ca-19-9; ca-27-29;cea

## 2020-10-17 NOTE — Care Management Note (Deleted)
Case Management Note  Patient Details  Name: Theresa Andrews MRN: [id number] Date of Birth: 1946/03/23  Subjective/Objective:                    Action/Plan:   Expected Discharge Date:  10/17/20               Expected Discharge Plan:  Home/Self Care  In-House Referral:     Discharge planning Services  NA  Post Acute Care Choice:  NA Choice offered to:  NA  DME Arranged:  N/A DME Agency:  NA  HH Arranged:  NA HH Agency:  NA  Status of Service:     If discussed at [place] of Stay Meetings, dates discussed:    Additional Comments:  [name], RN 10/17/2020, 1:35 PM

## 2020-10-17 NOTE — Progress Notes (Signed)
Theresa Andrews   DOB:1946/07/14   PS#:[id number]    Subjective: Patient denies any further episodes of chest pain.  Denies any nausea vomiting.  Denies any fevers or chills.  She did not have bleeding.  Objective:  Vitals:   10/17/20 0902 10/17/20 1138  BP: (!) 105/57 104/63  Pulse: 86 87  Resp:  18  Temp: 98.1 F (36.7 C) 97.8 F (36.6 C)  SpO2: 99% 98%     Intake/Output Summary (Last 24 hours) at 10/18/2020 0806 Last data filed at 10/17/2020 1025 Gross per 24 hour  Intake --  Output 700 ml  Net -700 ml    Physical Exam HENT:     Head: Normocephalic and atraumatic.     Mouth/Throat:     Pharynx: No oropharyngeal exudate.  Eyes:     Pupils: Pupils are equal, round, and reactive to light.  Cardiovascular:     Rate and Rhythm: Normal rate and regular rhythm.  Pulmonary:     Effort: No respiratory distress.     Breath sounds: No wheezing.     Comments: Decreased breath sounds bilaterally the bases. Abdominal:     General: Bowel sounds are normal. There is no distension.     Palpations: Abdomen is soft. There is no mass.     Tenderness: There is no abdominal tenderness. There is no guarding or rebound.  Musculoskeletal:        General: No tenderness. Normal range of motion.     Cervical back: Normal range of motion and neck supple.  Skin:    General: Skin is warm.  Neurological:     Mental Status: She is alert and oriented to person, place, and time.  Psychiatric:        Mood and Affect: Affect normal.      Labs:  Lab Results  Component Value Date   WBC 6.7 10/17/2020   HGB 9.0 (L) 10/17/2020   HCT 28.5 (L) 10/17/2020   MCV 82.8 10/17/2020   PLT 33 (L) 10/17/2020   NEUTROABS 13.1 (H) 10/14/2020    Lab Results  Component Value Date   NA 137 10/17/2020   K 4.8 10/17/2020   CL 110 10/17/2020   CO2 23 10/17/2020    Studies:  ECHOCARDIOGRAM COMPLETE  Result Date: 10/16/2020    ECHOCARDIOGRAM REPORT   Patient Name:   Theresa Andrews Date of Exam: 10/16/2020  Medical Rec #:  [id number]      Height:       64.0 in Accession #:    [id number]     Weight:       157.6 lb Date of Birth:  25-Dec-1945       BSA:          1.768 m Patient Age:    75 years       BP:           136/69 mmHg Patient Gender: F              HR:           88 bpm. Exam Location:  [facility] Procedure: 2D Echo, Color Doppler, Cardiac Doppler and Intracardiac            Opacification Agent Indications:     I21.4 NSTEMI; R07.9 Chest Pain  History:         Patient has no prior history of Echocardiogram examinations.                  Previous Myocardial  Infarction and CAD; Risk                  Factors:Hypertension and Dyslipidemia. Hx of chemotherapy.  Sonographer:     Charmayne Sheer RDCS (AE) Referring Phys:  [id number] Collier Bullock Diagnosing Phys: Bartholome Bill MD  Sonographer Comments: Suboptimal apical window and suboptimal subcostal window. IMPRESSIONS  1. Left ventricular ejection fraction, by estimation, is 65 to 70%. The left ventricle has normal function. The left ventricle has no regional wall motion abnormalities. Left ventricular diastolic parameters are consistent with Grade I diastolic dysfunction (impaired relaxation).  2. Right ventricular systolic function is normal. The right ventricular size is normal.  3. The mitral valve is grossly normal. Mild mitral valve regurgitation.  4. The aortic valve is tricuspid. Aortic valve regurgitation is trivial. FINDINGS  Left Ventricle: Left ventricular ejection fraction, by estimation, is 65 to 70%. The left ventricle has normal function. The left ventricle has no regional wall motion abnormalities. Definity contrast agent was given IV to delineate the left ventricular  endocardial borders. The left ventricular internal cavity size was normal in size. There is no left ventricular hypertrophy. Left ventricular diastolic parameters are consistent with Grade I diastolic dysfunction (impaired relaxation). Right Ventricle: The right ventricular size is normal. No increase in  right ventricular wall thickness. Right ventricular systolic function is normal. Left Atrium: Left atrial size was normal in size. Right Atrium: Right atrial size was normal in size. Pericardium: There is no evidence of pericardial effusion. Mitral Valve: The mitral valve is grossly normal. Mild mitral valve regurgitation. MV peak gradient, 3.6 mmHg. The mean mitral valve gradient is 1.0 mmHg. Tricuspid Valve: The tricuspid valve is grossly normal. Tricuspid valve regurgitation is mild. Aortic Valve: The aortic valve is tricuspid. Aortic valve regurgitation is trivial. Aortic valve mean gradient measures 4.0 mmHg. Aortic valve peak gradient measures 7.4 mmHg. Aortic valve area, by VTI measures 1.76 cm. Pulmonic Valve: The pulmonic valve was not well visualized. Pulmonic valve regurgitation is trivial. Aorta: The aortic root is normal in size and structure. IAS/Shunts: The interatrial septum was not assessed.  LEFT VENTRICLE PLAX 2D LVIDd:         3.40 cm  Diastology LVIDs:         2.10 cm  LV e' medial:    7.07 cm/s LV PW:         0.90 cm  LV E/e' medial:  8.0 LV IVS:        0.70 cm  LV e' lateral:   8.70 cm/s LVOT diam:     1.60 cm  LV E/e' lateral: 6.5 LV SV:         38 LV SV Index:   22 LVOT Area:     2.01 cm  RIGHT VENTRICLE RV Basal diam:  2.60 cm LEFT ATRIUM             Index LA diam:        3.00 cm 1.70 cm/m LA Vol (A2C):   29.6 ml 16.74 ml/m LA Vol (A4C):   27.0 ml 15.27 ml/m LA Biplane Vol: 28.2 ml 15.95 ml/m  AORTIC VALVE                   PULMONIC VALVE AV Area (Vmax):    1.55 cm    PV Vmax:       1.22 m/s AV Area (Vmean):   1.57 cm    PV Vmean:      86.600 cm/s AV Area (  VTI):     1.76 cm    PV VTI:        0.203 m AV Vmax:           136.00 cm/s PV Peak grad:  6.0 mmHg AV Vmean:          95.500 cm/s PV Mean grad:  3.0 mmHg AV VTI:            0.217 m AV Peak Grad:      7.4 mmHg AV Mean Grad:      4.0 mmHg LVOT Vmax:         105.00 cm/s LVOT Vmean:        74.400 cm/s LVOT VTI:          0.190 m  LVOT/AV VTI ratio: 0.88  AORTA Ao Root diam: 2.80 cm MITRAL VALVE MV Area (PHT): 2.77 cm    SHUNTS MV Area VTI:   2.03 cm    Systemic VTI:  0.19 m MV Peak grad:  3.6 mmHg    Systemic Diam: 1.60 cm MV Mean grad:  1.0 mmHg MV Vmax:       0.95 m/s MV Vmean:      49.6 cm/s MV Decel Time: 274 msec MV E velocity: 56.60 cm/s MV A velocity: 81.40 cm/s MV E/A ratio:  0.70 Bartholome Bill MD Electronically signed by Bartholome Bill MD Signature Date/Time: 10/16/2020/10:09:22 AM    Final     Carcinoma of unknown primary [facility]) #75 year old female patient with a history of carcinoma of unknown primary is currently admitted to hospital for chest pain-noted to have thrombocytopenia:  #Carcinoma of unknown primary-stage IV -possible pancreatic versus left breast/recurrence-currently awaiting ER/PR HER-2/neu status on the chest wall soft tissue specimen.  Currently s/p carbotaxol cycle #1; day-7. Left breast mass-question recurrent malignancy-we will plan outpatient biopsy.  #Thrombocytopenia platelets 33-likely secondary chemotherapy; however immature patient fraction is up.  I would expect platelet count in the next few days.  #Chest pain/non-STEMI/elevated troponins; 2D echo-no significant wall motion abnormalities noted.  Reasonable to continue aspirin 81 mg a day.  #Portal vein thrombosis-recommend continued holding of anticoagulation.  Unless platelets above 50,000.  #Discussed with Dr. Roosevelt Locks; patient could be discharged home; will follow up closely early next week.  I left a message for patient's daughter Theresa Andrews-regarding the above plan.   Cammie Sickle, MD

## 2020-10-17 NOTE — Discharge Summary (Signed)
Physician Discharge Summary  Patient ID: NYCHELLE Andrews MRN: [id number] DOB/AGE: [birth year] 75 y.o.  Admit date: 10/15/2020 Discharge date: 10/17/2020  Admission Diagnoses:  Discharge Diagnoses:  Principal Problem:   NSTEMI (non-ST elevated myocardial infarction) Theresa Andrews) Active Problems:   Atherosclerosis of coronary artery   Benign essential HTN   Portal vein thrombosis   Carcinoma of unknown primary [facility])   Depression   Discharged Condition: good  Hospital Course:  Theresa Hetzer Jonesis a 75 y.o.femalewith medical history significant forleft-sided breast cancer status post lumpectomy, radiation and chemotherapy, history of hypertension, anxiety disorder, dyslipidemia, history of portal vein thrombosis and recently diagnosed metastatic carcinoma with primary either from the breastvs orpancreaticobiliary.Patient received her first chemotherapy on 03/17/22and received treatment with carboplatin and paclitaxel. Patient was seen by her oncologist 1 day prior to admission for evaluation of weakness, poor oral intake and fatigue.Her blood pressure was in the 80s during the office visit and she received IV fluids and instructions to hold her antihypertensive medications. Blood pressure improved with IV hydration and she was discharged home. Patient came back to the hospital complaining of chest pain.  With mild elevation troponin.  #1.  Non-STEMI. Most likely secondary to hypotension and dehydration. Patient has been seen by cardiology, no further work-up is indicated. Continue aspirin and a statin.  2.  Metastatic adenocarcinoma. Thrombocytopenia. Patient received chemotherapy.  Patient had a significant drop of platelets, seen by oncology, thought this is due to chemotherapy.  Patient was Lovenox, will be discontinued this point due to severe thrombocytopenia.  #3.  Essential hypertension. Recent hypotension Continue metoprolol. May restart amlodipine and ARB if blood pressure  goes high again.  #4.  History of portal vein thrombosis. Discontinued heparin due to significant thrombocytopenia  #5.    Deficient anemia plan Patient received IV iron, will continue oral supplement.   Consults: cardiology and hematology/oncology  Significant Diagnostic Studies:   Treatments: Heart monitoring  Discharge Exam: Blood pressure (!) 105/57, pulse 86, temperature 98.1 F (36.7 C), temperature source Oral, resp. rate 18, height 5\' 4"  (1.626 m), weight 71.5 kg, SpO2 99 %. General appearance: alert and cooperative Resp: clear to auscultation bilaterally Cardio: regular rate and rhythm, S1, S2 normal, no murmur, click, rub or gallop GI: soft, non-tender; bowel sounds normal; no masses,  no organomegaly Extremities: extremities normal, atraumatic, no cyanosis or edema  Disposition: Discharge disposition: 01-Home or Self Care       Discharge Instructions    Ambulatory referral to Oncology   Complete by: As directed    Diet - low sodium heart healthy   Complete by: As directed    Increase activity slowly   Complete by: As directed      Allergies as of 10/17/2020      Reactions   Tetanus Toxoids Shortness Of Breath, Rash   Shellfish Allergy       Medication List    STOP taking these medications   amLODipine 10 MG tablet Commonly known as: NORVASC   enoxaparin 80 MG/0.8ML injection Commonly known as: LOVENOX   potassium chloride SA 20 MEQ tablet Commonly known as: KLOR-CON   telmisartan 80 MG tablet Commonly known as: MICARDIS     TAKE these medications   aspirin 81 MG EC tablet Take 1 tablet (81 mg total) by mouth daily. Swallow whole. Start taking on: October 18, 2020   cholecalciferol 1000 units tablet Commonly known as: VITAMIN D Take 1 tablet (1,000 Units total) by mouth daily.   feeding supplement Liqd Take  237 mLs by mouth 2 (two) times daily between meals.   ferrous sulfate 325 (65 FE) MG tablet Take 1 tablet (325 mg total) by  mouth daily.   Lactulose 20 GM/30ML Soln Take 30 mLs (20 g total) by mouth daily as needed.   metoprolol succinate 25 MG 24 hr tablet Commonly known as: TOPROL-XL Take 0.5 tablets (12.5 mg total) by mouth daily.   ondansetron 4 MG tablet Commonly known as: Zofran Take 1 tablet (4 mg total) by mouth daily as needed for nausea or vomiting.   oxyCODONE-acetaminophen 10-325 MG tablet Commonly known as: Percocet Take 1 tablet by mouth every 6 (six) hours as needed for pain.   PARoxetine 10 MG tablet Commonly known as: PAXIL Take 1 tablet (10 mg total) by mouth daily.   polyethylene glycol 17 g packet Commonly known as: MIRALAX / GLYCOLAX Take 17 g by mouth 2 (two) times daily as needed.   rosuvastatin 10 MG tablet Commonly known as: CRESTOR Take 1 tablet (10 mg total) by mouth daily.       Follow-up Information    Steele Sizer, MD Follow up in 1 week(s).   Specialty: Family Medicine Contact information: [address] [place] [address] [phone]               Signed: Sharen Hones 10/17/2020, 9:47 AM

## 2020-10-17 NOTE — Progress Notes (Signed)
Patient Name: Theresa Andrews Date of Encounter: 10/17/2020  Hospital Problem List     Principal Problem:   NSTEMI (non-ST elevated myocardial infarction) [facility]) Active Problems:   Atherosclerosis of coronary artery   Benign essential HTN   Portal vein thrombosis   Carcinoma of unknown primary [facility])   Depression    Patient Profile      75 y.o.femalewith history ofsignificant for breast carcinoma status post lumpectomy radiation and chemotherapy, history of dyslipidemia, history of portal vein thrombosis who presented to the emergency room complaining of chest pain. She recently received chemotherapy including carboplatin and paclitaxel. 1 day prior to admission she was seen for poor oral intake and weakness and fatigue. She was hypotensive in the office and received IV fluids and held her blood pressure medications. She was seen in the cancer center today for follow-up and complained of midsternal chest pain. She had no shortness of breath. She was referred to the emergency room where laboratory showed a potassium of 4.8 creatinine of 0.83 troponin IV 131 and 400. Hemoglobin was normal. EKG showed sinus rhythm with nonspecific ST-T wave changes. No acute ischemia. Chest x-ray revealed a small right pleural effusion. Symptoms have improved since presenting to the emergency room. She is currently being treated with enteric-coated aspirin. Metoprolol succinate 25 daily, rosuvastatin 10 mg daily.  Subjective   Less chest pain this morning.  Still somewhat weak.  Receiving iron infusion.  Inpatient Medications    . aspirin EC  81 mg Oral Daily  . cholecalciferol  1,000 Units Oral Daily  . feeding supplement  237 mL Oral BID BM  . mouth rinse  15 mL Mouth Rinse BID  . metoprolol succinate  12.5 mg Oral Daily  . PARoxetine  10 mg Oral Daily  . polyethylene glycol  17 g Oral Daily  . rosuvastatin  10 mg Oral Daily    Vital Signs    Vitals:   10/17/20 0400 10/17/20  0858 10/17/20 0902 10/17/20 1138  BP: 114/72  (!) 105/57 104/63  Pulse: 87  86 87  Resp: 18   18  Temp: 98.4 F (36.9 C)  98.1 F (36.7 C) 97.8 F (36.6 C)  TempSrc: Oral  Oral Oral  SpO2: 96%  99% 98%  Weight:  71.5 kg    Height:        Intake/Output Summary (Last 24 hours) at 10/17/2020 1229 Last data filed at 10/17/2020 1025 Gross per 24 hour  Intake --  Output 1225 ml  Net -1225 ml   Filed Weights   10/16/20 0324 10/16/20 1025 10/17/20 0858  Weight: 71.5 kg 69.2 kg 71.5 kg    Physical Exam    GEN: Well nourished, well developed, in no acute distress.  HEENT: normal.  Neck: Supple, no JVD, carotid bruits, or masses. Cardiac: RRR, no murmurs, rubs, or gallops. No clubbing, cyanosis, edema.  Radials/DP/PT 2+ and equal bilaterally.  Respiratory:  Respirations regular and unlabored, clear to auscultation bilaterally. GI: Soft, nontender, nondistended, BS + x 4. MS: no deformity or atrophy. Skin: warm and dry, no rash. Neuro:  Strength and sensation are intact. Psych: Normal affect.  Labs    CBC Recent Labs    10/14/20 1448 10/15/20 1130 10/16/20 0602 10/17/20 0616  WBC 14.2*   < > 8.2 6.7  NEUTROABS 13.1*  --   --   --   HGB 10.4*   < > 9.5* 9.0*  HCT 33.6*   < > 30.7* 28.5*  MCV 84.4   < >  84.3 82.8  PLT PLATELET CLUMPS NOTED ON SMEAR, UNABLE TO ESTIMATE   < > 38* 33*   < > = values in this interval not displayed.   Basic Metabolic Panel Recent Labs    10/15/20 1130 10/17/20 0616  NA 136 137  K 4.8 4.8  CL 108 110  CO2 19* 23  GLUCOSE 123* 111*  BUN 38* 32*  CREATININE 0.83 0.83  CALCIUM 8.9 9.0  MG  --  2.2   Liver Function Tests Recent Labs    10/14/20 1448  AST 30  ALT 20  ALKPHOS 92  BILITOT 0.8  PROT 6.1*  ALBUMIN 2.7*   Recent Labs    10/15/20 1130  LIPASE 39   Cardiac Enzymes No results for input(s): CKTOTAL, CKMB, CKMBINDEX, TROPONINI in the last 72 hours. BNP No results for input(s): BNP in the last 72  hours. D-Dimer No results for input(s): DDIMER in the last 72 hours. Hemoglobin A1C No results for input(s): HGBA1C in the last 72 hours. Fasting Lipid Panel No results for input(s): CHOL, HDL, LDLCALC, TRIG, CHOLHDL, LDLDIRECT in the last 72 hours. Thyroid Function Tests No results for input(s): TSH, T4TOTAL, T3FREE, THYROIDAB in the last 72 hours.  Invalid input(s): FREET3  Telemetry    Normal sinus rhythm  ECG       Radiology    DG Chest 2 View  Result Date: 10/15/2020 CLINICAL DATA:  Chest pain for 30 minutes, upper chest pain. EXAM: CHEST - 2 VIEW COMPARISON:  October 09, 2020 FINDINGS: Trachea midline. Low lung volumes. Cardiomediastinal contours are stable and accentuated by low depth of expansion. Calcified atheromatous plaque in the thoracic aorta. Basilar opacities. Increased basilar opacification on the RIGHT as compared to the previous study. Persistent linear opacity in the RIGHT mid chest. Effusion seen on lateral radiograph with blunting of RIGHT costodiaphragmatic sulcus. Likely increased slightly since the prior study. On limited assessment no acute skeletal process. IMPRESSION: Small RIGHT effusion slightly increased with increased basilar opacity at the RIGHT lung base may reflect developing infection or worsening volume loss in the setting of diminished lung volumes. Electronically Signed   By: Zetta Bills M.D.   On: 10/15/2020 12:44   DG Chest 2 View  Result Date: 10/04/2020 CLINICAL DATA:  Status post right chest wall mass biopsy EXAM: CHEST - 2 VIEW COMPARISON:  Chest radiograph March 26, 2010 FINDINGS: The heart size and mediastinal contours are within normal limits. Calcifications aortic arch. No visible pneumothorax. Right lower lobe airspace opacity. Small right pleural effusion. No visible pneumothorax. Thoracic spondylosis. IMPRESSION: Right lower lobe atelectasis with right pleural effusion. No visible pneumothorax. Electronically Signed   By: Dahlia Bailiff MD   On: 10/04/2020 12:36   CT Head Wo Contrast  Result Date: 10/03/2020 CLINICAL DATA:  Headache, intracranial hemorrhage suspected EXAM: CT HEAD WITHOUT CONTRAST TECHNIQUE: Contiguous axial images were obtained from the base of the skull through the vertex without intravenous contrast. COMPARISON:  10/01/2020 FINDINGS: Brain: No evidence of acute infarction, hemorrhage, hydrocephalus, extra-axial collection or mass lesion/mass effect. Mild periventricular and deep white matter hypodensity. Vascular: No hyperdense vessel or unexpected calcification. Skull: Normal. Negative for fracture or focal lesion. Sinuses/Orbits: No acute finding. Other: None. IMPRESSION: 1. No acute intracranial pathology. No non-contrast CT findings to explain headache. Specifically, no evidence of intracranial hemorrhage. 2.  Small-vessel white matter disease. Electronically Signed   By: Eddie Candle M.D.   On: 10/03/2020 12:06   CT Head Wo Contrast  Result  Date: 10/01/2020 CLINICAL DATA:  Altered mental status, unsteady gait EXAM: CT HEAD WITHOUT CONTRAST TECHNIQUE: Contiguous axial images were obtained from the base of the skull through the vertex without intravenous contrast. COMPARISON:  None. FINDINGS: Brain: No evidence of acute infarction, hemorrhage, hydrocephalus, extra-axial collection or mass lesion/mass effect. Scattered low-density changes within the periventricular and subcortical white matter compatible with chronic microvascular ischemic change. Mild diffuse cerebral volume loss. Vascular: Atherosclerotic calcifications involving the large vessels of the skull base. No unexpected hyperdense vessel. Skull: Normal. Negative for fracture or focal lesion. Sinuses/Orbits: No acute finding. Other: None. IMPRESSION: 1. No acute intracranial findings. 2. Mild chronic microvascular ischemic change and cerebral volume loss. Electronically Signed   By: Davina Poke D.O.   On: 10/01/2020 16:28   CT Head W or Wo  Contrast  Result Date: 10/09/2020 CLINICAL DATA:  Mental status change EXAM: CT HEAD WITHOUT AND WITH CONTRAST TECHNIQUE: Contiguous axial images were obtained from the base of the skull through the vertex without and with intravenous contrast CONTRAST:  148mL OMNIPAQUE IOHEXOL 300 MG/ML  SOLN COMPARISON:  10/03/2020 FINDINGS: Brain: There is no acute intracranial hemorrhage, mass, mass effect, or edema. No abnormal enhancement. Gray-white differentiation is preserved. There is no extra-axial fluid collection. Ventricles and sulci are stable in size and configuration. Patchy hypoattenuation in the supratentorial white matter is nonspecific but likely reflects stable mild chronic microvascular ischemic changes. Vascular: There is atherosclerotic calcification at the skull base. Skull: Calvarium is unremarkable. Sinuses/Orbits: No acute finding. Other: None. IMPRESSION: No acute intracranial abnormality.  No abnormal enhancement. Electronically Signed   By: Macy Mis M.D.   On: 10/09/2020 09:55   CT ABDOMEN PELVIS W CONTRAST  Result Date: 10/09/2020 CLINICAL DATA:  Confusion, abdominal bloating, pain EXAM: CT ABDOMEN AND PELVIS WITH CONTRAST TECHNIQUE: Multidetector CT imaging of the abdomen and pelvis was performed using the standard protocol following bolus administration of intravenous contrast. CONTRAST:  14mL OMNIPAQUE IOHEXOL 300 MG/ML  SOLN COMPARISON:  10/06/2020 FINDINGS: Lower chest: Moderate right pleural effusion. Compressive atelectasis in the right lower lobe. Stable mass in a right lower costochondral junction is unchanged. Postsurgical changes in the left breast. This is unchanged. Hepatobiliary: No change in the multiple masses throughout the liver presumably metastases. Dilated and thrombosed portal vein again noted with cavernous transformation, stable. Pancreas: Stable dilated pancreatic duct. Stable pancreatic body and pancreatic tail masses. Spleen: No focal abnormality.  Normal size.  Adrenals/Urinary Tract: Stable low-density lesion off the upper pole of the left kidney measuring 1.8 cm. No hydronephrosis. Adrenal glands and urinary bladder unremarkable. Stomach/Bowel: Moderate stool throughout the colon. No evidence of bowel obstruction. Vascular/Lymphatic: Aortic atherosclerosis. Upper abdominal adenopathy again noted, the largest a portacaval node measuring up to 3.9 cm. Retroperitoneal adenopathy, stable. Reproductive: Prior hysterectomy Other: Large volume ascites in the abdomen and pelvis. Nodularity throughout the mesentery and omentum, most pronounced in the right abdomen and upper abdomen most likely reflects malignant ascites and peritoneal spread of disease. This may also be reflected within umbilical nodule, stable. Musculoskeletal: No acute bony abnormality or focal lesion. IMPRESSION: Extensive metastatic disease throughout the abdomen as described above. Findings stable since prior study. Chronic portal vein thrombosis with cavernous transformation, stable. Upper abdominal and retroperitoneal adenopathy, stable. Large volume ascites, likely malignant ascites, stable. No evidence of bowel obstruction. Stable moderate right pleural effusion with right lower lobe atelectasis. Electronically Signed   By: Rolm Baptise M.D.   On: 10/09/2020 10:15   CT ABDOMEN PELVIS W CONTRAST  Result Date: 10/06/2020 CLINICAL DATA:  Abdominal swelling, vomiting, history of left breast cancer, metastatic disease on previous CT EXAM: CT ABDOMEN AND PELVIS WITH CONTRAST TECHNIQUE: Multidetector CT imaging of the abdomen and pelvis was performed using the standard protocol following bolus administration of intravenous contrast. CONTRAST:  118mL OMNIPAQUE IOHEXOL 300 MG/ML  SOLN COMPARISON:  10/03/2020 FINDINGS: Lower chest: Stable right pleural effusion and right lower lobe atelectasis. The soft tissue mass at the right anterior sixth costochondral junction is unchanged, please correlate with recent  biopsy results. Chronic postsurgical changes left breast. Hepatobiliary: No change in the multiple confluent hypodense masses within the liver consistent with presumed metastases. Dilated thrombosed portal vein again noted. Pancreas: There is marked pancreatic parenchymal atrophy. There are multiple pancreatic masses identified. In the pancreatic tail hypodense mass measures approximately 2.4 x 1.8 cm image 24/2. Hypodense mass in the dorsal aspect of the pancreatic body image 28/2 measures 2.2 x 1.9 cm, with hyperdense margins. Pancreatic duct dilation unchanged. Spleen: Spleen demonstrates normal size without focal abnormality. Splenic vein is patent. Adrenals/Urinary Tract: Indeterminate 1.8 cm hypodensity upper pole left kidney with Hounsfield attenuation of 43, stable. No other focal renal abnormalities. No renal tract calculi or obstructive uropathy. The bladder is decompressed with no focal abnormalities. The adrenals are normal. Stomach/Bowel: There is moderate gas and stool throughout the colon which may reflect an element of constipation. No evidence of bowel obstruction or ileus. Vascular/Lymphatic: Extensive atherosclerosis of the aorta is again noted unchanged. Chronic portal vein thrombosis with cavernous transformation again seen. The splenic vein and superior mesenteric vein are patent. Pathologic adenopathy is seen throughout the upper abdomen. 10 mm lymph node in the porta hepatis image 25/2 is stable. Multiple enlarged lymph nodes are seen within the gastrohepatic ligament, largest measuring 11 mm in short axis image 23/2. Necrotic lymph node dorsal to the pancreatic body image 28/2 measures 19 mm in short axis. Aortocaval lymph node image 36/2 measures 10 mm in short axis. Reproductive: Status post hysterectomy. No adnexal masses. Other: Moderate abdominal and pelvic ascites, without appreciable change since prior study. There is some soft tissue nodularity within the right upper quadrant  mesentery, just inferior to the liver and along the hepatic flexure of the colon, concerning for peritoneal implant. No free intraperitoneal gas. Stable umbilical hernia containing mesenteric fat. Inflammatory changes within the hernia may suggest incarceration. Musculoskeletal: No acute or destructive bony lesions. Reconstructed images demonstrate no additional findings. IMPRESSION: 1. Extensive intra-abdominal and intrapelvic metastases, without significant change since prior study. Given the above findings, this may reflect primary pancreatic adenocarcinoma with diffuse metastases. Correlation with recent biopsy results recommended. No significant change in the appearance of the metastatic disease since prior study. 2. Moderate ascites, without significant change since prior study. This is likely malignant ascites given the soft tissue nodularity within the right upper quadrant mesentery. 3. Chronic portal vein thrombosis with cavernous transformation. 4. No evidence of bowel obstruction or ileus. Retained gas and stool throughout the colon may reflect constipation. 5. Stable right pleural effusion. 6.  Aortic Atherosclerosis (ICD10-I70.0). Electronically Signed   By: Randa Ngo M.D.   On: 10/06/2020 19:51   CT ABDOMEN PELVIS W CONTRAST  Addendum Date: 10/03/2020   ADDENDUM REPORT: 10/03/2020 14:10 ADDENDUM: ,There is soft tissue fullness along the anterior right lower rib cartilage, including on 14/2. This is new since 02/19/2020, also highly suspicious for metastatic disease. Electronically Signed   By: Abigail Miyamoto M.D.   On: 10/03/2020 14:10   Result  Date: 10/03/2020 CLINICAL DATA:  Right-sided abdominal pain for about a week with nausea and vomiting x2. Constipation. Remote breast cancer history. EXAM: CT ABDOMEN AND PELVIS WITH CONTRAST TECHNIQUE: Multidetector CT imaging of the abdomen and pelvis was performed using the standard protocol following bolus administration of intravenous contrast.  CONTRAST:  174mL OMNIPAQUE IOHEXOL 300 MG/ML  SOLN COMPARISON:  Multiple priors, including MRCP of 09/26/2020, abdominal CT of 02/19/2020, abdominal MRI 03/07/2020. FINDINGS: Lower chest: Right base atelectasis. Small right pleural effusion is new since the prior CT. Normal heart size with multivessel coronary artery atherosclerosis. Left-sided calcification or surgical clips. Deep to the presumed lumpectomy site is a centrally hypoattenuating lesion of 2.8 x 2.2 cm on 05/02. This is likely not imaged on the prior CT, but present on the 03/07/2020 MRI where it was grossly similar. Hepatobiliary: Multiple hepatic masses are again identified. A high right hepatic lobe lesion measures 2.6 cm on 13/2 versus 1.8 cm on 02/19/2020. A wedge-shaped mass within segments 4 and 2 of the liver measures on the order of 10.5 x 5.1 cm on 19/2. Significantly enlarged from 02/19/2020, where lesions in this region measured up to 3.0 cm. Increased from on the order of 3.3 cm on 03/07/2020. Cholecystectomy without biliary duct dilatation. Pancreas: The pancreas demonstrates similar atrophy with moderate duct dilatation at 9 mm on 29/2. Duct dilatation is followed to the level of the pancreatic head, with there is mild soft tissue fullness but no well-defined mass. Example 34/2. This is unchanged. The pancreatic tail demonstrates developing relative soft tissue fullness compared to the pancreatic body. Example at on the order of 2.3 cm in thickness on 25/2 versus 1.4 cm on 02/19/2020. Spleen: Normal in size, without focal abnormality. Adrenals/Urinary Tract: Normal adrenal glands. Upper pole left renal lesion of 1.6 cm is greater than fluid density but was determined a complex cyst on prior MRI. Normal right kidney. Contrast in the urinary bladder secondary to reported outside CT performed yesterday. Stomach/Bowel: Tiny hiatal hernia. Normal distal stomach. Moderate amount of stool within the rectum. Scattered colonic diverticula. Normal  terminal ileum. Normal small bowel caliber. Vascular/Lymphatic: Aortic atherosclerosis. Diffuse portal vein thrombosis again identified. Thrombus involves the superior aspect of the superior mesenteric vein, chronic. New and progressive abdominal adenopathy. An aortocaval node measures 1.0 cm on 39/2 versus 7 mm on 02/19/2020 (when remeasured). A necrotic node along the celiac, just posterior to the pancreatic body measures 1.9 cm on 30/2 and is new. Developing adenopathy in the gastrohepatic ligament including at 1.0 cm on 24/2. More ill-defined hypoattenuating lesions in the peripancreatic space, including at 2.6 cm in the portacaval space on 29/2, increased from 2.3 cm on 02/19/2020. No pelvic sidewall adenopathy. Reproductive: Hysterectomy.  No adnexal mass. Other: Since 02/19/2020, development of small volume abdominopelvic ascites. Peritoneal nodularity, including within the pelvic cul-de-sac on 78/2 and along the right-side of the abdomen on 39/2. No free intraperitoneal air. A new periumbilical nodule or node measures 1.9 cm on 57/2. Musculoskeletal: No acute osseous abnormality. Trace L4-5 anterolisthesis. Degenerate disc disease at the lumbosacral junction. IMPRESSION: 1. When compared to multiple prior exams, including most direct comparison to the 02/19/2020 CT, new or progressive metastatic disease, most definite involving necrotic abdominal nodes and the peritoneum as detailed above. Developing periumbilical nodularity, also likely metastatic. 2. Progressive liver lesions, also most likely indicative of metastatic disease. In the setting of portal vein thrombosis, progressive abscesses are possible but less likely. 3. Abnormal appearance of the pancreas, at least partially felt  to be attributed to chronic pancreatitis. Developing soft tissue fullness within the pancreatic tail, with considerations of primary adenocarcinoma, metastatic disease, or superimposed acute pancreatitis. Peripancreatic  hypoattenuating lesions, some of which are increased compared to 02/19/2020. These could be secondary to prior pancreatitis or also represent necrotic nodal metastasis. 4. Left breast mass in the setting of prior lumpectomy for carcinoma. Suspicious for new breast primary, incompletely imaged. 5.  Possible constipation. 6. New small volume abdominopelvic ascites. 7. Similar small right pleural effusion compared to the MRI of 09/26/2020. Electronically Signed: By: Abigail Miyamoto M.D. On: 10/03/2020 12:32   MR ABDOMEN MRCP WO CONTRAST  Result Date: 09/27/2020 CLINICAL DATA:  Pancreatitis. Pancreatic and liver lesions on prior MRI. Abdominal pain. EXAM: MRI ABDOMEN WITHOUT CONTRAST  (INCLUDING MRCP) TECHNIQUE: Multiplanar multisequence MR imaging of the abdomen was performed. Heavily T2-weighted images of the biliary and pancreatic ducts were obtained, and three-dimensional MRCP images were rendered by post processing. COMPARISON:  03/07/2020 MRI FINDINGS: Despite efforts by the technologist and patient, severe motion artifact is present on today's exam and could not be eliminated. This reduces exam sensitivity and specificity. Due to pain, the patient terminated the exam prior to obtaining full diagnostic sequences. Lower chest: Small right pleural effusion is new compared to prior MRI. Hepatobiliary: There is been evolution of the previous regions of stippled T2 signal hyperintensity scattered in the liver to current appearance of hazy accentuated T2 signal for example on image 9 of series 4. On biopsy, these were felt to represent areas of periductal fibrosis, chronic lymphoplasmacytic infiltrate, hemosiderin deposition, and regenerative hyperplasia suggesting finding secondary to portal vein thrombosis. This process is significantly expanded throughout segment 4a and 4B of the liver which not demonstrates diffuse hazy accentuated T2 signal as shown on image 14 series 4. There is associated reduced T1 signal in  these regions of involvement. No steatosis. There is some associated restriction of diffusion in these regions of involvement. Unfortunately the MRCP images are severely blurred. No obvious extrahepatic biliary dilatation. There is some mild narrowing of the common hepatic duct along the porta hepatis for example on image 16 series 3, probably due to surrounding soft tissue swelling and possibly periportal fibrosis. As before, there is high T2 signal intensity material throughout the somewhat dilated intrahepatic portal venous system probably from thrombosis. Periportal edema noted. Pancreas: Peripancreatic edema noted with stippled accentuated T2 signal in the pancreas, and substantially dilated segments of the dorsal pancreatic duct especially in the pancreatic body, up to 1.0 cm in diameter. T2 signal hyperintensity partially involving the pancreatic head measuring about 4.5 by 1.6 by 2.6 cm, extending cephalad along the porta hepatis, I am uncertain whether this represents a thrombosed varix from the portal vein, pseudocyst, or a cystic pancreatic neoplasm. This appears roughly similar to the prior exam. Spleen:  Unremarkable Adrenals/Urinary Tract: Small renal lesions of varying complexity are present and probably a combination of simple and complex cysts, although enhancement characteristics are not interrogated today. The adrenal glands appear normal. Stomach/Bowel: Unremarkable Vascular/Lymphatic: Aortoiliac atherosclerotic vascular disease. 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 Collaterals along the porta hepatis. Other: Ascites particularly along the paracolic gutters and in the pelvis (pelvic ascites seen on coronal images). Mesenteric edema mildly improved in the central mesentery compared to previous. Omental edema noted. There is hazy stranding in the root of the mesentery as on prior  exams. Musculoskeletal: Lower lumbar spondylosis  and degenerative disc disease. IMPRESSION: 1. Despite efforts by the technologist and patient, severe motion artifact is present on today's exam and could not be eliminated. The patient was also unable to complete the full sequence of the exam. This reduces exam sensitivity and specificity. 2. Evolution of the appearance in the liver with the previously stippled T2 signal hyperintensities now appearing as hazy increased T2 signal, and with substantially increased involvement throughout segment 4 and 4B of the liver. Based on prior biopsy results this likely represents combination of fibrosis, lymphoplasmacytic infiltrate, hemosiderin deposition, and periportal hyperplasia resulting from portal vein thrombosis. 3. 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 No obvious extrahepatic biliary dilatation. There is some mild narrowing of the common hepatic duct along the porta hepatis, probably due to surrounding soft tissue swelling and possibly periportal fibrosis. 4. Peripancreatic edema with stippled accentuated T2 signal in the pancreas, and substantially dilated segments of the dorsal pancreatic duct especially in the pancreatic body. 5. Lobulated T2 signal hyperintensity along the pancreatic head, possibly a pseudocyst or a thrombosed varix of the portal vein, less likely cystic pancreatic neoplasm. 6. Small right pleural effusion is new compared to previous MRI. 7. Reduced central mesenteric edema compared to previous, although there is still some peripancreatic edema as well as mesenteric edema and ascites. 8. Lower lumbar spondylosis and degenerative disc disease. Electronically Signed   By: Van Clines M.D.   On: 09/27/2020 16:11   US Paracentesis  Result Date: 10/09/2020 INDICATION: 75 year old female with history of breast cancer and  abdominal pain found to have large volume ascites. EXAM: ULTRASOUND GUIDED left lower quadrant PARACENTESIS MEDICATIONS: None. COMPLICATIONS: None immediate. PROCEDURE: Informed written consent was obtained from the patient after a discussion of the risks, benefits and alternatives to treatment. A timeout was performed prior to the initiation of the procedure. Initial ultrasound scanning demonstrates a large amount of ascites within the left lower abdominal quadrant. The right lower abdomen was prepped and draped in the usual sterile fashion. 1% lidocaine was used for local anesthesia. Following this, a 6 Fr Safe-T-Centesis catheter was introduced. An ultrasound image was saved for documentation purposes. The paracentesis was performed. The catheter was removed and a dressing was applied. The patient tolerated the procedure well without immediate post procedural complication. FINDINGS: A total of approximately 3.05 L of translucent, straw-colored fluid was removed. Samples were sent to the laboratory as requested by the clinical team. IMPRESSION: Successful ultrasound-guided paracentesis yielding 3.05 liters of peritoneal fluid. Ruthann Cancer, MD [facility] Electronically Signed   By: Ruthann Cancer MD   On: 10/09/2020 15:53   DG Chest Portable 1 View  Result Date: 10/09/2020 CLINICAL DATA:  Altered mental status.  Rule out pneumonia EXAM: PORTABLE CHEST 1 VIEW COMPARISON:  10/04/2020 FINDINGS: Progression of right lower lobe airspace disease.  No effusion Left lung remains clear.  Negative for heart failure. IMPRESSION: Progression of right lower lobe airspace disease which may represent atelectasis or pneumonia. Electronically Signed   By: Franchot Gallo M.D.   On: 10/09/2020 10:11   ECHOCARDIOGRAM COMPLETE  Result Date: 10/16/2020    ECHOCARDIOGRAM REPORT   Patient Name:   Theresa Andrews Date of Exam: 10/16/2020 Medical Rec #:  [id number]       Height:       64.0 in Accession #:    [id number]  Weight:       157.6 lb Date of Birth:  1945-11-18       BSA:          1.768 m Patient Age:    35 years       BP:           136/69 mmHg Patient Gender: F              HR:           88 bpm. Exam Location:  [facility] Procedure: 2D Echo, Color Doppler, Cardiac Doppler and Intracardiac            Opacification Agent Indications:     I21.4 NSTEMI; R07.9 Chest Pain  History:         Patient has no prior history of Echocardiogram examinations.                  Previous Myocardial Infarction and CAD; Risk                  Factors:Hypertension and Dyslipidemia. Hx of chemotherapy.  Sonographer:     Charmayne Sheer RDCS (AE) Referring Phys:  [id number] Collier Bullock Diagnosing Phys: Bartholome Bill MD  Sonographer Comments: Suboptimal apical window and suboptimal subcostal window. IMPRESSIONS  1. Left ventricular ejection fraction, by estimation, is 65 to 70%. The left ventricle has normal function. The left ventricle has no regional wall motion abnormalities. Left ventricular diastolic parameters are consistent with Grade I diastolic dysfunction (impaired relaxation).  2. Right ventricular systolic function is normal. The right ventricular size is normal.  3. The mitral valve is grossly normal. Mild mitral valve regurgitation.  4. The aortic valve is tricuspid. Aortic valve regurgitation is trivial. FINDINGS  Left Ventricle: Left ventricular ejection fraction, by estimation, is 65 to 70%. The left ventricle has normal function. The left ventricle has no regional wall motion abnormalities. Definity contrast agent was given IV to delineate the left ventricular  endocardial borders. The left ventricular internal cavity size was normal in size. There is no left ventricular hypertrophy. Left ventricular diastolic parameters are consistent with Grade I diastolic dysfunction (impaired relaxation). Right Ventricle: The right ventricular size is normal. No increase in right ventricular wall  thickness. Right ventricular systolic function is normal. Left Atrium: Left atrial size was normal in size. Right Atrium: Right atrial size was normal in size. Pericardium: There is no evidence of pericardial effusion. Mitral Valve: The mitral valve is grossly normal. Mild mitral valve regurgitation. MV peak gradient, 3.6 mmHg. The mean mitral valve gradient is 1.0 mmHg. Tricuspid Valve: The tricuspid valve is grossly normal. Tricuspid valve regurgitation is mild. Aortic Valve: The aortic valve is tricuspid. Aortic valve regurgitation is trivial. Aortic valve mean gradient measures 4.0 mmHg. Aortic valve peak gradient measures 7.4 mmHg. Aortic valve area, by VTI measures 1.76 cm. Pulmonic Valve: The pulmonic valve was not well visualized. Pulmonic valve regurgitation is trivial. Aorta: The aortic root is normal in size and structure. IAS/Shunts: The interatrial septum was not assessed.  LEFT VENTRICLE PLAX 2D LVIDd:         3.40 cm  Diastology LVIDs:         2.10 cm  LV e' medial:    7.07 cm/s LV PW:         0.90 cm  LV E/e' medial:  8.0 LV IVS:        0.70 cm  LV e' lateral:   8.70 cm/s LVOT diam:  1.60 cm  LV E/e' lateral: 6.5 LV SV:         38 LV SV Index:   22 LVOT Area:     2.01 cm  RIGHT VENTRICLE RV Basal diam:  2.60 cm LEFT ATRIUM             Index LA diam:        3.00 cm 1.70 cm/m LA Vol (A2C):   29.6 ml 16.74 ml/m LA Vol (A4C):   27.0 ml 15.27 ml/m LA Biplane Vol: 28.2 ml 15.95 ml/m  AORTIC VALVE                   PULMONIC VALVE AV Area (Vmax):    1.55 cm    PV Vmax:       1.22 m/s AV Area (Vmean):   1.57 cm    PV Vmean:      86.600 cm/s AV Area (VTI):     1.76 cm    PV VTI:        0.203 m AV Vmax:           136.00 cm/s PV Peak grad:  6.0 mmHg AV Vmean:          95.500 cm/s PV Mean grad:  3.0 mmHg AV VTI:            0.217 m AV Peak Grad:      7.4 mmHg AV Mean Grad:      4.0 mmHg LVOT Vmax:         105.00 cm/s LVOT Vmean:        74.400 cm/s LVOT VTI:          0.190 m LVOT/AV VTI ratio: 0.88   AORTA Ao Root diam: 2.80 cm MITRAL VALVE MV Area (PHT): 2.77 cm    SHUNTS MV Area VTI:   2.03 cm    Systemic VTI:  0.19 m MV Peak grad:  3.6 mmHg    Systemic Diam: 1.60 cm MV Mean grad:  1.0 mmHg MV Vmax:       0.95 m/s MV Vmean:      49.6 cm/s MV Decel Time: 274 msec MV E velocity: 56.60 cm/s MV A velocity: 81.40 cm/s MV E/A ratio:  0.70 Bartholome Bill MD Electronically signed by Bartholome Bill MD Signature Date/Time: 10/16/2020/10:09:22 AM    Final    [place] CORE BIOPSY (SOFT TISSUE)  Result Date: 10/04/2020 INDICATION: 75 year old woman with history of breast cancer presented to interventional radiology for biopsy of new right chest wall mass. EXAM: Ultrasound-guided biopsy of right chest wall mass MEDICATIONS: None. ANESTHESIA/SEDATION: Moderate (conscious) sedation was employed during this procedure. A total of Versed 2 mg and Fentanyl 100 mcg was administered intravenously. Moderate Sedation Time: 8 minutes. The patient's level of consciousness and vital signs were monitored continuously by radiology nursing throughout the procedure under my direct supervision. COMPLICATIONS: None immediate. PROCEDURE: Informed written consent was obtained from the patient after a thorough discussion of the procedural risks, benefits and alternatives. All questions were addressed. Maximal Sterile Barrier Technique was utilized including caps, mask, sterile gowns, sterile gloves, sterile drape, hand hygiene and skin antiseptic. A timeout was performed prior to the initiation of the procedure. Patient position supine on the ultrasound table. Right anterior lower chest wall skin prepped and draped in usual sterile fashion. Following local lidocaine administration, 18 gauge biopsy needle was advanced into the right anterior chest wall mass, and four cores were obtained utilizing continuous ultrasound guidance. Samples were sent  to pathology in formalin. Needle removed and hemostasis achieved with 5 minutes of manual compression. Post  procedure ultrasound images showed no evidence of significant hemorrhage. IMPRESSION: Ultrasound-guided biopsy of right anterior chest wall mass. Electronically Signed   By: Miachel Roux M.D.   On: 10/04/2020 17:04   [place] EKG SITE RITE  Result Date: 10/15/2020 If [facility] image not attached, placement could not be confirmed due to current cardiac rhythm.   Assessment & Plan    75 year old female with history of metastatic breast carcinoma on chemotherapy currently receiving chemotherapy. She presented after developing weakness and chest heaviness. She was somewhat hypotensive in the oncologist office. EKG on presentation is unremarkable for ischemia. She does have elevated serum troponins however they are flat. Symptoms and EKG as well as troponin not consistent with acute coronary syndrome. We will continue to medical management.  1. Would continue with metoprolol and rosuvastatin as well as aspirin. Not a candidate for invasive evaluation at present given symptoms. Not a candidate for heparin.   Echo revealed normal LV function with no regional wall motion abnormalities.  No  pericardial effusion noted.  No significant valvular abnormalities noted.  No regional wall motion abnormalities.  Appears stable from a cardiac standpoint.  Troponin elevation does not appear to suggest acute coronary syndrome.  Would be okay for discharge from a cardiac standpoint.  2 breast carcinoma-being treated by oncology.  Receiving iron infusion this morning.   Signed, Javier Docker Demetrius Mahler MD 10/17/2020, 12:29 PM  Pager: [phone]

## 2020-10-18 ENCOUNTER — Encounter: Payer: Self-pay | Admitting: Internal Medicine

## 2020-10-18 ENCOUNTER — Telehealth: Payer: Self-pay

## 2020-10-18 ENCOUNTER — Telehealth: Payer: Self-pay | Admitting: *Deleted

## 2020-10-18 ENCOUNTER — Other Ambulatory Visit: Payer: Self-pay | Admitting: *Deleted

## 2020-10-18 LAB — SURGICAL PATHOLOGY

## 2020-10-18 MED ORDER — OXYCODONE-ACETAMINOPHEN 10-325 MG PO TABS: 1.0000 | ORAL_TABLET | Freq: Four times a day (QID) | ORAL | 0 refills | Status: DC | PRN

## 2020-10-18 NOTE — Telephone Encounter (Signed)
Copied from [place] [id number]. Topic: General - Other >> Oct 18, 2020  4:07 PM Pawlus, Brayton Layman A wrote: Reason for CRM: Pts daughter called regarding FMLA paperwork she dropped off. Caller wanted to know the status of this paperwork, please advise.

## 2020-10-18 NOTE — Chronic Care Management (AMB) (Signed)
  Care Management   Note  10/18/2020 Name: Theresa Andrews MRN: [id number] DOB: July 11, 1946  Theresa Andrews is a 75 y.o. year old female who is a primary care patient of Steele Sizer, MD and is actively engaged with the care management team. I reached out to Theresa Andrews by phone today to assist with re-scheduling an initial visit with the RN Case Manager and Licensed Clinical Social Worker  Follow up plan: Telephone appointment with care management team member scheduled for:  Licensed Clinical SW 10/21/2020 RNCM 10/30/2020  [facility]

## 2020-10-21 ENCOUNTER — Inpatient Hospital Stay (HOSPITAL_BASED_OUTPATIENT_CLINIC_OR_DEPARTMENT_OTHER): Payer: Medicare PPO | Admitting: Hospice and Palliative Medicine

## 2020-10-21 ENCOUNTER — Inpatient Hospital Stay: Payer: Medicare PPO

## 2020-10-21 ENCOUNTER — Telehealth: Payer: Self-pay

## 2020-10-21 ENCOUNTER — Other Ambulatory Visit: Payer: Self-pay | Admitting: *Deleted

## 2020-10-21 ENCOUNTER — Inpatient Hospital Stay (HOSPITAL_BASED_OUTPATIENT_CLINIC_OR_DEPARTMENT_OTHER): Payer: Medicare PPO | Admitting: Internal Medicine

## 2020-10-21 ENCOUNTER — Other Ambulatory Visit: Payer: Self-pay

## 2020-10-21 ENCOUNTER — Ambulatory Visit (INDEPENDENT_AMBULATORY_CARE_PROVIDER_SITE_OTHER): Payer: Medicare Other | Admitting: *Deleted

## 2020-10-21 DIAGNOSIS — I10 Essential (primary) hypertension: Secondary | ICD-10-CM

## 2020-10-21 DIAGNOSIS — F419 Anxiety disorder, unspecified: Secondary | ICD-10-CM

## 2020-10-21 DIAGNOSIS — F32A Depression, unspecified: Secondary | ICD-10-CM | POA: Diagnosis not present

## 2020-10-21 DIAGNOSIS — R109 Unspecified abdominal pain: Secondary | ICD-10-CM

## 2020-10-21 DIAGNOSIS — C801 Malignant (primary) neoplasm, unspecified: Secondary | ICD-10-CM

## 2020-10-21 DIAGNOSIS — E860 Dehydration: Secondary | ICD-10-CM | POA: Diagnosis not present

## 2020-10-21 DIAGNOSIS — Z515 Encounter for palliative care: Secondary | ICD-10-CM | POA: Diagnosis not present

## 2020-10-21 DIAGNOSIS — R140 Abdominal distension (gaseous): Secondary | ICD-10-CM

## 2020-10-21 DIAGNOSIS — Z79891 Long term (current) use of opiate analgesic: Secondary | ICD-10-CM | POA: Diagnosis not present

## 2020-10-21 DIAGNOSIS — Z170 Estrogen receptor positive status [ER+]: Secondary | ICD-10-CM

## 2020-10-21 DIAGNOSIS — Z5111 Encounter for antineoplastic chemotherapy: Secondary | ICD-10-CM | POA: Diagnosis not present

## 2020-10-21 DIAGNOSIS — I959 Hypotension, unspecified: Secondary | ICD-10-CM | POA: Diagnosis not present

## 2020-10-21 DIAGNOSIS — D649 Anemia, unspecified: Secondary | ICD-10-CM

## 2020-10-21 DIAGNOSIS — Z79899 Other long term (current) drug therapy: Secondary | ICD-10-CM | POA: Diagnosis not present

## 2020-10-21 DIAGNOSIS — R410 Disorientation, unspecified: Secondary | ICD-10-CM

## 2020-10-21 DIAGNOSIS — E782 Mixed hyperlipidemia: Secondary | ICD-10-CM

## 2020-10-21 DIAGNOSIS — Z9181 History of falling: Secondary | ICD-10-CM

## 2020-10-21 DIAGNOSIS — C50812 Malignant neoplasm of overlapping sites of left female breast: Secondary | ICD-10-CM

## 2020-10-21 DIAGNOSIS — R2681 Unsteadiness on feet: Secondary | ICD-10-CM

## 2020-10-21 MED ORDER — OXYCODONE-ACETAMINOPHEN 10-325 MG PO TABS: 1.0000 | ORAL_TABLET | ORAL | 0 refills | Status: AC | PRN

## 2020-10-21 MED ORDER — LORAZEPAM 0.5 MG PO TABS: 0.5000 mg | ORAL_TABLET | Freq: Three times a day (TID) | ORAL | 0 refills | Status: DC | PRN

## 2020-10-21 NOTE — Telephone Encounter (Signed)
Pt has a paracentesis scheduled for 3/30 at 2:30 arrive at 2p. Pts daughter Lattie Haw aware of this information and expressed understanding.

## 2020-10-21 NOTE — Progress Notes (Signed)
[place]  Telephone:([phone] [phone]   Name: Theresa Andrews Date: 10/21/2020 MRN: [id number]  DOB: [birth year]  Patient Care Team: Steele Sizer, MD as PCP - General (Family Medicine) Cammie Sickle, MD as Consulting Physician (Oncology) Corey Skains, MD as Consulting Physician (Cardiology) Gabriel Carina Betsey Holiday, MD as Consulting Physician (Endocrinology) Baxter Kail, MD as Consulting Physician (Dermatology) Anell Barr, OD as Consulting Physician (Optometry) Pieter Partridge, MD as Referring Physician (Dermatology) Vern Claude, LCSW as Social Worker Neldon Labella, RN as Registered Nurse Michaelle Birks, Glean Salvo, [facility] (Pharmacist)    REASON FOR CONSULTATION: Theresa Andrews is a 75 y.o. female with multiple medical problems including history of left-sided breast cancer status post lumpectomy/XRT/chemotherapy, history of parathyroidism, hypertension, anxiety, and hyperlipidemia. She was hospitalized 10/03/2020-10/04/2020 with abd pain and was again readmitted 10/09/2020-10/12/2020 with persistent abdominal pain and altered mental status. CT of the abdomen and pelvis revealed new progressive metastatic disease involving the pancreas.  She underwent biopsy with pathology showing poorly differentiated carcinoma of unknown primary.  Palliative care was consulted help address goals..   SOCIAL HISTORY:     reports that she quit smoking about 14 years ago. Her smoking use included cigarettes. She has a 6.25 pack-year smoking history. She has never used smokeless tobacco. She reports that she does not drink alcohol and does not use drugs.  Patient is divorced.  She lives at home alone.  She has a daughter who lives next door.  She has 2 other daughters who live nearby.  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  ADVANCE DIRECTIVES:  None on file  CODE STATUS: DNR (DNR order  signed on 10/11/20)  PAST MEDICAL HISTORY: Past Medical History:  Diagnosis Date  . Adenocarcinoma of unknown primary ([place])   . Anxiety   . Breast cancer ([place])    pT2 pN0 cM0 (stage IIA) invasive mammary carcinoma of left outer breast status post lumpectomy and sentinel node study on July 14, 2010  . CAD (coronary artery disease)   . Depression   . H/O hypokalemia   . History of chemotherapy   . History of MI (myocardial infarction)   . Hyperglycemia   . Hyperlipidemia   . Hypertension   . Peripheral neuropathy due to chemotherapy ([place])   . Personal history of chemotherapy 2011   left breast ca  . Personal history of radiation therapy 2001   left breast ca    PAST SURGICAL HISTORY:  Past Surgical History:  Procedure Laterality Date  . ABDOMINAL HYSTERECTOMY    . BREAST BIOPSY Left 2008   neg  . BREAST BIOPSY Left 03/05/2010   invasive mammary carcinoma  . BREAST LUMPECTOMY Left 07/14/2010   INVASIVE MAMMARY CARCINOMA WITH PROMINENT INFILTRATIVE PATTERN, negative margins  . CHOLECYSTECTOMY    . COLONOSCOPY WITH PROPOFOL N/A 11/22/2015   Procedure: COLONOSCOPY WITH PROPOFOL;  Surgeon: Hulen Luster, MD;  Location: [facility] ENDOSCOPY;  Service: Gastroenterology;  Laterality: N/A;  . CORONARY ANGIOPLASTY WITH STENT PLACEMENT  2008    HEMATOLOGY/ONCOLOGY HISTORY:  Oncology History Overview Note  # AUG 2011- LEFT BREAST CA Stage II ER/PR- Pos; her 2 NEG; s/p neo-adj ACx4- poor response; s/p lumpec [Dr.Arru]- ypT2 [2.5cm]ypsN-0/2; Taxol weekly 9/12 [sec to PN]; finished march 2012. Arimidex-May 2012; Aug 2013- changed to Aug 2013; sep 2013- Started Tam; Mammo- in April 2017.   # Declines extended anti-hormone therapy; # Feb 2017- Bone scan-  Negative [hypercalcemia] ;   # AUG 2021- liver Biopsy- LIVER; BIOPSY:[[facility] II OPINION] - LIVER PARENCHYMA WITH ECTATIC PORTAL VEINS WITH HERNIATION AND FOCI SUGGESTIVE OF ORGANIZING THROMBI, PERIDUCTAL FIBROSIS WITH CHRONIC  LYMPHOPLASMACYTIC  INFILTRATE, HEMOSIDERIN DEPOSITION AND PERIPORTAL  REGENERATIVE HYPERPLASIA, SUGGESTIVE OF CHANGES SECONDARY TO PORTAL VEIN  THROMBOSIS (CLINICAL). - FEATURES OF OBSTRUCTIVE BILIARY CHANGES. - NO EVIDENCE OF MALIGNANCY IS SEEN.   # Hypercalcemia- chronic [2016]; primary hyperparathyrdoisim; improved after stopping HCTZ.    # PN- G-1  DIAGNOSIS: breast cancer  STAGE:  II       ;GOALS: cure  CURRENT/MOST RECENT THERAPY: surviellance    Breast CA ([place])  10/29/2014 Initial Diagnosis   Breast CA ([place])   Carcinoma of overlapping sites of left breast in female, estrogen receptor positive ([place])    ALLERGIES:  is allergic to tetanus toxoids and shellfish allergy.  MEDICATIONS:  Current Outpatient Medications  Medication Sig Dispense Refill  . aspirin EC 81 MG EC tablet Take 1 tablet (81 mg total) by mouth daily. Swallow whole. 30 tablet 0  . cholecalciferol (VITAMIN D) 1000 units tablet Take 1 tablet (1,000 Units total) by mouth daily.    . feeding supplement (ENSURE ENLIVE / ENSURE PLUS) LIQD Take 237 mLs by mouth 2 (two) times daily between meals. 237 mL 12  . ferrous sulfate 325 (65 FE) MG tablet Take 1 tablet (325 mg total) by mouth daily. 30 tablet 0  . Lactulose 20 GM/30ML SOLN Take 30 mLs (20 g total) by mouth daily as needed. 450 mL 0  . metoprolol succinate (TOPROL-XL) 25 MG 24 hr tablet Take 0.5 tablets (12.5 mg total) by mouth daily. 45 tablet 3  . ondansetron (ZOFRAN) 4 MG tablet Take 1 tablet (4 mg total) by mouth daily as needed for nausea or vomiting. 12 tablet 0  . oxyCODONE-acetaminophen (PERCOCET) 10-325 MG tablet Take 1 tablet by mouth every 6 (six) hours as needed for pain. 90 tablet 0  . PARoxetine (PAXIL) 10 MG tablet Take 1 tablet (10 mg total) by mouth daily. 30 tablet 0  . polyethylene glycol (MIRALAX / GLYCOLAX) 17 g packet Take 17 g by mouth 2 (two) times daily as needed. 14 each 0  . rosuvastatin (CRESTOR) 10 MG tablet Take 1 tablet (10 mg total) by mouth daily.  90 tablet 1   No current facility-administered medications for this visit.    VITAL SIGNS: There were no vitals taken for this visit. There were no vitals filed for this visit.  Estimated body mass index is 27.64 kg/m as calculated from the following:   Height as of 10/15/20: 5\' 4"  (1.626 m).   Weight as of an earlier encounter on 10/21/20: 161 lb (73 kg).  LABS: CBC:    Component Value Date/Time   WBC 6.7 10/17/2020 0616   HGB 9.0 (L) 10/17/2020 0616   HGB 13.2 03/02/2014 1034   HCT 28.5 (L) 10/17/2020 0616   HCT 39.7 03/02/2014 1034   PLT 33 (L) 10/17/2020 0616   PLT 269 03/02/2014 1034   MCV 82.8 10/17/2020 0616   MCV 94 03/02/2014 1034   NEUTROABS 13.1 (H) 10/14/2020 1448   NEUTROABS 4.3 03/02/2014 1034   LYMPHSABS 0.6 (L) 10/14/2020 1448   LYMPHSABS 2.4 03/02/2014 1034   MONOABS 0.2 10/14/2020 1448   MONOABS 0.5 03/02/2014 1034   EOSABS 0.2 10/14/2020 1448   EOSABS 0.2 03/02/2014 1034   BASOSABS 0.0 10/14/2020 1448   BASOSABS 0.1 03/02/2014 1034   Comprehensive Metabolic Panel:  Component Value Date/Time   NA 137 10/17/2020 0616   NA 142 05/29/2020 1344   K 4.8 10/17/2020 0616   CL 110 10/17/2020 0616   CO2 23 10/17/2020 0616   BUN 32 (H) 10/17/2020 0616   BUN 9 05/29/2020 1344   CREATININE 0.83 10/17/2020 0616   CREATININE 0.63 08/08/2020 0000   GLUCOSE 111 (H) 10/17/2020 0616   GLUCOSE 134 (H) 03/01/2013 1414   CALCIUM 9.0 10/17/2020 0616   AST 30 10/14/2020 1448   AST 13 (L) 03/02/2014 1034   ALT 20 10/14/2020 1448   ALT 21 03/02/2014 1034   ALKPHOS 92 10/14/2020 1448   ALKPHOS 76 03/02/2014 1034   BILITOT 0.8 10/14/2020 1448   BILITOT 0.4 05/29/2020 1344   BILITOT 0.2 03/02/2014 1034   PROT 6.1 (L) 10/14/2020 1448   PROT 6.6 05/29/2020 1344   PROT 7.5 03/02/2014 1034   ALBUMIN 2.7 (L) 10/14/2020 1448   ALBUMIN 3.9 05/29/2020 1344   ALBUMIN 3.5 03/02/2014 1034    RADIOGRAPHIC STUDIES: DG Chest 2 View  Result Date: 10/15/2020 CLINICAL  DATA:  Chest pain for 30 minutes, upper chest pain. EXAM: CHEST - 2 VIEW COMPARISON:  October 09, 2020 FINDINGS: Trachea midline. Low lung volumes. Cardiomediastinal contours are stable and accentuated by low depth of expansion. Calcified atheromatous plaque in the thoracic aorta. Basilar opacities. Increased basilar opacification on the RIGHT as compared to the previous study. Persistent linear opacity in the RIGHT mid chest. Effusion seen on lateral radiograph with blunting of RIGHT costodiaphragmatic sulcus. Likely increased slightly since the prior study. On limited assessment no acute skeletal process. IMPRESSION: Small RIGHT effusion slightly increased with increased basilar opacity at the RIGHT lung base may reflect developing infection or worsening volume loss in the setting of diminished lung volumes. Electronically Signed   By: Zetta Bills M.D.   On: 10/15/2020 12:44   DG Chest 2 View  Result Date: 10/04/2020 CLINICAL DATA:  Status post right chest wall mass biopsy EXAM: CHEST - 2 VIEW COMPARISON:  Chest radiograph March 26, 2010 FINDINGS: The heart size and mediastinal contours are within normal limits. Calcifications aortic arch. No visible pneumothorax. Right lower lobe airspace opacity. Small right pleural effusion. No visible pneumothorax. Thoracic spondylosis. IMPRESSION: Right lower lobe atelectasis with right pleural effusion. No visible pneumothorax. Electronically Signed   By: Dahlia Bailiff MD   On: 10/04/2020 12:36   CT Head Wo Contrast  Result Date: 10/03/2020 CLINICAL DATA:  Headache, intracranial hemorrhage suspected EXAM: CT HEAD WITHOUT CONTRAST TECHNIQUE: Contiguous axial images were obtained from the base of the skull through the vertex without intravenous contrast. COMPARISON:  10/01/2020 FINDINGS: Brain: No evidence of acute infarction, hemorrhage, hydrocephalus, extra-axial collection or mass lesion/mass effect. Mild periventricular and deep white matter hypodensity.  Vascular: No hyperdense vessel or unexpected calcification. Skull: Normal. Negative for fracture or focal lesion. Sinuses/Orbits: No acute finding. Other: None. IMPRESSION: 1. No acute intracranial pathology. No non-contrast CT findings to explain headache. Specifically, no evidence of intracranial hemorrhage. 2.  Small-vessel white matter disease. Electronically Signed   By: Eddie Candle M.D.   On: 10/03/2020 12:06   CT Head Wo Contrast  Result Date: 10/01/2020 CLINICAL DATA:  Altered mental status, unsteady gait EXAM: CT HEAD WITHOUT CONTRAST TECHNIQUE: Contiguous axial images were obtained from the base of the skull through the vertex without intravenous contrast. COMPARISON:  None. FINDINGS: Brain: No evidence of acute infarction, hemorrhage, hydrocephalus, extra-axial collection or mass lesion/mass effect. Scattered low-density changes within the  periventricular and subcortical white matter compatible with chronic microvascular ischemic change. Mild diffuse cerebral volume loss. Vascular: Atherosclerotic calcifications involving the large vessels of the skull base. No unexpected hyperdense vessel. Skull: Normal. Negative for fracture or focal lesion. Sinuses/Orbits: No acute finding. Other: None. IMPRESSION: 1. No acute intracranial findings. 2. Mild chronic microvascular ischemic change and cerebral volume loss. Electronically Signed   By: Davina Poke D.O.   On: 10/01/2020 16:28   CT Head W or Wo Contrast  Result Date: 10/09/2020 CLINICAL DATA:  Mental status change EXAM: CT HEAD WITHOUT AND WITH CONTRAST TECHNIQUE: Contiguous axial images were obtained from the base of the skull through the vertex without and with intravenous contrast CONTRAST:  12mL OMNIPAQUE IOHEXOL 300 MG/ML  SOLN COMPARISON:  10/03/2020 FINDINGS: Brain: There is no acute intracranial hemorrhage, mass, mass effect, or edema. No abnormal enhancement. Gray-white differentiation is preserved. There is no extra-axial fluid  collection. Ventricles and sulci are stable in size and configuration. Patchy hypoattenuation in the supratentorial white matter is nonspecific but likely reflects stable mild chronic microvascular ischemic changes. Vascular: There is atherosclerotic calcification at the skull base. Skull: Calvarium is unremarkable. Sinuses/Orbits: No acute finding. Other: None. IMPRESSION: No acute intracranial abnormality.  No abnormal enhancement. Electronically Signed   By: Macy Mis M.D.   On: 10/09/2020 09:55   CT ABDOMEN PELVIS W CONTRAST  Result Date: 10/09/2020 CLINICAL DATA:  Confusion, abdominal bloating, pain EXAM: CT ABDOMEN AND PELVIS WITH CONTRAST TECHNIQUE: Multidetector CT imaging of the abdomen and pelvis was performed using the standard protocol following bolus administration of intravenous contrast. CONTRAST:  152mL OMNIPAQUE IOHEXOL 300 MG/ML  SOLN COMPARISON:  10/06/2020 FINDINGS: Lower chest: Moderate right pleural effusion. Compressive atelectasis in the right lower lobe. Stable mass in a right lower costochondral junction is unchanged. Postsurgical changes in the left breast. This is unchanged. Hepatobiliary: No change in the multiple masses throughout the liver presumably metastases. Dilated and thrombosed portal vein again noted with cavernous transformation, stable. Pancreas: Stable dilated pancreatic duct. Stable pancreatic body and pancreatic tail masses. Spleen: No focal abnormality.  Normal size. Adrenals/Urinary Tract: Stable low-density lesion off the upper pole of the left kidney measuring 1.8 cm. No hydronephrosis. Adrenal glands and urinary bladder unremarkable. Stomach/Bowel: Moderate stool throughout the colon. No evidence of bowel obstruction. Vascular/Lymphatic: Aortic atherosclerosis. Upper abdominal adenopathy again noted, the largest a portacaval node measuring up to 3.9 cm. Retroperitoneal adenopathy, stable. Reproductive: Prior hysterectomy Other: Large volume ascites in the  abdomen and pelvis. Nodularity throughout the mesentery and omentum, most pronounced in the right abdomen and upper abdomen most likely reflects malignant ascites and peritoneal spread of disease. This may also be reflected within umbilical nodule, stable. Musculoskeletal: No acute bony abnormality or focal lesion. IMPRESSION: Extensive metastatic disease throughout the abdomen as described above. Findings stable since prior study. Chronic portal vein thrombosis with cavernous transformation, stable. Upper abdominal and retroperitoneal adenopathy, stable. Large volume ascites, likely malignant ascites, stable. No evidence of bowel obstruction. Stable moderate right pleural effusion with right lower lobe atelectasis. Electronically Signed   By: Rolm Baptise M.D.   On: 10/09/2020 10:15   CT ABDOMEN PELVIS W CONTRAST  Result Date: 10/06/2020 CLINICAL DATA:  Abdominal swelling, vomiting, history of left breast cancer, metastatic disease on previous CT EXAM: CT ABDOMEN AND PELVIS WITH CONTRAST TECHNIQUE: Multidetector CT imaging of the abdomen and pelvis was performed using the standard protocol following bolus administration of intravenous contrast. CONTRAST:  130mL OMNIPAQUE IOHEXOL 300 MG/ML  SOLN COMPARISON:  10/03/2020 FINDINGS: Lower chest: Stable right pleural effusion and right lower lobe atelectasis. The soft tissue mass at the right anterior sixth costochondral junction is unchanged, please correlate with recent biopsy results. Chronic postsurgical changes left breast. Hepatobiliary: No change in the multiple confluent hypodense masses within the liver consistent with presumed metastases. Dilated thrombosed portal vein again noted. Pancreas: There is marked pancreatic parenchymal atrophy. There are multiple pancreatic masses identified. In the pancreatic tail hypodense mass measures approximately 2.4 x 1.8 cm image 24/2. Hypodense mass in the dorsal aspect of the pancreatic body image 28/2 measures 2.2 x  1.9 cm, with hyperdense margins. Pancreatic duct dilation unchanged. Spleen: Spleen demonstrates normal size without focal abnormality. Splenic vein is patent. Adrenals/Urinary Tract: Indeterminate 1.8 cm hypodensity upper pole left kidney with Hounsfield attenuation of 43, stable. No other focal renal abnormalities. No renal tract calculi or obstructive uropathy. The bladder is decompressed with no focal abnormalities. The adrenals are normal. Stomach/Bowel: There is moderate gas and stool throughout the colon which may reflect an element of constipation. No evidence of bowel obstruction or ileus. Vascular/Lymphatic: Extensive atherosclerosis of the aorta is again noted unchanged. Chronic portal vein thrombosis with cavernous transformation again seen. The splenic vein and superior mesenteric vein are patent. Pathologic adenopathy is seen throughout the upper abdomen. 10 mm lymph node in the porta hepatis image 25/2 is stable. Multiple enlarged lymph nodes are seen within the gastrohepatic ligament, largest measuring 11 mm in short axis image 23/2. Necrotic lymph node dorsal to the pancreatic body image 28/2 measures 19 mm in short axis. Aortocaval lymph node image 36/2 measures 10 mm in short axis. Reproductive: Status post hysterectomy. No adnexal masses. Other: Moderate abdominal and pelvic ascites, without appreciable change since prior study. There is some soft tissue nodularity within the right upper quadrant mesentery, just inferior to the liver and along the hepatic flexure of the colon, concerning for peritoneal implant. No free intraperitoneal gas. Stable umbilical hernia containing mesenteric fat. Inflammatory changes within the hernia may suggest incarceration. Musculoskeletal: No acute or destructive bony lesions. Reconstructed images demonstrate no additional findings. IMPRESSION: 1. Extensive intra-abdominal and intrapelvic metastases, without significant change since prior study. Given the above  findings, this may reflect primary pancreatic adenocarcinoma with diffuse metastases. Correlation with recent biopsy results recommended. No significant change in the appearance of the metastatic disease since prior study. 2. Moderate ascites, without significant change since prior study. This is likely malignant ascites given the soft tissue nodularity within the right upper quadrant mesentery. 3. Chronic portal vein thrombosis with cavernous transformation. 4. No evidence of bowel obstruction or ileus. Retained gas and stool throughout the colon may reflect constipation. 5. Stable right pleural effusion. 6.  Aortic Atherosclerosis (ICD10-I70.0). Electronically Signed   By: Randa Ngo M.D.   On: 10/06/2020 19:51   CT ABDOMEN PELVIS W CONTRAST  Addendum Date: 10/03/2020   ADDENDUM REPORT: 10/03/2020 14:10 ADDENDUM: ,There is soft tissue fullness along the anterior right lower rib cartilage, including on 14/2. This is new since 02/19/2020, also highly suspicious for metastatic disease. Electronically Signed   By: Abigail Miyamoto M.D.   On: 10/03/2020 14:10   Result Date: 10/03/2020 CLINICAL DATA:  Right-sided abdominal pain for about a week with nausea and vomiting x2. Constipation. Remote breast cancer history. EXAM: CT ABDOMEN AND PELVIS WITH CONTRAST TECHNIQUE: Multidetector CT imaging of the abdomen and pelvis was performed using the standard protocol following bolus administration of intravenous contrast. CONTRAST:  14mL OMNIPAQUE IOHEXOL  300 MG/ML  SOLN COMPARISON:  Multiple priors, including MRCP of 09/26/2020, abdominal CT of 02/19/2020, abdominal MRI 03/07/2020. FINDINGS: Lower chest: Right base atelectasis. Small right pleural effusion is new since the prior CT. Normal heart size with multivessel coronary artery atherosclerosis. Left-sided calcification or surgical clips. Deep to the presumed lumpectomy site is a centrally hypoattenuating lesion of 2.8 x 2.2 cm on 05/02. This is likely not imaged on  the prior CT, but present on the 03/07/2020 MRI where it was grossly similar. Hepatobiliary: Multiple hepatic masses are again identified. A high right hepatic lobe lesion measures 2.6 cm on 13/2 versus 1.8 cm on 02/19/2020. A wedge-shaped mass within segments 4 and 2 of the liver measures on the order of 10.5 x 5.1 cm on 19/2. Significantly enlarged from 02/19/2020, where lesions in this region measured up to 3.0 cm. Increased from on the order of 3.3 cm on 03/07/2020. Cholecystectomy without biliary duct dilatation. Pancreas: The pancreas demonstrates similar atrophy with moderate duct dilatation at 9 mm on 29/2. Duct dilatation is followed to the level of the pancreatic head, with there is mild soft tissue fullness but no well-defined mass. Example 34/2. This is unchanged. The pancreatic tail demonstrates developing relative soft tissue fullness compared to the pancreatic body. Example at on the order of 2.3 cm in thickness on 25/2 versus 1.4 cm on 02/19/2020. Spleen: Normal in size, without focal abnormality. Adrenals/Urinary Tract: Normal adrenal glands. Upper pole left renal lesion of 1.6 cm is greater than fluid density but was determined a complex cyst on prior MRI. Normal right kidney. Contrast in the urinary bladder secondary to reported outside CT performed yesterday. Stomach/Bowel: Tiny hiatal hernia. Normal distal stomach. Moderate amount of stool within the rectum. Scattered colonic diverticula. Normal terminal ileum. Normal small bowel caliber. Vascular/Lymphatic: Aortic atherosclerosis. Diffuse portal vein thrombosis again identified. Thrombus involves the superior aspect of the superior mesenteric vein, chronic. New and progressive abdominal adenopathy. An aortocaval node measures 1.0 cm on 39/2 versus 7 mm on 02/19/2020 (when remeasured). A necrotic node along the celiac, just posterior to the pancreatic body measures 1.9 cm on 30/2 and is new. Developing adenopathy in the gastrohepatic ligament  including at 1.0 cm on 24/2. More ill-defined hypoattenuating lesions in the peripancreatic space, including at 2.6 cm in the portacaval space on 29/2, increased from 2.3 cm on 02/19/2020. No pelvic sidewall adenopathy. Reproductive: Hysterectomy.  No adnexal mass. Other: Since 02/19/2020, development of small volume abdominopelvic ascites. Peritoneal nodularity, including within the pelvic cul-de-sac on 78/2 and along the right-side of the abdomen on 39/2. No free intraperitoneal air. A new periumbilical nodule or node measures 1.9 cm on 57/2. Musculoskeletal: No acute osseous abnormality. Trace L4-5 anterolisthesis. Degenerate disc disease at the lumbosacral junction. IMPRESSION: 1. When compared to multiple prior exams, including most direct comparison to the 02/19/2020 CT, new or progressive metastatic disease, most definite involving necrotic abdominal nodes and the peritoneum as detailed above. Developing periumbilical nodularity, also likely metastatic. 2. Progressive liver lesions, also most likely indicative of metastatic disease. In the setting of portal vein thrombosis, progressive abscesses are possible but less likely. 3. Abnormal appearance of the pancreas, at least partially felt to be attributed to chronic pancreatitis. Developing soft tissue fullness within the pancreatic tail, with considerations of primary adenocarcinoma, metastatic disease, or superimposed acute pancreatitis. Peripancreatic hypoattenuating lesions, some of which are increased compared to 02/19/2020. These could be secondary to prior pancreatitis or also represent necrotic nodal metastasis. 4. Left breast mass in the  setting of prior lumpectomy for carcinoma. Suspicious for new breast primary, incompletely imaged. 5.  Possible constipation. 6. New small volume abdominopelvic ascites. 7. Similar small right pleural effusion compared to the MRI of 09/26/2020. Electronically Signed: By: Abigail Miyamoto M.D. On: 10/03/2020 12:32   MR  ABDOMEN MRCP WO CONTRAST  Result Date: 09/27/2020 CLINICAL DATA:  Pancreatitis. Pancreatic and liver lesions on prior MRI. Abdominal pain. EXAM: MRI ABDOMEN WITHOUT CONTRAST  (INCLUDING MRCP) TECHNIQUE: Multiplanar multisequence MR imaging of the abdomen was performed. Heavily T2-weighted images of the biliary and pancreatic ducts were obtained, and three-dimensional MRCP images were rendered by post processing. COMPARISON:  03/07/2020 MRI FINDINGS: Despite efforts by the technologist and patient, severe motion artifact is present on today's exam and could not be eliminated. This reduces exam sensitivity and specificity. Due to pain, the patient terminated the exam prior to obtaining full diagnostic sequences. Lower chest: Small right pleural effusion is new compared to prior MRI. Hepatobiliary: There is been evolution of the previous regions of stippled T2 signal hyperintensity scattered in the liver to current appearance of hazy accentuated T2 signal for example on image 9 of series 4. On biopsy, these were felt to represent areas of periductal fibrosis, chronic lymphoplasmacytic infiltrate, hemosiderin deposition, and regenerative hyperplasia suggesting finding secondary to portal vein thrombosis. This process is significantly expanded throughout segment 4a and 4B of the liver which not demonstrates diffuse hazy accentuated T2 signal as shown on image 14 series 4. There is associated reduced T1 signal in these regions of involvement. No steatosis. There is some associated restriction of diffusion in these regions of involvement. Unfortunately the MRCP images are severely blurred. No obvious extrahepatic biliary dilatation. There is some mild narrowing of the common hepatic duct along the porta hepatis for example on image 16 series 3, probably due to surrounding soft tissue swelling and possibly periportal fibrosis. As before, there is high T2 signal intensity material throughout the somewhat dilated  intrahepatic portal venous system probably from thrombosis. Periportal edema noted. Pancreas: Peripancreatic edema noted with stippled accentuated T2 signal in the pancreas, and substantially dilated segments of the dorsal pancreatic duct especially in the pancreatic body, up to 1.0 cm in diameter. T2 signal hyperintensity partially involving the pancreatic head measuring about 4.5 by 1.6 by 2.6 cm, extending cephalad along the porta hepatis, I am uncertain whether this represents a thrombosed varix from the portal vein, pseudocyst, or a cystic pancreatic neoplasm. This appears roughly similar to the prior exam. Spleen:  Unremarkable Adrenals/Urinary Tract: Small renal lesions of varying complexity are present and probably a combination of simple and complex cysts, although enhancement characteristics are not interrogated today. The adrenal glands appear normal. Stomach/Bowel: Unremarkable Vascular/Lymphatic: Aortoiliac atherosclerotic vascular disease. 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 Collaterals along the porta hepatis. Other: Ascites particularly along the paracolic gutters and in the pelvis (pelvic ascites seen on coronal images). Mesenteric edema mildly improved in the central mesentery compared to previous. Omental edema noted. There is hazy stranding in the root of the mesentery as on prior exams. Musculoskeletal: Lower lumbar spondylosis and degenerative disc disease. IMPRESSION: 1. Despite efforts by the technologist and patient, severe motion artifact is present on today's exam and could not be eliminated. The patient was also unable to complete the full sequence of the exam. This reduces exam sensitivity and specificity. 2. Evolution of the appearance in the liver with the previously  stippled T2 signal hyperintensities now appearing as hazy increased T2 signal, and with substantially  increased involvement throughout segment 4 and 4B of the liver. Based on prior biopsy results this likely represents combination of fibrosis, lymphoplasmacytic infiltrate, hemosiderin deposition, and periportal hyperplasia resulting from portal vein thrombosis. 3. 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 No obvious extrahepatic biliary dilatation. There is some mild narrowing of the common hepatic duct along the porta hepatis, probably due to surrounding soft tissue swelling and possibly periportal fibrosis. 4. Peripancreatic edema with stippled accentuated T2 signal in the pancreas, and substantially dilated segments of the dorsal pancreatic duct especially in the pancreatic body. 5. Lobulated T2 signal hyperintensity along the pancreatic head, possibly a pseudocyst or a thrombosed varix of the portal vein, less likely cystic pancreatic neoplasm. 6. Small right pleural effusion is new compared to previous MRI. 7. Reduced central mesenteric edema compared to previous, although there is still some peripancreatic edema as well as mesenteric edema and ascites. 8. Lower lumbar spondylosis and degenerative disc disease. Electronically Signed   By: Van Clines M.D.   On: 09/27/2020 16:11   US Paracentesis  Result Date: 10/09/2020 INDICATION: 74 year old female with history of breast cancer and abdominal pain found to have large volume ascites. EXAM: ULTRASOUND GUIDED left lower quadrant PARACENTESIS MEDICATIONS: None. COMPLICATIONS: None immediate. PROCEDURE: Informed written consent was obtained from the patient after a discussion of the risks, benefits and alternatives to treatment. A timeout was performed prior to the initiation of the procedure. Initial ultrasound scanning demonstrates a large amount of ascites within the left lower abdominal quadrant. The right lower abdomen was prepped and  draped in the usual sterile fashion. 1% lidocaine was used for local anesthesia. Following this, a 6 Fr Safe-T-Centesis catheter was introduced. An ultrasound image was saved for documentation purposes. The paracentesis was performed. The catheter was removed and a dressing was applied. The patient tolerated the procedure well without immediate post procedural complication. FINDINGS: A total of approximately 3.05 L of translucent, straw-colored fluid was removed. Samples were sent to the laboratory as requested by the clinical team. IMPRESSION: Successful ultrasound-guided paracentesis yielding 3.05 liters of peritoneal fluid. Ruthann Cancer, MD [facility] Electronically Signed   By: Ruthann Cancer MD   On: 10/09/2020 15:53   DG Chest Portable 1 View  Result Date: 10/09/2020 CLINICAL DATA:  Altered mental status.  Rule out pneumonia EXAM: PORTABLE CHEST 1 VIEW COMPARISON:  10/04/2020 FINDINGS: Progression of right lower lobe airspace disease.  No effusion Left lung remains clear.  Negative for heart failure. IMPRESSION: Progression of right lower lobe airspace disease which may represent atelectasis or pneumonia. Electronically Signed   By: Franchot Gallo M.D.   On: 10/09/2020 10:11   ECHOCARDIOGRAM COMPLETE  Result Date: 10/16/2020    ECHOCARDIOGRAM REPORT   Patient Name:   RACHEL RISON Date of Exam: 10/16/2020 Medical Rec #:  [id number]      Height:       64.0 in Accession #:    [id number]     Weight:       157.6 lb Date of Birth:  Mar 08, 1946       BSA:          1.768 m Patient Age:    65 years       BP:  136/69 mmHg Patient Gender: F              HR:           88 bpm. Exam Location:  [facility] Procedure: 2D Echo, Color Doppler, Cardiac Doppler and Intracardiac            Opacification Agent Indications:     I21.4 NSTEMI; R07.9 Chest Pain  History:         Patient has no prior history of Echocardiogram examinations.                  Previous  Myocardial Infarction and CAD; Risk                  Factors:Hypertension and Dyslipidemia. Hx of chemotherapy.  Sonographer:     Charmayne Sheer RDCS (AE) Referring Phys:  [id number] Collier Bullock Diagnosing Phys: Bartholome Bill MD  Sonographer Comments: Suboptimal apical window and suboptimal subcostal window. IMPRESSIONS  1. Left ventricular ejection fraction, by estimation, is 65 to 70%. The left ventricle has normal function. The left ventricle has no regional wall motion abnormalities. Left ventricular diastolic parameters are consistent with Grade I diastolic dysfunction (impaired relaxation).  2. Right ventricular systolic function is normal. The right ventricular size is normal.  3. The mitral valve is grossly normal. Mild mitral valve regurgitation.  4. The aortic valve is tricuspid. Aortic valve regurgitation is trivial. FINDINGS  Left Ventricle: Left ventricular ejection fraction, by estimation, is 65 to 70%. The left ventricle has normal function. The left ventricle has no regional wall motion abnormalities. Definity contrast agent was given IV to delineate the left ventricular  endocardial borders. The left ventricular internal cavity size was normal in size. There is no left ventricular hypertrophy. Left ventricular diastolic parameters are consistent with Grade I diastolic dysfunction (impaired relaxation). Right Ventricle: The right ventricular size is normal. No increase in right ventricular wall thickness. Right ventricular systolic function is normal. Left Atrium: Left atrial size was normal in size. Right Atrium: Right atrial size was normal in size. Pericardium: There is no evidence of pericardial effusion. Mitral Valve: The mitral valve is grossly normal. Mild mitral valve regurgitation. MV peak gradient, 3.6 mmHg. The mean mitral valve gradient is 1.0 mmHg. Tricuspid Valve: The tricuspid valve is grossly normal. Tricuspid valve regurgitation is mild. Aortic Valve: The aortic valve is tricuspid. Aortic  valve regurgitation is trivial. Aortic valve mean gradient measures 4.0 mmHg. Aortic valve peak gradient measures 7.4 mmHg. Aortic valve area, by VTI measures 1.76 cm. Pulmonic Valve: The pulmonic valve was not well visualized. Pulmonic valve regurgitation is trivial. Aorta: The aortic root is normal in size and structure. IAS/Shunts: The interatrial septum was not assessed.  LEFT VENTRICLE PLAX 2D LVIDd:         3.40 cm  Diastology LVIDs:         2.10 cm  LV e' medial:    7.07 cm/s LV PW:         0.90 cm  LV E/e' medial:  8.0 LV IVS:        0.70 cm  LV e' lateral:   8.70 cm/s LVOT diam:     1.60 cm  LV E/e' lateral: 6.5 LV SV:         38 LV SV Index:   22 LVOT Area:     2.01 cm  RIGHT VENTRICLE RV Basal diam:  2.60 cm LEFT ATRIUM  Index LA diam:        3.00 cm 1.70 cm/m LA Vol (A2C):   29.6 ml 16.74 ml/m LA Vol (A4C):   27.0 ml 15.27 ml/m LA Biplane Vol: 28.2 ml 15.95 ml/m  AORTIC VALVE                   PULMONIC VALVE AV Area (Vmax):    1.55 cm    PV Vmax:       1.22 m/s AV Area (Vmean):   1.57 cm    PV Vmean:      86.600 cm/s AV Area (VTI):     1.76 cm    PV VTI:        0.203 m AV Vmax:           136.00 cm/s PV Peak grad:  6.0 mmHg AV Vmean:          95.500 cm/s PV Mean grad:  3.0 mmHg AV VTI:            0.217 m AV Peak Grad:      7.4 mmHg AV Mean Grad:      4.0 mmHg LVOT Vmax:         105.00 cm/s LVOT Vmean:        74.400 cm/s LVOT VTI:          0.190 m LVOT/AV VTI ratio: 0.88  AORTA Ao Root diam: 2.80 cm MITRAL VALVE MV Area (PHT): 2.77 cm    SHUNTS MV Area VTI:   2.03 cm    Systemic VTI:  0.19 m MV Peak grad:  3.6 mmHg    Systemic Diam: 1.60 cm MV Mean grad:  1.0 mmHg MV Vmax:       0.95 m/s MV Vmean:      49.6 cm/s MV Decel Time: 274 msec MV E velocity: 56.60 cm/s MV A velocity: 81.40 cm/s MV E/A ratio:  0.70 Bartholome Bill MD Electronically signed by Bartholome Bill MD Signature Date/Time: 10/16/2020/10:09:22 AM    Final    [place] CORE BIOPSY (SOFT TISSUE)  Result Date: 10/04/2020 INDICATION:  75 year old woman with history of breast cancer presented to interventional radiology for biopsy of new right chest wall mass. EXAM: Ultrasound-guided biopsy of right chest wall mass MEDICATIONS: None. ANESTHESIA/SEDATION: Moderate (conscious) sedation was employed during this procedure. A total of Versed 2 mg and Fentanyl 100 mcg was administered intravenously. Moderate Sedation Time: 8 minutes. The patient's level of consciousness and vital signs were monitored continuously by radiology nursing throughout the procedure under my direct supervision. COMPLICATIONS: None immediate. PROCEDURE: Informed written consent was obtained from the patient after a thorough discussion of the procedural risks, benefits and alternatives. All questions were addressed. Maximal Sterile Barrier Technique was utilized including caps, mask, sterile gowns, sterile gloves, sterile drape, hand hygiene and skin antiseptic. A timeout was performed prior to the initiation of the procedure. Patient position supine on the ultrasound table. Right anterior lower chest wall skin prepped and draped in usual sterile fashion. Following local lidocaine administration, 18 gauge biopsy needle was advanced into the right anterior chest wall mass, and four cores were obtained utilizing continuous ultrasound guidance. Samples were sent to pathology in formalin. Needle removed and hemostasis achieved with 5 minutes of manual compression. Post procedure ultrasound images showed no evidence of significant hemorrhage. IMPRESSION: Ultrasound-guided biopsy of right anterior chest wall mass. Electronically Signed   By: Miachel Roux M.D.   On: 10/04/2020 17:04   [place] EKG SITE RITE  Result Date: 10/15/2020 If [facility] image not attached, placement could not be confirmed due to current cardiac rhythm.   PERFORMANCE STATUS (ECOG) : 2 - Symptomatic, <50% confined to bed  Review of Systems Unless otherwise noted, a complete review of systems is  negative.  Physical Exam General: NAD Pulmonary: Unlabored Extremities: no edema, no joint deformities Skin: no rashes Neurological: Weakness but otherwise nonfocal  IMPRESSION: Patient was an acute add-on to my clinic schedule today at Dr. Aletha Halim request.  Patient is doing poorly.  She has limited oral intake and is acutely dehydrated.  She appears to have poor tolerance for consideration of future systemic chemotherapy.  Hospice was discussed at length with patient and family.  Patient ultimately stated that her goal was comfort at home and she was in agreement with hospice involvement to achieve that goal.  Patient does endorse anxiety.  She has taken alprazolam and lorazepam in the past with good effect.  Will order lorazepam as needed.  Patient is having abdominal pain.  Oxycodone is helping but patient's family are giving her a tablet every 6 hours.  We will liberalize frequency of oxycodone every 4 hours as needed.  Could consider starting a long-acting opioid if needed.  Discussed importance of maintaining a daily bowel regimen to prevent opioid-induced constipation.  Patient also has some abdominal distention.  Will pursue repeat ultrasound-guided paracentesis for therapeutic management.  I called in order for hospice to [facility].   PLAN: -Continue current scope of treatment -Therapeutic paracentesis -Increase Percocet every 4 hours as needed -Daily bowel regimen -Lorazepam 0.5 mg every 8 hours as needed for anxiety -Referral to hospice -DNR/DNI -RTC as needed  Case and plan discussed with Dr. Rogue Bussing   Patient expressed understanding and was in agreement with this plan. She also understands that She can call the clinic at any time with any questions, concerns, or complaints.     Time Total: 25 minutes  Visit consisted of counseling and education dealing with the complex and emotionally intense issues of symptom management and palliative care in the  setting of serious and potentially life-threatening illness.Greater than 50%  of this time was spent counseling and coordinating care related to the above assessment and plan.  Signed by: Altha Harm, PhD, NP-C

## 2020-10-21 NOTE — Progress Notes (Signed)
[place] OFFICE PROGRESS NOTE  Patient Care Team: Steele Sizer, MD as PCP - General (Family Medicine) Cammie Sickle, MD as Consulting Physician (Oncology) Corey Skains, MD as Consulting Physician (Cardiology) Gabriel Carina Betsey Holiday, MD as Consulting Physician (Endocrinology) Baxter Kail, MD as Consulting Physician (Dermatology) Anell Barr, OD as Consulting Physician (Optometry) Pieter Partridge, MD as Referring Physician (Dermatology) Vern Claude, LCSW as Social Worker Neldon Labella, RN as Registered Nurse Germaine Pomfret, [facility] (Pharmacist)  Cancer Staging No matching staging information was found for the patient.   Oncology History Overview Note  # AUG 2011- LEFT BREAST CA Stage II ER/PR- Pos; her 2 NEG; s/p neo-adj ACx4- poor response; s/p lumpec [Dr.Arru]- ypT2 [2.5cm]ypsN-0/2; Taxol weekly 9/12 [sec to PN]; finished march 2012. Arimidex-May 2012; Aug 2013- changed to Aug 2013; sep 2013- Started Tam; Mammo- in April 2017.   # Declines extended anti-hormone therapy; # Feb 2017- Bone scan- Negative [hypercalcemia] ;   # AUG 2021- liver Biopsy- LIVER; BIOPSY:[[facility] II OPINION] - LIVER PARENCHYMA WITH ECTATIC PORTAL VEINS WITH HERNIATION AND FOCI SUGGESTIVE OF ORGANIZING THROMBI, PERIDUCTAL FIBROSIS WITH CHRONIC  LYMPHOPLASMACYTIC INFILTRATE, HEMOSIDERIN DEPOSITION AND PERIPORTAL  REGENERATIVE HYPERPLASIA, SUGGESTIVE OF CHANGES SECONDARY TO PORTAL VEIN  THROMBOSIS (CLINICAL). - FEATURES OF OBSTRUCTIVE BILIARY CHANGES. - NO EVIDENCE OF MALIGNANCY IS SEEN.   # Hypercalcemia- chronic [2016]; primary hyperparathyrdoisim; improved after stopping HCTZ.    # PN- G-1  DIAGNOSIS: breast cancer  STAGE:  II       ;GOALS: cure  CURRENT/MOST RECENT THERAPY: surviellance    Breast CA ([place])  10/29/2014 Initial Diagnosis   Breast CA (HCC)   Carcinoma of overlapping sites of left breast in female, estrogen receptor positive ([place])      INTERVAL  HISTORY:  [name] 75 y.o.  female pleasant patient above carcinoma-of unknown primary [pancreaticobiliary versus breast-remote history of breast cancer]; portal vein thrombosis on lovenox is here for follow-up.  Patient was recently admitted to hospital for chest pain-noted to have elevated troponins with nonspecific EKG changes.  Evaluated by cardiology; 2D echo showed no evidence of MI.  Patient was discharged on aspirin; Lovenox was held because of low platelets of 33.  Patient noted to have progressive generalized weakness.  Also noted to have abdominal distention.  Appetite is fair.  However feels tired.  Patient has been taking oxycodone on a regular basis.   In the clinic it was difficult blood draw.  She is in a wheelchair.  Review of Systems  Constitutional: Positive for malaise/fatigue and weight loss. Negative for chills, diaphoresis and fever.  HENT: Negative for nosebleeds and sore throat.   Eyes: Negative for double vision.  Respiratory: Negative for cough, hemoptysis, sputum production, shortness of breath and wheezing.   Cardiovascular: Negative for chest pain, palpitations, orthopnea and leg swelling.  Gastrointestinal: Positive for abdominal pain and nausea. Negative for blood in stool, constipation, diarrhea, heartburn, melena and vomiting.  Genitourinary: Negative for dysuria, frequency and urgency.  Musculoskeletal: Negative for back pain and joint pain.  Skin: Negative.  Negative for itching and rash.  Neurological: Positive for dizziness and weakness. Negative for tingling, focal weakness and headaches.  Endo/Heme/Allergies: Does not bruise/bleed easily.  Psychiatric/Behavioral: Negative for depression. The patient is not nervous/anxious and does not have insomnia.       PAST MEDICAL HISTORY :  Past Medical History:  Diagnosis Date  . Adenocarcinoma of unknown primary ([place])   . Anxiety   . Breast cancer (  [place])    pT2 pN0 cM0 (stage IIA) invasive mammary  carcinoma of left outer breast status post lumpectomy and sentinel node study on July 14, 2010  . CAD (coronary artery disease)   . Depression   . H/O hypokalemia   . History of chemotherapy   . History of MI (myocardial infarction)   . Hyperglycemia   . Hyperlipidemia   . Hypertension   . Peripheral neuropathy due to chemotherapy ([place])   . Personal history of chemotherapy 2011   left breast ca  . Personal history of radiation therapy 2001   left breast ca    PAST SURGICAL HISTORY :   Past Surgical History:  Procedure Laterality Date  . ABDOMINAL HYSTERECTOMY    . BREAST BIOPSY Left 2008   neg  . BREAST BIOPSY Left 03/05/2010   invasive mammary carcinoma  . BREAST LUMPECTOMY Left 07/14/2010   INVASIVE MAMMARY CARCINOMA WITH PROMINENT INFILTRATIVE PATTERN, negative margins  . CHOLECYSTECTOMY    . COLONOSCOPY WITH PROPOFOL N/A 11/22/2015   Procedure: COLONOSCOPY WITH PROPOFOL;  Surgeon: Hulen Luster, MD;  Location: [facility] ENDOSCOPY;  Service: Gastroenterology;  Laterality: N/A;  . CORONARY ANGIOPLASTY WITH STENT PLACEMENT  2008    FAMILY HISTORY :   Family History  Problem Relation Age of Onset  . Breast cancer Sister 58  . Hypertension Mother   . Aneurysm Mother   . Heart attack Sister   . Birth defects Sister     SOCIAL HISTORY:   Social History   Tobacco Use  . Smoking status: Former Smoker    Packs/day: 0.25    Years: 25.00    Pack years: 6.25    Types: Cigarettes    Quit date: 2008    Years since quitting: 14.2  . Smokeless tobacco: Never Used  . Tobacco comment: smoking cessation materials not required  Vaping Use  . Vaping Use: Never used  Substance Use Topics  . Alcohol use: No    Comment: rare; holidays  . Drug use: No    ALLERGIES:  is allergic to tetanus toxoids and shellfish allergy.  MEDICATIONS:  Current Outpatient Medications  Medication Sig Dispense Refill  . aspirin EC 81 MG EC tablet Take 1 tablet (81 mg total) by mouth daily. Swallow  whole. 30 tablet 0  . cholecalciferol (VITAMIN D) 1000 units tablet Take 1 tablet (1,000 Units total) by mouth daily.    . feeding supplement (ENSURE ENLIVE / ENSURE PLUS) LIQD Take 237 mLs by mouth 2 (two) times daily between meals. 237 mL 12  . ferrous sulfate 325 (65 FE) MG tablet Take 1 tablet (325 mg total) by mouth daily. 30 tablet 0  . Lactulose 20 GM/30ML SOLN Take 30 mLs (20 g total) by mouth daily as needed. 450 mL 0  . LORazepam (ATIVAN) 0.5 MG tablet Take 1 tablet (0.5 mg total) by mouth every 8 (eight) hours as needed for anxiety. 30 tablet 0  . metoprolol succinate (TOPROL-XL) 25 MG 24 hr tablet Take 0.5 tablets (12.5 mg total) by mouth daily. 45 tablet 3  . ondansetron (ZOFRAN) 4 MG tablet Take 1 tablet (4 mg total) by mouth daily as needed for nausea or vomiting. 12 tablet 0  . oxyCODONE-acetaminophen (PERCOCET) 10-325 MG tablet Take 1 tablet by mouth every 4 (four) hours as needed for pain. 90 tablet 0  . PARoxetine (PAXIL) 10 MG tablet Take 1 tablet (10 mg total) by mouth daily. 30 tablet 0  . polyethylene glycol (MIRALAX / GLYCOLAX)  17 g packet Take 17 g by mouth 2 (two) times daily as needed. 14 each 0  . rosuvastatin (CRESTOR) 10 MG tablet Take 1 tablet (10 mg total) by mouth daily. 90 tablet 1   No current facility-administered medications for this visit.    PHYSICAL EXAMINATION: ECOG PERFORMANCE STATUS: 0 - Asymptomatic  BP (!) 89/69   Pulse (!) 108   Temp 97.6 F (36.4 C)   Resp 20   Wt 161 lb (73 kg)   BMI 27.64 kg/m   Filed Weights   10/21/20 1007  Weight: 161 lb (73 kg)   Physical Exam Constitutional:      Comments: Patient appears weak.;  In a wheelchair.  Accompanied by daughter.  HENT:     Head: Normocephalic and atraumatic.     Mouth/Throat:     Pharynx: No oropharyngeal exudate.  Eyes:     Pupils: Pupils are equal, round, and reactive to light.  Cardiovascular:     Rate and Rhythm: Normal rate and regular rhythm.  Pulmonary:     Effort:  Pulmonary effort is normal. No respiratory distress.     Breath sounds: Normal breath sounds. No wheezing.  Abdominal:     General: Bowel sounds are normal. There is no distension.     Palpations: Abdomen is soft. There is no mass.     Tenderness: There is no guarding or rebound.     Comments: Abdominal distention noted.  No significant tenderness.  No rigidity or guarding  Musculoskeletal:        General: No tenderness. Normal range of motion.     Cervical back: Normal range of motion and neck supple.  Skin:    General: Skin is warm.  Neurological:     Mental Status: She is alert and oriented to person, place, and time.  Psychiatric:        Mood and Affect: Affect normal.      LABORATORY DATA:  I have reviewed the data as listed    Component Value Date/Time   NA 137 10/17/2020 0616   NA 142 05/29/2020 1344   K 4.8 10/17/2020 0616   CL 110 10/17/2020 0616   CO2 23 10/17/2020 0616   GLUCOSE 111 (H) 10/17/2020 0616   GLUCOSE 134 (H) 03/01/2013 1414   BUN 32 (H) 10/17/2020 0616   BUN 9 05/29/2020 1344   CREATININE 0.83 10/17/2020 0616   CREATININE 0.63 08/08/2020 0000   CALCIUM 9.0 10/17/2020 0616   PROT 6.1 (L) 10/14/2020 1448   PROT 6.6 05/29/2020 1344   PROT 7.5 03/02/2014 1034   ALBUMIN 2.7 (L) 10/14/2020 1448   ALBUMIN 3.9 05/29/2020 1344   ALBUMIN 3.5 03/02/2014 1034   AST 30 10/14/2020 1448   AST 13 (L) 03/02/2014 1034   ALT 20 10/14/2020 1448   ALT 21 03/02/2014 1034   ALKPHOS 92 10/14/2020 1448   ALKPHOS 76 03/02/2014 1034   BILITOT 0.8 10/14/2020 1448   BILITOT 0.4 05/29/2020 1344   BILITOT 0.2 03/02/2014 1034   GFRNONAA >60 10/17/2020 0616   GFRNONAA 89 08/08/2020 0000   GFRAA 103 08/08/2020 0000    No results found for: SPEP, UPEP  Lab Results  Component Value Date   WBC 6.7 10/17/2020   NEUTROABS 13.1 (H) 10/14/2020   HGB 9.0 (L) 10/17/2020   HCT 28.5 (L) 10/17/2020   MCV 82.8 10/17/2020   PLT 33 (L) 10/17/2020      Chemistry       Component Value Date/Time  NA 137 10/17/2020 0616   NA 142 05/29/2020 1344   K 4.8 10/17/2020 0616   CL 110 10/17/2020 0616   CO2 23 10/17/2020 0616   BUN 32 (H) 10/17/2020 0616   BUN 9 05/29/2020 1344   CREATININE 0.83 10/17/2020 0616   CREATININE 0.63 08/08/2020 0000      Component Value Date/Time   CALCIUM 9.0 10/17/2020 0616   ALKPHOS 92 10/14/2020 1448   ALKPHOS 76 03/02/2014 1034   AST 30 10/14/2020 1448   AST 13 (L) 03/02/2014 1034   ALT 20 10/14/2020 1448   ALT 21 03/02/2014 1034   BILITOT 0.8 10/14/2020 1448   BILITOT 0.4 05/29/2020 1344   BILITOT 0.2 03/02/2014 1034       RADIOGRAPHIC STUDIES: I have personally reviewed the radiological images as listed and agreed with the findings in the report. No results found.   ASSESSMENT & PLAN:  Carcinoma of unknown primary ([place]) #Clinically metastatic/stage IV carcinoma of pancreas-poorly differentiated adenocarcinoma; ER/PR-HER-2/neu negative.   #Patient status post 1 cycle of carbotaxol chemotherapy approximately 10 days ago; however patient has had poor tolerance of therapy/2 admission to the hospital the last 10 days.  #I would recommend hospice given patient's significant decline in the last few weeks.  I think patient is extremely at high risk of side effects from any further chemotherapy.  I discussed the hospice philosophy-with the daughter/and the patient at length.  Then agreement.  I also introduce Josh; from palliative care to help transition the care.  #Abdominal distention-likely malignant ascites; symptomatic.  Recommend paracentesis.  #History of portal vein thrombosis-currently on hold because of recent thrombocytopenia platelets 33; I think it is reasonable to hold off any further anticoagulation given patient's extreme poor prognosis.  #Hypotension-likely secondary to poor p.o. intake.;  Difficult IV stick.  Hold off any IV fluids.  Transition to hospice.  Discussed with [employer].  I offered to  speak to patient's other daughter.  They declined.   # DISPOSITION: # hospice referral ASAP # follow up as needed-dr.B  # 40 minutes face-to-face with the patient discussing the above plan of care; more than 50% of time spent on prognosis/ natural history; counseling and coordination.    No orders of the defined types were placed in this encounter.  All questions were answered. The patient knows to call the clinic with any problems, questions or concerns.      Cammie Sickle, MD 10/21/2020 4:03 PM

## 2020-10-21 NOTE — Chronic Care Management (AMB) (Signed)
  Chronic Care Management   Social Work Note  10/21/2020 Name: Theresa Andrews MRN: [id number] DOB: 1946-05-19  Theresa Andrews is a 75 y.o. year old female who sees Steele Sizer, MD for primary care. The CCM team was consulted for assistance with Caregiver Stress.   Phone call to patient's daughter to assess for community resource needs and support. Per patient's daughter, patient's medical condition has progressed and she has now been referred for hospice care.  SDOH (Social Determinants of Health) assessments performed: No     Outpatient Encounter Medications as of 10/21/2020  Medication Sig Note  . aspirin EC 81 MG EC tablet Take 1 tablet (81 mg total) by mouth daily. Swallow whole.   . cholecalciferol (VITAMIN D) 1000 units tablet Take 1 tablet (1,000 Units total) by mouth daily.   . feeding supplement (ENSURE ENLIVE / ENSURE PLUS) LIQD Take 237 mLs by mouth 2 (two) times daily between meals.   . ferrous sulfate 325 (65 FE) MG tablet Take 1 tablet (325 mg total) by mouth daily.   . Lactulose 20 GM/30ML SOLN Take 30 mLs (20 g total) by mouth daily as needed.   Marland Kitchen LORazepam (ATIVAN) 0.5 MG tablet Take 1 tablet (0.5 mg total) by mouth every 8 (eight) hours as needed for anxiety.   . metoprolol succinate (TOPROL-XL) 25 MG 24 hr tablet Take 0.5 tablets (12.5 mg total) by mouth daily. 10/15/2020: Hold per doctor  . ondansetron (ZOFRAN) 4 MG tablet Take 1 tablet (4 mg total) by mouth daily as needed for nausea or vomiting.   Marland Kitchen oxyCODONE-acetaminophen (PERCOCET) 10-325 MG tablet Take 1 tablet by mouth every 4 (four) hours as needed for pain.   Marland Kitchen PARoxetine (PAXIL) 10 MG tablet Take 1 tablet (10 mg total) by mouth daily.   . polyethylene glycol (MIRALAX / GLYCOLAX) 17 g packet Take 17 g by mouth 2 (two) times daily as needed.   . rosuvastatin (CRESTOR) 10 MG tablet Take 1 tablet (10 mg total) by mouth daily.   . [DISCONTINUED] oxyCODONE-acetaminophen (PERCOCET) 10-325 MG tablet Take 1 tablet by  mouth every 6 (six) hours as needed for pain.    No facility-administered encounter medications on file as of 10/21/2020.    Goals Addressed   None     Follow Up Plan: Client will be followed by hospice at this time  Elliot Gurney, [place] Worker  [facility] Care Management [phone]

## 2020-10-21 NOTE — Assessment & Plan Note (Addendum)
#  Clinically metastatic/stage IV carcinoma of pancreas-poorly differentiated adenocarcinoma; ER/PR-HER-2/neu negative.   #Patient status post 1 cycle of carbotaxol chemotherapy approximately 10 days ago; however patient has had poor tolerance of therapy/2 admission to the hospital the last 10 days.  #I would recommend hospice given patient's significant decline in the last few weeks.  I think patient is extremely at high risk of side effects from any further chemotherapy.  I discussed the hospice philosophy-with the daughter/and the patient at length.  Then agreement.  I also introduce Josh; from palliative care to help transition the care.  #Abdominal distention-likely malignant ascites; symptomatic.  Recommend paracentesis.  #History of portal vein thrombosis-currently on hold because of recent thrombocytopenia platelets 33; I think it is reasonable to hold off any further anticoagulation given patient's extreme poor prognosis.  #Hypotension-likely secondary to poor p.o. intake.;  Difficult IV stick.  Hold off any IV fluids.  Transition to hospice.  Discussed with [employer].  I offered to speak to patient's other daughter.  They declined.   # DISPOSITION: # hospice referral ASAP # follow up as needed-dr.B  # 40 minutes face-to-face with the patient discussing the above plan of care; more than 50% of time spent on prognosis/ natural history; counseling and coordination.

## 2020-10-22 ENCOUNTER — Telehealth: Payer: Self-pay | Admitting: *Deleted

## 2020-10-22 ENCOUNTER — Telehealth: Payer: Self-pay | Admitting: Family Medicine

## 2020-10-22 ENCOUNTER — Telehealth: Payer: Self-pay

## 2020-10-22 NOTE — Telephone Encounter (Signed)
Copied from [place] [phone]. Topic: General - Call Back - No Documentation >> Oct 22, 2020  1:27 PM Erick Blinks wrote: Reason for CRM: Pt's daughter called reporting that there was a missing page for [facility] paperwork that was faxed back yesterday. Says this is time sensitive, needs this asap.   Best contact: [phone]

## 2020-10-22 NOTE — Telephone Encounter (Signed)
Call from Hospice stating that the family called them asking for services. Asking if Dr B is in agreement and if he will sign orders

## 2020-10-22 NOTE — Telephone Encounter (Signed)
I gave hospice an order yesterday. They are seeing the patient this afternoon.

## 2020-10-22 NOTE — Telephone Encounter (Signed)
Katina from [facility] called to advise DR. Sowles that family was referring pt to Hospice and Alwyn Ren wanted to see if Dr. Ancil Boozer would still be attending Physician and attending while in hospice / please advise

## 2020-10-23 ENCOUNTER — Ambulatory Visit: Payer: Medicare PPO

## 2020-10-23 ENCOUNTER — Ambulatory Visit
Admission: RE | Admit: 2020-10-23 | Discharge: 2020-10-23 | Disposition: A | Discharge status: Home / Self Care | Source: Ambulatory Visit | Attending: Hospice and Palliative Medicine | Admitting: Hospice and Palliative Medicine

## 2020-10-23 ENCOUNTER — Other Ambulatory Visit: Payer: Self-pay

## 2020-10-23 ENCOUNTER — Telehealth: Payer: Self-pay | Admitting: *Deleted

## 2020-10-23 DIAGNOSIS — I10 Essential (primary) hypertension: Secondary | ICD-10-CM

## 2020-10-23 DIAGNOSIS — F32A Depression, unspecified: Secondary | ICD-10-CM

## 2020-10-23 DIAGNOSIS — E782 Mixed hyperlipidemia: Secondary | ICD-10-CM

## 2020-10-23 DIAGNOSIS — Z170 Estrogen receptor positive status [ER+]: Secondary | ICD-10-CM

## 2020-10-23 DIAGNOSIS — D649 Anemia, unspecified: Secondary | ICD-10-CM

## 2020-10-23 DIAGNOSIS — R188 Other ascites: Secondary | ICD-10-CM | POA: Diagnosis not present

## 2020-10-23 DIAGNOSIS — I214 Non-ST elevation (NSTEMI) myocardial infarction: Secondary | ICD-10-CM | POA: Diagnosis not present

## 2020-10-23 DIAGNOSIS — C801 Malignant (primary) neoplasm, unspecified: Secondary | ICD-10-CM | POA: Insufficient documentation

## 2020-10-23 DIAGNOSIS — C50812 Malignant neoplasm of overlapping sites of left female breast: Secondary | ICD-10-CM

## 2020-10-23 NOTE — Telephone Encounter (Signed)
Katina's number is [phone]

## 2020-10-23 NOTE — Telephone Encounter (Signed)
Theresa Andrews just wanted to verify that she e-mail the missing page this am and she would like a cb that all is now received [phone]

## 2020-10-23 NOTE — Procedures (Signed)
Interventional Radiology Procedure Note  Procedure: Paracentesis  Indication: Recurrent ascites  Findings: Please refer to procedural dictation for full description.  Complications: None  EBL: < 10 mL  Miachel Roux, MD [phone]

## 2020-10-23 NOTE — Telephone Encounter (Signed)
Spokw with patient's daughters and they would like Hospice MD to assume care with Dr.Sowles being updated.

## 2020-10-23 NOTE — Progress Notes (Signed)
Chronic Care Management Pharmacy Note  10/23/2020 Name:  Theresa Andrews MRN:  [id number] DOB:  January 30, 1946  Subjective: Theresa Andrews is an 75 y.o. year old female who is a primary patient of Steele Sizer, MD.  The CCM team was consulted for assistance with disease management and care coordination needs.    Engaged with patient by telephone for initial visit in response to provider referral for pharmacy case management and/or care coordination services.   Consent to Services:  The patient was given the following information about Chronic Care Management services today, agreed to services, and gave verbal consent: 1. CCM service includes personalized support from designated clinical staff supervised by the primary care provider, including individualized plan of care and coordination with other care providers 2. 24/7 contact phone numbers for assistance for urgent and routine care needs. 3. Service will only be billed when office clinical staff spend 20 minutes or more in a month to coordinate care. 4. Only one practitioner may furnish and bill the service in a calendar month. 5.The patient may stop CCM services at any time (effective at the end of the month) by phone call to the office staff. 6. The patient will be responsible for cost sharing (co-pay) of up to 20% of the service fee (after annual deductible is met). Patient agreed to services and consent obtained.  Patient Care Team: Steele Sizer, MD as PCP - General (Family Medicine) Cammie Sickle, MD as Consulting Physician (Oncology) Corey Skains, MD as Consulting Physician (Cardiology) Solum, Betsey Holiday, MD as Consulting Physician (Endocrinology) Baxter Kail, MD as Consulting Physician (Dermatology) Anell Barr, OD as Consulting Physician (Optometry) Pieter Partridge, MD as Referring Physician (Dermatology) Vern Claude, LCSW as Social Worker Neldon Labella, RN as Registered Nurse Germaine Pomfret, [facility]  (Pharmacist)  Recent office visits: 10/01/20: Patient presented to Dr. Ancil Boozer for hospital follow-up. xarelto stopped and switched to Lovenox due to progression of portal vein thrombosis.  09/04/20: Rosuvastatin increased to 10 mg daily   Recent consult visits: NA  Hospital visits: 3/22: Patient hospitalized for NSTEMI. Patient discharged with hospice. Lovenox stopped. BP held due to hypotension.  3/16-3/19/22: Patient hospitalized for abdominal pain  Objective:  Lab Results  Component Value Date   CREATININE 0.83 10/17/2020   BUN 32 (H) 10/17/2020   GFRNONAA >60 10/17/2020   GFRAA 103 08/08/2020   NA 137 10/17/2020   K 4.8 10/17/2020   CALCIUM 9.0 10/17/2020   CO2 23 10/17/2020   GLUCOSE 111 (H) 10/17/2020    Lab Results  Component Value Date/Time   HGBA1C 6.1 (H) 08/08/2020 12:00 AM   HGBA1C 6.3 (H) 07/05/2019 12:00 AM   MICROALBUR 1.0 08/09/2020 04:14 PM   MICROALBUR 11.9 11/14/2019 10:05 AM    Last diabetic Eye exam: No results found for: HMDIABEYEEXA  Last diabetic Foot exam: No results found for: HMDIABFOOTEX   Lab Results  Component Value Date   CHOL 130 08/08/2020   HDL 40 (L) 08/08/2020   LDLCALC 76 08/08/2020   TRIG 68 08/08/2020   CHOLHDL 3.3 08/08/2020    Hepatic Function Latest Ref Rng & Units 10/14/2020 10/09/2020 10/06/2020  Total Protein 6.5 - 8.1 g/dL 6.1(L) 6.8 7.0  Albumin 3.5 - 5.0 g/dL 2.7(L) 3.0(L) 3.0(L)  AST 15 - 41 U/L 30 25 28   ALT 0 - 44 U/L 20 13 12   Alk Phosphatase 38 - 126 U/L 92 97 93  Total Bilirubin 0.3 - 1.2 mg/dL 0.8 0.6 0.9  Bilirubin,  Direct 0.1 - 0.5 mg/dL - - -    Lab Results  Component Value Date/Time   TSH 3.59 12/10/2016 10:59 AM   TSH 5.476 (H) 12/02/2016 09:59 AM   FREET4 1.1 12/10/2016 10:59 AM    CBC Latest Ref Rng & Units 10/17/2020 10/16/2020 10/15/2020  WBC 4.0 - 10.5 K/uL 6.7 8.2 11.4(H)  Hemoglobin 12.0 - 15.0 g/dL 9.0(L) 9.5(L) 9.9(L)  Hematocrit 36.0 - 46.0 % 28.5(L) 30.7(L) 31.0(L)  Platelets 150 - 400  K/uL 33(L) 38(L) PLATELET CLUMPS NOTED ON SMEAR, UNABLE TO ESTIMATE    Lab Results  Component Value Date/Time   VD25OH 36 08/08/2020 12:00 AM   VD25OH 19 (L) 07/05/2019 12:00 AM    Clinical ASCVD: Yes  The ASCVD Risk score Mikey Bussing DC Jr., et al., 2013) failed to calculate for the following reasons:   The patient has a prior MI or stroke diagnosis    Depression screen [facility] 2/9 10/01/2020 09/19/2020 01/05/2020  Decreased Interest 3 0 0  Down, Depressed, Hopeless 3 0 0  PHQ - 2 Score 6 0 0  Altered sleeping 3 - 0  Tired, decreased energy 3 - 0  Change in appetite 3 - 1  Feeling bad or failure about yourself  0 - 0  Trouble concentrating 3 - 0  Moving slowly or fidgety/restless 0 - 0  Suicidal thoughts 0 - 0  PHQ-9 Score 18 - 1  Difficult doing work/chores - - Not difficult at all  Some recent data might be hidden     Social History   Tobacco Use  Smoking Status Former Smoker  . Packs/day: 0.25  . Years: 25.00  . Pack years: 6.25  . Types: Cigarettes  . Quit date: 2008  . Years since quitting: 14.2  Smokeless Tobacco Never Used  Tobacco Comment   smoking cessation materials not required   BP Readings from Last 3 Encounters:  10/21/20 (!) 89/69  10/17/20 104/63  10/14/20 106/80   Pulse Readings from Last 3 Encounters:  10/21/20 (!) 108  10/17/20 87  10/14/20 85   Wt Readings from Last 3 Encounters:  10/21/20 161 lb (73 kg)  10/17/20 157 lb 9.6 oz (71.5 kg)  10/14/20 156 lb (70.8 kg)   BMI Readings from Last 3 Encounters:  10/21/20 27.64 kg/m  10/17/20 27.05 kg/m  10/14/20 26.78 kg/m    Assessment/Interventions: Review of patient past medical history, allergies, medications, health status, including review of consultants reports, laboratory and other test data, was performed as part of comprehensive evaluation and provision of chronic care management services.   SDOH:  (Social Determinants of Health) assessments and interventions performed: Yes   CCM Care  Plan  Allergies  Allergen Reactions  . Tetanus Toxoids Shortness Of Breath and Rash  . Shellfish Allergy     Medications Reviewed Today    Reviewed by Clemencia Course, CPhT (Pharmacy Technician) on 10/15/20 at [place] List Status: Complete  Medication Order Taking? Sig Documenting Provider Last Dose Status Informant  amLODipine (NORVASC) 10 MG tablet [id number] No Take 1 tablet (10 mg total) by mouth daily. Steele Sizer, MD unknown unknown Active Family Member           Med Note Clemencia Course   Tue Oct 15, 2020  6:53 PM) Hold per doctor  enoxaparin (LOVENOX) 80 MG/0.8ML injection [id number] Yes Inject 80 mg into the skin every 12 (twelve) hours. [provider] 10/14/2020 Unknown time Active Family Member  feeding supplement (ENSURE ENLIVE / ENSURE PLUS) LIQD  [id number] Yes Take 237 mLs by mouth 2 (two) times daily between meals. Nicole Kindred A, DO 10/15/2020 Unknown time Active Family Member  Lactulose 20 GM/30ML SOLN [id number] Yes Take 30 mLs (20 g total) by mouth daily as needed. Sharen Hones, MD prn prn Active Family Member  metoprolol succinate (TOPROL-XL) 25 MG 24 hr tablet [id number] No Take 0.5 tablets (12.5 mg total) by mouth daily. Steele Sizer, MD unknown unknown Active Family Member           Med Note Luan Pulling, [facility]   Tue Oct 15, 2020  6:53 PM) Hold per doctor  ondansetron (ZOFRAN) 4 MG tablet [id number] Yes Take 1 tablet (4 mg total) by mouth daily as needed for nausea or vomiting. Sharen Hones, MD prn prn Active Family Member  oxyCODONE-acetaminophen (PERCOCET) 10-325 MG tablet [id number] Yes Take 1 tablet by mouth every 6 (six) hours as needed for pain. Sharen Hones, MD prn prn Active Family Member  PARoxetine (PAXIL) 10 MG tablet [id number] Yes Take 1 tablet (10 mg total) by mouth daily. Steele Sizer, MD 10/15/2020 1000 Active Family Member  potassium chloride SA (KLOR-CON) 20 MEQ tablet [id number] Yes 1 pill twice a day  Patient taking differently:  Take 20 mEq by mouth 2 (two) times daily. 1 pill twice a day   Cammie Sickle, MD 10/15/2020 1000 Active Family Member  rosuvastatin (CRESTOR) 10 MG tablet [id number] Yes Take 1 tablet (10 mg total) by mouth daily. Steele Sizer, MD 10/15/2020 1000 Active Family Member  telmisartan (MICARDIS) 80 MG tablet [id number] No One daily Steele Sizer, MD unknown unknown Active Family Member           Med Note Clemencia Course   Tue Oct 15, 2020  6:53 PM) Hold per doctor          Patient Active Problem List   Diagnosis Date Noted  . NSTEMI (non-ST elevated myocardial infarction) ([place]) 10/15/2020  . Depression   . Acute metabolic encephalopathy 25/49/8264  . Palliative care encounter   . Malignant ascites 10/09/2020  . Carcinoma of unknown primary ([place]) 10/09/2020  . Portal vein thrombosis   . Abdominal pain 10/03/2020  . Lesion of liver 03/11/2020  . Bradycardia 10/04/2018  . Obesity (BMI 30-39.9) 05/12/2018  . Statin myopathy 05/12/2018  . Vitamin D deficiency 01/09/2018  . Hyperparathyroidism ([place]) 01/07/2018  . Carcinoma of overlapping sites of left breast in female, estrogen receptor positive ([place]) 03/27/2016  . Hypercalcemia 08/20/2015  . Diuretic-induced hypokalemia 04/29/2015  . Abnormal finding on thyroid function test 10/29/2014  . Anxiety disorder 10/29/2014  . Breast CA ([place]) 10/29/2014  . Atherosclerosis of coronary artery 10/29/2014  . Prediabetes 10/29/2014  . Gravida 1 10/29/2014  . Hyperlipidemia 10/29/2014  . Disorder of peripheral nervous system 10/29/2014  . At risk for falling 10/29/2014  . Neoplasm of skin 10/29/2014  . Benign essential HTN 04/25/2014    Immunization History  Administered Date(s) Administered  . Influenza, High Dose Seasonal PF 04/29/2015, 05/08/2016, 04/06/2017, 05/02/2018, 04/14/2019  . Influenza-Unspecified 05/09/2014, 05/02/2018, 04/19/2020  . PFIZER(Purple Top)SARS-COV-2 Vaccination 09/20/2019, 10/11/2019, 06/24/2020  .  Pneumococcal Conjugate-13 05/09/2014  . Pneumococcal Polysaccharide-23 05/08/2016  . Zoster 07/27/2012    Conditions to be addressed/monitored:  Hypertension, Hyperlipidemia, Coronary Artery Disease and Anxiety  There are no care plans that you recently modified to display for this patient.    Medication Assistance: None required.  Patient affirms current coverage meets needs.  Patient's preferred pharmacy is:  [facility] ([facility] [place] -  [address] ([place]) [place] [address] Phone: [phone] Fax: [phone]  Uses pill box? Yes Pt endorses 100% compliance  We discussed: Current pharmacy is preferred with insurance plan and patient is satisfied with pharmacy services Patient decided to: Continue current medication management strategy  Care Plan and Follow Up Patient Decision:  Patient requests no follow-up at this time.  Plan: The patient has been provided with contact information for the care management team and has been advised to call with any health related questions or concerns.  and No further follow up required: The patient has been discharged to hospice care   Doristine Section, [facility] [phone]

## 2020-10-23 NOTE — Telephone Encounter (Signed)
Daughter called asking if her FMLA has been completed and sent yet. Please return her call.

## 2020-10-23 NOTE — Telephone Encounter (Signed)
Spoke with Mickel Baas, form filled out and faxed back.

## 2020-10-23 NOTE — Telephone Encounter (Signed)
Left voicemail at [facility] for return call regarding Attending wishes.

## 2020-10-24 ENCOUNTER — Telehealth: Payer: Self-pay | Admitting: Hospice and Palliative Medicine

## 2020-10-24 MED ORDER — MORPHINE SULFATE ER 15 MG PO TBCR: 15.0000 mg | EXTENDED_RELEASE_TABLET | Freq: Two times a day (BID) | ORAL | 0 refills | Status: AC

## 2020-10-24 NOTE — Telephone Encounter (Signed)
FMLA paperwork faxed. Spoke with daughter in regards. While speaking with daughter she stated that patient was still in a lot of pain even when taking oxycodone q4h as prescribed.   Josh-please advise

## 2020-10-24 NOTE — Telephone Encounter (Signed)
Spoke with Merrily Pew in regards to daughters concern about patients pain. He will follow up with hospice.

## 2020-10-24 NOTE — Telephone Encounter (Signed)
I spoke with patient's hospice nurse.  Patient has had worse generalized abdominal pain this week.  She is now taking Percocet 1 tablet every 4 hours around-the-clock and finds it short-lived.  No nausea or vomiting.  She is having regular bowel movements.  She had a paracentesis earlier this week with 3 L fluid removed.  We will start patient on MS Contin 15 mg every 12 hours with plan to increase to every 8 hour dosing if needed.

## 2020-10-25 ENCOUNTER — Telehealth: Payer: Self-pay | Admitting: *Deleted

## 2020-10-25 NOTE — Telephone Encounter (Signed)
I returned her call and left a voicemail.

## 2020-10-25 NOTE — Telephone Encounter (Signed)
Josh - please advise. Patient just had ultrasound para drawn this week. She may not have enough fluid to take off for at least at week or so. They only plan these on Friday.

## 2020-10-25 NOTE — Telephone Encounter (Signed)
Lattie Haw called asking that patient get the catheter out in that she can drain the fluid off her stomach at home. (Pleurex ???)

## 2020-10-25 NOTE — Telephone Encounter (Signed)
I called and spoke with patient's daughter by phone.  We discussed the role of an indwelling peritoneal catheter for ascites management including risks and benefits.  Patient just had paracentesis with 3 L fluid removed.  It is unlikely that she has had reaccumulation yet to justify indwelling catheter.  Will speak with hospice and monitor.  Daughter was comfortable with waiting.

## 2020-10-25 NOTE — Telephone Encounter (Signed)
Deandra called reporting that patient has not started her MS Contin yet, The patient was not in any pain and was very sleepy from Lorazepam that her daughter had given her. She wanted to update Josh about this

## 2020-10-30 ENCOUNTER — Ambulatory Visit: Payer: Medicare PPO

## 2020-10-30 DIAGNOSIS — I10 Essential (primary) hypertension: Secondary | ICD-10-CM | POA: Diagnosis not present

## 2020-10-30 DIAGNOSIS — C801 Malignant (primary) neoplasm, unspecified: Secondary | ICD-10-CM

## 2020-10-30 NOTE — Chronic Care Management (AMB) (Signed)
  Chronic Care Management     10/30/2020 Name: KELTY SZAFRAN MRN: [id number] DOB: 04-08-1946   Primary Care Provider: Steele Sizer, MD Reason for referral : Chronic Care Management   Theresa Andrews is a 75 y.o. year old female who is a primary care patient of Steele Sizer, MD. The CCM team was consulted for assistance with care coordination needs related to Polypharmacy and Caregiver Stress.  Review of Ms. Kapral' status, including review of consultants reports, relevant labs and test results was conducted today. Collaboration with appropriate care team members was performed as part of the comprehensive evaluation and provision of chronic care management services.      SDOH (Social Determinants of Health) assessments performed: Yes See Care Plan activities for detailed interventions related to SDOH  SDOH Interventions   Flowsheet Row Most Recent Value  SDOH Interventions   Food Insecurity Interventions Intervention Not Indicated  Transportation Interventions Intervention Not Indicated        Medications: Outpatient Encounter Medications as of 10/30/2020  Medication Sig Note  . aspirin EC 81 MG EC tablet Take 1 tablet (81 mg total) by mouth daily. Swallow whole.   . cholecalciferol (VITAMIN D) 1000 units tablet Take 1 tablet (1,000 Units total) by mouth daily. (Patient not taking: No sig reported)   . feeding supplement (ENSURE ENLIVE / ENSURE PLUS) LIQD Take 237 mLs by mouth 2 (two) times daily between meals.   . ferrous sulfate 325 (65 FE) MG tablet Take 1 tablet (325 mg total) by mouth daily.   . Lactulose 20 GM/30ML SOLN Take 30 mLs (20 g total) by mouth daily as needed.   . metoprolol succinate (TOPROL-XL) 25 MG 24 hr tablet Take 0.5 tablets (12.5 mg total) by mouth daily. (Patient not taking: No sig reported) 10/15/2020: Hold per doctor  . morphine (MS CONTIN) 15 MG 12 hr tablet Take 1 tablet (15 mg total) by mouth every 12 (twelve) hours.   Marland Kitchen oxyCODONE-acetaminophen  (PERCOCET) 10-325 MG tablet Take 1 tablet by mouth every 4 (four) hours as needed for pain.   Marland Kitchen PARoxetine (PAXIL) 10 MG tablet Take 1 tablet (10 mg total) by mouth daily.   . polyethylene glycol (MIRALAX / GLYCOLAX) 17 g packet Take 17 g by mouth 2 (two) times daily as needed. (Patient not taking: Reported on 10/23/2020)   . rosuvastatin (CRESTOR) 10 MG tablet Take 1 tablet (10 mg total) by mouth daily.    No facility-administered encounter medications on file as of 10/30/2020.        PLAN Brief outreach with Ms. Prue' daughter/caregiver Lattie Haw. Ms. Foskey has transitioned to hospice. She has established care with [facility] and currently receiving nursing visits three times a week. Family members are also assisting with Ms. Hulett' care. No further nursing outreach required at this time. Lattie Haw agreed to call with questions if needed.   C[facility] Care Management [facility] [phone]

## 2020-11-01 ENCOUNTER — Inpatient Hospital Stay: Payer: Medicare PPO

## 2020-11-02 ENCOUNTER — Encounter: Payer: Self-pay | Admitting: Internal Medicine

## 2020-11-04 ENCOUNTER — Other Ambulatory Visit: Payer: Self-pay | Admitting: *Deleted

## 2020-11-04 ENCOUNTER — Telehealth: Payer: Self-pay | Admitting: Hospice and Palliative Medicine

## 2020-11-04 ENCOUNTER — Inpatient Hospital Stay: Payer: Medicare PPO | Attending: Internal Medicine

## 2020-11-04 DIAGNOSIS — R180 Malignant ascites: Secondary | ICD-10-CM

## 2020-11-04 NOTE — Telephone Encounter (Signed)
RN faxed u/s guided para worksheet to specialty schedulers.

## 2020-11-04 NOTE — Telephone Encounter (Signed)
I spoke with patient's hospice nurse who saw patient today in the home. Patient is having worsening adb distension concerning for recurrent ascites. Family had previously asked about option for placing an indwelling peritoneal catheter to allow management at home. Unfortunately, patient is actively declining with poor intake (only bites/sips) and worsening performance status. Now mostly in bed. Patient appears to be lasting about 3 weeks in between paracentesis. I doubt that she would receive much benefit from pleurx insertion and the risks would likely outweigh the benefit at this point.   After discussion with family, they are okay not pursuing pleurx. Will order US paracentesis.

## 2020-11-04 NOTE — Progress Notes (Addendum)
Nutrition Assessment   Reason for Assessment: Poor appetite   ASSESSMENT:  75 year old female with metastatic stage IV carcinoma of pancreas.  Past medical history of HTN, hyperglycemia, HLD, MI, CAD, depression.  Patient is being followed by hospice.  Daughter Lattie Haw wanting to speak with RD.  Called daughter.  Patient is mainly taking applesauce, and baby food, milkshakes.  Daughter says that patient stated she does not want to chew.  Does not like ensure/boost shakes.  Mainly taking bites and sips.     Medications: reviewed   Labs: reviewed   Anthropometrics:   Height: 64 inches Weight: 161 lb on 3/28 (fluid) BMI: 27    INTERVENTION:  Discussed option of blending, pureeing foods for patient for ease of chewing. Helped daughter brainstorm on foods to try. Encouraged extremely small servings for patient.  Liberalize diet. Offer foods when alert and safe to swallow. Contact information given to daughter Lattie Haw and daughter will reach out if needed   Next Visit: no follow-up.   Jeena Arnett B. Zenia Resides, [place], [place] Registered Dietitian [phone] (mobile)

## 2020-11-05 ENCOUNTER — Ambulatory Visit: Payer: Medicare PPO

## 2020-11-05 ENCOUNTER — Ambulatory Visit
Admission: RE | Admit: 2020-11-05 | Discharge: 2020-11-05 | Disposition: A | Discharge status: Home / Self Care | Payer: Medicare PPO | Source: Ambulatory Visit | Attending: Hospice and Palliative Medicine | Admitting: Hospice and Palliative Medicine

## 2020-11-05 ENCOUNTER — Other Ambulatory Visit: Payer: Self-pay

## 2020-11-05 DIAGNOSIS — R180 Malignant ascites: Secondary | ICD-10-CM | POA: Diagnosis not present

## 2020-11-05 DIAGNOSIS — R188 Other ascites: Secondary | ICD-10-CM | POA: Diagnosis not present

## 2020-11-05 DIAGNOSIS — C801 Malignant (primary) neoplasm, unspecified: Secondary | ICD-10-CM | POA: Diagnosis not present

## 2020-11-05 NOTE — Procedures (Signed)
PROCEDURE SUMMARY:  Successful US guided paracentesis from left lower abdomen.  Yielded 800 ml of clear yellow fluid.  No immediate complications.  Pt tolerated well.   EBL < 1 mL  Theresa Duty, NP 11/05/2020 2:23 PM

## 2020-11-12 ENCOUNTER — Telehealth: Payer: Self-pay | Admitting: Internal Medicine

## 2020-11-12 NOTE — Telephone Encounter (Signed)
On 4/19-I spoke to patient's daughter Lattie Haw and offered my condolences.  Family very thankful for the call. Appreciate the cancer center staff for the care and support provided to the patient.   GB

## 2020-11-24 DEATH — deceased

## 2020-12-06 ENCOUNTER — Other Ambulatory Visit: Payer: Medicare PPO

## 2020-12-06 ENCOUNTER — Ambulatory Visit: Payer: Medicare PPO | Admitting: Internal Medicine

## 2021-02-13 ENCOUNTER — Ambulatory Visit: Payer: Medicare PPO | Admitting: Internal Medicine

## 2021-02-13 ENCOUNTER — Other Ambulatory Visit: Payer: Medicare PPO

## 2021-07-30 NOTE — Telephone Encounter (Signed)
Error

## 2021-09-23 ENCOUNTER — Ambulatory Visit: Payer: Medicare PPO

## 2022-01-05 IMAGING — CT CT HEAD WO/W CM
3 of 4 series · 15 of 47 positions shown, 18 images · IV contrast (omnipaque)
Comparison: 10/03/2020

CLINICAL DATA: Mental status change

EXAM:
CT HEAD WITHOUT AND WITH CONTRAST
TECHNIQUE: Contiguous axial images were obtained from the base of the skull
through the vertex without and with intravenous contrast
CONTRAST:  100mL OMNIPAQUE IOHEXOL 300 MG/ML  SOLN

[Series 2: head wo · axial · 0.40mm/px · z∈[-254,-134]mm · 9 of 29 slices shown, 12 images]
[im 3/29  brain]
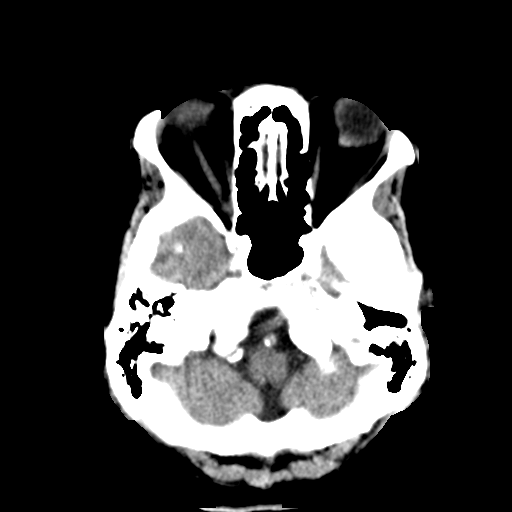
[im 3/29  bone]
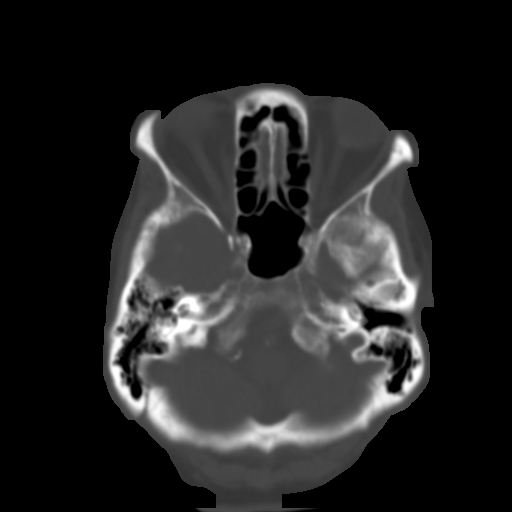
[im 7/29  brain]
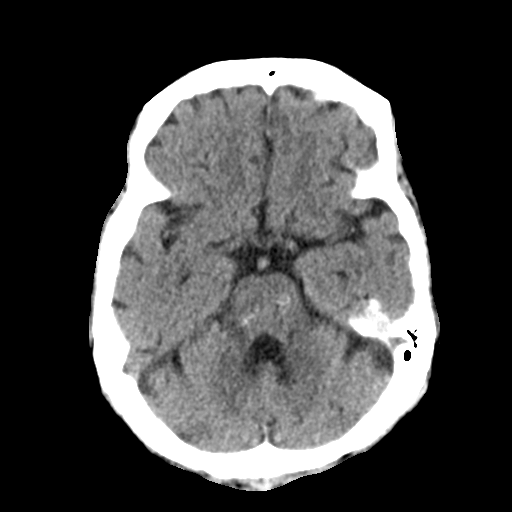
[im 9/29  brain]
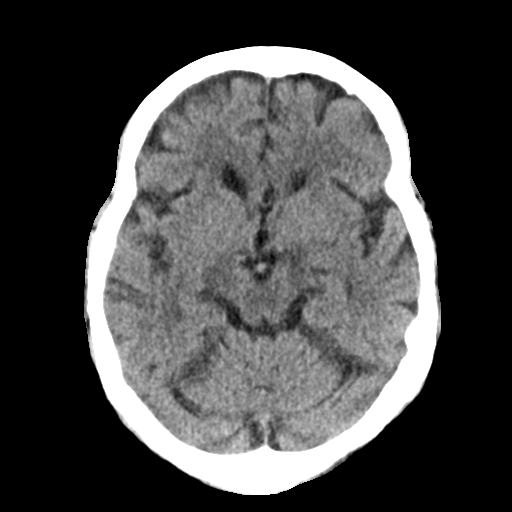
[im 13/29  brain]
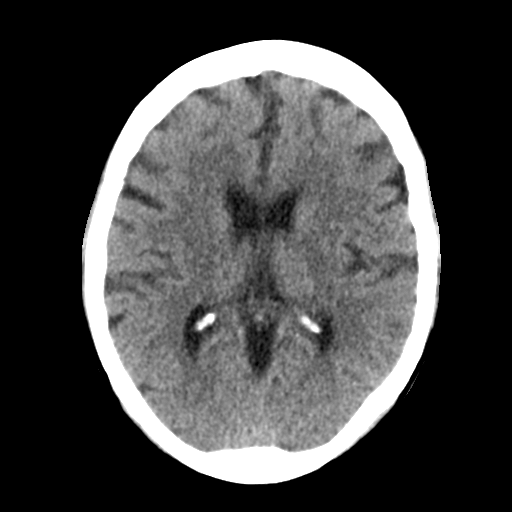
[im 15/29  brain]
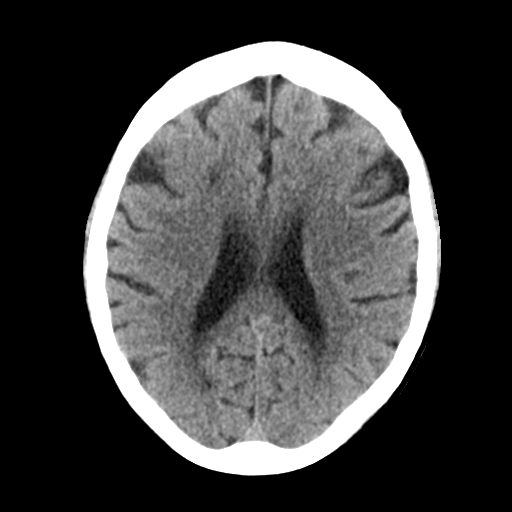
[im 15/29  bone]
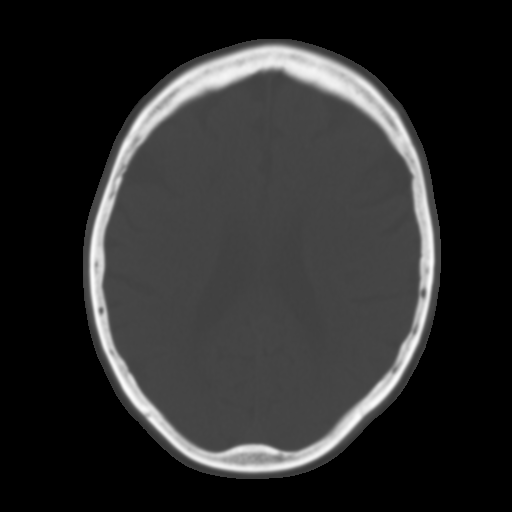
[im 17/29  brain]
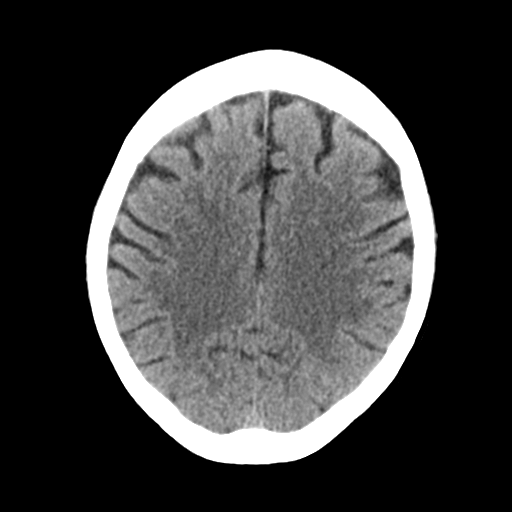
[im 21/29  brain]
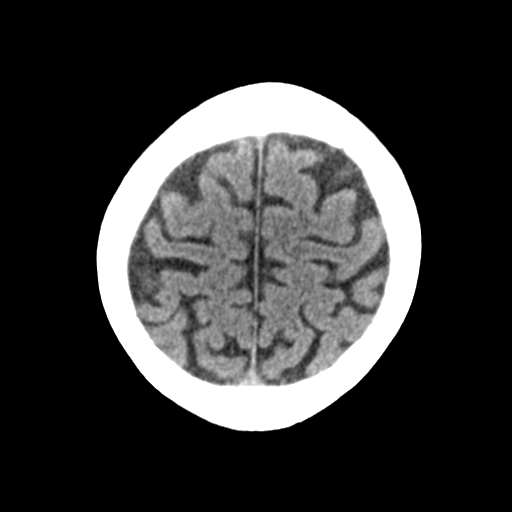
[im 23/29  brain]
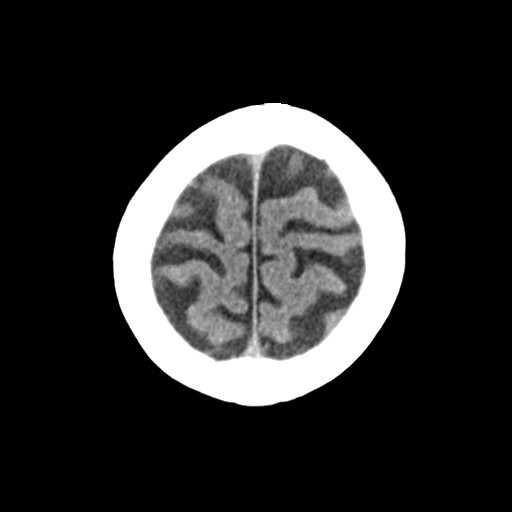
[im 27/29  brain]
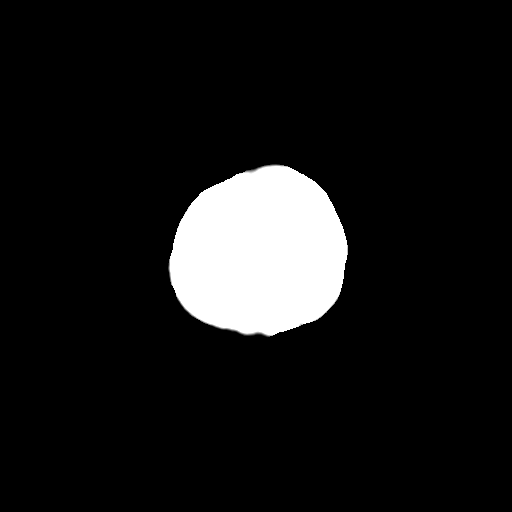
[im 27/29  bone]
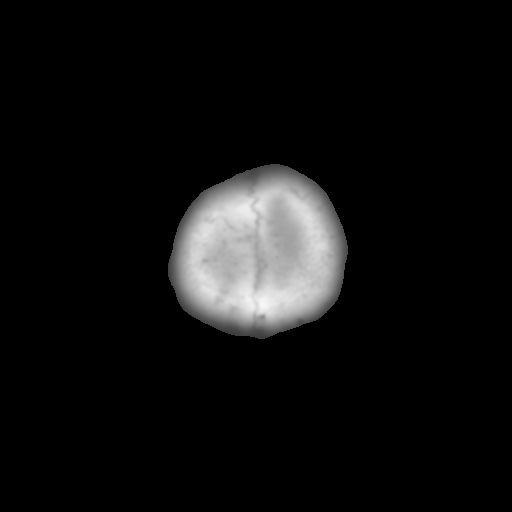

[Series 5: coronal soft tissue · coronal · 0.29mm/px · 3 of 67 slices shown]
[im 23/67  brain]
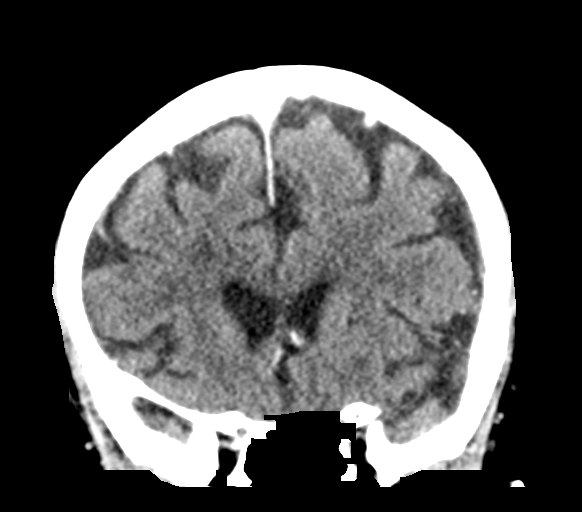
[im 30/67  brain]
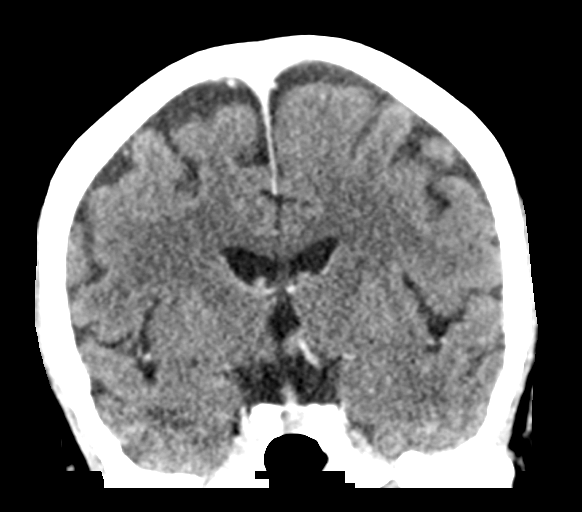
[im 37/67  brain]
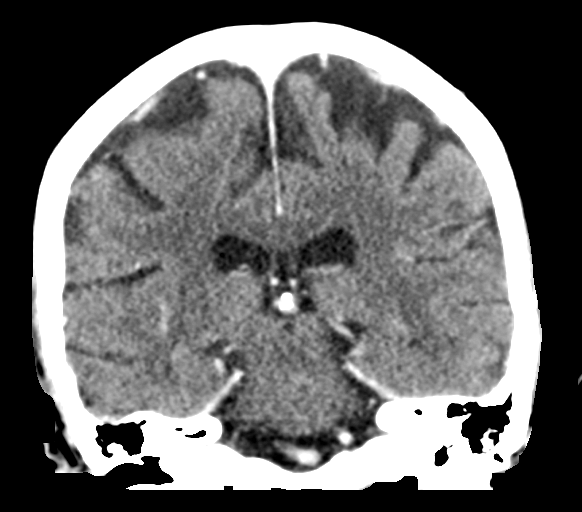

[Series 6: sagittal soft tissue · sagittal · 0.29mm/px · 3 of 52 slices shown]
[im 18/52  brain]
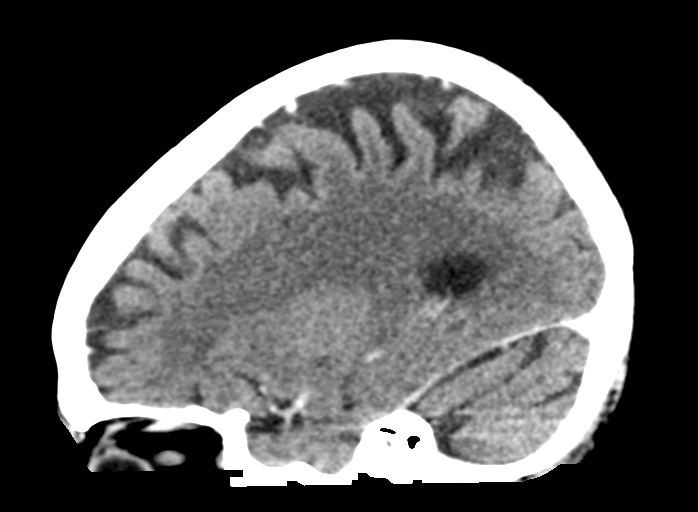
[im 26/52  brain]
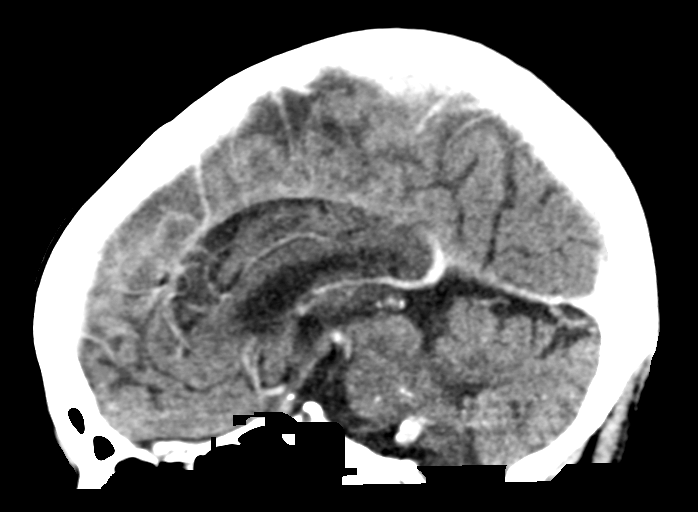
[im 35/52  brain]
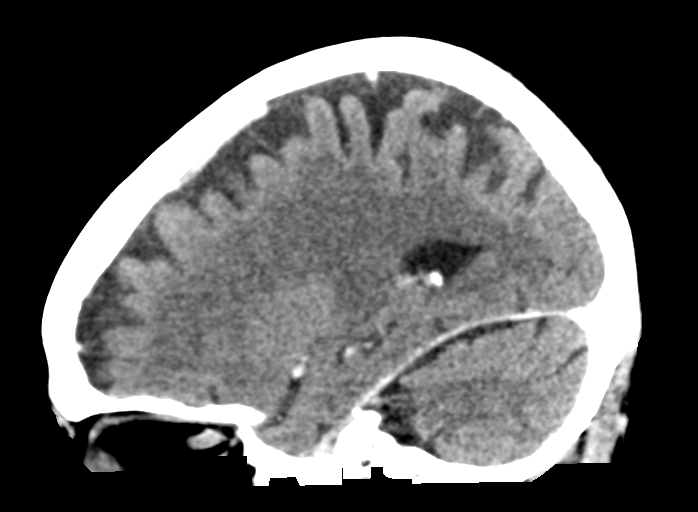

[15 of 47 positions shown; findings below may reference images not displayed]

FINDINGS: Brain: There is no acute intracranial hemorrhage, mass, mass effect,
or edema. No abnormal enhancement. Gray-white differentiation is
preserved. There is no extra-axial fluid collection. Ventricles and
sulci are stable in size and configuration. Patchy hypoattenuation
in the supratentorial white matter is nonspecific but likely
reflects stable mild chronic microvascular ischemic changes.

Vascular: There is atherosclerotic calcification at the skull base.

Skull: Calvarium is unremarkable.

Sinuses/Orbits: No acute finding.

Other: None.
IMPRESSION: No acute intracranial abnormality.  No abnormal enhancement.
